# Patient Record
Sex: Male | Born: 1960 | Race: Asian | Hispanic: No | Marital: Married | Smoking: Former smoker
Health system: Southern US, Community
[De-identification: ages and names within clinical notes are randomized; demographics above are authoritative.]

## PROBLEM LIST (undated history)

## (undated) DIAGNOSIS — K219 Gastro-esophageal reflux disease without esophagitis: Secondary | ICD-10-CM

## (undated) DIAGNOSIS — I2510 Atherosclerotic heart disease of native coronary artery without angina pectoris: Secondary | ICD-10-CM

## (undated) DIAGNOSIS — D72829 Elevated white blood cell count, unspecified: Secondary | ICD-10-CM

## (undated) DIAGNOSIS — J449 Chronic obstructive pulmonary disease, unspecified: Secondary | ICD-10-CM

## (undated) DIAGNOSIS — D509 Iron deficiency anemia, unspecified: Secondary | ICD-10-CM

## (undated) DIAGNOSIS — I255 Ischemic cardiomyopathy: Secondary | ICD-10-CM

## (undated) DIAGNOSIS — E041 Nontoxic single thyroid nodule: Secondary | ICD-10-CM

## (undated) DIAGNOSIS — J45909 Unspecified asthma, uncomplicated: Secondary | ICD-10-CM

## (undated) DIAGNOSIS — H269 Unspecified cataract: Secondary | ICD-10-CM

## (undated) DIAGNOSIS — I219 Acute myocardial infarction, unspecified: Secondary | ICD-10-CM

## (undated) DIAGNOSIS — E785 Hyperlipidemia, unspecified: Secondary | ICD-10-CM

## (undated) DIAGNOSIS — L03115 Cellulitis of right lower limb: Secondary | ICD-10-CM

## (undated) DIAGNOSIS — I10 Essential (primary) hypertension: Secondary | ICD-10-CM

## (undated) HISTORY — DX: Elevated white blood cell count, unspecified: D72.829

## (undated) HISTORY — DX: Nontoxic single thyroid nodule: E04.1

## (undated) HISTORY — DX: Hyperlipidemia, unspecified: E78.5

## (undated) HISTORY — DX: Ischemic cardiomyopathy: I25.5

## (undated) HISTORY — PX: CORONARY STENT PLACEMENT: SHX1402

## (undated) HISTORY — DX: Iron deficiency anemia, unspecified: D50.9

## (undated) HISTORY — PX: OTHER SURGICAL HISTORY: SHX169

## (undated) HISTORY — DX: Unspecified cataract: H26.9

## (undated) HISTORY — DX: Unspecified asthma, uncomplicated: J45.909

## (undated) HISTORY — DX: Chronic obstructive pulmonary disease, unspecified: J44.9

## (undated) HISTORY — DX: Gastro-esophageal reflux disease without esophagitis: K21.9

## (undated) HISTORY — DX: Atherosclerotic heart disease of native coronary artery without angina pectoris: I25.10

## (undated) HISTORY — DX: Essential (primary) hypertension: I10

---

## 2000-03-29 ENCOUNTER — Emergency Department (HOSPITAL_COMMUNITY): Admission: EM | Admit: 2000-03-29 | Discharge: 2000-03-29 | Payer: Self-pay | Admitting: Emergency Medicine

## 2000-03-29 ENCOUNTER — Encounter: Payer: Self-pay | Admitting: *Deleted

## 2004-04-24 ENCOUNTER — Ambulatory Visit: Payer: Self-pay | Admitting: Family Medicine

## 2004-05-28 ENCOUNTER — Ambulatory Visit: Payer: Self-pay | Admitting: Family Medicine

## 2004-06-17 ENCOUNTER — Ambulatory Visit: Payer: Self-pay | Admitting: Family Medicine

## 2004-06-21 ENCOUNTER — Ambulatory Visit (HOSPITAL_COMMUNITY): Admission: RE | Admit: 2004-06-21 | Discharge: 2004-06-21 | Payer: Self-pay | Admitting: Otolaryngology

## 2004-06-21 ENCOUNTER — Ambulatory Visit (HOSPITAL_COMMUNITY): Admission: RE | Admit: 2004-06-21 | Discharge: 2004-06-21 | Payer: Self-pay | Admitting: Family Medicine

## 2004-06-21 ENCOUNTER — Ambulatory Visit: Payer: Self-pay | Admitting: Family Medicine

## 2004-06-24 ENCOUNTER — Ambulatory Visit: Payer: Self-pay | Admitting: Family Medicine

## 2004-07-04 ENCOUNTER — Emergency Department (HOSPITAL_COMMUNITY): Admission: EM | Admit: 2004-07-04 | Discharge: 2004-07-04 | Payer: Self-pay | Admitting: Emergency Medicine

## 2004-07-09 ENCOUNTER — Ambulatory Visit: Payer: Self-pay | Admitting: Sports Medicine

## 2004-08-12 ENCOUNTER — Ambulatory Visit: Payer: Self-pay | Admitting: Sports Medicine

## 2004-08-27 ENCOUNTER — Emergency Department (HOSPITAL_COMMUNITY): Admission: EM | Admit: 2004-08-27 | Discharge: 2004-08-27 | Payer: Self-pay | Admitting: Emergency Medicine

## 2004-09-05 ENCOUNTER — Ambulatory Visit: Payer: Self-pay | Admitting: Family Medicine

## 2004-09-13 ENCOUNTER — Ambulatory Visit (HOSPITAL_COMMUNITY): Admission: RE | Admit: 2004-09-13 | Discharge: 2004-09-13 | Payer: Self-pay

## 2004-09-27 ENCOUNTER — Ambulatory Visit: Payer: Self-pay | Admitting: Family Medicine

## 2004-10-07 ENCOUNTER — Ambulatory Visit (HOSPITAL_COMMUNITY): Admission: RE | Admit: 2004-10-07 | Discharge: 2004-10-07 | Payer: Self-pay | Admitting: Sports Medicine

## 2004-10-10 ENCOUNTER — Ambulatory Visit (HOSPITAL_COMMUNITY): Admission: RE | Admit: 2004-10-10 | Discharge: 2004-10-10 | Payer: Self-pay | Admitting: General Surgery

## 2004-11-06 ENCOUNTER — Ambulatory Visit: Payer: Self-pay | Admitting: Family Medicine

## 2004-11-13 ENCOUNTER — Encounter: Admission: RE | Admit: 2004-11-13 | Discharge: 2004-11-13 | Payer: Self-pay | Admitting: General Surgery

## 2005-05-13 ENCOUNTER — Ambulatory Visit: Payer: Self-pay | Admitting: Family Medicine

## 2005-05-21 ENCOUNTER — Inpatient Hospital Stay (HOSPITAL_COMMUNITY): Admission: EM | Admit: 2005-05-21 | Discharge: 2005-05-24 | Payer: Self-pay | Admitting: Emergency Medicine

## 2005-05-21 ENCOUNTER — Ambulatory Visit: Payer: Self-pay | Admitting: Family Medicine

## 2005-05-30 ENCOUNTER — Ambulatory Visit: Payer: Self-pay | Admitting: Family Medicine

## 2005-08-08 ENCOUNTER — Ambulatory Visit: Payer: Self-pay | Admitting: Family Medicine
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
## 2005-08-22 ENCOUNTER — Ambulatory Visit: Payer: Self-pay | Admitting: Critical Care Medicine

## 2005-09-01 ENCOUNTER — Ambulatory Visit: Payer: Self-pay | Admitting: Cardiology

## 2005-09-03 ENCOUNTER — Ambulatory Visit: Payer: Self-pay | Admitting: Critical Care Medicine

## 2005-09-22 ENCOUNTER — Ambulatory Visit: Payer: Self-pay | Admitting: Family Medicine

## 2005-09-22 ENCOUNTER — Encounter: Payer: Self-pay | Admitting: Critical Care Medicine

## 2005-10-08 ENCOUNTER — Ambulatory Visit: Payer: Self-pay | Admitting: Critical Care Medicine

## 2005-11-26 ENCOUNTER — Ambulatory Visit: Payer: Self-pay | Admitting: Critical Care Medicine

## 2006-01-01 ENCOUNTER — Ambulatory Visit: Payer: Self-pay | Admitting: Critical Care Medicine

## 2006-01-14 ENCOUNTER — Ambulatory Visit: Payer: Self-pay | Admitting: Family Medicine

## 2006-02-02 ENCOUNTER — Ambulatory Visit: Payer: Self-pay | Admitting: Critical Care Medicine

## 2006-02-03 ENCOUNTER — Ambulatory Visit: Payer: Self-pay | Admitting: Family Medicine

## 2006-02-13 ENCOUNTER — Ambulatory Visit: Payer: Self-pay | Admitting: Family Medicine

## 2006-02-18 ENCOUNTER — Ambulatory Visit: Payer: Self-pay | Admitting: Critical Care Medicine

## 2006-02-18 ENCOUNTER — Ambulatory Visit: Payer: Self-pay | Admitting: Cardiology

## 2006-03-03 ENCOUNTER — Encounter: Admission: RE | Admit: 2006-03-03 | Discharge: 2006-03-03 | Payer: Self-pay | Admitting: Otolaryngology

## 2006-03-10 ENCOUNTER — Ambulatory Visit (HOSPITAL_BASED_OUTPATIENT_CLINIC_OR_DEPARTMENT_OTHER): Admission: RE | Admit: 2006-03-10 | Discharge: 2006-03-10 | Payer: Self-pay | Admitting: Otolaryngology

## 2006-03-10 ENCOUNTER — Encounter (INDEPENDENT_AMBULATORY_CARE_PROVIDER_SITE_OTHER): Payer: Self-pay | Admitting: *Deleted

## 2006-03-24 ENCOUNTER — Ambulatory Visit: Payer: Self-pay | Admitting: Sports Medicine

## 2006-04-03 ENCOUNTER — Ambulatory Visit: Payer: Self-pay | Admitting: Critical Care Medicine

## 2006-04-21 DIAGNOSIS — I219 Acute myocardial infarction, unspecified: Secondary | ICD-10-CM

## 2006-04-21 HISTORY — DX: Acute myocardial infarction, unspecified: I21.9

## 2006-05-01 ENCOUNTER — Ambulatory Visit: Payer: Self-pay | Admitting: Critical Care Medicine

## 2006-05-15 ENCOUNTER — Ambulatory Visit: Payer: Self-pay | Admitting: Critical Care Medicine

## 2006-06-01 ENCOUNTER — Ambulatory Visit: Payer: Self-pay | Admitting: Critical Care Medicine

## 2006-06-18 DIAGNOSIS — E781 Pure hyperglyceridemia: Secondary | ICD-10-CM | POA: Insufficient documentation

## 2006-06-18 DIAGNOSIS — J329 Chronic sinusitis, unspecified: Secondary | ICD-10-CM | POA: Insufficient documentation

## 2006-07-01 ENCOUNTER — Ambulatory Visit: Payer: Self-pay | Admitting: Pulmonary Disease

## 2006-07-31 ENCOUNTER — Ambulatory Visit: Payer: Self-pay | Admitting: Pulmonary Disease

## 2006-11-19 ENCOUNTER — Ambulatory Visit: Payer: Self-pay | Admitting: Internal Medicine

## 2006-11-27 ENCOUNTER — Ambulatory Visit: Payer: Self-pay | Admitting: Critical Care Medicine

## 2007-02-16 ENCOUNTER — Encounter: Payer: Self-pay | Admitting: Internal Medicine

## 2007-02-18 ENCOUNTER — Ambulatory Visit: Payer: Self-pay | Admitting: Cardiovascular Disease

## 2007-02-18 ENCOUNTER — Inpatient Hospital Stay (HOSPITAL_COMMUNITY): Admission: EM | Admit: 2007-02-18 | Discharge: 2007-02-23 | Payer: Self-pay | Admitting: Emergency Medicine

## 2007-02-18 ENCOUNTER — Ambulatory Visit: Payer: Self-pay | Admitting: Internal Medicine

## 2007-02-19 ENCOUNTER — Encounter: Payer: Self-pay | Admitting: Cardiology

## 2007-03-08 ENCOUNTER — Ambulatory Visit: Payer: Self-pay | Admitting: Critical Care Medicine

## 2007-03-10 ENCOUNTER — Ambulatory Visit: Payer: Self-pay | Admitting: Cardiology

## 2007-03-11 ENCOUNTER — Encounter (HOSPITAL_COMMUNITY): Admission: RE | Admit: 2007-03-11 | Discharge: 2007-04-21 | Payer: Self-pay | Admitting: Cardiology

## 2007-04-01 ENCOUNTER — Ambulatory Visit: Payer: Self-pay | Admitting: Critical Care Medicine

## 2007-04-01 DIAGNOSIS — I252 Old myocardial infarction: Secondary | ICD-10-CM | POA: Insufficient documentation

## 2007-04-07 ENCOUNTER — Ambulatory Visit: Payer: Self-pay | Admitting: Cardiology

## 2007-04-07 LAB — CONVERTED CEMR LAB
AST: 22 units/L (ref 0–37)
Albumin: 3.7 g/dL (ref 3.5–5.2)
BUN: 6 mg/dL (ref 6–23)
Calcium: 9.1 mg/dL (ref 8.4–10.5)
Cholesterol: 103 mg/dL (ref 0–200)
Creatinine, Ser: 1.1 mg/dL (ref 0.4–1.5)
Glucose, Bld: 98 mg/dL (ref 70–99)
Sodium: 143 meq/L (ref 135–145)
Total Bilirubin: 0.8 mg/dL (ref 0.3–1.2)
Total Protein: 6.6 g/dL (ref 6.0–8.3)

## 2007-04-08 ENCOUNTER — Ambulatory Visit: Payer: Self-pay | Admitting: Critical Care Medicine

## 2007-04-21 ENCOUNTER — Ambulatory Visit: Payer: Self-pay | Admitting: Cardiology

## 2007-04-22 ENCOUNTER — Encounter (HOSPITAL_COMMUNITY): Admission: RE | Admit: 2007-04-22 | Discharge: 2007-07-21 | Payer: Self-pay | Admitting: Cardiology

## 2007-05-13 ENCOUNTER — Ambulatory Visit: Payer: Self-pay | Admitting: Internal Medicine

## 2007-06-14 ENCOUNTER — Ambulatory Visit: Payer: Self-pay | Admitting: Critical Care Medicine

## 2007-06-23 ENCOUNTER — Emergency Department (HOSPITAL_COMMUNITY): Admission: EM | Admit: 2007-06-23 | Discharge: 2007-06-23 | Payer: Self-pay | Admitting: Family Medicine

## 2007-07-08 ENCOUNTER — Ambulatory Visit: Payer: Self-pay | Admitting: Critical Care Medicine

## 2007-07-08 ENCOUNTER — Encounter (INDEPENDENT_AMBULATORY_CARE_PROVIDER_SITE_OTHER): Payer: Self-pay | Admitting: Family Medicine

## 2007-07-13 ENCOUNTER — Ambulatory Visit: Payer: Self-pay | Admitting: Critical Care Medicine

## 2007-08-13 ENCOUNTER — Ambulatory Visit: Payer: Self-pay | Admitting: Critical Care Medicine

## 2007-08-13 ENCOUNTER — Ambulatory Visit: Payer: Self-pay | Admitting: Internal Medicine

## 2007-10-25 ENCOUNTER — Ambulatory Visit: Payer: Self-pay | Admitting: Pulmonary Disease

## 2007-11-22 ENCOUNTER — Ambulatory Visit: Payer: Self-pay | Admitting: Critical Care Medicine

## 2007-12-14 ENCOUNTER — Ambulatory Visit: Payer: Self-pay | Admitting: Critical Care Medicine

## 2007-12-23 ENCOUNTER — Ambulatory Visit: Payer: Self-pay | Admitting: Critical Care Medicine

## 2007-12-30 ENCOUNTER — Emergency Department (HOSPITAL_COMMUNITY): Admission: EM | Admit: 2007-12-30 | Discharge: 2007-12-30 | Payer: Self-pay | Admitting: Family Medicine

## 2008-01-20 ENCOUNTER — Ambulatory Visit: Payer: Self-pay | Admitting: Critical Care Medicine

## 2008-02-16 ENCOUNTER — Ambulatory Visit: Payer: Self-pay | Admitting: Critical Care Medicine

## 2008-05-24 ENCOUNTER — Ambulatory Visit: Payer: Self-pay | Admitting: Critical Care Medicine

## 2008-05-31 ENCOUNTER — Telehealth (INDEPENDENT_AMBULATORY_CARE_PROVIDER_SITE_OTHER): Payer: Self-pay | Admitting: *Deleted

## 2008-06-02 ENCOUNTER — Ambulatory Visit: Payer: Self-pay | Admitting: Internal Medicine

## 2008-06-21 ENCOUNTER — Ambulatory Visit: Payer: Self-pay | Admitting: Critical Care Medicine

## 2008-06-21 ENCOUNTER — Telehealth: Payer: Self-pay | Admitting: Critical Care Medicine

## 2008-07-13 DIAGNOSIS — I2510 Atherosclerotic heart disease of native coronary artery without angina pectoris: Secondary | ICD-10-CM | POA: Insufficient documentation

## 2008-07-14 ENCOUNTER — Ambulatory Visit: Payer: Self-pay | Admitting: Cardiology

## 2008-07-14 ENCOUNTER — Encounter: Payer: Self-pay | Admitting: Cardiology

## 2008-07-19 ENCOUNTER — Ambulatory Visit: Payer: Self-pay | Admitting: Critical Care Medicine

## 2008-08-04 ENCOUNTER — Encounter: Payer: Self-pay | Admitting: Cardiology

## 2008-08-04 ENCOUNTER — Ambulatory Visit: Payer: Self-pay

## 2008-08-07 ENCOUNTER — Ambulatory Visit: Payer: Self-pay | Admitting: Critical Care Medicine

## 2008-08-16 ENCOUNTER — Ambulatory Visit: Payer: Self-pay | Admitting: Critical Care Medicine

## 2008-08-25 ENCOUNTER — Ambulatory Visit: Payer: Self-pay | Admitting: Family Medicine

## 2008-09-19 ENCOUNTER — Ambulatory Visit: Payer: Self-pay | Admitting: Critical Care Medicine

## 2008-10-11 ENCOUNTER — Ambulatory Visit: Payer: Self-pay | Admitting: Critical Care Medicine

## 2008-10-11 DIAGNOSIS — H679 Otitis media in diseases classified elsewhere, unspecified ear: Secondary | ICD-10-CM | POA: Insufficient documentation

## 2008-10-18 ENCOUNTER — Ambulatory Visit: Payer: Self-pay | Admitting: Critical Care Medicine

## 2008-11-29 ENCOUNTER — Ambulatory Visit: Payer: Self-pay | Admitting: Critical Care Medicine

## 2008-12-01 ENCOUNTER — Ambulatory Visit: Payer: Self-pay | Admitting: Critical Care Medicine

## 2008-12-19 ENCOUNTER — Ambulatory Visit: Payer: Self-pay | Admitting: Critical Care Medicine

## 2008-12-27 ENCOUNTER — Encounter: Payer: Self-pay | Admitting: Critical Care Medicine

## 2009-01-10 ENCOUNTER — Ambulatory Visit: Payer: Self-pay | Admitting: Critical Care Medicine

## 2009-01-30 ENCOUNTER — Ambulatory Visit: Payer: Self-pay | Admitting: Critical Care Medicine

## 2009-02-13 ENCOUNTER — Ambulatory Visit: Payer: Self-pay | Admitting: Critical Care Medicine

## 2009-03-20 ENCOUNTER — Telehealth (INDEPENDENT_AMBULATORY_CARE_PROVIDER_SITE_OTHER): Payer: Self-pay | Admitting: *Deleted

## 2009-03-21 ENCOUNTER — Ambulatory Visit: Payer: Self-pay | Admitting: Critical Care Medicine

## 2009-03-22 ENCOUNTER — Encounter: Payer: Self-pay | Admitting: Cardiology

## 2009-03-26 ENCOUNTER — Ambulatory Visit (HOSPITAL_COMMUNITY): Admission: RE | Admit: 2009-03-26 | Discharge: 2009-03-26 | Payer: Self-pay | Admitting: Otolaryngology

## 2009-03-27 ENCOUNTER — Telehealth: Payer: Self-pay | Admitting: Cardiology

## 2009-03-29 ENCOUNTER — Telehealth: Payer: Self-pay | Admitting: Cardiology

## 2009-04-10 ENCOUNTER — Telehealth: Payer: Self-pay | Admitting: Cardiology

## 2009-04-10 ENCOUNTER — Ambulatory Visit: Payer: Self-pay | Admitting: Cardiology

## 2009-04-30 ENCOUNTER — Telehealth (INDEPENDENT_AMBULATORY_CARE_PROVIDER_SITE_OTHER): Payer: Self-pay | Admitting: *Deleted

## 2009-05-01 ENCOUNTER — Ambulatory Visit: Payer: Self-pay | Admitting: Cardiology

## 2009-05-01 ENCOUNTER — Ambulatory Visit: Payer: Self-pay

## 2009-05-01 ENCOUNTER — Encounter (HOSPITAL_COMMUNITY): Admission: RE | Admit: 2009-05-01 | Discharge: 2009-06-25 | Payer: Self-pay | Admitting: Cardiology

## 2009-05-01 ENCOUNTER — Ambulatory Visit: Payer: Self-pay | Admitting: Internal Medicine

## 2009-05-04 LAB — CONVERTED CEMR LAB
Alkaline Phosphatase: 59 units/L (ref 39–117)
BUN: 15 mg/dL (ref 6–23)
Basophils Absolute: 0 10*3/uL (ref 0.0–0.1)
Bilirubin, Direct: 0.2 mg/dL (ref 0.0–0.3)
CO2: 17 meq/L — ABNORMAL LOW (ref 19–32)
Eosinophils Relative: 0 % (ref 0–5)
Glucose, Bld: 128 mg/dL — ABNORMAL HIGH (ref 70–99)
HCT: 40 % (ref 39.0–52.0)
Indirect Bilirubin: 0.2 mg/dL (ref 0.0–0.9)
LDL Cholesterol: 42 mg/dL (ref 0–99)
Lymphocytes Relative: 9 % — ABNORMAL LOW (ref 12–46)
Lymphs Abs: 1.3 10*3/uL (ref 0.7–4.0)
MCV: 74.6 fL — ABNORMAL LOW (ref 78.0–100.0)
Monocytes Absolute: 0.6 10*3/uL (ref 0.1–1.0)
Monocytes Relative: 4 % (ref 3–12)
Neutro Abs: 12.6 10*3/uL — ABNORMAL HIGH (ref 1.7–7.7)
Platelets: 265 10*3/uL (ref 150–400)
Potassium: 4.5 meq/L (ref 3.5–5.3)
RBC: 5.36 M/uL (ref 4.22–5.81)
RDW: 13.1 % (ref 11.5–15.5)
Sodium: 143 meq/L (ref 135–145)
Total Bilirubin: 0.4 mg/dL (ref 0.3–1.2)
Total Protein: 7 g/dL (ref 6.0–8.3)
Triglycerides: 52 mg/dL (ref <150)
VLDL: 10 mg/dL (ref 0–40)
WBC: 14.5 10*3/uL — ABNORMAL HIGH (ref 4.0–10.5)

## 2009-05-11 ENCOUNTER — Ambulatory Visit: Payer: Self-pay | Admitting: Critical Care Medicine

## 2009-06-12 ENCOUNTER — Ambulatory Visit: Payer: Self-pay | Admitting: Family Medicine

## 2009-06-12 DIAGNOSIS — E118 Type 2 diabetes mellitus with unspecified complications: Secondary | ICD-10-CM | POA: Insufficient documentation

## 2009-06-12 DIAGNOSIS — E1165 Type 2 diabetes mellitus with hyperglycemia: Secondary | ICD-10-CM

## 2009-06-19 ENCOUNTER — Encounter: Payer: Self-pay | Admitting: Family Medicine

## 2009-07-24 ENCOUNTER — Ambulatory Visit: Payer: Self-pay | Admitting: Cardiology

## 2009-07-24 ENCOUNTER — Emergency Department (HOSPITAL_COMMUNITY): Admission: EM | Admit: 2009-07-24 | Discharge: 2009-07-24 | Payer: Self-pay | Admitting: Emergency Medicine

## 2009-07-24 ENCOUNTER — Emergency Department (HOSPITAL_COMMUNITY): Admission: EM | Admit: 2009-07-24 | Discharge: 2009-07-24 | Payer: Self-pay | Admitting: Cardiology

## 2009-07-25 ENCOUNTER — Ambulatory Visit: Payer: Self-pay | Admitting: Critical Care Medicine

## 2009-07-27 ENCOUNTER — Ambulatory Visit: Payer: Self-pay | Admitting: Critical Care Medicine

## 2009-09-05 ENCOUNTER — Ambulatory Visit: Payer: Self-pay | Admitting: Family Medicine

## 2009-09-05 DIAGNOSIS — S61409A Unspecified open wound of unspecified hand, initial encounter: Secondary | ICD-10-CM | POA: Insufficient documentation

## 2009-09-05 LAB — CONVERTED CEMR LAB: Hgb A1c MFr Bld: 9.2 %

## 2009-09-27 ENCOUNTER — Ambulatory Visit: Payer: Self-pay | Admitting: Cardiology

## 2010-01-21 ENCOUNTER — Encounter: Payer: Self-pay | Admitting: Family Medicine

## 2010-01-30 ENCOUNTER — Encounter: Payer: Self-pay | Admitting: Family Medicine

## 2010-01-30 DIAGNOSIS — J45909 Unspecified asthma, uncomplicated: Secondary | ICD-10-CM | POA: Insufficient documentation

## 2010-04-23 ENCOUNTER — Ambulatory Visit
Admission: RE | Admit: 2010-04-23 | Discharge: 2010-04-23 | Payer: Self-pay | Source: Home / Self Care | Attending: Critical Care Medicine | Admitting: Critical Care Medicine

## 2010-05-12 ENCOUNTER — Encounter: Payer: Self-pay | Admitting: Family Medicine

## 2010-05-12 ENCOUNTER — Encounter: Payer: Self-pay | Admitting: Otolaryngology

## 2010-05-21 NOTE — Letter (Signed)
Summary: [facility]   Imported By: Roderic Ovens 05/10/2009 14:13:29  _____________________________________________________________________  External Attachment:    Type:   Image     Comment:   External Document

## 2010-05-21 NOTE — Miscellaneous (Signed)
Summary: Re: ophthmology referral   Clinical Lists Changes received notification from [facility] that they are unable to complete the referral for ophthmology  due to lack of volunteer physicians in this speciality group at this time. they will notify patient when they can process this referral. Theresia Lo RN  June 19, 2009 2:58 PM

## 2010-05-21 NOTE — Assessment & Plan Note (Signed)
Summary: Cardiology Nuclear Study  Nuclear Med Background Indications for Stress Test: Evaluation for Ischemia, Surgical Clearance, Stent Patency  Indications Comments: Pending Ear surgery- Dr. Ermalinda Barrios  History: Abnormal EKG, Asthma, Heart Catheterization, Myocardial Infarction, Stents  History Comments: '08 MI: AWMI > Stent: LAD 02/19/07 Heart Cath: EF= 40-45%, patent LAD stent 4/10 Echo: EF=45%  Symptoms: DOE, Palpitations, Rapid HR, SOB    Nuclear Pre-Procedure Cardiac Risk Factors: Family History - CAD, Lipids Caffeine/Decaff Intake: None NPO After: 9:30 PM Lungs: clear IV 0.9% NS with Angio Cath: 22g     IV Site: (R) Hand IV Started by: Irean Hong RN Chest Size (in) 42     Height (in): 68 Weight (lb): 172 BMI: 26.25 Tech Comments: During the exercise portion of the test, the pateint became very SOB and had audible wheezeing throughout. The patient was given Albuterol 5.0 mg via nebulizer. After the Tx, the patient's lungs were clear with an O2 sat 98%.   Nuclear Med Study 1 or 2 day study:  1 day     Stress Test Type:  Stress Reading MD:  Arvilla Meres, MD     Referring MD:  B.Brodie Resting Radionuclide:  Technetium 47m Tetrofosmin     Resting Radionuclide Dose:  11.0 mCi  Stress Radionuclide:  Technetium 67m Tetrofosmin     Stress Radionuclide Dose:  33.0 mCi   Stress Protocol Exercise Time (min):  7:08 min     Max HR:  157 bpm     Predicted Max HR:  170 bpm  Max Systolic BP: 197 mm Hg     Percent Max HR:  92.35 %     METS: 8.7 Rate Pressure Product:  11914    Stress Test Technologist:  Milana Na EMT-P     Nuclear Technologist:  Harlow Asa CNMT  Rest Procedure  Myocardial perfusion imaging was performed at rest 45 minutes following the intravenous administration of Myoview Technetium 57m Tetrofosmin.  Stress Procedure  The patient exercised for 7;08. The patient stopped due to extreme SOB, fatigue,  and denied any chest pain.  There were no  significant ST-T wave changes.  Myoview was injected at peak exercise and myocardial perfusion imaging was performed after a brief delay.  QPS Raw Data Images:  Normal; no motion artifact; normal heart/lung ratio. Stress Images:  There is no uptake in the mid to distal anterior wall, anteroseptum, apex and distal inferior wall Rest Images:  There is no uptake in the mid to distal anterior wall, anteroseptum, apex and distal inferior wall Subtraction (SDS):  Previous anterior, apical and distal inferior MI. No signficant ischemia. Transient Ischemic Dilatation:  1.06  (Normal <1.22)  Lung/Heart Ratio:  .34  (Normal <0.45)  Quantitative Gated Spect Images QGS EDV:  120 ml QGS ESV:  72 ml QGS EF:  40 % QGS cine images:  Previous anterior, apical and distal inferior MI with aneurysmal dilation.   Findings Abnormal nuclear study  Evidence for inferior infarct  Evidence for LV Dysfunction LV Dysfunction    Overall Impression  Exercise Capacity: Fair exercise capacity. BP Response: Hypertensive blood pressure response. Clinical Symptoms: Extreme fatigue ECG Impression: No significant ST segment change suggestive of ischemia. Overall Impression: Abnormal stress nuclear study. Overall Impression Comments: Previous anterior, apical and distal inferior MI. No signficant ischemia. Increased RV traced uptake suggestive of elevated R-sided pressures.  Appended Document: Cardiology Nuclear Study Addendum: Symptoms included dyspnea and wheezing. Given albuterol inhaler. O2 sat by pulse ox did not go below 93%.  Appended Document: Cardiology Nuclear Beather Arbour, I think we can clear him for ear surgery under general anesthesia.  He can stop plavix 5 days before surgery. He needs stress does of steroids before surgery. BB  Appended Document: Cardiology Nuclear Study Pt aware of results. I will fax results to Dr. Dorma Russell.

## 2010-05-21 NOTE — Miscellaneous (Signed)
   Clinical Lists Changes  Problems: Changed problem from EXTRINSIC ASTHMA, UNSPECIFIED (ICD-493.00) to ASTHMA, PERSISTENT (ICD-493.90)
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
## 2010-05-21 NOTE — Miscellaneous (Signed)
Summary: Orders Update  Clinical Lists Changes  Orders: Added new Test order of T-Basic Metabolic Panel [phone]) - Signed Added new Test order of T-CBC w/Diff [phone]) - Signed Added new Test order of T-Hepatic Function [phone]) - Signed Added new Test order of T-Lipid Profile ([id number]) - Signed

## 2010-05-21 NOTE — Progress Notes (Signed)
Summary: Nuclear Pre-Procedure  Phone Note Outgoing Call Call back at [facility] Phone [phone]   Call placed by: Stanton Kidney, EMT-P,  April 30, 2009 1:57 PM Action Taken: Phone Call Completed Summary of Call: Reviewed information on Myoview Information Sheet (see scanned document for further details).  Spoke with Patient's son.  Also advised cell # [phone], if needed.     Nuclear Med Background Indications for Stress Test: Evaluation for Ischemia, Surgical Clearance, Stent Patency  Indications Comments: Pending Ear surgery- Dr. Ermalinda Barrios  History: Abnormal EKG, Asthma, Heart Catheterization, Myocardial Infarction, Stents  History Comments: '08 MI: AWMI > Stent: LAD 02/19/07 Heart Cath: EF= 40-45%, patent LAD stent 4/10 Echo: EF=45%     Nuclear Pre-Procedure Cardiac Risk Factors: Family History - CAD, Lipids Height (in): 68

## 2010-05-21 NOTE — Assessment & Plan Note (Signed)
Summary: xolair/apc   Nurse Visit   Allergies: No Known Drug Allergies  Medication Administration  Injection # 1:    Medication: Xolair (omalizumab) 150mg     Diagnosis: EXTRINSIC ASTHMA, UNSPECIFIED (ICD-493.00)    Route: SQ    Site: R deltoid    Exp Date: 08/19/2012    Lot #: [id number]    Mfr: GENENTECH    Comments: 1.2ML X RIGHT ARM AND 1.2ML IN LEFT ARM PT WAITED 20 MINS    Patient tolerated injection without complications    Given by: SUSANNE FORD IN [facility]  Orders Added: 1)  Xolair (omalizumab) 150mg  [J2357] 2)  Administration xolair injection [[id number]]   Medication Administration  Injection # 1:    Medication: Xolair (omalizumab) 150mg     Diagnosis: EXTRINSIC ASTHMA, UNSPECIFIED (ICD-493.00)    Route: SQ    Site: R deltoid    Exp Date: 08/19/2012    Lot #: [id number]    Mfr: GENENTECH    Comments: 1.2ML X RIGHT ARM AND 1.2ML IN LEFT ARM PT WAITED 20 MINS    Patient tolerated injection without complications    Given by: SUSANNE FORD IN [facility]  Orders Added: 1)  Xolair (omalizumab) 150mg  [J2357] 2)  Administration xolair injection [[id number]]
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
## 2010-05-21 NOTE — Assessment & Plan Note (Signed)
Summary: BLOOD SUGARS HIGH/KH   Vital Signs:  Patient profile:   50 year old male Height:      68 inches Weight:      173 pounds BMI:     26.40 BSA:     1.92 Temp:     98.1 degrees F Pulse rate:   90 / minute BP sitting:   121 / 88  Vitals Entered By: Jone Baseman CMA (June 12, 2009 8:49 AM) CC: blood sugars running high Is Patient Diabetic? No Pain Assessment Patient in pain? no        Primary Care Provider:  Dr. Mannie Stabile  CC:  blood sugars running high.  History of Present Illness: 50 yo male on chronic steroids who has been checking his blood sugars the past 1-1/2 months and notices they are running high.  His morning fasting CBG is 150-160, and his postprandials are 220-240.  He is currently taking Prednisone 20 mg by mouth daily for asthma, duration 4-5 years.  He also complains of bilateral ear fullness and drainage x1 week.  Hearing is normal.  ROS:  Negative for polyuria, polydipsia, fever, otalgia.  Habits & Providers  Alcohol-Tobacco-Diet     Tobacco Status: never  Allergies (verified): No Known Drug Allergies  Physical Exam  General:  Well-developed,well-nourished,in no acute distress; alert,appropriate and cooperative throughout examination Ears:  Bilateral thick discharge with mild tenderness, TM intact Lungs:  Normal respiratory effort, chest expands symmetrically. Lungs are clear to auscultation, no crackles or wheezes. Heart:  Normal rate and regular rhythm. S1 and S2 normal without gallop, murmur, click, rub or other extra sounds. Skin:  Acanthosis nigricans   Impression & Recommendations:  Problem # 1:  DIABETES MELLITUS, STEROID-INDUCED (ICD-251.8) Assessment New Will start Meformin 500 mg by mouth daily--patient advised of side effects and to keep taking if GI upset, it should subside. Record CBGs daily x1 month.    RTC 1 month w/ Dr. Sharen Hones. Referred to ophtho.    Check A1c today. LDL 42 (01/11) on Lipitor 80.    Cr 0.94  (01/11).  Problem # 2:  OTITIS EXTERNA, ACUTE, BILATERAL (ICD-380.12) Assessment: New  Cortisporin 3.5-10000-1 Soln (Neomycin-polymyxin-hc) .Marland KitchenMarland KitchenMarland KitchenMarland Kitchen 4 drops each ear 3-4 times per day.  disp #1 bottle.  Orders: [facility]- Est  Level 4 (47425)  Complete Medication List: 1)  Xolair 150 Mg Solr (Omalizumab) .... 300mg  subcutaneously monthly 2)  Flonase 50 Mcg/act Susp (Fluticasone propionate) .... Two puffs each nostril daily 3)  Lipitor 80 Mg Tabs (Atorvastatin calcium) .... One by mouth once daily 4)  Plavix 75 Mg Tabs (Clopidogrel bisulfate) .Marland Kitchen.. 1 by mouth daily 5)  Benicar 20 Mg Tabs (Olmesartan medoxomil) .... One tablet by mouth daily 6)  Adult Aspirin Ec Low Strength 81 Mg Tbec (Aspirin) .... One by mouth once daily 7)  Advair Diskus 250-50 Mcg/dose Misc (Fluticasone-salmeterol) .... Use one spray two times a day 8)  Proair Hfa 108 (90 Base) Mcg/act Aers (Albuterol sulfate) .Marland Kitchen.. 1-2 puffs every 4-6 hours as needed 9)  Calcium Carbonate-vitamin D 600-400 Mg-unit Tabs (Calcium carbonate-vitamin d) .... Take 1 tablet by mouth once a day 10)  Prednisone 10 Mg Tabs (Prednisone) .Marland Kitchen.. 1 by mouth daily 11)  Albuterol Sulfate (2.5 Mg/36ml) 0.083% Nebu (Albuterol sulfate) .Marland Kitchen.. 1 vial via hhn every 4-6 hr as needed 12)  Coreg 6.25 Mg Tabs (Carvedilol) .Marland Kitchen.. 1 by mouth two times a day 13)  Metformin Hcl 500 Mg Tabs (Metformin hcl) .Marland Kitchen.. 1 tab by mouth daily for diabetes 14)  Cortisporin 3.5-10000-1 Soln (Neomycin-polymyxin-hc) .... 4 drops each ear 3-4 times per day.  disp #1 bottle.  Other Orders: Glucose Cap-[facility] (60630) Ophthalmology Referral (Ophthalmology) A1C-[facility] [phone])  Patient Instructions: 1)  You have diabetes, which is suspect is due to your steroid use.  We will start by treating with Metformin 500 mg by mouth daily.  Continue to monitor your blood sugar every morning and record these to bring to your next visit. 2)  We are arranging a visit to the eye doctor for you--this may take some  time. 3)  I have also prescribed drops for an external ear infection--take as directed. 4)  Please schedule a follow-up appointment in 1 month with Dr. Sharen Hones.  Prescriptions: CORTISPORIN 3.5-10000-1 SOLN (NEOMYCIN-POLYMYXIN-HC) 4 drops each ear 3-4 times per day.  Disp #1 bottle.  #1 x 0   Entered and Authorized by:   Romero Belling MD   Signed by:   Romero Belling MD on 06/12/2009   Method used:   Faxed to ...       [facility] Department (retail)       [address] [place], [place]  [address]       Ph: [phone]       Fax: [phone]   RxID:   [id number] METFORMIN HCL 500 MG TABS (METFORMIN HCL) 1 tab by mouth daily for diabetes  #30 x 2   Entered and Authorized by:   Romero Belling MD   Signed by:   Romero Belling MD on 06/12/2009   Method used:   Faxed to ...       [facility] Department (retail)       [address] [place], [place]  [address]       Ph: [phone]       Fax: [phone]   RxID:   [id number]   Appended Document: A1C results     Lab Visit  Laboratory Results   Blood Tests   Date/Time Received: June 12, 2009 9:53 AM  Date/Time Reported: June 12, 2009 11:42 AM   HGBA1C: 10.4%   (Normal Range: Non-Diabetic - 3-6%   Control Diabetic - 6-8%)  Comments: .......test performed by........Marland Kitchen [place], SMA    Orders Today:

## 2010-05-21 NOTE — Assessment & Plan Note (Signed)
Summary: f/u DM   Vital Signs:  Patient profile:   50 year old male Height:      68 inches Weight:      172 pounds BMI:     26.25 BSA:     1.92 Temp:     97.4 degrees F Pulse rate:   76 / minute BP sitting:   114 / 77  Vitals Entered By: Jone Baseman CMA (Sep 05, 2009 4:15 PM) CC: f/u DM Is Patient Diabetic? Yes Did you bring your meter with you today? No Pain Assessment Patient in pain? no        Primary Care Provider:  Eustaquio Boyden  MD  CC:  f/u DM.  History of Present Illness: 50 yo male following up for steroid-induced diabetes.  Started on Metformin 500 daily and was seen in ED on 04/05 for chest pain but discharged home with diagnosis of hyperglycemia and told to take metformin two times a day.  He has been taking Metformin two times a day x1 month and denies abomdinal side effects.  He has been walking, watching his diet and monitoring CBGs two times a day.  He brings chart with him:  morning fasting 93-158, postprandial 142-235.  Both are trending down.  He endorses polyuria and polydipsia, but states he has increased energy since increasing metormin.  Denies chest pain and dyspnea.  Has regular follow up with pulmonology for asthma and cardiology for CAD.  Habits & Providers  Alcohol-Tobacco-Diet     Tobacco Status: quit     Year Quit: 7 years ago  Current Medications (verified): 1)  Xolair 150 Mg  Solr (Omalizumab) .... 300mg  Subcutaneously Monthly 2)  Flonase 50 Mcg/act  Susp (Fluticasone Propionate) .... Two Puffs Each Nostril Daily 3)  Lipitor 80 Mg Tabs (Atorvastatin Calcium) .... One By Mouth Once Daily 4)  Plavix 75 Mg Tabs (Clopidogrel Bisulfate) .Marland Kitchen.. 1 By Mouth Daily 5)  Adult Aspirin Ec Low Strength 81 Mg Tbec (Aspirin) .... One By Mouth Once Daily 6)  Advair Diskus 250-50 Mcg/dose Misc (Fluticasone-Salmeterol) .... Use One Spray Two Times A Day 7)  Calcium Carbonate-Vitamin D 600-400 Mg-Unit  Tabs (Calcium Carbonate-Vitamin D) .... Take 1 Tablet  By Mouth Once A Day 8)  Prednisone 10 Mg Tabs (Prednisone) .... 4 Each Am X3days, 3 X 3days, 2 X 3days, Then One Daily and Stay 9)  Albuterol Sulfate (2.5 Mg/74ml) 0.083% Nebu (Albuterol Sulfate) .Marland Kitchen.. 1 Vial Via Hhn Every 4-6 Hr As Needed 10)  Coreg 6.25 Mg Tabs (Carvedilol) .Marland Kitchen.. 1 By Mouth Two Times A Day 11)  Metformin Hcl 500 Mg Tabs (Metformin Hcl) .Marland Kitchen.. 1 Tab By Mouth Daily For Diabetes 12)  Astepro 0.15 % Soln (Azelastine Hcl) .... Take Two Sprays Each Nostril Daily 13)  Singulair 10 Mg  Tabs (Montelukast Sodium) .... One By Mouth Daily  Allergies: No Known Drug Allergies  Social History: Smoking Status:  quit  Review of Systems       Per HPI.  Physical Exam  Additional Exam:  VITALS:  Reviewed, normal GEN: Alert & oriented, no acute distress NECK: Midline trachea, no masses/thyromegaly, no cervical lymphadenopathy CARDIO: Regular rate and rhythm, no murmurs/rubs/gallops, 2+ bilateral radial pulses RESP: Clear to auscultation, normal work of breathing, no retractions/accessory muscle use EARS:  External ear without significant lesions or deformities.  Clear canals, TM intact bilaterally without bulging, retraction, inflammation or discharge. Hearing grossly normal bilaterally. NOSE:  Nasal mucosa are pink and moist without lesions or exudates. MOUTH:  Oral mucosa and oropharynx without lesions or exudates.    Impression & Recommendations:  Problem # 1:  DIABETES MELLITUS, STEROID-INDUCED (ICD-251.8) Assessment Improved Improved.  A1C is now 9.1.  Will increase Metformin to 1000 mg by mouth two times a day per patient instructions. follow up in 3 months Orders: A1C-[facility] (16[address]) [facility]- Est Level  3 (60454)  Complete Medication List: 1)  Xolair 150 Mg Solr (Omalizumab) .... 300mg  subcutaneously monthly 2)  Flonase 50 Mcg/act Susp (Fluticasone propionate) .... Two puffs each nostril daily 3)  Lipitor 80 Mg Tabs (Atorvastatin calcium) .... One by mouth once daily 4)  Plavix  75 Mg Tabs (Clopidogrel bisulfate) .Marland Kitchen.. 1 by mouth daily 5)  Adult Aspirin Ec Low Strength 81 Mg Tbec (Aspirin) .... One by mouth once daily 6)  Advair Diskus 250-50 Mcg/dose Misc (Fluticasone-salmeterol) .... Use one spray two times a day 7)  Calcium Carbonate-vitamin D 600-400 Mg-unit Tabs (Calcium carbonate-vitamin d) .... Take 1 tablet by mouth once a day 8)  Prednisone 10 Mg Tabs (Prednisone) .... 4 each am x3days, 3 x 3days, 2 x 3days, then one daily and stay 9)  Albuterol Sulfate (2.5 Mg/8ml) 0.083% Nebu (Albuterol sulfate) .Marland Kitchen.. 1 vial via hhn every 4-6 hr as needed 10)  Coreg 6.25 Mg Tabs (Carvedilol) .Marland Kitchen.. 1 by mouth two times a day 11)  Metformin Hcl 1000 Mg Tabs (Metformin hcl) .Marland Kitchen.. 1 tab by mouth two times a day 12)  Astepro 0.15 % Soln (Azelastine hcl) .... Take two sprays each nostril daily 13)  Singulair 10 Mg Tabs (Montelukast sodium) .... One by mouth daily  Patient Instructions: 1)  Good to see you today. 2)  Start taking Metformin 1000 mg by mouth every morning and 500 mg by mouth every evening for 1 week.  Then take 1000 mg by mouth two times a day every day. 3)  Please schedule a follow-up appointment in 3 months .  Prescriptions: METFORMIN HCL 1000 MG TABS (METFORMIN HCL) 1 tab by mouth two times a day  #60 x 3   Entered and Authorized by:   Romero Belling MD   Signed by:   Romero Belling MD on 09/05/2009   Method used:   Faxed to ...       [facility] Department (retail)       [address] [place], [place]  [address]       Ph: [phone]       Fax: [phone]   RxID:   [id number]   Laboratory Results   Blood Tests   Date/Time Received: Sep 05, 2009 4:22 PM  Date/Time Reported: Sep 05, 2009 4:40 PM   HGBA1C: 9.2%   (Normal Range: Non-Diabetic - 3-6%   Control Diabetic - 6-8%)  Comments: ...............test performed by......Marland KitchenBonnie A. [place], MLS (ASCP)cm      Appended Document: f/u DM     Primary Care Provider:  Eustaquio Boyden  MD   History of Present Illness: As I'm leaving the room he shows me a wound on his LEFT hand.  He feel and has a 2 cm contusion with broken skin.  No fevers, pus or erythema or swelling.  He does have diabetes.  Allergies: No Known Drug Allergies  Physical Exam  Skin:  Thenar eminence of LEFT hand:  2 cm contusion, superficial laceration, no swelling, erythema, pus.   Impression & Recommendations:  Problem # 1:  LACERATION, HAND, LEFT (ICD-882.0) Assessment New Wound cleaned with H2O2,  sterile gauze and antibiotic ointment applied.  No signs of infection, no antibiotics given.  Red flags given.  Complete Medication List: 1)  Xolair 150 Mg Solr (Omalizumab) .... 300mg  subcutaneously monthly 2)  Flonase 50 Mcg/act Susp (Fluticasone propionate) .... Two puffs each nostril daily 3)  Lipitor 80 Mg Tabs (Atorvastatin calcium) .... One by mouth once daily 4)  Plavix 75 Mg Tabs (Clopidogrel bisulfate) .Marland Kitchen.. 1 by mouth daily 5)  Adult Aspirin Ec Low Strength 81 Mg Tbec (Aspirin) .... One by mouth once daily 6)  Advair Diskus 250-50 Mcg/dose Misc (Fluticasone-salmeterol) .... Use one spray two times a day 7)  Calcium Carbonate-vitamin D 600-400 Mg-unit Tabs (Calcium carbonate-vitamin d) .... Take 1 tablet by mouth once a day 8)  Prednisone 10 Mg Tabs (Prednisone) .... 4 each am x3days, 3 x 3days, 2 x 3days, then one daily and stay 9)  Albuterol Sulfate (2.5 Mg/18ml) 0.083% Nebu (Albuterol sulfate) .Marland Kitchen.. 1 vial via hhn every 4-6 hr as needed 10)  Coreg 6.25 Mg Tabs (Carvedilol) .Marland Kitchen.. 1 by mouth two times a day 11)  Metformin Hcl 1000 Mg Tabs (Metformin hcl) .Marland Kitchen.. 1 tab by mouth two times a day 12)  Astepro 0.15 % Soln (Azelastine hcl) .... Take two sprays each nostril daily 13)  Singulair 10 Mg Tabs (Montelukast sodium) .... One by mouth daily  Patient Instructions: 1)  Return to clinic if your wound becomes swollen, red, has pus, or you develop a fever, or if it does not improve  substantially in 5 days.

## 2010-05-21 NOTE — Letter (Signed)
Summary: CMN for Xolair/[employer]  CMN for Xolair/[employer]   Imported By: Lanelle Bal 08/08/2009 09:51:12  _____________________________________________________________________  External Attachment:    Type:   Image     Comment:   External Document

## 2010-05-21 NOTE — Miscellaneous (Signed)
Summary: Injection Record / [facility]    Injection Record / [facility]    Imported By: Lennie Odor 09/10/2009 15:36:58  _____________________________________________________________________  External Attachment:    Type:   Image     Comment:   External Document

## 2010-05-21 NOTE — Assessment & Plan Note (Signed)
Summary: Refilled for one month till appt  Fax form sent back to [facility] approving one refill of Benecar for 30 days. He must be seen at our office before further refills.
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
## 2010-05-22 ENCOUNTER — Encounter: Payer: Self-pay | Admitting: Family Medicine
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
## 2010-05-29 NOTE — Miscellaneous (Signed)
Summary: Declined to refill Benicar  Faxed back form that no refills until seen at our office. Clinical Lists Changes

## 2010-06-06 ENCOUNTER — Other Ambulatory Visit: Payer: Self-pay | Admitting: Family Medicine

## 2010-06-06 DIAGNOSIS — J45909 Unspecified asthma, uncomplicated: Secondary | ICD-10-CM

## 2010-06-06 MED ORDER — ALBUTEROL SULFATE HFA 108 (90 BASE) MCG/ACT IN AERS: 2.0000 | INHALATION_SPRAY | RESPIRATORY_TRACT | Status: DC | PRN

## 2010-06-06 NOTE — Assessment & Plan Note (Signed)
[facility] Completed with note that no more refills until he is seen

## 2010-06-10 ENCOUNTER — Inpatient Hospital Stay (INDEPENDENT_AMBULATORY_CARE_PROVIDER_SITE_OTHER)
Admission: RE | Admit: 2010-06-10 | Discharge: 2010-06-10 | Disposition: A | Discharge status: Home / Self Care | Payer: Self-pay | Source: Ambulatory Visit | Attending: Emergency Medicine | Admitting: Emergency Medicine

## 2010-06-10 DIAGNOSIS — S139XXA Sprain of joints and ligaments of unspecified parts of neck, initial encounter: Secondary | ICD-10-CM

## 2010-06-10 DIAGNOSIS — H6690 Otitis media, unspecified, unspecified ear: Secondary | ICD-10-CM

## 2010-06-22 ENCOUNTER — Inpatient Hospital Stay (INDEPENDENT_AMBULATORY_CARE_PROVIDER_SITE_OTHER)
Admission: RE | Admit: 2010-06-22 | Discharge: 2010-06-22 | Disposition: A | Discharge status: Home / Self Care | Payer: Self-pay | Source: Ambulatory Visit | Attending: Family Medicine | Admitting: Family Medicine

## 2010-06-22 DIAGNOSIS — S61209A Unspecified open wound of unspecified finger without damage to nail, initial encounter: Secondary | ICD-10-CM

## 2010-06-26 ENCOUNTER — Encounter: Payer: Self-pay | Admitting: Family Medicine

## 2010-06-26 ENCOUNTER — Ambulatory Visit (INDEPENDENT_AMBULATORY_CARE_PROVIDER_SITE_OTHER): Payer: Self-pay | Admitting: Family Medicine

## 2010-06-26 VITALS — BP 124/86 | Temp 98.0°F | Ht 67.5 in | Wt 169.2 lb

## 2010-06-26 DIAGNOSIS — E168 Other specified disorders of pancreatic internal secretion: Secondary | ICD-10-CM

## 2010-06-26 DIAGNOSIS — M25512 Pain in left shoulder: Secondary | ICD-10-CM

## 2010-06-26 DIAGNOSIS — S61209A Unspecified open wound of unspecified finger without damage to nail, initial encounter: Secondary | ICD-10-CM

## 2010-06-26 DIAGNOSIS — S61309A Unspecified open wound of unspecified finger with damage to nail, initial encounter: Secondary | ICD-10-CM

## 2010-06-26 DIAGNOSIS — M25519 Pain in unspecified shoulder: Secondary | ICD-10-CM

## 2010-06-26 LAB — BASIC METABOLIC PANEL
BUN: 20 mg/dL (ref 6–23)
CO2: 20 mEq/L (ref 19–32)
Calcium: 9.3 mg/dL (ref 8.4–10.5)
Chloride: 103 mEq/L (ref 96–112)
Creat: 0.91 mg/dL (ref 0.40–1.50)
Glucose, Bld: 169 mg/dL — ABNORMAL HIGH (ref 70–99)

## 2010-06-26 LAB — CONVERTED CEMR LAB
Calcium: 9.3 mg/dL (ref 8.4–10.5)
Chloride: 103 meq/L (ref 96–112)
Creatinine, Ser: 0.91 mg/dL (ref 0.40–1.50)

## 2010-06-26 MED ORDER — METFORMIN HCL 1000 MG PO TABS: 1000.0000 mg | ORAL_TABLET | Freq: Two times a day (BID) | ORAL | Status: DC

## 2010-06-26 NOTE — Patient Instructions (Signed)
Thank you for coming in today. Please come back in 1-2 months.  I will let you know about the blood work results.  Take metformin 1000 twice a day.

## 2010-06-27 NOTE — Progress Notes (Signed)
[name] presents to clinic today to f/u his right thumb to discuss his left shoulder pain and to get established for his diabetes and his HLD.   1) Thumb: Droped a weight on his right thumb 1 week ago. He was seen at an urgent care office where an X-ray was negative by self report. His thumbnail avulsed partially and has bleed some. He feels well. His thumb is no longer tender and is not bleeding he feels well.   2) Left shoulder pain: Present for 3 weeks. Getting better. He cannot remember any event to cause his shoulder pain. Pain with ROM. No loss of ROM currently. No hand numbness.   3) Diabetes: Taking metformin. Doing well. Needs refill.   Lab Results  Component Value Date   HGBA1C 9.2 09/05/2009  Takes steroids for chronic asthma/COPD. This makes his DM worse.   4) HLD:  Doing well. On home meds listed above.   ROS: As above. Otherwise normal.   Exam:  Vs noted.  Gen: Well NAD HEENT: EOMI, PERRL, MMM Lungs: CTABL Nl WOB Heart: RRR no MRG Abd: NABS, NT, ND Exts: Non edematous BL  LE MSK:  Right thumb with nail plate avulsion mid way. Hematoma (old) present on nail plate. Non tender. No pain with ROM.  Left Shoulder: Normal appearing. Normal ROM in abduction, FF, and int and ext rotation. Normal hawkins, neers, yergersons, and speeds test.   ROS: No wt loss. No fevers chills. See HPI

## 2010-06-28 DIAGNOSIS — S61309A Unspecified open wound of unspecified finger with damage to nail, initial encounter: Secondary | ICD-10-CM | POA: Insufficient documentation

## 2010-06-28 NOTE — Assessment & Plan Note (Addendum)
Old. Doing well. Self report of normal x-ray. I cannot find it in out EMR.   Plan: Reassurance and will follow. If not healing properly in the future will need further eval. Will follow. Red flags reviewed.

## 2010-06-30 DIAGNOSIS — M25512 Pain in left shoulder: Secondary | ICD-10-CM | POA: Insufficient documentation

## 2010-06-30 NOTE — Assessment & Plan Note (Signed)
Refilled metformin. No changes planned. Will check Hb A1c at the next visit in 1 month.

## 2010-06-30 NOTE — Assessment & Plan Note (Signed)
Improving on own with conservative management. Think pt had rotator cuff injury. No current + exam findings. Will follow. Pt at increased risk for frozen shoulder. Encouraged ROM exercises at home. Will follow.

## 2010-07-10 LAB — URINALYSIS, ROUTINE W REFLEX MICROSCOPIC
Glucose, UA: 100 mg/dL — AB
Hgb urine dipstick: NEGATIVE
Ketones, ur: NEGATIVE mg/dL
pH: 5.5 (ref 5.0–8.0)

## 2010-07-10 LAB — POCT I-STAT, CHEM 8
BUN: 11 mg/dL (ref 6–23)
Calcium, Ion: 1.14 mmol/L (ref 1.12–1.32)
HCT: 45 % (ref 39.0–52.0)
Hemoglobin: 15.3 g/dL (ref 13.0–17.0)
Sodium: 138 mEq/L (ref 135–145)
TCO2: 24 mmol/L (ref 0–100)

## 2010-07-10 LAB — POCT CARDIAC MARKERS

## 2010-07-10 LAB — CBC
HCT: 42.2 % (ref 39.0–52.0)
MCHC: 32.9 g/dL (ref 30.0–36.0)
MCV: 77.3 fL — ABNORMAL LOW (ref 78.0–100.0)
RDW: 13.8 % (ref 11.5–15.5)
WBC: 17.6 10*3/uL — ABNORMAL HIGH (ref 4.0–10.5)

## 2010-07-10 LAB — GLUCOSE, CAPILLARY
Glucose-Capillary: 267 mg/dL — ABNORMAL HIGH (ref 70–99)
Glucose-Capillary: 167 mg/dL — ABNORMAL HIGH (ref 70–99)

## 2010-07-10 LAB — DIFFERENTIAL
Basophils Absolute: 0 10*3/uL (ref 0.0–0.1)
Eosinophils Absolute: 0.3 10*3/uL (ref 0.0–0.7)
Eosinophils Relative: 2 % (ref 0–5)
Lymphocytes Relative: 6 % — ABNORMAL LOW (ref 12–46)
Monocytes Absolute: 0.6 10*3/uL (ref 0.1–1.0)
Neutro Abs: 15.7 10*3/uL — ABNORMAL HIGH (ref 1.7–7.7)

## 2010-07-23 LAB — CREATININE, SERUM: GFR calc Af Amer: >60 mL/min (ref >60)

## 2010-07-23 LAB — BUN: BUN: 8 mg/dL (ref 6–23)

## 2010-08-06 ENCOUNTER — Encounter: Payer: Self-pay | Admitting: Family Medicine

## 2010-08-06 ENCOUNTER — Ambulatory Visit (INDEPENDENT_AMBULATORY_CARE_PROVIDER_SITE_OTHER): Payer: Self-pay | Admitting: Family Medicine

## 2010-08-06 VITALS — BP 127/83 | HR 103 | Temp 98.9°F | Ht 67.0 in | Wt 172.0 lb

## 2010-08-06 DIAGNOSIS — K029 Dental caries, unspecified: Secondary | ICD-10-CM

## 2010-08-06 DIAGNOSIS — E168 Other specified disorders of pancreatic internal secretion: Secondary | ICD-10-CM

## 2010-08-06 LAB — POCT GLYCOSYLATED HEMOGLOBIN (HGB A1C): Hemoglobin A1C: 7.6

## 2010-08-06 MED ORDER — OXYCODONE-ACETAMINOPHEN 5-325 MG PO TABS: 1.0000 | ORAL_TABLET | ORAL | Status: DC | PRN

## 2010-08-06 MED ORDER — PENICILLIN V POTASSIUM 250 MG PO TABS: 250.0000 mg | ORAL_TABLET | Freq: Three times a day (TID) | ORAL | Status: AC

## 2010-08-06 NOTE — Patient Instructions (Addendum)
Thank you for coming in today. I will try to get you on the list for free dental care.  It will take MONTHS.  I advise that you call dentists to get a price on tooth removal.  [facility] removes teeth for around $70. The first 30 get seen every day. Look them up on the Internet.  Come back in 1 month.  Watch out for fevers and chills or a swollen jaw or neck.

## 2010-08-06 NOTE — Assessment & Plan Note (Signed)
Doing OK. A1c <8. Would like <7. Reluctant to start sulfanuria currently as he is not eating well.  Will wait till dental issue are stable before starting a 2nd medication. [name] is still young and I would like to keep him <7 Will follow

## 2010-08-06 NOTE — Assessment & Plan Note (Signed)
Appear quite bad on exam. Chronic steroids and DM are not helping. Not currently with abscess.  Plan: [facility]. Will also start opioids for pain control and penicillins to prevent abscess. Will follow up in 1 month. Advised to pay for a private dentist.

## 2010-08-06 NOTE — Progress Notes (Signed)
Presents with tooth pain for several weeks.  Notes pain in his lower jaw bilaterally. He has tried ibuprofen which helps some. He can hardly eat as his mouth is so painful. No fevers or chills. He has noted broken teeth and cavities when looking in the mirror. No jaw masses.   Additionally he is here to f/u his DM.  CHRONIC DIABETES  Disease Monitoring  Blood Sugar Ranges: 113-150  Polyuria: no   Visual problems: no   Medication Compliance: yes  Medication Side Effects  Hypoglycemia: no   Lab Results  Component Value Date   HGBA1C 7.6 08/06/2010     PMH Reviewed ROS: As above. No fevers or chills.  Wt Readings from Last 3 Encounters:  08/06/10 172 lb (78.019 kg)  06/26/10 169 lb 3.2 oz (76.749 kg)  04/23/10 178 lb 6.1 oz (80.913 kg)    Exam:  Vs noted.  Gen: Well NAD HEENT:MMM several teeth in both bilateral and lower and upper jaw with broken or caries.  No fistula or abscess formation apparent. Tender to touch. No cervical LAD.  Lungs: CTABL Nl WOB Heart: RRR no MRG Abd: NABS, NT, ND Exts: Non edematous BL  LE

## 2010-08-14 ENCOUNTER — Encounter: Payer: Self-pay | Admitting: Family Medicine

## 2010-08-14 ENCOUNTER — Ambulatory Visit (INDEPENDENT_AMBULATORY_CARE_PROVIDER_SITE_OTHER): Payer: Self-pay | Admitting: Family Medicine

## 2010-08-14 VITALS — BP 130/85 | HR 90 | Temp 99.0°F | Ht 67.0 in | Wt 172.0 lb

## 2010-08-14 DIAGNOSIS — K089 Disorder of teeth and supporting structures, unspecified: Secondary | ICD-10-CM

## 2010-08-14 DIAGNOSIS — K0889 Other specified disorders of teeth and supporting structures: Secondary | ICD-10-CM

## 2010-08-14 DIAGNOSIS — K029 Dental caries, unspecified: Secondary | ICD-10-CM

## 2010-08-14 MED ORDER — PENICILLIN V POTASSIUM 250 MG PO TABS: 500.0000 mg | ORAL_TABLET | Freq: Three times a day (TID) | ORAL | Status: AC

## 2010-08-14 NOTE — Assessment & Plan Note (Signed)
Tooth #18, +swelling, +tenderness, +abscess.  Will treat with PCN tid x 7 days and will [facility].  Pt still has oxycodone at home so will not need another Rx today.

## 2010-08-14 NOTE — Progress Notes (Signed)
  Subjective:    Patient ID: [name], male    DOB: April 14, 1961, 50 y.o.   MRN: [id number]  HPI  Tooth pain: Pt has had dental pain x a few weeks.  He was seen in clinic las week and was referred to dental clinic, but when he arrived there he was told that they did not have a referral from our clinic. Pt finished course of PCN and is taking oxycodone for pain.  He takes tylenol in the daytime and oxycodone at night because it causes drowsiness. Pt has not been able to eat solids x 1 week due to pain. +Facial swelling x a few days now. -Fever/chills, nausea, vomiting, bleeding, headache, neck stiffness.  Review of Systems    per hpi  Objective:   Physical Exam  Constitutional: He appears well-developed and well-nourished. No distress.  HENT:  Mouth/Throat:         Face is swollen on L side. +warmth.  +tenderness.  Tooth #18: there appears to be abscess, tooth with areas that appears dark with cavity  Lymphadenopathy:    He has cervical adenopathy.          Assessment & Plan:

## 2010-09-03 NOTE — Consult Note (Signed)
[name], Mills              ACCOUNT NO.:  [id number]   MEDICAL RECORD NO.:  [id number]          PATIENT TYPE:  INP   LOCATION:  3735                         FACILITY:  [facility]   PHYSICIAN:  Kalman Shan, MD   DATE OF BIRTH:  [birth year]   DATE OF CONSULTATION:  02/22/2007  DATE OF DISCHARGE:                                 CONSULTATION   TIME OF CONSULTATION:  12:15 p.m., February 22, 2007, to 12:55 p.m.  February 22, 2007.   REASON FOR CONSULTATION:  Assess asthma and medications.   CONSULT REQUESTED BY:  Dr. Charlies Constable of cardiology.   CHIEF COMPLAINT:  Evaluation of asthma.   HISTORY OF PRESENT ILLNESS:  [name] is a pleasant 50 year old  male who was admitted on February 17, 2007 with acute onset of sternal  chest pain.  Point of care troponin was positive. Initial EKG showed  hyperacute T waves.  He was then taken to the cath lab and was found to  have a 90% single vessel disease, and had LAD stent.  After that, he was  admitted to the coronary intensive care unit where he was followed. He  has been chest pain free since intervention.   ISSUES DURING HOSPITALIZATION:  1. According to his history, his chronic maintenance prednisone was      stopped at admission, and after this he had hypotension.      Hypotension resolved only after reinitiating steroids.  Initially,      this was stress-dosed steroids, but he is now back to is      maintenance of 20 mg once daily.  At the time of hypptension post      MI, there was actually concern for new occlusion, and he had a      second cardiac catheterization on February 19, 2007 including right      heart catheterization.  This showed widely patent LAD stent post      MI, nonobstructive left circumflex and right coronary artery      stenosis, moderate LV dysfunction, low cardiac output with an EF of      35%.  He also had right heart catheterization which showed wedge      pressure of 22, pulmonary artery pressures of  42 and 26 with a mean      of 35, right atrial pressure of 12, right ventricular pressure of      40/14, pulmonary artery saturation of 57, aortic saturation of 91,      and a cardiac index of 1.7 liters per minute per meter squared.   1. In addition, he states in the last few days blood sugars have been      high in the 300s, so we suspect he has new onset diabetes.   1. The third issue is that he has had some colored sputum since      February 19, 2007.  This, he says, is consistent with bronchitis      episodes that he gets when his asthma acts up, but otherwise he      denies any symptoms of asthma exacerbation.  Specifically, he      denies wheezing, shortness of breath, edema, hemoptysis, change in      sputum volume, worsening cough.   PAST MEDICAL HISTORY:  1. Asthma was diagnosed in [place] at least 4 years ago.  He was      initially started on prednisone burst, but he failed tapers he said      at least like over 10 times, and therefore he had to be on      maintenance prednisone.  He has been on 20 mg once daily at least      for the last 4 years.  When he moved to [facility], he has been      under the care of Dr. Shan Levans, who has also apparently tried      to wean his prednisone, but this has been unsuccessful.   1. Chronic pansinusitis.  Dr. Delford Field has felt that he would benefit      from surgical intervention and referred him to Dr. Narda Bonds      back in 2007.  He did have endoscopic sinus surgery with bilateral      total ethmoidectomies.  In addition, he also had bilateral      sphenoidotomies and simple inferior turbinate reductions.  Also      cleaning and irrigating areas with Ciprodex.   1. Recurrent otitis media with pus coming out of the ears, along      with asthma exacerbation.   1. New onset diabetes mellitus this admission.   MEDICATIONS AT HOME PRIOR TO ADMISSION:  1. Advair 500/50 one spray b.i.d.  2. Prednisone 20 mg daily.  3.  Nasacort.  4. Albuterol is to be taken p.r.n. which he takes 2 to 3 times a week      as needed.   Since admission, he is also on cardiac medications that include:  Aspirin, Lipitor, Plavix, digoxin, Vasotek, Lovenox, Lopressor p.r.n.,  nitroglycerin p.r.n.   ALLERGIES:  NO KNOWN ALLERGIES.   SOCIAL HISTORY:  Immigrated from [place] many years ago.  Has been  living in [place] for 12 years.  He has been in the [place] for 14  [place].  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  His wife is with him.  He used to smoke in the past, but quit over 6 years ago.   FAMILY HISTORY:  His mother is still alive, and he did have 1 brother  who died at age 81, but this was not cardiac related.  Although, he  states that his father and grandfather lived to be 100.   REVIEW OF SYSTEMS:  Currently positive only for some mild brown sputum.  No chest pain.  No wheezing.   PHYSICAL EXAMINATION:  VITAL SIGNS:  Temperature 97.5, pulse 70,  respiratory rate 20, blood pressure 94/65 systolic, saturation 98% on  room air.  Blood glucose of 106.  GENERAL EXAM:  Cushing right face.  Seated comfortably.  HEENT:  Elevated JVP.  No neck nodes.  RESPIRATORY EXAM:  Air entry equal.  No crackles.  No wheeze.  CARDIOVASCULAR:  Normal heart tones.  No murmurs.  ABDOMEN:  Obese, soft, nontender.  EXTREMITIES:  No clubbing, cyanosis, or edema.  SKIN:  Bruises from venipunctures and catheterization present.   LABORATORY VALUES:  1. Blood gas on February 19, 2007 was only a venous blood gas, and      therefore not interpretable.  2. Radiology from February 18, 2007 shows no active cardiopulmonary  disease.  3. Hematology from February 20, 2007, white count 14.7, hemoglobin      13.5, platelets of 228,000.   GENERAL CHEMISTRIES:  From February 21, 2007, are normal except sugar is  194, creatinine is normal at 1.03, calcium is normal at 9.1.   ASSESSMENT/PLAN:  In summary, this is a 50 year old previous smoker  with  steroid dependent asthma, who is on a severe persistent NIH regiment  for asthma which controls him at mild intermittent-persistent level  (requires only albuterol two to three times per week).  Currently he is  not in exacerbation clinically, although he has some symptoms of acute  bronchitis.  He did have hypotension following withdrawal of steroids,  but now his blood pressures are normal after steroids have been  reinstituted.   Problems are a) Acute Bronchitis; b) Stable Asthma; and c) Steroid  dependency (cushingoid and adrenal suppression)   RECOMMENDATIONS:  1. Avelox 400 mg once daily for 6 days to treat the bronchitis and to      prevent asthma exacerbation.  2. Continue prednisone 20 mg once daily, Advair maintenance inhaler,      and albuterol p.r.n.  3. Follow pulmonary clinic.  4. No other intervention at this time.  5. Have contacted Dr. Delford Field who will see him 02/23/2007      Kalman Shan, MD  Electronically Signed     MR/MEDQ  D:  02/22/2007  T:  02/22/2007  Job:  [id number]

## 2010-09-03 NOTE — Assessment & Plan Note (Signed)
[facility]                             PULMONARY OFFICE NOTE   PIERS, BAADE                       MRN:          [id number]  DATE:11/27/2006                            DOB:          30-Sep-1960    [name] is a 50 year old [place] male history of severe persistent  asthma, pansinusitis, significant allergic rhinitis.  He has run out of  his Advair for 2 weeks but is getting prescription help for this.  He  had to go up on his prednisone in the interim to 30 mg a day from 10 mg  a day.  He is on Singulair 10 mg a day.  Xolair he receives monthly at  300 mg.   EXAM:  Temp 97, blood pressure 120/78, pulse 112, saturation 96% room  air.  CHEST:  Clear today without evidence of wheeze, rale or rhonchi.  CARDIAC EXAM:  A regular rate and rhythm without S3, normal S1 S2.  ABDOMEN:  Soft, nontender.  EXTREMITIES:  No edema or clubbing.  Skin was clear.   IMPRESSION:  Moderate persistent asthma with severe persistent disease  and significant allergic rhinitis.   PLAN:  The patient was given samples of the Advair 112 HFA to substitute  until he gets his Advair prescription in the mail for the diskus.  He is  also getting started on Nasacort 2 sprays each nostril daily for nasal  airway inflammation.  Will see the patient back in followup in 8 weeks.     Charlcie Cradle Delford Field, MD, [facility]  Electronically Signed    PEW/MedQ  DD: 11/27/2006  DT: 11/27/2006  Job #: [id number]

## 2010-09-03 NOTE — Assessment & Plan Note (Signed)
[facility]                            CARDIOLOGY OFFICE NOTE   [name], [name]                       MRN:          [id number]  DATE:03/10/2007                            DOB:          February 25, 1961    PRIMARY CARE PHYSICIAN:  Towana Badger, M.D.   CLINICAL HISTORY:  [name] is a 50 year old gentleman who works in a  service station as a Conservation officer, nature and is originally from [place].  He was  admitted on October 30 with an anterior wall myocardial infarction  treated with primary angioplasty.  Some of his worst symptoms were the  day before and we think he may have been opened late.  He developed  hypotension the next day and we were not certain whether this was  related to a complication from his infarction, reocclusion, or related  to the fact that he did not get his steroids continued for the first 36  hours.  He had been on prednisone 20 mg each day at home for asthma.  Dr. Excell Seltzer did re-look and his artery was open, and he improved with  steroid treatment.  His ejection fraction at the time of catheterization  was 40-45%.   He has done well since discharge and had no recurrent chest pain,  shortness of breath or palpitations.   His current medications include aspirin, Plavix, Lipitor, enalapril,  digoxin, prednisone, Coreg, Avelox and Singulair.   EXAMINATION:  The blood pressure is 118/71, the pulse 75 and regular.  There was no venous distention.  The carotid pulses were full without  bruits.  CHEST:  Clear without rales or rhonchi.  CARDIAC:  Rhythm was regular.  I could hear no murmurs or gallops.  ABDOMEN:  Soft with normal bowel sounds.  There was no  hepatosplenomegaly.  Peripheral pulses were full.  There was no peripheral edema.   An electrocardiogram showed evidence of a recent anterior/inferior  myocardial infarction.   IMPRESSION:  1. Recent anterior wall myocardial infarction treated with drug-      eluting stent to the left  anterior descending artery.  2. Postprocedural hypotension related to steroid deficiency, now      resolved.  3. Ejection fraction 40-45%.  4. Hyperlipidemia.  5. Asthma.   RECOMMENDATIONS:  I think [name] is doing well at present.  We will  make some changes in his medications to help with cost issues.  He is  currently on enalapril 5 mg b.i.d. and we will switch him to Accupril 10  mg b.i.d., [facility].  We will  switch him from Lipitor 80 mg to simvastatin 40 mg generic.  We will  continue his other medications at present.  He had a recent BMP, which  was good.  He is to start cardiac rehab  tomorrow.  I will plan to see him back in 5 weeks.  We will get a lipid  and liver profile, CBC and BMP before that visit.     Bruce Elvera Lennox Juanda Chance, MD, [facility]  Electronically Signed    BRB/MedQ  DD: 03/10/2007  DT: 03/11/2007  Job #: [id number]

## 2010-09-03 NOTE — Cardiovascular Report (Signed)
NAMEBONNIE, ROIG              ACCOUNT NO.:  [id number]   MEDICAL RECORD NO.:  [id number]          PATIENT TYPE:  INP   LOCATION:  2905                         FACILITY:  [facility]   PHYSICIAN:  Bruce R. Juanda Chance, MD, FACCDATE OF BIRTH:  25-Sep-1960   DATE OF PROCEDURE:  02/18/2007  DATE OF DISCHARGE:                            CARDIAC CATHETERIZATION   CLINICAL HISTORY:  Mr. Tauzin is 50 years old and has chronic obstructive  pulmonary disease but no known prior heart disease.  He does have a  positive family history of heart disease.  He came to the emergency room  at 1:30 a.m. with chest pain.  His initial ECG was not diagnostic but a  repeat ECG at 4:29 a.m. showed 1.5 mm anterior ST elevation in leads VII  to VIII.  A decision was made to bring him urgently to the  catheterization laboratory.   PROCEDURE:  The procedure was performed by right femoral artery and  arterial sheath and 6-French free flowing coronary catheters.  A front  wall arterial puncture was performed and Omnipaque contrast was used.  After completion of the diagnostic study, made a decision to  intervention on the totally occluded LAD.   The patient had been given chewable aspirin and Plavix and heparin in  the emergency department.  He was given Angiomax bolus and infusion.  We  used a Q 3.5, 6-French guiding catheter with side holes.  We cross the  lesion without too much difficulty with a PT2 light support wire.  We  dilated the lesion with a 2.0 x 50 mm Maverick, performing one inflation  to 10 atmospheres for 30 seconds.  We then deployed a 2.75 x 23 mm  Promus stent, performing this with one inflammation to 14 atmospheres  for 30 seconds.  This crossed a moderate sized diagonal branch.  We then  performed IVUS run with an [facility] IVUS catheter and automatic pullback.  This documented the distal reference diameter was 2.5 to 2.75 and the  proximal reference diameter was 3.75.  The stent was not quite  totally  opposed in its very proximal edges in the proximal few millimeters.  Based on the IVUS findings, we went back in with a 3.5 x 20 mm Quantum  Maverick and performed two inflations up to 16 atmospheres for 30  seconds avoiding the very distal edge of the stent.  We then went in  with a 3.75 x 12 mm Quantum Maverick and performed two inflations up to  16 atmospheres for 30 seconds in the proximal aspect of the stent.  We  then performed the final IVUS run which showed good transition at the  proximal and distal edges, a well expanded stent in full apposition.  The right coronary artery was closed with Angio-Seal at the end of the  procedure.  The patient tolerated the procedure well and left the  laboratory in satisfactory condition.   RESULTS:  Left Main Coronary Artery:  Left main coronary artery was free  of significant disease.   Left Anterior Descending Artery:  Left anterior descending artery gave  rise to a  small to moderate sized diagonal branch and the septal  perforating end was completely occluded.  This LAD filled via septum  with septal collaterals.   Left Circumferential Artery:  Left circumflex artery gave rise to  marginal branch, an atrial branch and a posterolateral branches.  There  was 90% opposite stenosis in the marginal branch.   Right Coronary Artery:  The right coronary artery is moderate size  vessel that gave rise to two right ventricular branches and posterior  descending branches and two posterolateral branches.  There were  irregularities but no significant obstruction.   Left Ventriculogram:  The left ventriculogram performed in the RAO  projection showed akinesis of the anterolateral on apex extending about  1/3 of the way and including this distal 1/3 of the inferior wall.  The  estimated ejection fraction was 35%.   Following stenting of the lesion, the LAD of central narrowing from 100%  to 0% and the flow improved from TIMI 0 to TIMI 3  flow.   CONCLUSION:  1. Acute anterior wall myocardial infarction with total occlusion of      the left anterior descending artery, 90% opposite stenosis of the      marginal branch of circumflex artery, no significant obstruction of      the right coronary artery and anterolateral and apical akinesis      with an estimated ejection fraction of 35%.  2. Successful reperfusion and stenting of the lesion in the proximal      to mid left anterior descending with improvement of central      narrowing from 100% to 0% and improvement of flow from TIMI 0 to      TIMI 3 flow using a Promus drug-eluting stent.   DISPOSITION:  The patient returned __________ for further observation.      Bruce Elvera Lennox Juanda Chance, MD, [facility]  Electronically Signed     BRB/MEDQ  D:  02/18/2007  T:  02/18/2007  Job:  [id number]   cc:   Everardo Beals. Juanda Chance, MD, [facility]  Towana Badger, M.D.

## 2010-09-03 NOTE — Cardiovascular Report (Signed)
Mills, Theodore              ACCOUNT NO.:  [id number]   MEDICAL RECORD NO.:  [id number]          PATIENT TYPE:  INP   LOCATION:  2905                         FACILITY:  [facility]   PHYSICIAN:  Veverly Fells. Excell Seltzer, MD  DATE OF BIRTH:  24-Mar-1961   DATE OF PROCEDURE:  02/19/2007  DATE OF DISCHARGE:                            CARDIAC CATHETERIZATION   PROCEDURE:  1. Right heart catheterization.  2. Left heart catheterization.  3. Selective coronary angiography.  4. Left ventricular angiography.  5. StarClose of the right femoral artery.   INDICATIONS:  [name] is a 50 year old gentleman who suffered an  anterior myocardial infarction early yesterday morning.  He underwent  primary PCI by Dr. Juanda Chance.  The LAD was stented with an excellent  angiographic result.  There was some compromise of the diagonal ostium,  but there was TIMI-3 flow in the diagonal branch.  He initially did well  but has deteriorated over the course of the last several hours with  hypotension and tachycardia.  A bedside echocardiogram did not show a  significant pericardial effusion, and with his ongoing hypotension, we  elected to perform re-look catheterization to check for stent patency as  well as a right heart catheterization to reassess his hemodynamics.   Risks and indications of the procedure were reviewed with the patient.  Informed consent was obtained. Using the modified Seldinger technique, a  6-French sheath was placed in the right femoral artery, and a 7-French  sheath was placed in the right femoral vein.  A Swan-Ganz catheter was  used for the right heart assessment.  Pressures were recorded throughout  the right heart chambers and right atrium and the pulmonary capillary  wedge position.  Oxygen saturation was drawn from the pulmonary artery  and aorta.  Cardiac output was calculated by the Fick method.  Following  the right heart catheterization, an angled pigtail catheter was inserted  into  the left ventricle where pressures were recorded.  A left  ventriculogram was performed. A pullback across the aortic valve was  done. Coronary angiography was then performed with standard 6-French  Judkins catheters.  At the completion of the procedure, a StarClose  device was used to seal the femoral arteriotomy.  The venous sheath was  pulled and manual pressure used for hemostasis.   FINDINGS:  Right atrial pressure mean of 12, right ventricular pressure  of 40/14.  Pulmonary artery pressure 42/26 with a mean of 35, pulmonary  capillary wedge pressure mean of 22, left ventricular pressure 89/17.  Aortic pressure 80/51 with a mean of 63.   Oxygen saturation from pulmonary artery was 57%;  aorta is 91%. Cardiac  output by the Fick method is 3.5 liters per minute.  Cardiac index 1.7  liters per minute per meter squared.   CORONARY ANGIOGRAPHY:  Left main coronary artery is widely patent and  angiographically normal.   The LAD is a large-caliber vessel that courses down and wraps around the  LV apex.  The LAD has minor luminal irregularities in its proximal  aspect.  There is a stent in the mid portion that is  widely patent.  The  first diagonal branch has ostial stenosis but TIMI-3 flow in the vessel.  The flow in the epicardial LAD is normal; however, contrast remains in  the LAD after complete washout of contrast in the left circumflex,  suggesting microvascular dysfunction.   The left circumflex is a medium-caliber vessel.  The circumflex courses  down and supplies a first OM branch and a left posterolateral branch.  There is no significant obstructive disease identified in the left  circumflex system.   The right coronary artery is dominant.  It supplies a PDA and  posterolateral branch.  There is no significant angiographic stenosis  throughout the right coronary artery.   Left ventriculography demonstrates hyperdynamic left ventricular  function involving the bases of the  heart.  The apex is akinetic.  The  overall LVEF is 40-45%.   ASSESSMENT:  1. Widely patent mid left anterior descending stent following primary      percutaneous coronary intervention for treatment of  an anterior      myocardial infarction.  2. Nonobstructive left circumflex and right coronary artery stenosis.  3. Moderate left ventricular systolic dysfunction.  4. Low cardiac output.   PLAN:  Will continue with post MI medical therapy.  [name] is  somewhat improved after receiving high-dose steroids.  He has a widely  patent stent in the LAD.      Veverly Fells. Excell Seltzer, MD  Electronically Signed     MDC/MEDQ  D:  02/19/2007  T:  02/20/2007  Job:  [id number]

## 2010-09-03 NOTE — Assessment & Plan Note (Signed)
[facility]                            CARDIOLOGY OFFICE NOTE   [name], [name]                       MRN:          [id number]  DATE:04/21/2007                            DOB:          04/10/1961    PRIMARY CARE PHYSICIAN:  Towana Badger, M.D.   [name] is 50 years old and returns for management of his coronary  heart disease following a recent myocardial infarction.  [name] is  originally from [place] and works in a service station as a Conservation officer, nature.  He was admitted on October 30th with an anterior wall myocardial  infarction and treated with primary angioplasty.  He had postoperative  hypotension which was thought to be related to relative adrenal  insufficiency since he had been on steroids prior to his heart attack.  He subsequently did well, and follow-up echocardiogram showed an  ejection fraction of 40-45%.   He says he has been doing well recently with no chest pain, shortness of  breath, or palpitations.  He is back at work.  He has seen Dr. Delford Field,  who is working on tapering his steroids for his asthmatic lung disease.   His past medical history is significant for hyperlipidemia and asthma.   Current medications include aspirin, Plavix, Lipitor, enalapril, Advair,  digoxin, prednisone, Coreg, and Singulair.   PHYSICAL EXAMINATION:  Blood pressure is 110/68, pulse 83 and regular.  There was no venous distention.  Carotid pulses were full without  bruits.  CHEST:  Clear.  Cardiac rhythm was regular.  There were no murmurs or gallops.  ABDOMEN:  Soft without organomegaly.  Peripheral pulses were full.  There was no peripheral edema.   IMPRESSION:  1. Recent anterior wall myocardial infarction in October, 2008.      Treated with a drug-eluting stent to the left anterior descending      artery.  2. Postoperative hypotension related to adrenal insufficiency, now      resolved.  3. Ejection fraction 40-45%.  4. Hyperlipidemia.  5.  Asthma.   I think [name] is doing quite well at present.  I will plan to  discontinue his digoxin since I do not think he has clear indications  for that now.  His lipid profile is quite good, and we will continue his  current therapy, and I will see him back in followup in four months.    Bruce Elvera Lennox Juanda Chance, MD, [facility]  Electronically Signed   BRB/MedQ  DD: 04/21/2007  DT: 04/21/2007  Job #: [id number]

## 2010-09-03 NOTE — Discharge Summary (Signed)
[name], [name]              ACCOUNT NO.:  [id number]   MEDICAL RECORD NO.:  [id number]          PATIENT TYPE:  INP   LOCATION:  3735                         FACILITY:  [facility]   PHYSICIAN:  Bruce R. Juanda Chance, MD, FACCDATE OF BIRTH:  [birth year]   DATE OF ADMISSION:  02/18/2007  DATE OF DISCHARGE:  02/23/2007                               DISCHARGE SUMMARY   PRIMARY CARDIOLOGIST:  Everardo Beals. Juanda Chance, MD, [facility]   PRIMARY CARE PHYSICIAN:  Towana Badger, M.D.   PROCEDURES PERFORMED DURING HOSPITALIZATION:  1. Cardiac catheterization, right heart cath and left heart cath with      selective coronary angiography on 02/19/2007.      a.     Widely patent mid left anterior descending stents following       primary percutaneous coronary intervention for treatment of       anterior myocardial infarction.  2. Nonobstructive left circumflex and right coronary artery stenosis.  3. Moderate left ventricular systolic dysfunction.  4. Low cardiac output.      a.     LAD large caliber vessel that courses down and wraps around       the LV apex.  LAD has minor luminal irregularities in its proximal       aspect.  There is a stent in the mid portion that is widely       patent.  The first diagonal branch has osteal stenosis with TIMI       III flow in the vessel.  The flow in the epicardial LAD is normal;       however, contrast remains in the LAD after complete washout of       contrast in the left circumflex suggesting microvascular       dysfunction.  The left circumflex is a medium caliber vessel.  The       circumflex courses down and supplies the first OM branch and the       left posterior lateral branch.  There is no significant       obstructive disease identified in the left circumflex system.  The       right coronary artery is dominant.  It supplies the PDA and       posterolateral branch.  There is no significant angiographic       stenosis throughout the right coronary artery.  Left  ventriculography demonstrates hyperdynamic left ventricular       function involving the bases of the heart, the apex is akinetic.       The overall LVEF is 40 to 45%.   FINAL DISCHARGE DIAGNOSES:  1. Anterior wall myocardial infarction.      a.     Status post drug eluting stent to the LAD with an EF of 35%.  2. Adrenal insufficiency, now controlled.  3. Asthma.  Steroid treatments.   HOSPITAL COURSE:  This is a 50 year old Asian male with no known cardiac  history who on arrival home around midnight the night of admission ate  supper as he usually does and began to develop substernal chest pain  which quickly  intensified such that he was unable to sleep and felt very  restless.  He presented to the emergency room complaining of 10/10 chest  pressure and very uncomfortable, tachycardiac, tachypneic and  hypertensive.  The patient did have emesis x1 in the emergency  department.  His point of care troponin was positive and initial EKG  showed hyper acute T waves.  The patient was given aspirin, Heparin, IV  morphine and nitroglycerin drip and continued to complain of chest  discomfort although it was decreased to 3/10.  The patient's EKG  revealed an acute myocardial infarction with anterior, inferior EKG  changes, positive point of care enzymes with ongoing chest discomfort.  The patient remained on heparin drip and nitroglycerin drip and was  brought to cardiac catheterization lab where Dr. Juanda Chance performed a  cardiac catheterization with results described above.  Post procedure  the patient was admitted to CCU with further risk ratification and with  echocardiogram, fasting lipid panel, fasting blood sugars and hemoglobin  A1c.  The patient was monitored closely over the next 24 hours and had  stabilized.   The following morning the patient was followed by Dr. Rollene Rotunda and  was found to be mildly tachycardiac.  The patient was given low pressure  5 mg IV and started on p.o.  beta-blockers.  The patient was found to  have some hypotension and tachycardia.  The patient's blood pressure was  80/60 and with 12 and 14 mm pulse pressure.  The patient was monitored  closely and echocardiogram was completed.  Echocardiogram revealed an EF  of 45% with a small effusions.  No tamponade was noted.  The patient's  systolic blood pressure remained low at 60 to 70.  The patient was  started on steroids and brought back to the catheterization lab for a re-  look at his stent and completion of right heart cath.   Right heart catheterization was completed by Dr. Tonny Bollman on  02/19/2007 revealing apical akinesis with an EF of 40%, widely patent  LAD stent, post MI, nonobstructive left circumflex and right coronary  artery stenosis with moderate LV dysfunction and low cardiac output.  Continued post MI medical regimen was recommended.  The patient began to  improve on IV steroids and suspicion for adrenal insufficiency was  discussed in playing a role in the patient's symptoms.   The patient was followed closely over the next several days by Dr.  Andee Lineman and Dr. Antoine Poche.  The patient continued on IV Solu-Medrol and  started on Digoxin.  The patient's TSH was checked and found to be  normal.  The patient began significant improvement and on 02/21/2007 the  patient was moved to telemetry and was up walking in the hall with  cardiac rehab and I checked for symptoms while exercising.  The patient  had no further complaints of chest pain and tolerated the exercise well.  The patient's blood pressure stabilized without any further evidence of  hypotension.  The patient was started on post MI Plavix and aspirin.  He  was also started on Avelox 400 mg once a day for pericarditis and small  effusion on echo.  The patient tolerated this well.  The patient was  seen and examined by Dr. Charlies Constable on 02/22/2007.  The patient  continued to improve.  Blood pressure was 130/70 with a  heart rate of  96.  BUN and creatinine were stable and the patient's potassium was  stable.  The patient did have  a pulmonary consult to evaluate for asthma  and the patient's steroids did taper down.  The patient was seen by Dr.  Shan Levans and held Advair was prescribed along with continuation of  p.o. steroids.   The patient was seen and examined by Dr. Charlies Constable on the day of  discharge and found to be stable.  Blood pressure was mildly hypotensive  at 96/60, heart rate 95.  The patient was pain free and was found to be  stable for discharge.  He will have a follow up appointment with Dr.  Juanda Chance in 2 weeks and he will continue with Dr. Shan Levans as an  outpatient for continued pulmonary evaluation and also followup with  primary care physician for continued medical management.   DISCHARGE LABS:  Hemoglobin 12.4, hematocrit 36.5, white blood cells  15.5, platelets 254, sodium 138, potassium 4.1, chloride 106, CO2 22,  BUN 14, creatinine 1.03, glucose 194, CNP 131, cholesterol 201, lipids  290, HDL 45, LDL 98, hemoglobin A1c 5.9, TSH 1.209, troponin 0.46.  Point of care troponin less than 0.05 and 0.24, respectively.  Chest x-  ray revealing no active cardiopulmonary disease.  EKG dated February 20, 2007 revealing normal sinus rhythm with inferior and anterior septal  infarct.   DISCHARGE MEDICATIONS:  1. Aspirin 325 mg daily.  2. Plavix 75 mg daily.  3. Lipitor 80 mg daily.  4. Colace 200 mg daily.  5. Enalapril 5 mg twice a day.  6. Advair 500/50 mg twice a day.  7. Digoxin 0.25 mg daily.  8. Prednisone 20 mg daily.  9. Coreg 6.25 mg twice a day.  10.Avelox 400 mg x4 more days.  11.Nitroglycerin 0.4 mg p.r.n. chest pain.   ALLERGIES:  No known drug allergies.   FOLLOW UP PLANS AND APPOINTMENTS:  1. The patient is to follow with Dr. Charlies Constable on November 19 at 9      a.m. for continued cardiac management.  2. The patient is to follow with Dr. Shan Levans for continued      pulmonary management of asthma.  3. The patient is to follow up with primary care physician for      continued medical management.  4. The patient has been given post cardiac catheterization      instructions with particular emphasis on the right groin site for      evidence of bleeding, hematoma, size of infection or severe pain.   Time spent with the patient to include physician time 50 minutes.      Bettey Mare. Lyman Bishop, NP      Everardo Beals. Juanda Chance, MD, [facility]  Electronically Signed    KML/MEDQ  D:  02/23/2007  T:  02/23/2007  Job:  [id number]   cc:   Charlcie Cradle. Delford Field, MD, FCCP

## 2010-09-03 NOTE — H&P (Signed)
[name], [name] NO.:  [id number]   MEDICAL RECORD NO.:  [id number]          PATIENT TYPE:  EMS   LOCATION:  MAJO                         FACILITY:  [facility]   PHYSICIAN:  Christell Faith, MD   DATE OF BIRTH:  17-Mar-1961   DATE OF ADMISSION:  02/18/2007  DATE OF DISCHARGE:                              HISTORY & PHYSICAL   Admit to [facility], Dr. Juanda Chance.   PRIMARY CARE PHYSICIAN:  Dr. Mannie Stabile   CHIEF COMPLAINT:  Chest pressure.   HISTORY OF PRESENT ILLNESS:  This is a 50 year old Asian man with no  known cardiac history who got home from work around midnight last night  and ate supper like he usually does.  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  However, soon after eating, he developed  substernal chest pressure which quickly intensified such that he was  unable to sleep and felt very restless.  He came to the emergency  department complaining of 10/10 chest pressure; he was very  uncomfortable, tachycardic, tachypneic and hypertensive.  He denied  shortness of breath, but did have emesis x1 in the emergency department.  His point-of-care troponin is positive and initial EKG shows hyperacute  T waves.  After aspirin, heparin, IV morphine and nitroglycerin drip,  the patient was still complaining of 3/10 chest pain.   PAST MEDICAL HISTORY:  1. Asthma.  2. Sinus surgery.  3. Prednisone dependency.   SOCIAL HISTORY:  He lives in [place] with his wife and three  children.  He was formerly a heavy smoker, but quit five years ago.  He  works as a Conservation officer, nature.   FAMILY HISTORY:  Mother is still alive; I am unsure about the status of  his father, and the patient did have one brother who died at age 47, but  this was not thought to be a cardiac-related death.   ALLERGIES:  None.   MEDICINES:  Advair and prednisone.   REVIEW OF SYSTEMS:  Positive for chest pain, coughing, wheezing, nausea  and vomiting, otherwise the balance of 14  systems is reviewed and is  negative.   PHYSICAL EXAMINATION:  VITAL SIGNS:  Temperature 97.4, pulse initially  104, repeat 91, respiratory rate initially 25, repeat 18, blood pressure  initially 175/101, repeat 150/89, saturation 98% on room air.  GENERAL:  This is an [place] male who appears his stated age.  He now  appears comfortable.  He is in no acute distress.  Cushingoid facies.  HEENT:  Pupils are equal, round and reactive.  Sclerae are clear.  Dentition is good.  NECK:  Supple.  No carotid bruits.  Neck veins are flat.  No cervical  lymphadenopathy.  CARDIAC EXAM:  Rhythm is regular, rate is controlled.  No murmurs, no  gallops, no rub.  VASCULAR EXAM:  2+ radial and dorsalis pedis pulses bilaterally.  Right  femoral pulse is 2+ without bruit.  PULMONARY EXAM:  Lungs are clear to auscultation bilaterally without  wheezing or rales.  ABDOMEN:  Soft, nontender, nondistended.  Normal bowel sounds.  No  bruits.  EXTREMITIES:  No clubbing, cyanosis or edema.  SKIN EXAM:  No rash.  NEUROLOGIC:  The patient is drowsy, but is oriented x3.  5/5 strength in  all four extremities.  Facial expressions are symmetric.   DIAGNOSTIC TESTING:  Chest x-ray reveals no acute findings.   Electrocardiogram #1 reveals sinus tachycardia, rate of 105 with  anterior hyperacute T waves.  EKG #2 shows sinus rhythm with a rate of  78 and new development of mild (approx 1 mm) ST elevation with  significant inferior T wave inversions and loss of anterior R wave  voltage.   LABS:  White blood cell 14.8, hemoglobin 14.9, platelets 271.  Sodium  138, potassium 3.8, BUN 10, creatinine 1.1, glucose 212.  Point-of-care  enzymes set #2 reveals myoglobin greater than 500, CK-MB 14.3, troponin  0.24; the first set was negative.   IMPRESSION:  A 50 year old man with an acute myocardial infarction with  anterior and inferior EKG changes and positive point-of-care cardiac  enzymes.  He is still not pain free  despite receiving appropriate  initial round of medicines.   PLAN:  1. He has already received aspirin, heparin bolus, heparin drip,      Lopressor, 4 mg of IV morphine and is on a nitroglycerin drip.      Despite this, he remains mildly hypertensive and still with 3/10      chest pressure.  This situation was discussed with the on-call      interventionalist, Dr. Juanda Chance, and decision was made to give the      patient 600 mg of Plavix and proceed to the cath lab at this time.      This was discussed in detail with the patient and his multiple      family members all of whom are in agreement.  2. Postprocedure, he will be admitted to the CCU and further risk      stratification with echocardiogram, fasting lipid panel, fasting      blood sugars and hemoglobin A1c.  3. He will need to initiate appropriate post myocardial infarction      medical regimen to include antiplatelet agent, anti-lipid therapy      and beta-blocker at a minimum.  4. Continue prednisone and asthma meds.  5. Given his prednisone dependency, the patient may have occult      hyperglycemia and we will need to be vigilant in ruling out      diabetes or prediabetes.      Christell Faith, MD  Electronically Signed     NDL/MEDQ  D:  02/18/2007  T:  02/18/2007  Job:  [id number]

## 2010-09-05 ENCOUNTER — Other Ambulatory Visit: Payer: Self-pay | Admitting: *Deleted

## 2010-09-05 MED ORDER — FLUTICASONE PROPIONATE 50 MCG/ACT NA SUSP: 2.0000 | Freq: Every day | NASAL | Status: DC

## 2010-09-06 ENCOUNTER — Other Ambulatory Visit: Payer: Self-pay | Admitting: *Deleted

## 2010-09-06 MED ORDER — CARVEDILOL 6.25 MG PO TABS: 6.2500 mg | ORAL_TABLET | Freq: Two times a day (BID) | ORAL | Status: DC

## 2010-09-06 MED ORDER — CLOPIDOGREL BISULFATE 75 MG PO TABS: 75.0000 mg | ORAL_TABLET | Freq: Every day | ORAL | Status: DC

## 2010-09-06 NOTE — Assessment & Plan Note (Signed)
[facility]                             PULMONARY OFFICE NOTE   NASIAH, [name]                       MRN:          [id number]  DATE:04/03/2006                            DOB:          1960/12/13    [name] is a 50 year old male with a history of severe persistent  asthma, chronic pansinusitis, and significant atopic features. The  patient, since his last visit, has undergone sinus surgery for the  chronic sinusitis. He is improving following surgery.   He maintains:  1. Advair 500/50 1 spray b.i.d.  2. Saline spray.  3. Prednisone 20 mg daily.  4. Singular 10 mg daily.   PHYSICAL EXAMINATION:  VITAL SIGNS: Temperature 97; blood pressure  112/76; pulse 94; saturation 96% room air.  CHEST: Clear, without evidence of wheeze or rhonchi.  CARDIAC: Regular rate and rhythm, without S3, without S4, normal S1 and  S2.  ABDOMEN: Soft; nontender.  EXTREMITIES: No edema or clubbing.  SKIN: Clear.  NEUROLOGIC: Intact.  HEENT: Showed mild nasal inflammation, but improved.   IMPRESSION:  Severe persistent asthma, status post sinus surgery for  pansinusitis, significant atopic features.   RECOMMENDATIONS:  Reduce Prednisone to 15 mg daily, resume Xolair at 300  mg, continue Advair and Singulair as dosed, and we will see the patient  back in followup in 6 weeks.     Charlcie Cradle Delford Field, MD, [facility]  Electronically Signed    PEW/MedQ  DD: 04/03/2006  DT: 04/04/2006  Job #: [id number]   cc:   Kristine Garbe. Ezzard Standing, M.D.

## 2010-09-06 NOTE — Assessment & Plan Note (Signed)
[facility]                               PULMONARY OFFICE NOTE   [name], [name]                       MRN:          [id number]  DATE:11/26/2005                            DOB:          1960-11-01    [name] is a 50 year old Muslim male, a history of severe pan-sinusitis,  severe persistent asthma, significant atopic features.  Patient now is on  Xolair 300 mg monthly.  His first injection was June.  He did miss his July  injection.  He is on Advair 500/51 spray b.i.d., Zegerid 40 mg daily,  Prednisone 20 mg daily, Singulair 10 mg daily, Flonase two sprays each  nostril daily, saline nasal spray, two sprays three times daily.  He saw  initially Dr. Jearld Fenton but could not afford the surgery Dr. Jearld Fenton offered him.  He is now going to try to see Dr. Ezzard Standing to see if he can arrange with  surgery for him for his pan-sinusitis.  CT scan of the sinuses do reveal  severe sinusitis, and there is also bony erosion in the roof of the left  frontal sinus which is quite worrisome.   PHYSICAL EXAMINATION:  VITAL SIGNS:  Temp 97, blood pressure 118/80, pulse  90, saturation 95% on room air.  Chest showed distant breath sounds without  evidence of wheeze or rhonchi.  CARDIAC:  Showed a regular rate and rhythm without S3.  Normal S1, S2.  ABDOMEN:  Soft, nontender.  EXTREMITIES:  Showed no edema or clubbing.  SKIN:  Clear.  NEUROLOGIC:  Intact.  HEENT:  Showed no jugular venous distention or lymphadenopathy.  Oropharynx  clear.  NECK:  Supple.   IMPRESSION:  Pan-sinusitis, significant atopic features, severe persistent  asthma.   PLAN:  To maintain Xolair as currently prescribed.  Maintain held medicines  as is and encouraged the patient to pursue a visit with Dr. Ezzard Standing for  consideration for sinus surgery.                                   Charlcie Cradle Delford Field, MD, FCCP   PEW/MedQ  DD:  11/27/2005  DT:  11/27/2005  Job #:  [id number]   cc:    Kristine Garbe. Ezzard Standing, MD

## 2010-09-06 NOTE — Assessment & Plan Note (Signed)
[facility]                               PULMONARY OFFICE NOTE   [name], CORBIT                       MRN:          [id number]  DATE:01/01/2006                            DOB:          1961/02/19    [name] is a 50 year old white male with severe persistent asthma, chronic  pansinusitis, significant atopic features.  He is now followed by Dr. Narda Bonds for sinusitis.  He is receiving a 4 week course of Augmentin 875 mg  b.i.d.  Patient maintains Xolair 300 mg monthly, Flonase 2 sprays each  nostril b.i.d., saline nasal spray 2-3 times daily, prednisone 10 mg daily,  Zegerid 40 mg daily, Advair 500/50 one spray b.i.d.   EXAM:  Temperature 97, blood pressure 110/78, pulse 80, saturation 97% on  room air.  CHEST:  Showed distant breath sounds, no wheeze or rhonchi.  CARDIAC EXAM:  Showed a regular rate and rhythm without S2, normal S1, S2.  ABDOMEN:  Soft, nontender.  EXTREMITIES:  Showed no edema or clubbing.  SKIN:  Clear.  NEUROLOGIC EXAM:  Intact.  NARES:  Showed improved nasal inflammation.   IMPRESSION:  1. Stable moderate to severe persistent asthma.  2. Pansinusitis which is improved.   PLAN:  1. Maintain prednisone at 10 mg daily.  2. Maintain Xolair monthly.  3. Maintain Advair as currently dosed.   Patient will finish his course of antibiotics per Dr. Ezzard Standing and then repeat  CT scanning of sinuses.                                   Charlcie Cradle Delford Field, MD, FCCP   PEW/MedQ  DD:  01/01/2006  DT:  01/02/2006  Job #:  [id number]

## 2010-09-06 NOTE — Discharge Summary (Signed)
[name], [name]              ACCOUNT NO.:  [id number]   MEDICAL RECORD NO.:  [id number]           PATIENT TYPE:   LOCATION:  5731                         FACILITY:  [facility]   PHYSICIAN:  Barth Kirks, M.D.  DATE OF BIRTH:  [birth year]   DATE OF ADMISSION:  05/21/2005  DATE OF DISCHARGE:  05/24/2005                                 DISCHARGE SUMMARY   DISCHARGE DIAGNOSES:  1.  Asthma exacerbation now improved.  2.  History of a thyroid nodule.  3.  History of a pulmonary nodule.  4.  Thrush.  5.  Sinusitis as seen on CT.   DISCHARGE MEDICATIONS:  1.  Avalox 400 mg 1 tablet every day x2 days to complete a 5-day antibiotic      course for sinusitis.  2.  Singular 10 mg 1 tablet daily.  3.  Prednisone 60 mg 1 tablet every day until seen by Dr. Leitha Schuller and then      to begin a prednisone taper beginning with a drop in 10 mg every 3 days.  4.  Albuterol nebs 2.5 mg 1 neb treatment every 4 hours x3 days then as      needed.  5.  Advair Discus 500/50 take 1 puff twice daily.  6.  Albuterol MDI with spacer 2 to 4 puffs inhaled every 4 hours as needed      for shortness of breath and not use a nebulizer treatment for a rescue      medication.   BRIEF HISTORY:  The patient is a 50 year old male, who presented to the  [facility] ER with coughing and wheezing x1 month.  He has a history of  asthma.  He started getting worse x2 weeks and has worsening shortness of  breath at rest.  He had a fever that started and a progressive cough,  positive nausea.  He has no history of intubations or hospitalizations  secondary to his asthma.  The patient had had a possible sick contact  through his whole family.  Please see the dictated H&P note for the details  on his present illness and admission.   LABORATORY DATA AND PROCEDURES:  The patient was admitted to the [facility].  Chest x-ray was performed, which showed no acute cardiopulmonary  changes.  There was a 9-mm nodule in the  left lower lobe that has increased  in size and they suggested a repeat chest CT.  A repeat chest CT revealed  that the new left lower lobe lung nodule was no longer present and has  likely resolved, but there were some new scattered __________ nodules felt  throughout __________ process.  Other entities could definitely have this  appearance.  There was a new borderline subcarinal adenopathy with probably  a mediastinal element that could favor reactive etiology.  There was also a  left thyroid lesion that was stable, but __________ was also noted.  This  was suggested on a CT chest performed by on Aug 27, 2004.  There was also a  CT of the maxillofacial without contrast.  It was noted to have pansinusitis  disease as described.  EKG revealed sinus tachycardia.  Labs also revealed a  white count of 15.9, hemoglobin of 16.7, hematocrit of 49.3, and platelets  of 197, absolute neutrophil count of 30.3.  He also had hyponatremia at 131  and 3.3 as hypokalemia on admission with a bicarb of 34 and a creatinine of  1.2.  Electrolyte studies performed on February 2nd revealed sodium of 140,  potassium of 3.4, chloride of 105, bicarb of 28, glucose of 94, BUN of 4,  creatinine of 1, calcium of 8.6.  CBC showed a white count of 4.3,  hemoglobin of 15.1, and a platelet count of 210.  Respiratory culture  revealed a normal oropharyngeal flora.  Blood cultures x2 were no growth to  date.   HOSPITAL COURSE:  The patient was admitted to [facility], was  placed on q.4h Albuterol and Atrovent nebulizer treatments.  The patient was  continued on prednisone and the dose was increased up to 60 mg daily.  The  patient was also given a one time dose of Solu-Medrol while in the emergency  room at 125 mg IV as well as Xopenex and Tylenol.  The patient was continued  on his Advair 500/50 as well as Singulair while an inpatient.  The patient  was started on Avalox 400 mg to complete a five day course and  received  three days worth of treatment while in the hospital.   PROBLEM LIST:  1.  Asthma exacerbation:  The patient received the above-meds and      respiratory therapy was at 94% saturation on oxygen at room air during      exertion and after on day of discharge.  The patient had significant      reactive airway disease and was very tight-mouthed admission, however,      improved to mild wheezes throughout and good air movement on day of      discharge.  The patient understands and was started on Albuterol MDI      spacer for a rescue medication as well as he can be continued on his      Albuterol neb treatments at home every four hours for the next three      days until he can be seen at [facility] by Dr. Leitha Schuller.  The patient will continue at home on his oral prednisone at 60      mg as well as Singulair and Advair.  He will begin a prednisone taper      once he has been seen by Dr. Leitha Schuller with recommending 10 mg decreased      increments every two to three days.  2.  Oral thrush:  The patient was started on Nystatin as an inpatient.  This      is most likely secondary due to his chronic steroid use.  The patient      was instructed on after using an oral steroid inhaler to swish and spit      out his mouthwash.  3.  Fever:  His fever resolved, and this was most likely secondary due to      his acute viral gastroenteritis.  4.  Dehydration:  The patient was started on IV fluid and maintained on      maintenance fluid x1 day, then half-maintenance fluid __________ and was      tolerating good p.o. on date of discharge.  5.  Pulmonary nodule history:  This will be followed up by Dr. Leitha Schuller.      He has had an extensive workup with a PET showing no increase in      activity.  They had recommended a repeat PET scan in three to six      months.  As seen on the CT performed while in-house say the pulmonary     nodule has now resolved and they recommend a  three to six month followup      with a CT scan.  6.  Thyroid nodule history:  Dr. Leitha Schuller has successfully worked this up      and will continue to follow this up as an outpatient.  7.  Electrolyte abnormalities:  These have resolved on date of discharge.   FOLLOWUP:  The patient be followed up at [facility] on  February 5th or 6th, to be scheduled by the patient by Dr. Leitha Schuller or as a  [facility] appointment.   DISCHARGE INSTRUCTIONS:  The patient has been instructed on the use of an  MDI as well as worsening __________ symptoms, he should come back to the ER.  The patient will continue to be on moderate bedrest and help with minimal  exertion as well as continue his Albuterol and asthma medications.  The  patient understands and acknowledges he is now ready for discharge home.      Barth Kirks, M.D.     MB/MEDQ  D:  05/24/2005  T:  05/24/2005  Job:  [id number]   cc:   Franchot Mimes, MD  Fax: [phone]

## 2010-09-06 NOTE — H&P (Signed)
[name], [name]              ACCOUNT NO.:  [id number]   MEDICAL RECORD NO.:  [id number]          PATIENT TYPE:  INP   LOCATION:  5731                         FACILITY:  [facility]   PHYSICIAN:  Pearlean Brownie, M.D.DATE OF BIRTH:  23-Jul-1960   DATE OF ADMISSION:  05/21/2005  DATE OF DISCHARGE:                                HISTORY & PHYSICAL   CHIEF COMPLAINT:  Asthma exacerbation.   HISTORY OF PRESENT ILLNESS:  Patient is a 50 year old male with a history of  asthma who comes in with wheezing for about a month.  Patient's wheezing  started getting worse two weeks ago, is worse with walking but he has  shortness of breath at rest.  Patient has a fever that started last night  without rhinorrhea, sore throat.  He does have some nausea with the fever  and his whole family has fever, nausea, and vomiting.  Patient has a  productive cough that brings up mucusy fluid, slight right ear pain.  Patient says he is thirsty and likely dehydrated.   Patient has never been intubated or had any hospitalizations for his asthma.   REVIEW OF SYSTEMS:  Negative for dizziness, chest pain, abdominal pain,  diarrhea, constipation, urinary frequency, rashes.   PAST MEDICAL HISTORY:  1.  Asthma.  2.  Thrush.   MEDICATIONS:  1.  Advair Diskus 500/50 b.i.d.  2.  Albuterol p.r.n.  3.  Prednisone 40 mg p.o. daily.  4.  Singulair 10 mg daily.   ALLERGIES:  No known drug allergies.   FAMILY HISTORY:  Mom died at 68, diabetes and brain aneurysm.  Father alive  at 80, doing well.  Five brothers.  One had MI at age 83.  Five sisters who  are relatively healthy.   SOCIAL HISTORY:  Patient lives with wife, two sons and daughter.  Recently  moved to [place] from [place] due to health problems.  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  Has no means of affording medications currently.  Patient quit smoking three years ago.  Denies alcohol or drugs.   PHYSICAL EXAMINATION:  VITAL SIGNS:   Temperature currently 102.4, pulse 162,  came down to 125 and then to 95, blood pressure 161/98, 95% on 2 L.  GENERAL:  Patient is a well-appearing male who is working hard to breathe.  He is alert and oriented.  HEENT:  Head atraumatic.  Eyes:  Equal, round, reactive to light.  Extraocular muscles intact.  Ears:  TMs with bilateral fluid, but no pus or  erythema.  Mouth and throat:  __________ white plaques suspicious for  Candida.  LUNGS:  Diffuse inspiratory and expiratory wheezing, increased work of  breathing with accessory muscle use, belly and supraclavicular muscles.  HEART:  Regular rate and rhythm.  No murmurs.  ABDOMEN:  Soft, nontender, nondistended, obese.  EXTREMITIES:  No clubbing, cyanosis, edema.  Pulses 2/2 pedal pulses.   LABORATORY DATA:  White blood cells 15.9, H&H 16.7/49.3, platelets 197.  ANC  was 13.3.  Sodium 131, potassium 3.3, chloride 101, bicarbonate 34, BUN 9,  creatinine 1.2, glucose 102.  Sputum culture  pending.  Chest x-ray:  No  acute cardiopulmonary disease, 9 mm nodule in the left lower lobe has  increased in size.  Please repeat a chest CT.  An EKG shows sinus  tachycardia.   ASSESSMENT/PLAN:  This is a 50 year old male with a history of asthma and  __________ in with an asthma exacerbation.  1.  Asthma exacerbation.  Patient has been on prednisone taper from 60 mg      down to 40.  We will restart him at 60 mg.  O2 saturations are stable      and we will continue patient on 2 L nasal cannula.  Will continue his      Advair and Singulair.  Chest x-ray is negative.  2.  Thrush.  Patient says he has had this since he has started on      prednisone.  Note, he says he has not been treated yet but it does not      bother him much.  Will treat with Nystatin 5 mL p.o. four times a day.  3.  Fever with sick contacts and nausea and vomiting.  This is likely viral      gastroenteritis.  We will treat fevers with Tylenol.  We will also get      blood and  urine cultures to rule out other infectious sources.  Chest x-      ray negative.  4.  Dehydration.  Patient working so hard to breathe, hard to take p.o.      Hemoglobin high.  Will check UA, but will also give 1 L bolus and then      maintenance intravenous fluids.  Sodium is low likely secondary to      dehydration.  5.  Pulmonary nodule.  Was 9 cm in June.  A PET shows no increased glucose      uptake activity but I recommend a repeat in three to six months given      its small size.  Will check a chest CT during the hospitalization.  6.  Decreased potassium.  40 mEq K-Dur p.o.      [name], M.D.    ______________________________  Pearlean Brownie, M.D.    [name]  D:  05/22/2005  T:  05/22/2005  Job:  [id number]

## 2010-09-06 NOTE — Op Note (Signed)
NAMECreola Mills              ACCOUNT NO.:  [id number]   MEDICAL RECORD NO.:  [id number]           PATIENT TYPE:   LOCATION:                                 FACILITY:   PHYSICIAN:  Christopher E. Ezzard Standing, M.D. DATE OF BIRTH:   DATE OF PROCEDURE:  03/10/2006  DATE OF DISCHARGE:                                 OPERATIVE REPORT   PREOP DIAGNOSES:  1. Chronic sinus disease with history of asthma.  2. Chronic otitis media.   POSTOP DIAGNOSES:  1. Chronic sinus disease with history of asthma.  2. Chronic otitis media.   OPERATION PERFORMED:  1. Functional endoscopic sinus surgery with bilateral total      ethmoidectomies.  2. Bilateral maxillary ostial enlargement.  3. Bilateral sphenoidotomies.  4. Simple inferior turbinate reductions.  5. Cleaning and irrigating areas with Ciprodex.   SURGEON:  Kristine Garbe. Ezzard Standing, M.D.   ANESTHESIA:  General endotracheal.   COMPLICATIONS:  None.   BRIEF CLINICAL NOTE:  [name] is a 50 year old gentleman who has had  long history of chronic sinus disease as well as asthma.  He has also had  intermittent ear problems with chronic drainage from his ears.  He has a  perforation in the right ear which has intermittently drained, and a  myringotomy tube in his left ear which is intermittently drained.  He had a  CT scan which shows pan sinus disease.  Also noted on the CT scan is a small  defect in the posterior frontal on the posterior frontal sinus on the left  side which is related to an accident when he was a child.  He is taken to  the operating room, at this time, for a functional endoscopic sinus surgery.   DESCRIPTION OF PROCEDURE:  After adequate endotracheal anesthesia, the nose  was prepped with Betadine, draped out, and InstaTrak was used and  calibrated.  The nose was then packed with cotton pledgets soaked in Afrin,  and middle meatus area was injected with Xylocaine with epinephrine for  local anesthetic and  hemostasis.  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  The middle turbinate was fractured medially.   The middle meatus was entered.  Uncinate process was incised with a sickle  knife and removed.  Maxillary ostia was identified and enlarged with  backbiting and straight through-cup forceps.  There were some polypoid  mucosal changes within the ethmoid area, but really no acute mucopurulent  discharge noted.  He had polypoid disease throughout his ethmoid area.  This  was removed with a microdebrider and straightened up through-cup forceps.  Maxillary ostia was enlarged.  The maxillary sinus was actually fairly  clear.  Polypoid disease was removed from the nasal frontal region.  After  opening up the posterior ethmoid area, the sphenoid sinus was likewise  opened up.  After cleaning  out the entire ethmoid area, and performing  sphenoidotomy, the right side was completed.  The nose was packed with Afrin  soaked pledgets.   Next, the left side was approached.  Again, polypoid changes were identified  within the middle meatus.  The uncinate process was incised with a sickle  knife using straight through-cups and up-cup forceps, the anterior and  posterior ethmoid area was opened up.  Care was taken to preserve the lamina  as well as the roof of the ethmoid.  The InstaTrak was used to demarcate the  lamina as well as the roof of the ethmoid.  Nasal frontal area was likewise  opened up a little bit with up through-cup forceps which were grasped with  thickened mucosa within the nasal frontal area.  Microdebrider was used to  remove additional small fragments of bone in mucosa.  The sphenoidotomy was  performed also on left side; and this was enlarged with a microdebrider.   Maxillary ostium was identified with the curved suction; and was enlarged  with a straight through-cup and back-cutting forceps.   After completing the ethmoidectomy, maxillary ostial enlargement,  sphenoidotomy, and removing some thickened debris from the nasal frontal  area.  The left side was likewise was completed.  Lastly, bipolar submucosal  cauterization of the inferior turbinates was performed; and the inferior  turbinates were outfractured.  The #0 endoscope was also used to evaluate  the eustachian tube area; and the eustachian tube area was unobstructed  bilaterally and nasal nasopharynx was clear.  Kennedy-sinus packs were  placed within the ethmoid area bilaterally, and hydrated with Xylocaine with  epinephrine.  This completed the sinus procedure.   Next, the ears were examined.  On the left-side he had a mucopurulent  discharge from the myringotomy tube on the left side.  This was cleaned out  and suctioned; and the Ciprodex drops were placed in the ear and insufflated  down through the eustachian tube into the nasopharynx.  The procedure was  visualized on the right side.  Again on the right side, the patient had a  chronic TM perforation.  Drops were placed in the ear, insufflated into the  middle ear space, and down to the eustachian tube.  This completed the  procedure.  [name] was awoke from anesthesia and transferred to the  recovery room, postop doing well.   DISPOSITION:  [name] is discharged home later this morning.   DISCHARGE MEDICATIONS:  1. Keflex 5 mg b.i.d. for 10 days.  2. Tylenol and Vicodin p.r.n. pain.  3. Ciprodex ear drops 5 drops twice a day for the next 5 days.   We will have him follow up in my office in 6 days for recheck; and have the  Kennedy-sinus packs removed.           ______________________________  Kristine Garbe Ezzard Standing, M.D.     CEN/MEDQ  D:  03/10/2006  T:  03/10/2006  Job:  [id number]  cc:   Charlcie Cradle. Delford Field, MD, FCCP

## 2010-09-06 NOTE — Assessment & Plan Note (Signed)
[facility]                             PULMONARY OFFICE NOTE   LEVITICUS, HARTON                       MRN:          [id number]  DATE:05/15/2006                            DOB:          1960/11/03    HISTORY:  [name] is a 50 year old male with history of severe  persistent asthma, pansinusitis.  He has improved since sinus surgery.  He now maintains Advair 250/50, one spray b.i.d., prednisone is tapered  to 20 mg daily, maintains Xolair 300 mg monthly, Singulair 10 mg daily.  He is using his Xopenex on a every other day basis and a rescue inhaler.   PHYSICAL EXAMINATION:  VITAL SIGNS:  Temperature 97.8, blood pressure  130/90, pulse 81, saturation 98% on room air.  CHEST:  Showed distant breath sounds with no evidence of wheeze or  rhonchi.  CARDIAC:  Exam showed a regular rate and rhythm without S3, normal S1  and S2.  ABDOMEN:  Soft, nontender.  EXTREMITIES:  Showed no edema or clubbing.  SKIN:  Clear.  NEUROLOGICAL:  Exam is intact.  HEENT:  Shows no jugular venous distention or lymphadenopathy.  Oropharynx is clear.  Nares show marked reduction in airway  inflammation.  NECK:  Supple.   IMPRESSION:  Severe persistent asthma with improved pansinusitis, status  post sinus surgery.   PLAN:  Plan is for the patient to taper the prednisone slowly down to 10  mg a day and hold.  Maintain Advair 250/50, one spray b.i.d., utilizing  Xopenex on a p.r.n. basis only, maintain Singulair at 10 mg daily, and  Xolair 300 mg monthly.  Will see the patient back in return followup in  one month.     Charlcie Cradle Delford Field, MD, [facility]  Electronically Signed    PEW/MedQ  DD: 05/15/2006  DT: 05/15/2006  Job #: [id number]

## 2010-09-06 NOTE — Assessment & Plan Note (Signed)
[name]                               PULMONARY OFFICE NOTE   [name], [name]                       MRN:          [id number]  DATE:  02/18/2006                              DOB:    HISTORY OF PRESENT ILLNESS:  [name] is a 50 year old Muslim male who has  a history of severe chronic pan sinusitis, severe persistent asthma, reflux  disease.  He is still having persistent sinus pressure.  He just finished a  month's worth of Augmentin 875 mg twice daily.  He was to have a repeat CT  scan obtained but one has not yet been ordered.   MEDICATIONS:  1. He is on Xolair 300 mg monthly.  2. Flonase two sprays each nostril b.i.d.  3. Prednisone 20 mg daily.  4. Singulair 10 mg daily.  5. ___________ 40 mg daily.  6. Advair 500/50 one spray b.i.d.   PHYSICAL EXAMINATION:  VITAL SIGNS:  Temperature 98, blood pressure 130/80,  pulse 74, saturation 96% on room air.  CHEST:  Showed distant breath sounds with no evidence of wheeze or rhonchi.  CARDIAC:  Showed a regular rate and rhythm without S3.  Normal S1/S2.  ABDOMEN:  Soft, nontender.  EXTREMITIES:  Showed no edema or clubbing.  HEENT:  Nares showed increased nasal purulence, right greater than left  naris.  Oropharynx was clear.   IMPRESSION:  Pan sinusitis with recurrence driving the patient's difficult  to control asthma.  I believe the patient would benefit from surgical  intervention.  I will discuss this with Dr. Narda Bonds in this regard.  Will repeat CT scan with sinus this day for follow-up.  He will receive  another round of Augmentin XR 2 g b.i.d. for 10 days.  Will repulse  prednisone up to 60 mg a day, tapering down by 10 mg until he gets to 30 mg  a day dose and hold.  He will maintain Advair as is and we will see the  patient back in return follow-up.     Theodore Cradle Delford Field, MD, [facility]  Electronically Signed    PEW/MedQ  DD: 02/18/2006  DT: 02/18/2006  Job #: [id number]

## 2010-09-10 ENCOUNTER — Ambulatory Visit: Payer: Self-pay | Admitting: Family Medicine

## 2010-09-17 ENCOUNTER — Other Ambulatory Visit: Payer: Self-pay | Admitting: Critical Care Medicine

## 2010-09-20 ENCOUNTER — Other Ambulatory Visit: Payer: Self-pay | Admitting: *Deleted

## 2010-09-20 MED ORDER — ATORVASTATIN CALCIUM 40 MG PO TABS: 40.0000 mg | ORAL_TABLET | Freq: Every day | ORAL | Status: DC

## 2010-09-23 ENCOUNTER — Encounter: Payer: Self-pay | Admitting: Cardiovascular Disease

## 2010-09-24 ENCOUNTER — Telehealth: Payer: Self-pay | Admitting: Cardiovascular Disease

## 2010-09-24 NOTE — Telephone Encounter (Signed)
Spoke with pharmacist and she will refill Lipitor 80 mg daily.

## 2010-09-24 NOTE — Telephone Encounter (Signed)
LVM for pharmacist to call back

## 2010-09-24 NOTE — Telephone Encounter (Signed)
Clarify lipitor 40 mg or 80 mg.

## 2010-10-10 ENCOUNTER — Ambulatory Visit (INDEPENDENT_AMBULATORY_CARE_PROVIDER_SITE_OTHER): Payer: Self-pay | Admitting: Cardiovascular Disease

## 2010-10-10 ENCOUNTER — Encounter: Payer: Self-pay | Admitting: Cardiovascular Disease

## 2010-10-10 VITALS — BP 139/92 | HR 82 | Resp 12 | Ht 68.0 in | Wt 168.0 lb

## 2010-10-10 DIAGNOSIS — I2510 Atherosclerotic heart disease of native coronary artery without angina pectoris: Secondary | ICD-10-CM

## 2010-10-10 NOTE — Assessment & Plan Note (Signed)
Stable. No changes. He can hold plavix for 5-7 days before dental procedure.

## 2010-10-10 NOTE — Progress Notes (Signed)
History of Present Illness:49 yo male with history of CAD, asthma, hyperlipidemia here today for cardiac f/u. He has been followed in the past by Dr. Juanda Chance. In 2008 he had an anterior MI treated with a DES to the LAD. His ejection fraction was 45% at that time. Myoview 2010 showed no ischemia and showed an ejection fraction of 40%.   He has had no chest pain, SOB or palpitations. He has been tolerating all medications without difficulty.   His primary care is the [facility].   Past Medical History  Diagnosis Date  . Asthma   . Dental caries   . CAD (coronary artery disease)   . Diabetes mellitus   . GERD (gastroesophageal reflux disease)   . Hyperlipidemia   . Asthma   . Thyroid nodule     biopsy negative 2006    Past Surgical History  Procedure Date  . Coronary stent placement   . Solitary pulm and thryoid nodules     Current Outpatient Prescriptions  Medication Sig Dispense Refill  . albuterol (PROVENTIL HFA;VENTOLIN HFA) 108 (90 BASE) MCG/ACT inhaler Inhale 2 puffs into the lungs every 4 (four) hours as needed for Wheezing.  1 Inhaler  1  . aspirin (ADULT ASPIRIN EC LOW STRENGTH) 81 MG EC tablet Take 81 mg by mouth daily.        Marland Kitchen atorvastatin (LIPITOR) 40 MG tablet Take 1 tablet (40 mg total) by mouth daily.  90 tablet  2  . Calcium Carbonate-Vit D-Min 600-400 MG-UNIT TABS Take 1 tablet by mouth 2 (two) times daily.       . carvedilol (COREG) 6.25 MG tablet Take 1 tablet (6.25 mg total) by mouth 2 (two) times daily.  180 tablet  1  . clopidogrel (PLAVIX) 75 MG tablet Take 1 tablet (75 mg total) by mouth daily.  90 tablet  1  . fluticasone (FLONASE) 50 MCG/ACT nasal spray 2 sprays by Nasal route daily.  16 g  3  . Fluticasone-Salmeterol (ADVAIR DISKUS) 250-50 MCG/DOSE AEPB Inhale 1 puff into the lungs every 12 (twelve) hours.        . metFORMIN (GLUCOPHAGE) 1000 MG tablet Take 1 tablet (1,000 mg total) by mouth 2 (two) times daily with a meal.  60 tablet   3  . montelukast (SINGULAIR) 10 MG tablet Take 10 mg by mouth daily.        Marland Kitchen oxyCODONE-acetaminophen (ROXICET) 5-325 MG per tablet Take 1 tablet by mouth every 4 (four) hours as needed for pain.  60 tablet  0  . predniSONE (DELTASONE) 10 MG tablet Take 10 mg by mouth daily.        Marland Kitchen DISCONTD: olmesartan (BENICAR) 20 MG tablet Take 20 mg by mouth daily.          No Known Allergies  History   Social History  . Marital Status: Married    Spouse Name: N/A    Number of Children: N/A  . Years of Education: N/A   Occupational History  . Not on file.   Social History Main Topics  . Smoking status: Former Games developer  . Smokeless tobacco: Never Used  . Alcohol Use: No  . Drug Use: No  . Sexually Active: Yes    Birth Control/ Protection: Condom   Other Topics Concern  . Not on file   Social History Narrative   Lives with wife; recently moved to [place] from [place] due to health problems. Has no means of affording meds currently +  tobacco 1/2ppd x 35yrs, no ETOH no drugs.    Family History  Problem Relation Age of Onset  . Diabetes Mother   . Heart attack Brother 36    Review of Systems:  As stated in the HPI and otherwise negative.   BP 139/92  Pulse 82  Resp 12  Ht 5\' 8"  (1.727 m)  Wt 168 lb (76.204 kg)  BMI 25.54 kg/m2  Physical Examination: General: Well developed, well nourished, NAD HEENT: OP clear, mucus membranes moist SKIN: warm, dry. No rashes. Neuro: No focal deficits Musculoskeletal: Muscle strength 5/5 all ext Psychiatric: Mood and affect normal Neck: No JVD, no carotid bruits, no thyromegaly, no lymphadenopathy. Lungs:Clear bilaterally, no wheezes, rhonci, crackles Cardiovascular: Regular rate and rhythm. No murmurs, gallops or rubs. Abdomen:Soft. Bowel sounds present. Non-tender.  Extremities: No lower extremity edema. Pulses are 2 + in the bilateral DP/PT.  ZOX:WRUEA rhythm with PACs. Rate 82 bpm. Poor R wave progression through the precordial leads.

## 2010-10-15 ENCOUNTER — Other Ambulatory Visit: Payer: Self-pay | Admitting: Family Medicine

## 2010-10-15 DIAGNOSIS — J45909 Unspecified asthma, uncomplicated: Secondary | ICD-10-CM

## 2010-10-15 MED ORDER — ALBUTEROL SULFATE HFA 108 (90 BASE) MCG/ACT IN AERS: 2.0000 | INHALATION_SPRAY | RESPIRATORY_TRACT | Status: DC | PRN

## 2010-10-15 NOTE — Telephone Encounter (Signed)
Message copied by Jennette Bill on Tue Oct 15, 2010  3:09 PM ------      Message from: Zachery Dauer      Created: Tue Oct 15, 2010  2:24 PM      Regarding: Follow-up care       Please make sure he has a follow up visit scheduled in July for DM and asthma

## 2010-10-15 NOTE — Telephone Encounter (Signed)
He can see anyone on the Red team and establish with them as a patient.

## 2010-10-15 NOTE — Telephone Encounter (Signed)
Left message with son for patient to return call. Please have him schedule appointment with Dr Sheffield Slider for follow up on DM and asthma.Busick, Rodena Medin

## 2010-10-30 ENCOUNTER — Telehealth: Payer: Self-pay | Admitting: Cardiovascular Disease

## 2010-10-30 NOTE — Telephone Encounter (Signed)
Per Asher Muir, please clarify if pt can have extraction . Will be faxing over paperwork again. Pt has appt on 7/13.

## 2010-10-30 NOTE — Telephone Encounter (Signed)
Previous PAPERWORK FILLED OUT INCORRECTLY WILL REFAX  TO OFFICE   FOR DR Clifton James TO REVIEW .[name]

## 2010-10-31 NOTE — Telephone Encounter (Signed)
New paperwork completed and faxed. Office note from October 10, 2010 faxed also.

## 2010-11-16 ENCOUNTER — Encounter: Payer: Self-pay | Admitting: Family Medicine

## 2010-12-06 ENCOUNTER — Other Ambulatory Visit: Payer: Self-pay | Admitting: *Deleted

## 2010-12-06 MED ORDER — FLUTICASONE-SALMETEROL 250-50 MCG/DOSE IN AEPB: 1.0000 | INHALATION_SPRAY | Freq: Two times a day (BID) | RESPIRATORY_TRACT | Status: DC

## 2010-12-06 MED ORDER — FLUTICASONE PROPIONATE 50 MCG/ACT NA SUSP: 2.0000 | Freq: Every day | NASAL | Status: DC

## 2010-12-17 ENCOUNTER — Other Ambulatory Visit: Payer: Self-pay | Admitting: Critical Care Medicine

## 2010-12-17 MED ORDER — MONTELUKAST SODIUM 10 MG PO TABS: 10.0000 mg | ORAL_TABLET | Freq: Every day | ORAL | Status: DC

## 2011-01-28 LAB — BASIC METABOLIC PANEL
BUN: 14
CO2: 22
CO2: 24
Chloride: 101
Chloride: 106
Creatinine, Ser: 1.03
GFR calc Af Amer: >60
Sodium: 134 — ABNORMAL LOW

## 2011-01-28 LAB — CBC
Hemoglobin: 13.5
MCHC: 33.1
MCHC: 33.9
MCV: 76.4 — ABNORMAL LOW
MCV: 77 — ABNORMAL LOW
Platelets: 254
RBC: 5.3

## 2011-01-29 LAB — LIPID PANEL
LDL Cholesterol: 97
Total CHOL/HDL Ratio: 4.3
Triglycerides: 287 — ABNORMAL HIGH
Triglycerides: 290 — ABNORMAL HIGH
VLDL: 57 — ABNORMAL HIGH
VLDL: 58 — ABNORMAL HIGH

## 2011-01-29 LAB — POCT I-STAT 3, ART BLOOD GAS (G3+)
Bicarbonate: 25.1 — ABNORMAL HIGH
Operator id: 282671
pH, Arterial: 7.429

## 2011-01-29 LAB — COMPREHENSIVE METABOLIC PANEL
ALT: 55 — ABNORMAL HIGH
AST: 329 — ABNORMAL HIGH
Alkaline Phosphatase: 50
CO2: 24
Calcium: 8.8
GFR calc Af Amer: >60
GFR calc non Af Amer: >60
Potassium: 3.1 — ABNORMAL LOW
Sodium: 135
Total Protein: 6.1

## 2011-01-29 LAB — BASIC METABOLIC PANEL
BUN: 5 — ABNORMAL LOW
CO2: 27
Chloride: 100
Glucose, Bld: 119 — ABNORMAL HIGH
Potassium: 4.1

## 2011-01-29 LAB — POCT CARDIAC MARKERS
CKMB, poc: 14.3
Myoglobin, poc: >500
Operator id: 282201
Operator id: 294341

## 2011-01-29 LAB — CBC
HCT: 45.6
MCHC: 33
MCHC: 33.2
MCV: 75.8 — ABNORMAL LOW
MCV: 76.1 — ABNORMAL LOW
Platelets: 271
Platelets: 272
RBC: 5.93 — ABNORMAL HIGH
RDW: 13.2
RDW: 13.7

## 2011-01-29 LAB — POCT I-STAT CREATININE
Creatinine, Ser: 1.1
Operator id: 294341

## 2011-01-29 LAB — HEPATIC FUNCTION PANEL
ALT: 29
AST: 22
Albumin: 3.9
Bilirubin, Direct: 0.1
Total Protein: 7.4

## 2011-01-29 LAB — I-STAT 8, (EC8 V) (CONVERTED LAB)
BUN: 10
Chloride: 106
Glucose, Bld: 212 — ABNORMAL HIGH
HCT: 49
Potassium: 3.8
pCO2, Ven: 36 — ABNORMAL LOW
pH, Ven: 7.428 — ABNORMAL HIGH

## 2011-01-29 LAB — CK TOTAL AND CKMB (NOT AT ARMC)
Total CK: 269 — ABNORMAL HIGH
Total CK: 3785 — ABNORMAL HIGH

## 2011-01-29 LAB — POCT I-STAT 3, VENOUS BLOOD GAS (G3P V)
Acid-base deficit: 4 — ABNORMAL HIGH
Operator id: 282671
pCO2, Ven: 37.5 — ABNORMAL LOW
pH, Ven: 7.359 — ABNORMAL HIGH
pO2, Ven: 31

## 2011-01-29 LAB — D-DIMER, QUANTITATIVE: D-Dimer, Quant: 0.28

## 2011-01-29 LAB — MAGNESIUM: Magnesium: 1.9

## 2011-02-13 ENCOUNTER — Other Ambulatory Visit: Payer: Self-pay | Admitting: *Deleted

## 2011-02-13 NOTE — Telephone Encounter (Signed)
Received refill request for Benicar. Not on med list. Last office visit April 2012.   Consulted with Dr. Deirdre Priest . Advised cannot fill now , patient must be seen first. Called patient's home and left message to return call.  Called [facility] pharmacy and denied refill request and ask to have patient call us for appointment.Marland Kitchen

## 2011-02-27 ENCOUNTER — Other Ambulatory Visit: Payer: Self-pay | Admitting: *Deleted

## 2011-02-27 MED ORDER — FLUTICASONE PROPIONATE 50 MCG/ACT NA SUSP: 2.0000 | Freq: Every day | NASAL | Status: DC

## 2011-02-27 MED ORDER — FLUTICASONE-SALMETEROL 250-50 MCG/DOSE IN AEPB: 1.0000 | INHALATION_SPRAY | Freq: Two times a day (BID) | RESPIRATORY_TRACT | Status: DC

## 2011-03-07 ENCOUNTER — Other Ambulatory Visit: Payer: Self-pay | Admitting: *Deleted

## 2011-03-07 MED ORDER — CARVEDILOL 6.25 MG PO TABS: 6.2500 mg | ORAL_TABLET | Freq: Two times a day (BID) | ORAL | Status: DC

## 2011-03-21 ENCOUNTER — Encounter: Payer: Self-pay | Admitting: Critical Care Medicine

## 2011-03-21 ENCOUNTER — Ambulatory Visit (INDEPENDENT_AMBULATORY_CARE_PROVIDER_SITE_OTHER): Payer: Self-pay | Admitting: Critical Care Medicine

## 2011-03-21 DIAGNOSIS — H7290 Unspecified perforation of tympanic membrane, unspecified ear: Secondary | ICD-10-CM

## 2011-03-21 DIAGNOSIS — H6590 Unspecified nonsuppurative otitis media, unspecified ear: Secondary | ICD-10-CM

## 2011-03-21 DIAGNOSIS — Z23 Encounter for immunization: Secondary | ICD-10-CM

## 2011-03-21 DIAGNOSIS — J45909 Unspecified asthma, uncomplicated: Secondary | ICD-10-CM

## 2011-03-21 MED ORDER — CEFDINIR 300 MG PO CAPS: 300.0000 mg | ORAL_CAPSULE | Freq: Two times a day (BID) | ORAL | Status: AC

## 2011-03-21 NOTE — Patient Instructions (Signed)
Omnicef 300mg  Twice daily  For 7 days  Flu shot today  We are referring you to ENT to evaluate your ear.  Continue on Advair and Singulair  Continue on Prednisone 10mg  daily  follow up Dr. Delford Field  In 4 months and As needed
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
## 2011-03-21 NOTE — Assessment & Plan Note (Signed)
Compensated on present regimen  Cont w/ rx assistance program  No change in chronic steroids   Plan  Cont on current regimen.

## 2011-03-21 NOTE — Assessment & Plan Note (Signed)
Right serous OM with TM rupture  Previous pt of Dr. Ezzard Standing -ENT - will begin [facility] and refer to ENT   Plan:  omnicef 300mg  Twice daily  X 7 days

## 2011-04-02 ENCOUNTER — Telehealth: Payer: Self-pay | Admitting: Critical Care Medicine

## 2011-04-02 NOTE — Telephone Encounter (Signed)
Spoke with pharmacy just needed clarification  on the directions  For the [facility]

## 2011-05-09 ENCOUNTER — Other Ambulatory Visit: Payer: Self-pay | Admitting: Critical Care Medicine

## 2011-05-12 ENCOUNTER — Telehealth: Payer: Self-pay | Admitting: Critical Care Medicine

## 2011-05-12 MED ORDER — PREDNISONE 10 MG PO TABS: 10.0000 mg | ORAL_TABLET | Freq: Every day | ORAL | Status: DC

## 2011-05-12 NOTE — Telephone Encounter (Signed)
Spoke with pt's daughter to verify the msg.  Refill for prednisone was sent to pharm.

## 2011-05-13 ENCOUNTER — Telehealth: Payer: Self-pay | Admitting: *Deleted

## 2011-05-13 NOTE — Telephone Encounter (Signed)
Received refill request for proventil. Consulted with Dr. Deirdre Priest and he advises patient must be seen for further refills. Called and spoke with patient and he is out of metformin also. Has been using his wife's medication. Appointment scheduled tomorrow for med refill.

## 2011-05-14 ENCOUNTER — Ambulatory Visit (INDEPENDENT_AMBULATORY_CARE_PROVIDER_SITE_OTHER): Payer: Self-pay | Admitting: Family Medicine

## 2011-05-14 ENCOUNTER — Encounter: Payer: Self-pay | Admitting: Family Medicine

## 2011-05-14 VITALS — BP 146/84 | HR 97 | Temp 97.9°F | Ht 68.0 in | Wt 178.0 lb

## 2011-05-14 DIAGNOSIS — H6590 Unspecified nonsuppurative otitis media, unspecified ear: Secondary | ICD-10-CM

## 2011-05-14 DIAGNOSIS — E168 Other specified disorders of pancreatic internal secretion: Secondary | ICD-10-CM

## 2011-05-14 DIAGNOSIS — J45909 Unspecified asthma, uncomplicated: Secondary | ICD-10-CM

## 2011-05-14 DIAGNOSIS — E781 Pure hyperglyceridemia: Secondary | ICD-10-CM

## 2011-05-14 MED ORDER — METFORMIN HCL 1000 MG PO TABS: 1000.0000 mg | ORAL_TABLET | Freq: Two times a day (BID) | ORAL | Status: DC

## 2011-05-14 MED ORDER — CIPROFLOXACIN-DEXAMETHASONE 0.3-0.1 % OT SUSP: 4.0000 [drp] | Freq: Two times a day (BID) | OTIC | Status: DC

## 2011-05-14 MED ORDER — ALBUTEROL SULFATE HFA 108 (90 BASE) MCG/ACT IN AERS: 2.0000 | INHALATION_SPRAY | RESPIRATORY_TRACT | Status: DC | PRN

## 2011-05-14 NOTE — Progress Notes (Signed)
  Subjective:    Patient ID: [name], male    DOB: 1961/03/24, 51 y.o.   MRN: [id number]  HPI [name] is a 51 y.o. male with a history of diabetes mellitus reportedly steroid induced, hx CAD s/p MI, asthma requiring chronic prednisone reporting for a same day visit for medication refills on albuterol and metformin. He has not had a visit in the last 9 months to discuss diabetes. And is also complaining of his ear draining pus so these issues were addressed as well.   1. DM-last hgb A1c 7.6 in April 2012, 9.7 at this visit.  Blood Sugars per patient-checks 2-3x/week. 140-150 in morning. 200 after meals Last eye exam-over 2 years ago2 Last foot exam-patient unsure  ROS-negative for Polyuria, Neuropathy, Paresthesias, hypoglycemia symptoms Does describe occasional Polydipsia, also has some vision changes but says this is related to being far sighted and says he needs to see an eye doctor  2. RIght ear drainage. Patient reports ear draining pus for 3 days. He has a history of bilateral tympanostomy tubes which have fallen out. He gets recurrent ear infections and used to see an ENT doctor. Per patient, he has been fired from [employer] due to insurance concerns (had seen them for about a year). Says doctor recommended a surgery that would cost thousands that patient could not afford. Patient states requires antibiotics approximately every 2 months. Patient last finished treatment about a month ago.  ROS-no fevers/chills/fatigue/malaise/nausea/vomiting/recent weight change  Review of Systemsnegative except as noted in HPI       Objective:   Physical Exam  Vitals reviewed. Constitutional: He is oriented to person, place, and time. He appears well-developed and well-nourished. No distress.  HENT:  Head: Normocephalic and atraumatic.  Right Ear: There is drainage. Tympanic membrane is scarred, perforated and erythematous.  Left Ear: External ear normal. Tympanic membrane is scarred and  perforated. Tympanic membrane is not erythematous.  Ears:  Eyes: Conjunctivae and EOM are normal. Pupils are equal, round, and reactive to light.  Neck: Normal range of motion. Neck supple.  Cardiovascular: Normal rate and regular rhythm.  Exam reveals no gallop and no friction rub.   No murmur heard. Pulmonary/Chest: Effort normal and breath sounds normal. No respiratory distress. He has no rales.  Abdominal: Soft. Bowel sounds are normal. He exhibits no distension. There is no tenderness.  Musculoskeletal: Normal range of motion. He exhibits no edema.  Neurological: He is alert and oriented to person, place, and time.  Diabetic monofilament foot exam was performed without deficits. Unsure of where to enter this appropriately for epic.     Assessment & Plan:

## 2011-05-14 NOTE — Patient Instructions (Addendum)
I would like you to take the ear drops as instructed for 1 week. If you start to have fevers/chills/fatigue, you should be seen sooner For your diabetes, you need to see an eye doctor within the next month.  I have refilled your albuterol and metformin today.  You need to see your regular doctor to review all of your medical problems in 3 months.  You need to exercise at least 3x a week for 30 minutes as long as your asthma doesn't affect you.   Thanks,  Dr. Durene Cal  Addendum: written on patient instructions by hand Patient to see pulmonology for interest in changing prednisone Needs to be seen in 1 month for blood pressure check and health maintenance

## 2011-05-15 LAB — COMPREHENSIVE METABOLIC PANEL
ALT: 27 U/L (ref 0–53)
CO2: 25 mEq/L (ref 19–32)
Calcium: 9.3 mg/dL (ref 8.4–10.5)
Chloride: 103 mEq/L (ref 96–112)
Potassium: 4.8 mEq/L (ref 3.5–5.3)
Sodium: 137 mEq/L (ref 135–145)
Total Protein: 6.3 g/dL (ref 6.0–8.3)

## 2011-05-15 LAB — LDL CHOLESTEROL, DIRECT: Direct LDL: 45 mg/dL

## 2011-05-15 NOTE — Assessment & Plan Note (Signed)
Will refill albuterol today. Patient requesting change in asthma medications so he can stop taking steroids but he still requires albuterol twice weekly. Deferred decision on medication change to pulmonologist as they have been managing chronic steroid use.

## 2011-05-15 NOTE — Assessment & Plan Note (Addendum)
Treat with 7 day course of ciprodex as patient with previous tympanostomy tubes and no systemic side effects. Alerted patient to red flags. If persists, could consider repeat referral to ENT through project access. Need to obtain records from ENT concerning possible surgery reported by patient.

## 2011-05-15 NOTE — Assessment & Plan Note (Signed)
LDL <70 and Well controlled on statin and patient tolerating. Could consider fasting lipids to monitor triglycerides.

## 2011-05-15 NOTE — Assessment & Plan Note (Addendum)
Refilled metformin. A1c has increased by almost 2. Patient is on steroids but not high dose. Patient will need an appointment with PCP/regular team for increase in medication. Patient will likely require insulin as already on max dose metformin and sulfonylurea may only improve a1c by approximately 1. Patient may also benefit from diabetic education courses.  -blood pressure not at goal for diabetic -see overview for other comments

## 2011-05-16 ENCOUNTER — Ambulatory Visit: Payer: Self-pay | Admitting: Family Medicine

## 2011-05-24 ENCOUNTER — Encounter (HOSPITAL_COMMUNITY): Payer: Self-pay | Admitting: Emergency Medicine

## 2011-05-24 ENCOUNTER — Emergency Department (INDEPENDENT_AMBULATORY_CARE_PROVIDER_SITE_OTHER)
Admission: EM | Admit: 2011-05-24 | Discharge: 2011-05-24 | Disposition: A | Discharge status: Home / Self Care | Payer: Self-pay | Source: Home / Self Care

## 2011-05-24 DIAGNOSIS — J019 Acute sinusitis, unspecified: Secondary | ICD-10-CM

## 2011-05-24 DIAGNOSIS — H6501 Acute serous otitis media, right ear: Secondary | ICD-10-CM

## 2011-05-24 DIAGNOSIS — H6500 Acute serous otitis media, unspecified ear: Secondary | ICD-10-CM

## 2011-05-24 MED ORDER — AMOXICILLIN-POT CLAVULANATE 875-125 MG PO TABS: 1.0000 | ORAL_TABLET | Freq: Two times a day (BID) | ORAL | Status: AC

## 2011-05-24 NOTE — ED Notes (Signed)
Onset 1/27 of uri symptoms.  Green and dark secretions from sinus, generalized body pain.  C/o right fear pain.  Patient has not had a fever, no nausea, no vomiting, no diarrhea

## 2011-05-24 NOTE — ED Provider Notes (Signed)
Medical screening examination/treatment/procedure(s) were performed by non-physician practitioner and as supervising physician I was immediately available for consultation/collaboration.  LANEY,RONNIE   Ronnie Laney, MD 05/24/11 1936 

## 2011-05-24 NOTE — ED Provider Notes (Signed)
History     CSN: [id number]  Arrival date & time 05/24/11  1316   None     Chief Complaint  Patient presents with  . URI    (Consider location/radiation/quality/duration/timing/severity/associated sxs/prior treatment) HPI Comments: Edmon presents today with c/o thick green nasal drainage, sinus pressure and cough x one week. He also c/o drainage from his Rt ear. He has a hx of chronic ear problems and was recently treated by his PCP with antibiotic otic gtts without improvement. He admits to chills and body aches but denies fever or sore throat.    Past Medical History  Diagnosis Date  . Asthma   . Dental caries   . CAD (coronary artery disease)   . Diabetes mellitus   . GERD (gastroesophageal reflux disease)   . Hyperlipidemia   . Asthma   . Thyroid nodule     biopsy negative 2006    Past Surgical History  Procedure Date  . Coronary stent placement   . Solitary pulm and thryoid nodules     Family History  Problem Relation Age of Onset  . Diabetes Mother   . Heart attack Brother 36    History  Substance Use Topics  . Smoking status: Former Smoker -- 0.5 packs/day for 22 years    Types: Cigarettes    Quit date: 04/21/2006  . Smokeless tobacco: Never Used  . Alcohol Use: No      Review of Systems  Constitutional: Positive for chills. Negative for fever and fatigue.  HENT: Positive for congestion, rhinorrhea, postnasal drip, sinus pressure and ear discharge. Negative for ear pain, sore throat and sneezing.   Respiratory: Positive for cough. Negative for shortness of breath and wheezing.   Cardiovascular: Negative for chest pain and palpitations.  Musculoskeletal: Positive for myalgias.    Allergies  Review of patient's allergies indicates no known allergies.  Home Medications   Current Outpatient Rx  Name Route Sig Dispense Refill  . ALBUTEROL SULFATE HFA 108 (90 BASE) MCG/ACT IN AERS Inhalation Inhale 2 puffs into the lungs every 4 (four) hours as  needed for wheezing (can take 4 puffs if you feel like you need nebulizer). 1 Inhaler 2    No more refills until seen  . AMOXICILLIN-POT CLAVULANATE 875-125 MG PO TABS Oral Take 1 tablet by mouth 2 (two) times daily with a meal. 20 tablet 0  . ASPIRIN 81 MG PO TBEC Oral Take 81 mg by mouth daily.      . ATORVASTATIN CALCIUM 40 MG PO TABS Oral Take 1 tablet (40 mg total) by mouth daily. 90 tablet 2  . CALCIUM CARBONATE-VIT D-MIN 600-400 MG-UNIT PO TABS Oral Take 1 tablet by mouth 2 (two) times daily.     Marland Kitchen CARVEDILOL 6.25 MG PO TABS Oral Take 1 tablet (6.25 mg total) by mouth 2 (two) times daily. 180 tablet 1  . CLOPIDOGREL BISULFATE 75 MG PO TABS Oral Take 1 tablet (75 mg total) by mouth daily. 90 tablet 1  . FLUTICASONE PROPIONATE 50 MCG/ACT NA SUSP Nasal Place 2 sprays into the nose daily. PATIENT NEEDS AN APPOINTMENT FOR FURTHER REFILLS 16 g 0  . FLUTICASONE-SALMETEROL 250-50 MCG/DOSE IN AEPB Inhalation Inhale 1 puff into the lungs every 12 (twelve) hours.      Marland Kitchen METFORMIN HCL 1000 MG PO TABS Oral Take 1 tablet (1,000 mg total) by mouth 2 (two) times daily with a meal. 60 tablet 3  . MONTELUKAST SODIUM 10 MG PO TABS Oral Take 1 tablet (10  mg total) by mouth daily. 30 tablet 0    Needs appt with Dr Delford Field for refills  . PREDNISONE 10 MG PO TABS Oral Take 1 tablet (10 mg total) by mouth daily. 30 tablet 2    BP 136/72  Pulse 102  Temp(Src) 99.8 F (37.7 C) (Oral)  Resp 20  SpO2 98%  Physical Exam  Nursing note and vitals reviewed. Constitutional: He appears well-developed and well-nourished. No distress.  HENT:  Head: Normocephalic and atraumatic.  Right Ear: External ear and ear canal normal. Tympanic membrane is perforated.  Left Ear: Tympanic membrane, external ear and ear canal normal.  Nose: Nose normal.  Mouth/Throat: Uvula is midline, oropharynx is clear and moist and mucous membranes are normal. No oropharyngeal exudate, posterior oropharyngeal edema or posterior oropharyngeal  erythema.       Small amount of purulent drainage noted from Rt EAC. No swelling or erythema.   Neck: Neck supple.  Cardiovascular: Normal rate, regular rhythm and normal heart sounds.   Pulmonary/Chest: Effort normal and breath sounds normal. No respiratory distress.  Lymphadenopathy:    He has no cervical adenopathy.  Neurological: He is alert.  Skin: Skin is warm and dry.  Psychiatric: He has a normal mood and affect.    ED Course  Procedures (including critical care time)  Labs Reviewed - No data to display No results found.   1. Acute sinusitis   2. Acute serous otitis media of right ear       MDM  One week of sinus congestion and drainage. Chronic perforated Rt TM with drainage. Failed recent treatment with otic abx gtts.         Melody Comas, [place] 05/24/11 1600

## 2011-05-29 ENCOUNTER — Other Ambulatory Visit: Payer: Self-pay | Admitting: Allergy

## 2011-05-29 ENCOUNTER — Other Ambulatory Visit: Payer: Self-pay | Admitting: *Deleted

## 2011-05-29 MED ORDER — PREDNISONE 10 MG PO TABS: 10.0000 mg | ORAL_TABLET | Freq: Every day | ORAL | Status: DC

## 2011-05-29 MED ORDER — FLUTICASONE-SALMETEROL 250-50 MCG/DOSE IN AEPB: 1.0000 | INHALATION_SPRAY | Freq: Two times a day (BID) | RESPIRATORY_TRACT | Status: DC

## 2011-05-29 MED ORDER — FLUTICASONE PROPIONATE 50 MCG/ACT NA SUSP: 2.0000 | Freq: Every day | NASAL | Status: DC

## 2011-05-29 NOTE — Telephone Encounter (Signed)
rx  For advair and flonase [facility]

## 2011-06-19 ENCOUNTER — Encounter: Payer: Self-pay | Admitting: Family Medicine

## 2011-06-19 ENCOUNTER — Ambulatory Visit (INDEPENDENT_AMBULATORY_CARE_PROVIDER_SITE_OTHER): Payer: Self-pay | Admitting: Family Medicine

## 2011-06-19 DIAGNOSIS — J45901 Unspecified asthma with (acute) exacerbation: Secondary | ICD-10-CM

## 2011-06-19 DIAGNOSIS — H6690 Otitis media, unspecified, unspecified ear: Secondary | ICD-10-CM | POA: Insufficient documentation

## 2011-06-19 DIAGNOSIS — E781 Pure hyperglyceridemia: Secondary | ICD-10-CM

## 2011-06-19 DIAGNOSIS — H6590 Unspecified nonsuppurative otitis media, unspecified ear: Secondary | ICD-10-CM

## 2011-06-19 MED ORDER — PREDNISONE 20 MG PO TABS: 40.0000 mg | ORAL_TABLET | Freq: Every day | ORAL | Status: AC

## 2011-06-19 MED ORDER — AMOXICILLIN-POT CLAVULANATE 875-125 MG PO TABS: 1.0000 | ORAL_TABLET | Freq: Two times a day (BID) | ORAL | Status: AC

## 2011-06-19 NOTE — Progress Notes (Signed)
Patient ID: [name], male   DOB: 1960-10-14, 51 y.o.   MRN: [id number] [name] is a 51 y.o. male who presents to [facility] today for   Cough present now for 2 weeks.  It initially started with right ear drainage and now has a dry persistent cough.  He denies any shortness of breath but does note occasional wheezing. He denies any chest pain fevers or chills. He is eating and drinking normally.   PMH reviewed. Significant for MI and  asthma ROS as above otherwise neg Medications reviewed. Current Outpatient Prescriptions  Medication Sig Dispense Refill  . albuterol (PROVENTIL HFA;VENTOLIN HFA) 108 (90 BASE) MCG/ACT inhaler Inhale 2 puffs into the lungs every 4 (four) hours as needed for wheezing (can take 4 puffs if you feel like you need nebulizer).  1 Inhaler  2  . aspirin (ADULT ASPIRIN EC LOW STRENGTH) 81 MG EC tablet Take 81 mg by mouth daily.        Marland Kitchen atorvastatin (LIPITOR) 40 MG tablet Take 1 tablet (40 mg total) by mouth daily.  90 tablet  2  . Calcium Carbonate-Vit D-Min 600-400 MG-UNIT TABS Take 1 tablet by mouth 2 (two) times daily.       . carvedilol (COREG) 6.25 MG tablet Take 1 tablet (6.25 mg total) by mouth 2 (two) times daily.  180 tablet  1  . clopidogrel (PLAVIX) 75 MG tablet Take 1 tablet (75 mg total) by mouth daily.  90 tablet  1  . fluticasone (FLONASE) 50 MCG/ACT nasal spray Place 2 sprays into the nose daily. PATIENT NEEDS AN APPOINTMENT FOR FURTHER REFILLS  16 g  6  . Fluticasone-Salmeterol (ADVAIR) 250-50 MCG/DOSE AEPB Inhale 1 puff into the lungs every 12 (twelve) hours.  60 each  6  . metFORMIN (GLUCOPHAGE) 1000 MG tablet Take 1 tablet (1,000 mg total) by mouth 2 (two) times daily with a meal.  60 tablet  3  . montelukast (SINGULAIR) 10 MG tablet Take 1 tablet (10 mg total) by mouth daily.  30 tablet  0  . predniSONE (DELTASONE) 10 MG tablet Take 1 tablet (10 mg total) by mouth daily.  30 tablet  2  . amoxicillin-clavulanate (AUGMENTIN) 875-125 MG per tablet  Take 1 tablet by mouth 2 (two) times daily.  14 tablet  0  . predniSONE (DELTASONE) 20 MG tablet Take 2 tablets (40 mg total) by mouth daily.  14 tablet  0    Exam:  BP 128/85  Pulse 59  Ht 5\' 8"  (1.727 m)  Wt 174 lb (78.926 kg)  BMI 26.46 kg/m2 Gen: Well NAD HEENT: EOMI,  MMM right tympanic membrane with perforation without drainage. Left tympanic membrane with no obvious perforation but small collection of pus behind the membrane present. No erythema bilaterally Lungs: CTABL Nl WOB slightly prolonged expiratory phase Heart: RRR no MRG Abd: NABS, NT, ND Exts: Non edematous BL  LE, warm and well perfused.

## 2011-06-19 NOTE — Patient Instructions (Signed)
Thank you for coming in today. Take the augmentin twice a day for 1 week.  Take the prednisone 2 pill daily for 1 week.  See me in 2-4 weeks for a check up.  If you can please come back next week in the morning before you eat for labs.

## 2011-06-19 NOTE — Assessment & Plan Note (Signed)
Cough is likely a mild asthma exacerbation. Plan to treat with albuterol and prednisone.  Additionally in treating tympanic membrane with Augmentin which would help most of the causes of bronchitis.

## 2011-06-19 NOTE — Assessment & Plan Note (Signed)
Plan to treat with Augmentin. He NT has said that he would likely require surgery. I'll be happy to do an evaluation for preop clearance if he requires it.

## 2011-06-20 ENCOUNTER — Ambulatory Visit: Payer: Self-pay | Admitting: Family Medicine

## 2011-06-24 ENCOUNTER — Other Ambulatory Visit: Payer: Self-pay

## 2011-06-24 DIAGNOSIS — E781 Pure hyperglyceridemia: Secondary | ICD-10-CM

## 2011-06-24 LAB — LIPID PANEL
HDL: 52 mg/dL (ref >39)
LDL Cholesterol: 40 mg/dL (ref 0–99)
Total CHOL/HDL Ratio: 2 Ratio
Triglycerides: 61 mg/dL (ref <150)
VLDL: 12 mg/dL (ref 0–40)

## 2011-06-24 NOTE — Progress Notes (Signed)
FLP DONE TODAY [name] 

## 2011-08-19 ENCOUNTER — Other Ambulatory Visit: Payer: Self-pay

## 2011-08-19 MED ORDER — CARVEDILOL 6.25 MG PO TABS: 6.2500 mg | ORAL_TABLET | Freq: Two times a day (BID) | ORAL | Status: DC

## 2011-09-01 ENCOUNTER — Other Ambulatory Visit: Payer: Self-pay | Admitting: Critical Care Medicine

## 2011-09-01 ENCOUNTER — Other Ambulatory Visit: Payer: Self-pay | Admitting: Cardiovascular Disease

## 2011-09-01 MED ORDER — CARVEDILOL 6.25 MG PO TABS: 6.2500 mg | ORAL_TABLET | Freq: Two times a day (BID) | ORAL | Status: DC

## 2011-10-06 ENCOUNTER — Encounter (HOSPITAL_COMMUNITY): Payer: Self-pay

## 2011-10-06 ENCOUNTER — Emergency Department (INDEPENDENT_AMBULATORY_CARE_PROVIDER_SITE_OTHER)
Admission: EM | Admit: 2011-10-06 | Discharge: 2011-10-06 | Disposition: A | Discharge status: Home Health | Payer: Self-pay | Source: Home / Self Care | Attending: Emergency Medicine | Admitting: Emergency Medicine

## 2011-10-06 DIAGNOSIS — H6690 Otitis media, unspecified, unspecified ear: Secondary | ICD-10-CM

## 2011-10-06 DIAGNOSIS — J209 Acute bronchitis, unspecified: Secondary | ICD-10-CM

## 2011-10-06 DIAGNOSIS — J45909 Unspecified asthma, uncomplicated: Secondary | ICD-10-CM

## 2011-10-06 MED ORDER — HYDROCOD POLST-CHLORPHEN POLST 10-8 MG/5ML PO LQCR: 5.0000 mL | Freq: Two times a day (BID) | ORAL | Status: DC | PRN

## 2011-10-06 MED ORDER — AMOXICILLIN-POT CLAVULANATE 875-125 MG PO TABS: 1.0000 | ORAL_TABLET | Freq: Two times a day (BID) | ORAL | Status: AC

## 2011-10-06 NOTE — ED Notes (Signed)
Pt c/o R ear pain and productive cough with nasal congestion for past few days.  Pt states yellow sputum.  Pt denies fever.  Pt states he has sore throat as well.  Pt using nebs at home.

## 2011-10-06 NOTE — Discharge Instructions (Signed)

## 2011-10-06 NOTE — ED Provider Notes (Signed)
Chief Complaint  Patient presents with  . Otalgia  . Cough    History of Present Illness:   The patient is a 51 year old male with multiple chronic medical problems including diabetes, hypertriglyceridemia, coronary artery disease, asthma, and chronic otitis media. He presents today with a 2 to three-day history of pain in his right ear. He's had no drainage. No fever or chills. He has had a cough productive of green sputum and nasal congestion with greenish drainage. His throat is somewhat sore. For asthma he remains on albuterol nebulizer every 4 hours, prednisone 20 mg per day, and Advair 50/250 2 puffs twice daily. His asthma is controlled and he denies any wheezing right now. Benign  Review of Systems:  Other than noted above, the patient denies any of the following symptoms. Systemic:  No fever, chills, sweats, fatigue, myalgias, headache, or anorexia. Eye:  No redness, pain or drainage. ENT:  No earache, ear congestion, nasal congestion, sneezing, rhinorrhea, sinus pressure, sinus pain, post nasal drip, or sore throat. Lungs:  No cough, sputum production, wheezing, shortness of breath, or chest pain. GI:  No abdominal pain, nausea, vomiting, or diarrhea. Skin:  No rash or itching.  PMFSH:  Past medical history, family history, social history, meds, and allergies were reviewed.  Physical Exam:   Vital signs:  BP 140/87  Pulse 78  Temp 98.3 F (36.8 C) (Oral)  Resp 26  SpO2 100% General:  Alert, in no distress. Eye:  No conjunctival injection or drainage. Lids were normal. ENT:  There is a small perforation in the left tympanic membrane and slight inflammation. The right tympanic membrane has a large perforation and it's impossible to see any landmarks because of inflammation and scarring. There was no drainage.  Nasal mucosa was clear and uncongested, without drainage.  Mucous membranes were moist.  Pharynx was clear, without exudate or drainage.  There were no oral ulcerations or  lesions. Neck:  Supple, no adenopathy, tenderness or mass. Lungs:  No respiratory distress.  Auscultation of lungs reveals scattered, bilateral expiratory wheezes without rales or rhonchi.  Breath sounds were otherwise clear and equal bilaterally. Lungs were resonant to percussion.  No egophony. Heart:  Regular rhythm, without gallops, murmers or rubs. Skin:  Clear, warm, and dry, without rash or lesions.  Assessment:  The primary encounter diagnosis was Chronic otitis media. Diagnoses of Acute bronchitis and Asthma were also pertinent to this visit.  Plan:   1.  The following meds were prescribed:   New Prescriptions   AMOXICILLIN-CLAVULANATE (AUGMENTIN) 875-125 MG PER TABLET    Take 1 tablet by mouth 2 (two) times daily.   CHLORPHENIRAMINE-HYDROCODONE (TUSSIONEX) 10-8 MG/5ML LQCR    Take 5 mLs by mouth every 12 (twelve) hours as needed.   2.  The patient was instructed in symptomatic care and handouts were given. 3.  The patient was told to return if becoming worse in any way, if no better in 3 or 4 days, and given some red flag symptoms that would indicate earlier return.   Reuben Likes, MD 10/06/11 2137

## 2011-11-11 ENCOUNTER — Ambulatory Visit (INDEPENDENT_AMBULATORY_CARE_PROVIDER_SITE_OTHER): Payer: Self-pay | Admitting: Cardiovascular Disease

## 2011-11-11 ENCOUNTER — Encounter: Payer: Self-pay | Admitting: Cardiovascular Disease

## 2011-11-11 VITALS — BP 125/82 | HR 79 | Ht 68.0 in | Wt 172.0 lb

## 2011-11-11 DIAGNOSIS — I255 Ischemic cardiomyopathy: Secondary | ICD-10-CM

## 2011-11-11 DIAGNOSIS — I2589 Other forms of chronic ischemic heart disease: Secondary | ICD-10-CM

## 2011-11-11 DIAGNOSIS — I2510 Atherosclerotic heart disease of native coronary artery without angina pectoris: Secondary | ICD-10-CM

## 2011-11-11 MED ORDER — CARVEDILOL 6.25 MG PO TABS: 6.2500 mg | ORAL_TABLET | Freq: Two times a day (BID) | ORAL | Status: DC

## 2011-11-11 NOTE — Assessment & Plan Note (Signed)
He is on good medical therapy. Continue ASA, beta blocker and ARB. Volume status is ok.

## 2011-11-11 NOTE — Assessment & Plan Note (Addendum)
Stable. Will continue ASA. Will stop Plavix. Will continue Coreg and ARB. Lipids well controlled on statin. BP is well controlled.

## 2011-11-11 NOTE — Patient Instructions (Addendum)
Your physician has recommended you make the following change in your medication: STOP your Plavix.  Your physician wants you to follow-up in: 1 year.  You will receive a reminder letter in the mail two months in advance. If you don't receive a letter, please call our office to schedule the follow-up appointment.

## 2011-11-11 NOTE — Progress Notes (Signed)
History of Present Illness: 51 yo male with history of CAD, asthma, hyperlipidemia here today for cardiac f/u. He has been followed in the past by Dr. Juanda Chance.I saw him last in June 2012.  In 2008 he had an anterior MI treated with a DES to the LAD. His ejection fraction was 45% at that time. Myoview 2010 showed no ischemia and showed an ejection fraction of 40%.   He is here today for follow up. He has had no chest pain, SOB or palpitations. He has been tolerating all medications without difficulty.   His primary care is the [facility].    Last Lipid Profile:  Lipid Panel     Component Value Date/Time   CHOL 104 06/24/2011 1109   TRIG 61 06/24/2011 1109   HDL 52 06/24/2011 1109   CHOLHDL 2.0 06/24/2011 1109   VLDL 12 06/24/2011 1109   LDLCALC 40 06/24/2011 1109     Past Medical History  Diagnosis Date  . Asthma   . Dental caries   . CAD (coronary artery disease)   . Diabetes mellitus   . GERD (gastroesophageal reflux disease)   . Hyperlipidemia   . Asthma   . Thyroid nodule     biopsy negative 2006    Past Surgical History  Procedure Date  . Coronary stent placement   . Solitary pulm and thryoid nodules     Current Outpatient Prescriptions  Medication Sig Dispense Refill  . albuterol (PROVENTIL HFA;VENTOLIN HFA) 108 (90 BASE) MCG/ACT inhaler Inhale 2 puffs into the lungs every 4 (four) hours as needed for wheezing (can take 4 puffs if you feel like you need nebulizer).  1 Inhaler  2  . aspirin (ADULT ASPIRIN EC LOW STRENGTH) 81 MG EC tablet Take 81 mg by mouth daily.        Marland Kitchen atorvastatin (LIPITOR) 40 MG tablet Take 1 tablet (40 mg total) by mouth daily.  90 tablet  2  . Calcium Carbonate-Vit D-Min 600-400 MG-UNIT TABS Take 1 tablet by mouth 2 (two) times daily.       . carvedilol (COREG) 6.25 MG tablet Take 1 tablet (6.25 mg total) by mouth 2 (two) times daily.  180 tablet  1  . chlorpheniramine-HYDROcodone (TUSSIONEX) 10-8 MG/5ML LQCR Take 5 mLs by mouth  every 12 (twelve) hours as needed.  140 mL  0  . clopidogrel (PLAVIX) 75 MG tablet Take 1 tablet (75 mg total) by mouth daily.  90 tablet  1  . fluticasone (FLONASE) 50 MCG/ACT nasal spray Place 2 sprays into the nose daily. PATIENT NEEDS AN APPOINTMENT FOR FURTHER REFILLS  16 g  6  . Fluticasone-Salmeterol (ADVAIR) 250-50 MCG/DOSE AEPB Inhale 1 puff into the lungs every 12 (twelve) hours.  60 each  6  . metFORMIN (GLUCOPHAGE) 1000 MG tablet Take 1 tablet (1,000 mg total) by mouth 2 (two) times daily with a meal.  60 tablet  3  . montelukast (SINGULAIR) 10 MG tablet Take 1 tablet (10 mg total) by mouth daily.  30 tablet  0  . predniSONE (DELTASONE) 10 MG tablet Take 1 tablet (10 mg total) by mouth daily.  30 tablet  2  . predniSONE (DELTASONE) 10 MG tablet TAKE ONE TABLET BY MOUTH EVERY DAY  30 tablet  3    No Known Allergies  History   Social History  . Marital Status: Married    Spouse Name: N/A    Number of Children: N/A  . Years of Education: N/A  Occupational History  . Not on file.   Social History Main Topics  . Smoking status: Former Smoker -- 0.5 packs/day for 22 years    Types: Cigarettes    Quit date: 04/21/2006  . Smokeless tobacco: Never Used  . Alcohol Use: No  . Drug Use: No  . Sexually Active: Yes    Birth Control/ Protection: Condom   Other Topics Concern  . Not on file   Social History Narrative   Lives with wife; recently moved to [place] from [place] due to health problems. Has no means of affording meds currently + tobacco 1/2ppd x 87yrs, no ETOH no drugs.    Family History  Problem Relation Age of Onset  . Diabetes Mother   . Heart attack Brother 36    Review of Systems:  As stated in the HPI and otherwise negative.   BP 125/82  Pulse 79  Ht 5\' 8"  (1.727 m)  Wt 172 lb (78.019 kg)  BMI 26.15 kg/m2  Physical Examination: General: Well developed, well nourished, NAD HEENT: OP clear, mucus membranes moist SKIN: warm, dry. No rashes. Neuro: No  focal deficits Musculoskeletal: Muscle strength 5/5 all ext Psychiatric: Mood and affect normal Neck: No JVD, no carotid bruits, no thyromegaly, no lymphadenopathy. Lungs:Clear bilaterally, no wheezes, rhonci, crackles Cardiovascular: Regular rate and rhythm. No murmurs, gallops or rubs. Abdomen:Soft. Bowel sounds present. Non-tender.  Extremities: No lower extremity edema. Pulses are 2 + in the bilateral DP/PT.  EKG: NSR, rate 79 bpm. Non-specific ST and T changes.

## 2011-11-19 ENCOUNTER — Encounter: Payer: Self-pay | Admitting: Critical Care Medicine

## 2011-11-19 ENCOUNTER — Ambulatory Visit (INDEPENDENT_AMBULATORY_CARE_PROVIDER_SITE_OTHER): Payer: Self-pay | Admitting: Critical Care Medicine

## 2011-11-19 VITALS — BP 112/70 | HR 83 | Temp 98.2°F | Ht 68.0 in | Wt 174.0 lb

## 2011-11-19 DIAGNOSIS — J45909 Unspecified asthma, uncomplicated: Secondary | ICD-10-CM

## 2011-11-19 MED ORDER — MONTELUKAST SODIUM 10 MG PO TABS: 10.0000 mg | ORAL_TABLET | Freq: Every day | ORAL | Status: DC

## 2011-11-19 MED ORDER — PREDNISONE 10 MG PO TABS: 10.0000 mg | ORAL_TABLET | Freq: Every day | ORAL | Status: DC

## 2011-11-19 NOTE — Patient Instructions (Signed)
No change in medications. Return in        6 months        
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
## 2011-11-20 NOTE — Assessment & Plan Note (Signed)
Severe persistent asthma steroid dependent stable at this time Chronic sinusitis has been a major driver of symptom complex in the past Plan Maintain inhaled medications as prescribed Maintain low-dose prednisone Return 6 months

## 2012-01-19 ENCOUNTER — Ambulatory Visit (INDEPENDENT_AMBULATORY_CARE_PROVIDER_SITE_OTHER): Payer: Self-pay | Admitting: Family Medicine

## 2012-01-19 ENCOUNTER — Telehealth: Payer: Self-pay | Admitting: Family Medicine

## 2012-01-19 ENCOUNTER — Encounter: Payer: Self-pay | Admitting: Family Medicine

## 2012-01-19 VITALS — BP 135/89 | HR 98 | Temp 98.1°F | Ht 68.0 in | Wt 170.4 lb

## 2012-01-19 DIAGNOSIS — J069 Acute upper respiratory infection, unspecified: Secondary | ICD-10-CM

## 2012-01-19 MED ORDER — BENZONATATE 100 MG PO CAPS: 100.0000 mg | ORAL_CAPSULE | Freq: Three times a day (TID) | ORAL | Status: DC | PRN

## 2012-01-19 NOTE — Patient Instructions (Addendum)
I think your symptoms are caused by a viral upper respiratory infection. These infections can last for up to 7 to 10 days. They are unfortunately not helped by antibiotics. Continue doing what you are doing. I can prescribe some medicine for the cough called tessalon pearls. You can also take tussionex over the counter for the cough. You can also take 3 days of Afrin nasal spray to help with the runny nose. Do not take it for more than 3 days.  If you have worsening shortness of breath, elevated fever or symptoms are getting worst and not better, please come back.    Upper Respiratory Infection, Adult An upper respiratory infection (URI) is also sometimes known as the common cold. The upper respiratory tract includes the nose, sinuses, throat, trachea, and bronchi. Bronchi are the airways leading to the lungs. Most people improve within 1 week, but symptoms can last up to 2 weeks. A residual cough may last even longer.  CAUSES Many different viruses can infect the tissues lining the upper respiratory tract. The tissues become irritated and inflamed and often become very moist. Mucus production is also common. A cold is contagious. You can easily spread the virus to others by oral contact. This includes kissing, sharing a glass, coughing, or sneezing. Touching your mouth or nose and then touching a surface, which is then touched by another person, can also spread the virus. SYMPTOMS  Symptoms typically develop 1 to 3 days after you come in contact with a cold virus. Symptoms vary from person to person. They may include:  Runny nose.   Sneezing.   Nasal congestion.   Sinus irritation.   Sore throat.   Loss of voice (laryngitis).   Cough.   Fatigue.   Muscle aches.   Loss of appetite.   Headache.   Low-grade fever.  DIAGNOSIS  You might diagnose your own cold based on familiar symptoms, since most people get a cold 2 to 3 times a year. Your caregiver can confirm this based on your  exam. Most importantly, your caregiver can check that your symptoms are not due to another disease such as strep throat, sinusitis, pneumonia, asthma, or epiglottitis. Blood tests, throat tests, and X-rays are not necessary to diagnose a common cold, but they may sometimes be helpful in excluding other more serious diseases. Your caregiver will decide if any further tests are required. RISKS AND COMPLICATIONS  You may be at risk for a more severe case of the common cold if you smoke cigarettes, have chronic heart disease (such as heart failure) or lung disease (such as asthma), or if you have a weakened immune system. The very young and very old are also at risk for more serious infections. Bacterial sinusitis, middle ear infections, and bacterial pneumonia can complicate the common cold. The common cold can worsen asthma and chronic obstructive pulmonary disease (COPD). Sometimes, these complications can require emergency medical care and may be life-threatening. PREVENTION  The best way to protect against getting a cold is to practice good hygiene. Avoid oral or hand contact with people with cold symptoms. Wash your hands often if contact occurs. There is no clear evidence that vitamin C, vitamin E, echinacea, or exercise reduces the chance of developing a cold. However, it is always recommended to get plenty of rest and practice good nutrition. TREATMENT  Treatment is directed at relieving symptoms. There is no cure. Antibiotics are not effective, because the infection is caused by a virus, not by bacteria. Treatment may  include:  Increased fluid intake. Sports drinks offer valuable electrolytes, sugars, and fluids.   Breathing heated mist or steam (vaporizer or shower).   Eating chicken soup or other clear broths, and maintaining good nutrition.   Getting plenty of rest.   Using gargles or lozenges for comfort.   Controlling fevers with ibuprofen or acetaminophen as directed by your caregiver.     Increasing usage of your inhaler if you have asthma.  Zinc gel and zinc lozenges, taken in the first 24 hours of the common cold, can shorten the duration and lessen the severity of symptoms. Pain medicines may help with fever, muscle aches, and throat pain. A variety of non-prescription medicines are available to treat congestion and runny nose. Your caregiver can make recommendations and may suggest nasal or lung inhalers for other symptoms.  HOME CARE INSTRUCTIONS   Only take over-the-counter or prescription medicines for pain, discomfort, or fever as directed by your caregiver.   Use a warm mist humidifier or inhale steam from a shower to increase air moisture. This may keep secretions moist and make it easier to breathe.   Drink enough water and fluids to keep your urine clear or pale yellow.   Rest as needed.   Return to work when your temperature has returned to normal or as your caregiver advises. You may need to stay home longer to avoid infecting others. You can also use a face mask and careful hand washing to prevent spread of the virus.  SEEK MEDICAL CARE IF:   After the first few days, you feel you are getting worse rather than better.   You need your caregiver's advice about medicines to control symptoms.   You develop chills, worsening shortness of breath, or brown or red sputum. These may be signs of pneumonia.   You develop yellow or brown nasal discharge or pain in the face, especially when you bend forward. These may be signs of sinusitis.   You develop a fever, swollen neck glands, pain with swallowing, or white areas in the back of your throat. These may be signs of strep throat.  SEEK IMMEDIATE MEDICAL CARE IF:   You have a fever.   You develop severe or persistent headache, ear pain, sinus pain, or chest pain.   You develop wheezing, a prolonged cough, cough up blood, or have a change in your usual mucus (if you have chronic lung disease).   You develop sore  muscles or a stiff neck.  Document Released: 10/01/2000 Document Revised: 03/27/2011 Document Reviewed: 08/09/2010 [facility] Patient Information 2012 [place], [place].

## 2012-01-19 NOTE — Assessment & Plan Note (Signed)
No evidence of focal bacterial infection. Despite the report of drainage of the left ear, there was no evidence of infection with the left ear appearing similar to right. No evidence of bacterial sinusitis. Lungs sounded clear without wheezing. Will treat symptomatically with tesalon pearls for cough and afrin for congestion. See avs for red flags for return.

## 2012-01-19 NOTE — Telephone Encounter (Signed)
Appt scheduled on [facility] overflow clinic for today at 2:30pm.  Gaylene Brooks, RN

## 2012-01-19 NOTE — Telephone Encounter (Signed)
Pt is asking to be seen today - sinus prob and cough

## 2012-01-19 NOTE — Progress Notes (Signed)
Patient ID: [name], male   DOB: [birth year], 51 y.o.   MRN: [id number] Patient ID: [name]    DOB: August 09, 1960, 51 y.o.   MRN: [id number] --- Subjective:  [name] is a 51 y.o.male who presents with 3 days of cough, nasal congestion and sore throat.  - reports yellow phlegm productive, non bloody cough x3 days associated with rhinorrhea and sore throat. He also noticed some left ear drainage. Denies any fever or chills. No sick contacts at home. He has been taking tylenol flu and cold which has been helping. He has also been taking albuterol every 4 hours as needed for wheezing which has helped. He denies any shortness of breath but does sometimes hear himself wheeze. He continues taking his allergy and asthma medicine as prescribed.  He feels like overall his symptoms have been improving since they started.   Otherwise, denies nausea, vomiting, abdominal pain, myalgias.   ROS: see HPI Past Medical History: reviewed and updated medications and allergies. Social History: Tobacco: denies   Objective: Filed Vitals:   01/19/12 1506  BP: 135/89  Pulse: 98  Temp: 98.1 F (36.7 C)    Physical Examination:   General appearance - alert, well appearing, and in no distress Ears - dull TM bilaterally, non bulging TM Nose - edematous and erythematous nasal turbinates bilaterally. No maxillary sinus tenderness.  Mouth - mucous membranes moist, erythematous posterior oropharynx but no tonsillar exudate Neck - supple, no significant adenopathy Chest - clear to auscultation, no wheezes, rales or rhonchi, symmetric air entry, normal work of breathing, speaks in full sentences Heart - normal rate, regular rhythm, normal S1, S2, no murmurs, rubs, clicks or gallops Abdomen - soft, nontender, nondistended, no masses or organomegaly Extremities - peripheral pulses normal, no pedal edema

## 2012-04-05 ENCOUNTER — Other Ambulatory Visit: Payer: Self-pay | Admitting: Critical Care Medicine

## 2012-04-05 NOTE — Telephone Encounter (Signed)
Also needs advair 250/ 50

## 2012-04-06 MED ORDER — FLUTICASONE PROPIONATE 50 MCG/ACT NA SUSP: 2.0000 | Freq: Every day | NASAL | Status: DC

## 2012-04-06 MED ORDER — FLUTICASONE-SALMETEROL 250-50 MCG/DOSE IN AEPB: 1.0000 | INHALATION_SPRAY | Freq: Two times a day (BID) | RESPIRATORY_TRACT | Status: DC

## 2012-04-06 NOTE — Telephone Encounter (Signed)
Both rx's were called into [facility] Department.

## 2012-04-15 ENCOUNTER — Ambulatory Visit (INDEPENDENT_AMBULATORY_CARE_PROVIDER_SITE_OTHER): Payer: Self-pay | Admitting: Family Medicine

## 2012-04-15 ENCOUNTER — Encounter: Payer: Self-pay | Admitting: Family Medicine

## 2012-04-15 VITALS — BP 134/83 | HR 95 | Temp 97.7°F | Ht 68.0 in | Wt 169.4 lb

## 2012-04-15 DIAGNOSIS — E168 Other specified disorders of pancreatic internal secretion: Secondary | ICD-10-CM

## 2012-04-15 DIAGNOSIS — J45901 Unspecified asthma with (acute) exacerbation: Secondary | ICD-10-CM | POA: Insufficient documentation

## 2012-04-15 MED ORDER — PREDNISONE 20 MG PO TABS: 60.0000 mg | ORAL_TABLET | Freq: Every day | ORAL | Status: DC

## 2012-04-15 MED ORDER — ATORVASTATIN CALCIUM 40 MG PO TABS: 40.0000 mg | ORAL_TABLET | Freq: Every day | ORAL | Status: DC

## 2012-04-15 MED ORDER — METFORMIN HCL 1000 MG PO TABS: 1000.0000 mg | ORAL_TABLET | Freq: Two times a day (BID) | ORAL | Status: DC

## 2012-04-15 NOTE — Assessment & Plan Note (Signed)
Mild to moderate without significant dyspnea at rest.   Is on and seems to be taking max preventive therapy.  Given chronic oral steroids will give boost and long taper.  He assures me he has access to his medications

## 2012-04-15 NOTE — Patient Instructions (Addendum)
For the ear and nose stuffiness - Use Afrin or generic nasal spray 2 puffs twice daily for 3 days only  For the wheezing Use Prednisone  60 mg daily for 5 days then  40 mg daily for 5 days then  20 mg daily for 5 days then   Back to 10 mg daily  If you are not better in 3-4 days or if you are getting worse then come back   Come in to see Dr Ashley Royalty in January for a diabetes check

## 2012-04-15 NOTE — Progress Notes (Signed)
  Subjective:    Patient ID: [name], male    DOB: Aug 26, 1960, 51 y.o.   MRN: [id number]  HPI  Wheezing Has been having increased wheezing with cough and mild shortness of breath with exertion for last week.  Not severe, no fever or sputum or edema.  Is using his albuterol every 2-4 hours.  Taking all other asthma medications as prescribed.  PMH - on chronic oral prednisone 10 mg daily.  Last burst was about 2 mo ag0  R ear and nose fullness For last few days.  No pain or discharge or bleeding    Review of Systems     Objective:   Physical Exam  Alert no acute distress POx 96%on room air Lungs:  Normal respiratory effort, chest expands symmetrically. Has bilateral mild moderate wheezing on expiration.  No dullness Heart - Regular rate and rhythm.  No murmurs, gallops or rubs.    Ears:  Has cloudy TM on R.  Enlarged nasal turbinates bilaterally.    Extremities:  No cyanosis, edema, or deformity noted with good range of motion of all major joints.        Assessment & Plan:

## 2012-05-17 ENCOUNTER — Ambulatory Visit: Payer: Self-pay | Admitting: Critical Care Medicine

## 2012-05-19 ENCOUNTER — Ambulatory Visit: Payer: Self-pay | Admitting: Critical Care Medicine

## 2012-06-22 ENCOUNTER — Other Ambulatory Visit: Payer: Self-pay | Admitting: *Deleted

## 2012-06-22 MED ORDER — CARVEDILOL 6.25 MG PO TABS: 6.2500 mg | ORAL_TABLET | Freq: Two times a day (BID) | ORAL | Status: DC

## 2012-07-12 ENCOUNTER — Ambulatory Visit: Payer: Self-pay | Admitting: Critical Care Medicine

## 2012-07-28 ENCOUNTER — Encounter: Payer: Self-pay | Admitting: Family Medicine

## 2012-07-28 ENCOUNTER — Ambulatory Visit (INDEPENDENT_AMBULATORY_CARE_PROVIDER_SITE_OTHER): Payer: No Typology Code available for payment source | Admitting: Family Medicine

## 2012-07-28 VITALS — BP 139/80 | HR 84 | Temp 98.8°F | Ht 67.5 in | Wt 168.0 lb

## 2012-07-28 DIAGNOSIS — E119 Type 2 diabetes mellitus without complications: Secondary | ICD-10-CM

## 2012-07-28 DIAGNOSIS — E168 Other specified disorders of pancreatic internal secretion: Secondary | ICD-10-CM

## 2012-07-28 DIAGNOSIS — H6091 Unspecified otitis externa, right ear: Secondary | ICD-10-CM | POA: Insufficient documentation

## 2012-07-28 DIAGNOSIS — H60399 Other infective otitis externa, unspecified ear: Secondary | ICD-10-CM

## 2012-07-28 DIAGNOSIS — IMO0002 Reserved for concepts with insufficient information to code with codable children: Secondary | ICD-10-CM

## 2012-07-28 LAB — COMPREHENSIVE METABOLIC PANEL
AST: 20 U/L (ref 0–37)
Alkaline Phosphatase: 51 U/L (ref 39–117)
BUN: 10 mg/dL (ref 6–23)
Creat: 0.97 mg/dL (ref 0.50–1.35)
Glucose, Bld: 228 mg/dL — ABNORMAL HIGH (ref 70–99)
Potassium: 4.8 mEq/L (ref 3.5–5.3)
Total Bilirubin: 0.3 mg/dL (ref 0.3–1.2)

## 2012-07-28 MED ORDER — ATORVASTATIN CALCIUM 40 MG PO TABS: 40.0000 mg | ORAL_TABLET | Freq: Every day | ORAL | Status: DC

## 2012-07-28 MED ORDER — NEOMYCIN-POLYMYXIN-HC 3.5-10000-1 OT SOLN: 4.0000 [drp] | Freq: Four times a day (QID) | OTIC | Status: DC

## 2012-07-28 NOTE — Progress Notes (Signed)
  Subjective:    Patient ID: [name], male    DOB: 1960/05/14, 52 y.o.   MRN: [id number]  HPI  1. DM: CHRONIC DIABETES  Disease Monitoring  Blood Sugar Ranges: 90-100 fasting, 140-150 post prandial  Polyuria: no   Visual problems: yes   Medication Compliance: yes  Medication Side Effects  Hypoglycemia: no   Preventitive Health Care  Eye Exam: Had at [facility], told he had a problem that needed follow up at ophtho    2. Ear Drainage:  Reports R ear drainage x1 week.  Drainage is purulent with some blood mixed in.  Has had external ear infections in the past.  He did have some pain initially but this has improved.  Hearing is not affected.  He denies fever.  Risk factors are DM and chronic steroid use.   Review of Systems Per HPI    Objective:   Physical Exam  Constitutional: He appears well-nourished. No distress.  HENT:  Head: Normocephalic and atraumatic.  R ear with purulent drainage.  Canal is erythematous with little debris.  TM appears normal.  L TM and canal normal  Cardiovascular: Normal rate and regular rhythm.   Pulmonary/Chest: Effort normal and breath sounds normal.  Musculoskeletal: He exhibits no edema.          Assessment & Plan:

## 2012-07-28 NOTE — Assessment & Plan Note (Addendum)
A1c drastically improved from previous check.  Will not change medications at this time.  He did see an eye doctor (optometrist) and was told he needed to f/u with ophthalmology.  Will place referral. Will address Ace-I vs microalbumin at next appt.

## 2012-07-28 NOTE — Assessment & Plan Note (Signed)
Immunosupressed with chronic steroids and DM.  Will treat with cortisporin drops as this is the only drop listed on the [facility] formulary and I don't believe he could afford ciprodex.  Follow up in two weeks to recheck or sooner if worsening.

## 2012-07-28 NOTE — Patient Instructions (Addendum)
Pick up the antibiotic for you ear from the health department, use for 10 days Follow up in two weeks or sooner if worsening I will let you know about your lab work when it returns or we can discuss at your return appointment.

## 2012-07-29 LAB — CBC
HCT: 37.1 % — ABNORMAL LOW (ref 39.0–52.0)
Hemoglobin: 12.1 g/dL — ABNORMAL LOW (ref 13.0–17.0)
MCH: 21.3 pg — ABNORMAL LOW (ref 26.0–34.0)
MCV: 65.2 fL — ABNORMAL LOW (ref 78.0–100.0)
Platelets: 269 10*3/uL (ref 150–400)
RBC: 5.69 MIL/uL (ref 4.22–5.81)
WBC: 14.7 10*3/uL — ABNORMAL HIGH (ref 4.0–10.5)

## 2012-08-05 ENCOUNTER — Encounter: Payer: No Typology Code available for payment source | Admitting: Family Medicine

## 2012-08-11 ENCOUNTER — Ambulatory Visit (INDEPENDENT_AMBULATORY_CARE_PROVIDER_SITE_OTHER): Payer: No Typology Code available for payment source | Admitting: Family Medicine

## 2012-08-11 ENCOUNTER — Encounter: Payer: Self-pay | Admitting: Family Medicine

## 2012-08-11 VITALS — BP 129/84 | HR 89 | Temp 98.3°F | Resp 17 | Wt 169.9 lb

## 2012-08-11 DIAGNOSIS — E168 Other specified disorders of pancreatic internal secretion: Secondary | ICD-10-CM

## 2012-08-11 DIAGNOSIS — E119 Type 2 diabetes mellitus without complications: Secondary | ICD-10-CM

## 2012-08-11 DIAGNOSIS — H6091 Unspecified otitis externa, right ear: Secondary | ICD-10-CM

## 2012-08-11 DIAGNOSIS — H60399 Other infective otitis externa, unspecified ear: Secondary | ICD-10-CM

## 2012-08-11 MED ORDER — LISINOPRIL 2.5 MG PO TABS: 2.5000 mg | ORAL_TABLET | Freq: Every day | ORAL | Status: DC

## 2012-08-11 NOTE — Progress Notes (Signed)
  Subjective:    Patient ID: Gurnie Duris, male    DOB: 1960-09-10, 52 y.o.   MRN: [id number]  HPI  1. Otitis externa:  Follow up of otitis externa.  Reports that he completed course of antibiotics and that his symptoms have resolved.  No further drainage or pain.  Denies fever, chills.    2. DM:  DM has been well controlled with last A1c 7.1.  He is currently not on an ACE-I.  No clear reason why he is not taking this, renal function normal and no history of cough related to ace-i.  Does not appear to have had microalbumin in the past either.      Review of Systems Per HPI    Objective:   Physical Exam  Constitutional: He appears well-nourished. No distress.  HENT:  Head: Normocephalic and atraumatic.  L ear canal without erythema, debris or drainage.  TM appears normal  R ear canal without erythema, debris or drainage.  TM appears normal.    Lymphadenopathy:    He has no cervical adenopathy.          Assessment & Plan:

## 2012-08-11 NOTE — Assessment & Plan Note (Signed)
Resolved, return prn for reoccurence.

## 2012-08-11 NOTE — Patient Instructions (Signed)
Thank you for coming in today, it was good to see you Your ears look much better today.  I have sent in an medication call lisinopril.  This medication is used to protect the kidneys in diabetes.   Return for a lab visit in two weeks to recheck your kidney function.

## 2012-08-11 NOTE — Progress Notes (Signed)
Patient here for follow up from left side ear infection

## 2012-08-11 NOTE — Assessment & Plan Note (Signed)
Started on Low dose lisinopril for renal ppx.  Instructed to return in two weeks for a lab visit to check creatinine

## 2012-09-17 ENCOUNTER — Other Ambulatory Visit: Payer: No Typology Code available for payment source

## 2012-09-17 DIAGNOSIS — E119 Type 2 diabetes mellitus without complications: Secondary | ICD-10-CM

## 2012-09-17 LAB — BASIC METABOLIC PANEL
CO2: 23 mEq/L (ref 19–32)
Calcium: 9.3 mg/dL (ref 8.4–10.5)
Glucose, Bld: 129 mg/dL — ABNORMAL HIGH (ref 70–99)
Potassium: 4.1 mEq/L (ref 3.5–5.3)
Sodium: 136 mEq/L (ref 135–145)

## 2012-10-28 ENCOUNTER — Other Ambulatory Visit: Payer: Self-pay

## 2012-10-28 ENCOUNTER — Telehealth: Payer: Self-pay | Admitting: *Deleted

## 2012-10-28 DIAGNOSIS — J45909 Unspecified asthma, uncomplicated: Secondary | ICD-10-CM

## 2012-10-28 MED ORDER — ALBUTEROL SULFATE HFA 108 (90 BASE) MCG/ACT IN AERS: 2.0000 | INHALATION_SPRAY | RESPIRATORY_TRACT | Status: DC | PRN

## 2012-10-28 NOTE — Telephone Encounter (Signed)
I have already reviewed the patients chart, and it looks like he does need the Proventil for asthma maintenance therapy. I am the patient's new PCP, and have not met him yet. At this point, I will go ahead and re-order the previous Proventil Rx, but would like to have the patient come in for a follow-up visit in 2-4 weeks, or sooner if needed. He was last seen about 3 months ago, and back in 2013 for asthma related complaints.  Could you please return patient's call to inform about refill and schedule follow-up apt.  Thanks Lanora Manis! - Sofie Hartigan

## 2012-10-28 NOTE — Telephone Encounter (Signed)
Requesting refill on proventil - looks like it has been discontinued?please advise Wyatt Haste, RN-BSN

## 2012-10-28 NOTE — Telephone Encounter (Signed)
Called pt and appointment scheduled for physical,review of meds and to meet new dr on July 17 at 230. Wyatt Haste, RN-BSN

## 2012-11-02 ENCOUNTER — Other Ambulatory Visit: Payer: Self-pay | Admitting: *Deleted

## 2012-11-02 MED ORDER — CARVEDILOL 6.25 MG PO TABS: 6.2500 mg | ORAL_TABLET | Freq: Two times a day (BID) | ORAL | Status: DC

## 2012-11-04 ENCOUNTER — Ambulatory Visit (INDEPENDENT_AMBULATORY_CARE_PROVIDER_SITE_OTHER): Payer: No Typology Code available for payment source | Admitting: Family Medicine

## 2012-11-04 ENCOUNTER — Encounter: Payer: Self-pay | Admitting: Family Medicine

## 2012-11-04 VITALS — BP 117/78 | HR 72 | Ht 67.5 in | Wt 164.0 lb

## 2012-11-04 DIAGNOSIS — I2510 Atherosclerotic heart disease of native coronary artery without angina pectoris: Secondary | ICD-10-CM

## 2012-11-04 DIAGNOSIS — J45909 Unspecified asthma, uncomplicated: Secondary | ICD-10-CM

## 2012-11-04 DIAGNOSIS — H6091 Unspecified otitis externa, right ear: Secondary | ICD-10-CM

## 2012-11-04 DIAGNOSIS — IMO0002 Reserved for concepts with insufficient information to code with codable children: Secondary | ICD-10-CM

## 2012-11-04 DIAGNOSIS — E168 Other specified disorders of pancreatic internal secretion: Secondary | ICD-10-CM

## 2012-11-04 DIAGNOSIS — H60399 Other infective otitis externa, unspecified ear: Secondary | ICD-10-CM

## 2012-11-04 MED ORDER — FLUTICASONE-SALMETEROL 250-50 MCG/DOSE IN AEPB: 1.0000 | INHALATION_SPRAY | Freq: Two times a day (BID) | RESPIRATORY_TRACT | Status: DC

## 2012-11-04 MED ORDER — PREDNISONE 10 MG PO TABS: 10.0000 mg | ORAL_TABLET | Freq: Every day | ORAL | Status: DC

## 2012-11-04 MED ORDER — ATORVASTATIN CALCIUM 40 MG PO TABS: 40.0000 mg | ORAL_TABLET | Freq: Every day | ORAL | Status: DC

## 2012-11-04 NOTE — Assessment & Plan Note (Signed)
Stable. Followed by Pulmonology. No respiratory complaints today. Continue Albuterol and Advair for maintenance. Continue Prednisone 10mg , will follow recommendations by Pulmonology at this time. Provided refills for Advair and Prednisone, until able to see Pulm.
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
## 2012-11-04 NOTE — Assessment & Plan Note (Signed)
Stable. Followed by Cardiology. No chest pain or discomfort today. Will refill Lipitor for 2 months until apt in September.

## 2012-11-04 NOTE — Patient Instructions (Addendum)
Thanks for coming in today. It was good to meet you!  Today we discussed your medications and overall medical history.  I'm going to refill your Advair and Prednisone for 2 months until you can be seen by your Pulmonologist. Also, I will refill your Lipitor for 2 months until you can see your Cardiologist.  Some important numbers from today's visit: HgA1C - 7.0 (improved from 07/2012, 7.1) - Keep up the good work. Continue to exercise, and your diet sounds good. BP - 117/78 - Great!  Please schedule a follow-up appointment with me in 6 months for your Diabetes, but please call sooner if you have another issue that needs to be addressed sooner.  We will submit the referral to both ENT and Ophthalmology today. Due to wait times, it will take approximately 3 months for ENT and 6 months for Ophtho. We will contact you further with this information.  If you have any other questions or concerns, please feel free to call the clinic to contact me. You may also schedule another appointment if necessary.  However, if your symptoms get significantly worse, please go to the Emergency Department to seek immediate medical attention.  Saralyn Pilar, DO [facility] MedicineDiabetes Meal Planning Guide The diabetes meal planning guide is a tool to help you plan your meals and snacks. It is important for people with diabetes to manage their blood glucose (sugar) levels. Choosing the right foods and the right amounts throughout your day will help control your blood glucose. Eating right can even help you improve your blood pressure and reach or maintain a healthy weight. CARBOHYDRATE COUNTING MADE EASY When you eat carbohydrates, they turn to sugar. This raises your blood glucose level. Counting carbohydrates can help you control this level so you feel better. When you plan your meals by counting carbohydrates, you can have more flexibility in what you eat and balance your medicine with your food  intake. Carbohydrate counting simply means adding up the total amount of carbohydrate grams in your meals and snacks. Try to eat about the same amount at each meal. Foods with carbohydrates are listed below. Each portion below is 1 carbohydrate serving or 15 grams of carbohydrates. Ask your dietician how many grams of carbohydrates you should eat at each meal or snack. Grains and Starches  1 slice bread.   English muffin or hotdog/hamburger bun.   cup cold cereal (unsweetened).   cup cooked pasta or rice.   cup starchy vegetables (corn, potatoes, peas, beans, winter squash).  1 tortilla (6 inches).   bagel.  1 waffle or pancake (size of a CD).   cup cooked cereal.  4 to 6 small crackers. *Whole grain is recommended. Fruit  1 cup fresh unsweetened berries, melon, papaya, pineapple.  1 small fresh fruit.   banana or mango.   cup fruit juice (4 oz unsweetened).   cup canned fruit in natural juice or water.  2 tbs dried fruit.  12 to 15 grapes or cherries. Milk and Yogurt  1 cup fat-free or 1% milk.  1 cup soy milk.  6 oz light yogurt with sugar-free sweetener.  6 oz low-fat soy yogurt.  6 oz plain yogurt. Vegetables  1 cup raw or  cup cooked is counted as 0 carbohydrates or a "free" food.  If you eat 3 or more servings at 1 meal, count them as 1 carbohydrate serving. Other Carbohydrates   oz chips or pretzels.   cup ice cream or frozen yogurt.  cup sherbet or sorbet.  2 inch square cake, no frosting.  1 tbs honey, sugar, jam, jelly, or syrup.  2 small cookies.  3 squares of graham crackers.  3 cups popcorn.  6 crackers.  1 cup broth-based soup.  Count 1 cup casserole or other mixed foods as 2 carbohydrate servings.  Foods with less than 20 calories in a serving may be counted as 0 carbohydrates or a "free" food. You may want to purchase a book or computer software that lists the carbohydrate gram counts of different foods. In  addition, the nutrition facts panel on the labels of the foods you eat are a good source of this information. The label will tell you how big the serving size is and the total number of carbohydrate grams you will be eating per serving. Divide this number by 15 to obtain the number of carbohydrate servings in a portion. Remember, 1 carbohydrate serving equals 15 grams of carbohydrate. SERVING SIZES Measuring foods and serving sizes helps you make sure you are getting the right amount of food. The list below tells how big or small some common serving sizes are.  1 oz.........4 stacked dice.  3 oz........Marland KitchenDeck of cards.  1 tsp.......Marland KitchenTip of little finger.  1 tbs......Marland KitchenMarland KitchenThumb.  2 tbs.......Marland KitchenGolf ball.   cup......Marland KitchenHalf of a fist.  1 cup.......Marland KitchenA fist. SAMPLE DIABETES MEAL PLAN Below is a sample meal plan that includes foods from the grain and starches, dairy, vegetable, fruit, and meat groups. A dietician can individualize a meal plan to fit your calorie needs and tell you the number of servings needed from each food group. However, controlling the total amount of carbohydrates in your meal or snack is more important than making sure you include all of the food groups at every meal. You may interchange carbohydrate containing foods (dairy, starches, and fruits). The meal plan below is an example of a 2000 calorie diet using carbohydrate counting. This meal plan has 17 carbohydrate servings. Breakfast  1 cup oatmeal (2 carb servings).   cup light yogurt (1 carb serving).  1 cup blueberries (1 carb serving).   cup almonds. Snack  1 large apple (2 carb servings).  1 low-fat string cheese stick. Lunch  Chicken breast salad.  1 cup spinach.   cup chopped tomatoes.  2 oz chicken breast, sliced.  2 tbs low-fat [place] dressing.  12 whole-wheat crackers (2 carb servings).  12 to 15 grapes (1 carb serving).  1 cup low-fat milk (1 carb serving). Snack  1 cup carrots.    cup hummus (1 carb serving). Dinner  3 oz broiled salmon.  1 cup brown rice (3 carb servings). Snack  1  cups steamed broccoli (1 carb serving) drizzled with 1 tsp olive oil and lemon juice.  1 cup light pudding (2 carb servings). DIABETES MEAL PLANNING WORKSHEET Your dietician can use this worksheet to help you decide how many servings of foods and what types of foods are right for you.  BREAKFAST Food Group and Servings / Carb Servings Grain/Starches __________________________________ Dairy __________________________________________ Vegetable ______________________________________ Fruit ___________________________________________ Meat __________________________________________ Fat ____________________________________________ LUNCH Food Group and Servings / Carb Servings Grain/Starches ___________________________________ Dairy ___________________________________________ Fruit ____________________________________________ Meat ___________________________________________ Fat _____________________________________________ Laural Golden Food Group and Servings / Carb Servings Grain/Starches ___________________________________ Dairy ___________________________________________ Fruit ____________________________________________ Meat ___________________________________________ Fat _____________________________________________ SNACKS Food Group and Servings / Carb Servings Grain/Starches ___________________________________ Dairy ___________________________________________ Vegetable _______________________________________ Fruit ____________________________________________ Meat ___________________________________________ Fat _____________________________________________ DAILY TOTALS Starches _________________________ Vegetable ________________________ Fruit ____________________________ Dairy ____________________________ Meat ____________________________ Fat  ______________________________ Document Released: 01/02/2005 Document Revised: 06/30/2011 Document Reviewed: 11/13/2008 [facility] Patient Information 2014 [place], [place].

## 2012-11-04 NOTE — Assessment & Plan Note (Signed)
Improving control. HgbA1c today 7.0 (last 07/2012, 7.1). Continues to take Prednisone 10mg  daily for persistent asthma (>59yrs), followed by Pulmonology. Denies any complaints related to hypoglycemia. Seen as walk-in at [facility] for eye exam, would like referral to Ophthalmology for DM eye exam and evaluation of cataracts.  Diabetic Foot Exam performed today (11/04/12) - Normal exam, no peripheral neuropathy or ulcers.  Continue current regimen: Metformin 1000mg  BID, diet (increase vegetables, decrease carbs and red meat), exercise (walking 3-4x weekly). Follow-up for Diabetes in 6 months.
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
## 2012-11-05 ENCOUNTER — Other Ambulatory Visit: Payer: Self-pay | Admitting: *Deleted

## 2012-11-05 ENCOUNTER — Encounter: Payer: Self-pay | Admitting: Family Medicine

## 2012-11-05 MED ORDER — FLUTICASONE PROPIONATE 50 MCG/ACT NA SUSP: 2.0000 | Freq: Every day | NASAL | Status: DC

## 2012-11-23 ENCOUNTER — Ambulatory Visit: Payer: No Typology Code available for payment source

## 2012-12-14 ENCOUNTER — Ambulatory Visit (INDEPENDENT_AMBULATORY_CARE_PROVIDER_SITE_OTHER): Payer: No Typology Code available for payment source | Admitting: Critical Care Medicine

## 2012-12-14 ENCOUNTER — Encounter: Payer: Self-pay | Admitting: Critical Care Medicine

## 2012-12-14 VITALS — BP 122/80 | HR 84 | Temp 98.0°F | Ht 67.0 in | Wt 165.8 lb

## 2012-12-14 DIAGNOSIS — J45909 Unspecified asthma, uncomplicated: Secondary | ICD-10-CM

## 2012-12-14 DIAGNOSIS — J019 Acute sinusitis, unspecified: Secondary | ICD-10-CM

## 2012-12-14 DIAGNOSIS — J45901 Unspecified asthma with (acute) exacerbation: Secondary | ICD-10-CM

## 2012-12-14 MED ORDER — PREDNISONE 10 MG PO TABS: 10.0000 mg | ORAL_TABLET | Freq: Every day | ORAL | Status: DC

## 2012-12-14 MED ORDER — FLUTICASONE-SALMETEROL 250-50 MCG/DOSE IN AEPB: 1.0000 | INHALATION_SPRAY | Freq: Two times a day (BID) | RESPIRATORY_TRACT | Status: DC

## 2012-12-14 MED ORDER — METHYLPREDNISOLONE ACETATE 80 MG/ML IJ SUSP
120.0000 mg | Freq: Once | INTRAMUSCULAR | Status: AC
  Administered 2012-12-14: 120 mg via INTRAMUSCULAR

## 2012-12-14 MED ORDER — AMOXICILLIN-POT CLAVULANATE 875-125 MG PO TABS: 1.0000 | ORAL_TABLET | Freq: Two times a day (BID) | ORAL | Status: DC

## 2012-12-14 MED ORDER — ALBUTEROL SULFATE HFA 108 (90 BASE) MCG/ACT IN AERS: 2.0000 | INHALATION_SPRAY | RESPIRATORY_TRACT | Status: DC | PRN

## 2012-12-14 MED ORDER — BECLOMETHASONE DIPROPIONATE 80 MCG/ACT IN AERS: 2.0000 | INHALATION_SPRAY | Freq: Two times a day (BID) | RESPIRATORY_TRACT | Status: DC

## 2012-12-14 NOTE — Patient Instructions (Addendum)
A depomedrol injection 120mg  was given Augmentin one twice daily for 10days No change in advair and prednisone Refills sent to [facility] dept Return 4 months Flu vaccine this fall
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
## 2012-12-15 NOTE — Assessment & Plan Note (Signed)
Moderate persistent asthma with acute exacerbation due to to mild sinusitis and significant atopic features Plan A depomedrol injection 120mg  was given Augmentin one twice daily for 10days No change in advair and prednisone Refills sent to [facility] de[facility] Return 4 months Flu vaccine this fall

## 2012-12-17 ENCOUNTER — Other Ambulatory Visit: Payer: Self-pay | Admitting: *Deleted

## 2012-12-17 ENCOUNTER — Other Ambulatory Visit: Payer: Self-pay | Admitting: Family Medicine

## 2012-12-17 DIAGNOSIS — E168 Other specified disorders of pancreatic internal secretion: Secondary | ICD-10-CM

## 2012-12-17 MED ORDER — METFORMIN HCL 1000 MG PO TABS: 1000.0000 mg | ORAL_TABLET | Freq: Two times a day (BID) | ORAL | Status: DC

## 2013-01-04 ENCOUNTER — Ambulatory Visit: Payer: No Typology Code available for payment source | Admitting: Family Medicine

## 2013-01-14 ENCOUNTER — Other Ambulatory Visit: Payer: Self-pay | Admitting: *Deleted

## 2013-01-14 MED ORDER — FLUTICASONE PROPIONATE 50 MCG/ACT NA SUSP: 2.0000 | Freq: Every day | NASAL | Status: DC

## 2013-01-14 NOTE — Telephone Encounter (Signed)
Received faxed refill request for flonase from [facility].   PW has signed the faxed refill request ok'in rx. I have [facility] at [phone].

## 2013-03-16 ENCOUNTER — Other Ambulatory Visit: Payer: Self-pay | Admitting: *Deleted

## 2013-03-16 MED ORDER — FLUTICASONE PROPIONATE 50 MCG/ACT NA SUSP: 2.0000 | Freq: Every day | NASAL | Status: DC

## 2013-03-28 ENCOUNTER — Other Ambulatory Visit: Payer: Self-pay | Admitting: *Deleted

## 2013-03-28 DIAGNOSIS — J45909 Unspecified asthma, uncomplicated: Secondary | ICD-10-CM

## 2013-03-28 MED ORDER — ALBUTEROL SULFATE HFA 108 (90 BASE) MCG/ACT IN AERS: 2.0000 | INHALATION_SPRAY | RESPIRATORY_TRACT | Status: DC | PRN

## 2013-04-25 ENCOUNTER — Telehealth: Payer: Self-pay | Admitting: Critical Care Medicine

## 2013-04-25 ENCOUNTER — Ambulatory Visit (INDEPENDENT_AMBULATORY_CARE_PROVIDER_SITE_OTHER): Payer: No Typology Code available for payment source | Admitting: Family Medicine

## 2013-04-25 DIAGNOSIS — E119 Type 2 diabetes mellitus without complications: Secondary | ICD-10-CM

## 2013-04-25 DIAGNOSIS — J45909 Unspecified asthma, uncomplicated: Secondary | ICD-10-CM

## 2013-04-25 MED ORDER — FLUTICASONE-SALMETEROL 250-50 MCG/DOSE IN AEPB: 1.0000 | INHALATION_SPRAY | Freq: Two times a day (BID) | RESPIRATORY_TRACT | Status: DC

## 2013-04-25 MED ORDER — ALBUTEROL SULFATE HFA 108 (90 BASE) MCG/ACT IN AERS: 2.0000 | INHALATION_SPRAY | RESPIRATORY_TRACT | Status: DC | PRN

## 2013-04-25 MED ORDER — PREDNISONE 10 MG PO TABS: ORAL_TABLET | ORAL | Status: DC

## 2013-04-25 NOTE — Telephone Encounter (Signed)
Noted and I agree with this plan

## 2013-04-25 NOTE — Telephone Encounter (Signed)
Went out and spoke with pt and he stated that he has been out of the advair and the albuterol HFA x 2 days.  He is now c/o cough that is non productive and his breathing is worse.  Pt is requesting further recs from [facility].  Pt was last seen in 12/14/12 and was given augmentin and prednisone at that time.  Samples of the advair were given but no albuteral samples aval.  Pt is in the lobby.  PW please advise. Thanks  No Known Allergies   Current Outpatient Prescriptions on File Prior to Visit  Medication Sig Dispense Refill  . albuterol (PROVENTIL HFA;VENTOLIN HFA) 108 (90 BASE) MCG/ACT inhaler Inhale 2 puffs into the lungs every 4 (four) hours as needed for wheezing (can take 4 puffs if you feel like you need nebulizer).  3 Inhaler  1  . amoxicillin-clavulanate (AUGMENTIN) 875-125 MG per tablet Take 1 tablet by mouth 2 (two) times daily.  20 tablet  0  . aspirin (ADULT ASPIRIN EC LOW STRENGTH) 81 MG EC tablet Take 81 mg by mouth daily.        Marland Kitchen atorvastatin (LIPITOR) 40 MG tablet Take 1 tablet (40 mg total) by mouth daily.  90 tablet  1  . beclomethasone (QVAR) 80 MCG/ACT inhaler Inhale 2 puffs into the lungs 2 (two) times daily.  1 Inhaler  0  . Calcium Carbonate-Vit D-Min 600-400 MG-UNIT TABS Take 1 tablet by mouth 2 (two) times daily.       . carvedilol (COREG) 6.25 MG tablet Take 1 tablet (6.25 mg total) by mouth 2 (two) times daily.  180 tablet  3  . fluticasone (FLONASE) 50 MCG/ACT nasal spray Place 2 sprays into both nostrils daily.  16 g  6  . Fluticasone-Salmeterol (ADVAIR) 250-50 MCG/DOSE AEPB Inhale 1 puff into the lungs every 12 (twelve) hours.  60 each  11  . lisinopril (PRINIVIL,ZESTRIL) 2.5 MG tablet Take 1 tablet (2.5 mg total) by mouth daily.  30 tablet  3  . metFORMIN (GLUCOPHAGE) 1000 MG tablet Take 1 tablet (1,000 mg total) by mouth 2 (two) times daily with a meal.  60 tablet  3  . predniSONE (DELTASONE) 10 MG tablet Take 1 tablet (10 mg total) by mouth daily.  30 tablet  11   No  current facility-administered medications on file prior to visit.

## 2013-04-25 NOTE — Telephone Encounter (Signed)
Per PW:  Send in prednisone 10 mg take 40mg  x 3 days, 30mg  x 3 days, 20 mg x 3 days, then back to one daily # 30.  Thank you.

## 2013-04-25 NOTE — Telephone Encounter (Signed)
Went out and spoke with pt and he is aware of pred taper per PW and he requested that this be sent in to [facility] on [address] along with the albuterol inhaler.  These have been sent to the pharmacy and pt is aware.  Sample of the advair has been given to the pt.

## 2013-04-27 ENCOUNTER — Encounter: Payer: Self-pay | Admitting: Cardiovascular Disease

## 2013-04-27 ENCOUNTER — Ambulatory Visit (INDEPENDENT_AMBULATORY_CARE_PROVIDER_SITE_OTHER): Payer: No Typology Code available for payment source | Admitting: Cardiovascular Disease

## 2013-04-27 ENCOUNTER — Ambulatory Visit (INDEPENDENT_AMBULATORY_CARE_PROVIDER_SITE_OTHER): Payer: No Typology Code available for payment source | Admitting: Critical Care Medicine

## 2013-04-27 ENCOUNTER — Encounter: Payer: Self-pay | Admitting: Critical Care Medicine

## 2013-04-27 VITALS — BP 149/100 | HR 77 | Ht 68.0 in | Wt 166.0 lb

## 2013-04-27 VITALS — BP 140/88 | HR 87 | Ht 68.0 in | Wt 168.2 lb

## 2013-04-27 DIAGNOSIS — I2510 Atherosclerotic heart disease of native coronary artery without angina pectoris: Secondary | ICD-10-CM

## 2013-04-27 DIAGNOSIS — I255 Ischemic cardiomyopathy: Secondary | ICD-10-CM

## 2013-04-27 DIAGNOSIS — I2589 Other forms of chronic ischemic heart disease: Secondary | ICD-10-CM

## 2013-04-27 DIAGNOSIS — J45909 Unspecified asthma, uncomplicated: Secondary | ICD-10-CM

## 2013-04-27 MED ORDER — ALBUTEROL SULFATE (2.5 MG/3ML) 0.083% IN NEBU: 2.5000 mg | INHALATION_SOLUTION | Freq: Four times a day (QID) | RESPIRATORY_TRACT | Status: DC | PRN

## 2013-04-27 MED ORDER — PREDNISONE 10 MG PO TABS: ORAL_TABLET | ORAL | Status: DC

## 2013-04-27 MED ORDER — FLUTICASONE-SALMETEROL 250-50 MCG/DOSE IN AEPB: 1.0000 | INHALATION_SPRAY | Freq: Two times a day (BID) | RESPIRATORY_TRACT | Status: DC

## 2013-04-27 MED ORDER — BECLOMETHASONE DIPROPIONATE 80 MCG/ACT IN AERS: 2.0000 | INHALATION_SPRAY | Freq: Two times a day (BID) | RESPIRATORY_TRACT | Status: DC

## 2013-04-27 MED ORDER — CLOPIDOGREL BISULFATE 75 MG PO TABS: 75.0000 mg | ORAL_TABLET | Freq: Every day | ORAL | Status: DC

## 2013-04-27 NOTE — Patient Instructions (Signed)
Your physician wants you to follow-up in:  12 months.  You will receive a reminder letter in the mail two months in advance. If you don't receive a letter, please call our office to schedule the follow-up appointment.  Your physician has recommended you make the following change in your medication: Start Clopidogrel 75 mg by mouth daily.    

## 2013-04-27 NOTE — Progress Notes (Signed)
History of Present Illness: 53 yo male with history of CAD, asthma, hyperlipidemia here today for cardiac f/u. He has been followed in the past by Dr. Olevia Perches.  In 2008 he had an anterior MI treated with a DES to the LAD. His ejection fraction was 45% at that time. Myoview 2010 showed no ischemia and showed an ejection fraction of 40%.   He is here today for follow up. He has had no chest pain, SOB or palpitations. He has been tolerating all medications without difficulty. Blood pressure has been well controlled at home. Recent asthma flare.   His primary care is the [facility].   Last Lipid Profile:  Lipid Panel     Component Value Date/Time   CHOL 104 06/24/2011 1109   TRIG 61 06/24/2011 1109   HDL 52 06/24/2011 1109   CHOLHDL 2.0 06/24/2011 1109   VLDL 12 06/24/2011 1109   LDLCALC 40 06/24/2011 1109     Past Medical History  Diagnosis Date  . Asthma   . Dental caries   . CAD (coronary artery disease)   . Diabetes mellitus   . GERD (gastroesophageal reflux disease)   . Hyperlipidemia   . Asthma   . Thyroid nodule     biopsy negative 2006    Past Surgical History  Procedure Laterality Date  . Coronary stent placement    . Solitary pulm and thryoid nodules      Current Outpatient Prescriptions  Medication Sig Dispense Refill  . albuterol (PROVENTIL HFA;VENTOLIN HFA) 108 (90 BASE) MCG/ACT inhaler Inhale 2 puffs into the lungs every 4 (four) hours as needed for wheezing (can take 4 puffs if you feel like you need nebulizer).  1 Inhaler  1  . albuterol (PROVENTIL) (2.5 MG/3ML) 0.083% nebulizer solution Take 3 mLs (2.5 mg total) by nebulization every 6 (six) hours as needed for wheezing or shortness of breath.  75 mL  12  . aspirin (ADULT ASPIRIN EC LOW STRENGTH) 81 MG EC tablet Take 81 mg by mouth daily.        Marland Kitchen atorvastatin (LIPITOR) 40 MG tablet Take 1 tablet (40 mg total) by mouth daily.  90 tablet  1  . beclomethasone (QVAR) 80 MCG/ACT inhaler Inhale 2  puffs into the lungs 2 (two) times daily.  1 Inhaler  0  . Calcium Carbonate-Vit D-Min 600-400 MG-UNIT TABS Take 1 tablet by mouth 2 (two) times daily.       . carvedilol (COREG) 6.25 MG tablet Take 1 tablet (6.25 mg total) by mouth 2 (two) times daily.  180 tablet  3  . fluticasone (FLONASE) 50 MCG/ACT nasal spray Place 2 sprays into both nostrils daily.  16 g  6  . Fluticasone-Salmeterol (ADVAIR) 250-50 MCG/DOSE AEPB Inhale 1 puff into the lungs every 12 (twelve) hours.  60 each  11  . lisinopril (PRINIVIL,ZESTRIL) 2.5 MG tablet Take 1 tablet (2.5 mg total) by mouth daily.  30 tablet  3  . metFORMIN (GLUCOPHAGE) 1000 MG tablet Take 1 tablet (1,000 mg total) by mouth 2 (two) times daily with a meal.  60 tablet  3  . predniSONE (DELTASONE) 10 MG tablet One daily  30 tablet  11   No current facility-administered medications for this visit.    No Known Allergies  History   Social History  . Marital Status: Married    Spouse Name: N/A    Number of Children: N/A  . Years of Education: N/A   Occupational History  .  Not on file.   Social History Main Topics  . Smoking status: Former Smoker -- 0.50 packs/day for 22 years    Types: Cigarettes    Quit date: 04/21/2006  . Smokeless tobacco: Never Used  . Alcohol Use: No  . Drug Use: No  . Sexual Activity: Yes    Birth Control/ Protection: Condom   Other Topics Concern  . Not on file   Social History Narrative   Lives with wife; recently moved to [place] from [place] due to health problems. Has no means of affording meds currently + tobacco 1/2ppd x 64yrs, no ETOH no drugs.    Family History  Problem Relation Age of Onset  . Diabetes Mother   . Heart attack Brother 36    Review of Systems:  As stated in the HPI and otherwise negative.   BP 149/100  Pulse 77  Ht 5\' 8"  (1.727 m)  Wt 166 lb (75.297 kg)  BMI 25.25 kg/m2  Physical Examination: General: Well developed, well nourished, NAD HEENT: OP clear, mucus membranes  moist SKIN: warm, dry. No rashes. Neuro: No focal deficits Musculoskeletal: Muscle strength 5/5 all ext Psychiatric: Mood and affect normal Neck: No JVD, no carotid bruits, no thyromegaly, no lymphadenopathy. Lungs:Clear bilaterally, no wheezes, rhonci, crackles Cardiovascular: Regular rate and rhythm. No murmurs, gallops or rubs. Abdomen:Soft. Bowel sounds present. Non-tender.  Extremities: No lower extremity edema. Pulses are 2 + in the bilateral DP/PT.  EKG: NSR, rate 77 bpm. Non-specific ST and T changes.   Assessment and Plan:   1. CAD: Stable. Will continue ASA. Will restart Plavix based on recent findings in DAPT trial. Will continue Coreg and Lisinopril. Lipids well controlled on statin. BP is well controlled at home. He notes frequent coughing, asthma flare and use of prednisone today.     2. Cardiomyopathy, ischemic:  He is on good medical therapy. Continue ASA, beta blocker and Ace-inh. Volume status is ok.
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
## 2013-04-27 NOTE — Patient Instructions (Signed)
No change in medications. Stay on prednisone 10mg  daily Refills sent Return 4 months

## 2013-04-28 NOTE — Assessment & Plan Note (Signed)
Recent exacerbation now improved Plan  Maintain pred 10mg /d No other changes

## 2013-05-03 ENCOUNTER — Ambulatory Visit: Payer: No Typology Code available for payment source

## 2013-05-03 NOTE — Progress Notes (Signed)
Patient ID: [name], male   DOB: 12/08/1960, 52 y.o.   MRN: [id number] Met with financial counsellor.  

## 2013-06-28 ENCOUNTER — Other Ambulatory Visit: Payer: Self-pay | Admitting: *Deleted

## 2013-06-28 DIAGNOSIS — E168 Other specified disorders of pancreatic internal secretion: Secondary | ICD-10-CM

## 2013-06-28 MED ORDER — METFORMIN HCL 1000 MG PO TABS: 1000.0000 mg | ORAL_TABLET | Freq: Two times a day (BID) | ORAL | Status: DC

## 2013-06-28 NOTE — Telephone Encounter (Signed)
[facility] Dept to phone in prescription for Metformin 1000mg  take 1 tab BID with meals (#60, 5 refills).  [name], [place], PGY-1

## 2013-09-02 ENCOUNTER — Encounter (INDEPENDENT_AMBULATORY_CARE_PROVIDER_SITE_OTHER): Payer: Self-pay

## 2013-09-02 ENCOUNTER — Encounter: Payer: Self-pay | Admitting: Internal Medicine

## 2013-09-02 ENCOUNTER — Ambulatory Visit (INDEPENDENT_AMBULATORY_CARE_PROVIDER_SITE_OTHER): Payer: No Typology Code available for payment source | Admitting: Internal Medicine

## 2013-09-02 ENCOUNTER — Ambulatory Visit: Payer: No Typology Code available for payment source | Admitting: Adult Health

## 2013-09-02 VITALS — BP 132/80 | HR 78 | Temp 97.9°F | Ht 68.0 in | Wt 164.6 lb

## 2013-09-02 DIAGNOSIS — I2589 Other forms of chronic ischemic heart disease: Secondary | ICD-10-CM

## 2013-09-02 DIAGNOSIS — I255 Ischemic cardiomyopathy: Secondary | ICD-10-CM

## 2013-09-02 DIAGNOSIS — J45909 Unspecified asthma, uncomplicated: Secondary | ICD-10-CM

## 2013-09-02 MED ORDER — ALBUTEROL SULFATE HFA 108 (90 BASE) MCG/ACT IN AERS: INHALATION_SPRAY | RESPIRATORY_TRACT | Status: DC

## 2013-09-02 MED ORDER — TELMISARTAN 40 MG PO TABS: ORAL_TABLET | ORAL | Status: DC

## 2013-09-02 MED ORDER — AMOXICILLIN-POT CLAVULANATE 875-125 MG PO TABS: 1.0000 | ORAL_TABLET | Freq: Two times a day (BID) | ORAL | Status: DC

## 2013-09-02 MED ORDER — BISOPROLOL FUMARATE 5 MG PO TABS: ORAL_TABLET | ORAL | Status: DC
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
## 2013-09-02 NOTE — Patient Instructions (Addendum)
Stop advair and qvar and start dulera 200 Take 2 puffs first thing in am and then another 2 puffs about 12 hours later.   Stop lisinopril and start micardis 40 mg one half daily   Prednisone 10 mg 2 until better then one daily until seen   Augmentin 875 mg take one pill twice daily  X 10 days - take at breakfast and supper with large glass of water.  It would help reduce the usual side effects (diarrhea and yeast infections) if you ate cultured yogurt at lunch.   Stop coreg and start bisoprol 5  One half daily    Plan B = backup Only use your albuterol (proair)  as a rescue medication to be used if you can't catch your breath by resting or doing a relaxed purse lip breathing pattern.  - The less you use it, the better it will work when you need it. - Ok to use up to 2 puffs  every 4 hours if you must but call for immediate appointment if use goes up over your usual need - Don't leave home without it !!  (think of it like the spare tire for your car)   Plan C = backup to plan B = albuterol nebulizer up to every 4 hours as needed  See Tammy NP w/in 1 weeks with all your medications, even over the counter meds, separated in two separate bags, the ones you take no matter what vs the ones you stop once you feel better and take only as needed when you feel you need them.   Tammy  will generate for you a new user friendly medication calendar that will put [place] all on the same page re: your medication use.     Without this process, it simply isn't possible to assure that we are providing  your outpatient care  with  the attention to detail we feel you deserve.   If we cannot assure that you're getting that kind of care,  then we cannot manage your problem effectively from this clinic.  Once you have seen Tammy and we are sure that we're all on the same page with your medication use she will arrange follow up with me.  Add: recheck bp/bnp/ hold off GERD rx for now

## 2013-09-03 NOTE — Assessment & Plan Note (Signed)
Strongly prefer in this setting: Bystolic, the most beta -1  selective Beta blocker available in sample form, with bisoprolol the most selective generic choice  on the market.   Also ACE inhibitors are problematic in  pts with airway complaints because  even experienced pulmonologists can't always distinguish ace effects from copd/asthma.  By themselves they don't actually cause a problem, much like oxygen can't by itself start a fire, but they certainly serve as a powerful catalyst or enhancer for any "fire"  or inflammatory process in the upper airway, be it caused by an ET  tube or more commonly reflux (especially in the obese or pts with known GERD or who are on biphoshonates).    In the era of ARB near equivalency until we have a better handle on the reversibility of the airway problem, it just makes sense to avoid ACEI  entirely in the short run and then decide later, having established a level of airway control using a reasonable limited regimen, whether to add back ace but even then being very careful to observe the pt for worsening airway control and number of meds used/ needed to control symptoms.    rx bisoprolol 2.5 mg daily and micardis 40 mg one half daily on trial basis then recheck bp/bnp

## 2013-09-03 NOTE — Assessment & Plan Note (Addendum)
-   PFT's 2007  FEV1  1.78 (50%) ratio 52 and 19% resp to saba, dlco 75%  On his best days he remains steroid dep at 10 mg daily and has failed xolair so by definition his dz is difficult to control at baseline and now flaring in setting of purulent rhinitis/tracheobronchitis.  DDX of  difficult airways managment all start with A and  include Adherence, Ace Inhibitors, Acid Reflux, Active Sinus Disease, Alpha 1 Antitripsin deficiency, Anxiety masquerading as Airways dz,  ABPA,  allergy(esp in young), Aspiration (esp in elderly), Adverse effects of DPI,  Active smokers, plus two Bs  = Bronchiectasis and Beta blocker use..and one C= CHF   Adherence is always the initial "prime suspect" and is a multilayered concern that requires a "trust but verify" approach in every patient - starting with knowing how to use medications, especially inhalers, correctly, keeping up with refills and understanding the fundamental difference between maintenance and prns vs those medications only taken for a very short course and then stopped and not refilled.  The proper method of use, as well as anticipated side effects, of a metered-dose inhaler are discussed and demonstrated to the patient. Improved effectiveness after extensive coaching during this visit to a level of approximately  75% from a basline of < 25%  ? Adverse effect of dpi > try d/c advair and trial of dulera 200 2bid   ? acei effects > see hbp  ? BB effects > see hbp  ? Allergy > increase pred to 20 until better then back to 10 mg daily for now but should not need more than physiologic doses if all other rx is effective  ? Acid (or non-acid) GERD > always difficult to exclude as up to 75% of pts in some series report no assoc GI/ Heartburn symptoms> rec hold this until returns depending on response to rx   ? Active sinus dz > Augmentin 875 mg take one pill twice daily  X 10 days -    See instructions for specific recommendations which were reviewed  directly with the patient who was given a copy with highlighter outlining the key components.

## 2013-09-09 ENCOUNTER — Ambulatory Visit (INDEPENDENT_AMBULATORY_CARE_PROVIDER_SITE_OTHER): Payer: No Typology Code available for payment source | Admitting: Adult Health

## 2013-09-09 ENCOUNTER — Encounter: Payer: Self-pay | Admitting: Adult Health

## 2013-09-09 VITALS — BP 106/66 | HR 59 | Temp 98.2°F | Ht 68.0 in | Wt 165.4 lb

## 2013-09-09 DIAGNOSIS — J45909 Unspecified asthma, uncomplicated: Secondary | ICD-10-CM

## 2013-09-09 MED ORDER — TELMISARTAN 40 MG PO TABS: 20.0000 mg | ORAL_TABLET | Freq: Every day | ORAL | Status: DC

## 2013-09-09 MED ORDER — MOMETASONE FURO-FORMOTEROL FUM 200-5 MCG/ACT IN AERO: 2.0000 | INHALATION_SPRAY | Freq: Two times a day (BID) | RESPIRATORY_TRACT | Status: DC

## 2013-09-09 NOTE — Patient Instructions (Signed)
Continue on Dulera 2 puffs Twice daily  , rinse after use.  Decrease Prednisone 10mg  daily  Follow med calendar closely and bring to each visit.  Finish Augmentin  Follow up with your primary doctor for your  Blood pressure-it looks very good on your new meds.  Please contact office for sooner follow up if symptoms do not improve or worsen or seek emergency care  Follow up Dr. Joya Gaskins  In 4 weeks and As needed
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
## 2013-09-09 NOTE — Assessment & Plan Note (Signed)
Recent flare now much improved  Would avoid non selective beta blockers and ACE inhibitors in future if possible.   Plan  Continue on Dulera 2 puffs Twice daily  , rinse after use.  Decrease Prednisone 10mg  daily  Follow med calendar closely and bring to each visit.  Finish Augmentin  Follow up with your primary doctor for your  Blood pressure-it looks very good on your new meds.  Please contact office for sooner follow up if symptoms do not improve or worsen or seek emergency care  Follow up Dr. Joya Gaskins  In 4 weeks and As needed

## 2013-09-13 NOTE — Addendum Note (Signed)
Addended by: Parke Poisson E on: 09/13/2013 09:24 AM   Modules accepted: Orders

## 2013-10-07 ENCOUNTER — Ambulatory Visit: Payer: No Typology Code available for payment source | Admitting: Critical Care Medicine

## 2013-11-03 ENCOUNTER — Ambulatory Visit (INDEPENDENT_AMBULATORY_CARE_PROVIDER_SITE_OTHER): Payer: Self-pay | Admitting: Critical Care Medicine

## 2013-11-03 ENCOUNTER — Encounter: Payer: Self-pay | Admitting: Critical Care Medicine

## 2013-11-03 VITALS — BP 132/86 | HR 74 | Temp 96.8°F | Ht 67.0 in | Wt 167.0 lb

## 2013-11-03 DIAGNOSIS — J45909 Unspecified asthma, uncomplicated: Secondary | ICD-10-CM

## 2013-11-03 MED ORDER — MONTELUKAST SODIUM 10 MG PO TABS: 10.0000 mg | ORAL_TABLET | Freq: Every day | ORAL | Status: DC

## 2013-11-03 MED ORDER — PREDNISONE 10 MG PO TABS: ORAL_TABLET | ORAL | Status: DC

## 2013-11-03 MED ORDER — LEVOFLOXACIN 500 MG PO TABS: 500.0000 mg | ORAL_TABLET | Freq: Every day | ORAL | Status: DC

## 2013-11-03 NOTE — Assessment & Plan Note (Signed)
Moderate persistent asthma steroid dependent Acute flare due to acute sinusitis Plan Take prednisone 10mg  Take 4 for three days 3 for three days 2 for three days then one daily Montelukast is singulair generic one daily Levaquin one daily for 7days No other changes Return 2 months
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
## 2013-11-03 NOTE — Progress Notes (Signed)
Subjective:    Patient ID: [name], male    DOB: 17-May-1960, 53 y.o.   MRN: [id number]  HPI 11/03/2013 Chief Complaint  Patient presents with  . Follow-up    Breathing doing well overall.  C/o yellow drainage from right ear x 5-6 days.  Slight nonprod cough.  No ear pain/pressure, SOB, wheezing, or chest tightness/pain.  Pt stopped dulera and went to advair d/t cough when using dulera.  Uses albuterol 2x everyother day The patient now notes increased nasal drainage increased wheezing and cough. The patient's no longer on Singulair. The patient was switched to [facility] to last office visit but is not tolerating this well and so selected back to Advair. Pt denies any significant sore throat, nasal congestion or excess secretions, fever, chills, sweats, unintended weight loss, pleurtic or exertional chest pain, orthopnea PND, or leg swelling Pt denies any increase in rescue therapy over baseline, denies waking up needing it or having any early am or nocturnal exacerbations of coughing/wheezing/or dyspnea. Pt also denies any obvious fluctuation in symptoms with  weather or environmental change or other alleviating or aggravating factors     Review of Systems Negative except as noted above    Objective:   Physical Exam Filed Vitals:   11/03/13 1616  BP: 132/86  Pulse: 74  Temp: 96.8 F (36 C)  TempSrc: Oral  Height: 5\' 7"  (1.702 m)  Weight: 167 lb (75.751 kg)  SpO2: 98%    Gen: Pleasant, well-nourished, in no distress,  normal affect  ENT: No lesions,  mouth clear,  oropharynx clear, moderate postnasal drip, bilateral nasal purulence, right ear shows drainage from tympanic membrane  Neck: No JVD, no TMG, no carotid bruits  Lungs: No use of accessory muscles, no dullness to percussion, it with expiratory wheeze Cardiovascular: RRR, heart sounds normal, no murmur or gallops, no peripheral edema  Abdomen: soft and NT, no HSM,  BS normal  Musculoskeletal: No deformities,  no cyanosis or clubbing  Neuro: alert, non focal  Skin: Warm, no lesions or rashes  No results found.        Assessment & Plan:   ASTHMA, PERSISTENT Moderate persistent asthma steroid dependent Acute flare due to acute sinusitis Plan Take prednisone 10mg  Take 4 for three days 3 for three days 2 for three days then one daily Montelukast is singulair generic one daily Levaquin one daily for 7days No other changes Return 2 months    Updated Medication List Outpatient Encounter Prescriptions as of 11/03/2013  Medication Sig  . albuterol (PROVENTIL HFA;VENTOLIN HFA) 108 (90 BASE) MCG/ACT inhaler Inhale 2 puffs into the lungs every 4 (four) hours as needed for wheezing (can take 4 puffs if you feel like you need nebulizer).  Marland Kitchen albuterol (PROVENTIL) (2.5 MG/3ML) 0.083% nebulizer solution Take 2.5 mg by nebulization every 4 (four) hours as needed for wheezing or shortness of breath.  Marland Kitchen aspirin (ADULT ASPIRIN EC LOW STRENGTH) 81 MG EC tablet Take 81 mg by mouth daily.    Marland Kitchen atorvastatin (LIPITOR) 40 MG tablet Take 40 mg by mouth at bedtime.  . bisoprolol (ZEBETA) 5 MG tablet One half daily  . Calcium Carbonate-Vit D-Min 600-400 MG-UNIT TABS Take 1 tablet by mouth 2 (two) times daily.   . clopidogrel (PLAVIX) 75 MG tablet Take 1 tablet (75 mg total) by mouth daily.  . fluticasone (FLONASE) 50 MCG/ACT nasal spray Place 2 sprays into both nostrils daily as needed.  . Fluticasone-Salmeterol (ADVAIR) 250-50 MCG/DOSE AEPB Inhale 1 puff into  the lungs 2 (two) times daily.  . metFORMIN (GLUCOPHAGE) 1000 MG tablet Take 1 tablet (1,000 mg total) by mouth 2 (two) times daily with a meal.  . predniSONE (DELTASONE) 10 MG tablet Take 4 for three days 3 for three days 2 for three days then one daily  . telmisartan (MICARDIS) 40 MG tablet Take 0.5 tablets (20 mg total) by mouth daily. One half daily  . [DISCONTINUED] predniSONE (DELTASONE) 10 MG tablet One daily  . levofloxacin (LEVAQUIN) 500 MG  tablet Take 1 tablet (500 mg total) by mouth daily.  . montelukast (SINGULAIR) 10 MG tablet Take 1 tablet (10 mg total) by mouth at bedtime.  . [DISCONTINUED] amoxicillin-clavulanate (AUGMENTIN) 875-125 MG per tablet Take 1 tablet by mouth 2 (two) times daily.  . [DISCONTINUED] mometasone-formoterol (DULERA) 200-5 MCG/ACT AERO Inhale 2 puffs into the lungs 2 (two) times daily.

## 2013-11-03 NOTE — Patient Instructions (Signed)
Take prednisone 10mg  Take 4 for three days 3 for three days 2 for three days then one daily Montelukast is singulair generic one daily Levaquin one daily for 7days No other changes Return 2 months

## 2013-11-10 ENCOUNTER — Other Ambulatory Visit: Payer: Self-pay | Admitting: *Deleted

## 2013-11-11 MED ORDER — ATORVASTATIN CALCIUM 40 MG PO TABS: 40.0000 mg | ORAL_TABLET | Freq: Every day | ORAL | Status: DC

## 2013-11-17 ENCOUNTER — Encounter: Payer: Self-pay | Admitting: Internal Medicine

## 2013-12-23 ENCOUNTER — Other Ambulatory Visit: Payer: Self-pay | Admitting: Emergency Medicine

## 2013-12-23 MED ORDER — MOMETASONE FUROATE 50 MCG/ACT NA SUSP: 2.0000 | Freq: Every day | NASAL | Status: DC

## 2014-01-09 ENCOUNTER — Ambulatory Visit: Payer: Self-pay | Admitting: Critical Care Medicine

## 2014-02-03 ENCOUNTER — Other Ambulatory Visit: Payer: Self-pay | Admitting: *Deleted

## 2014-02-03 MED ORDER — FLUTICASONE-SALMETEROL 250-50 MCG/DOSE IN AEPB
1.0000 | INHALATION_SPRAY | Freq: Two times a day (BID) | RESPIRATORY_TRACT | Status: DC
Start: 2014-02-03 — End: 2014-09-13

## 2014-02-03 MED ORDER — ALBUTEROL SULFATE HFA 108 (90 BASE) MCG/ACT IN AERS: 2.0000 | INHALATION_SPRAY | RESPIRATORY_TRACT | Status: DC | PRN

## 2014-02-10 ENCOUNTER — Telehealth: Payer: Self-pay | Admitting: Critical Care Medicine

## 2014-02-10 NOTE — Telephone Encounter (Signed)
lmtcb for [name]. 

## 2014-02-13 NOTE — Telephone Encounter (Signed)
lmomtcb x1 

## 2014-02-14 NOTE — Telephone Encounter (Signed)
Called Teresa at number listed-was put through to a voicemail for the group-requested the pharmacy call us back regarding the patients medication.

## 2014-02-15 NOTE — Telephone Encounter (Signed)
ATC # provided.  Office does not open until 8:30 am.  Will need to call back later.

## 2014-02-15 NOTE — Telephone Encounter (Signed)
LMTCB at pharmacy voicemail requesting they call back today about medication for patient. Message on VM line states to allow up to 3 business days for a return call.

## 2014-02-16 MED ORDER — MOMETASONE FUROATE 50 MCG/ACT NA SUSP: NASAL | Status: DC

## 2014-02-16 NOTE — Telephone Encounter (Signed)
Spoke with Clarene Critchley with [facility]. Nasonex rx was called in as 2 sprays into the nose qd -- needing more detailed directions (1 puff into each nostril, 2 puffs, etc). Gave new VO for Nasonex 2 puffs in each nostril qd # 1 bottle x 6.  Clarene Critchley verbalized understanding and voiced no further questions or concerns at this time.

## 2014-02-16 NOTE — Telephone Encounter (Signed)
Clarene Critchley w/ [facility] returned call & can be reached at [phone].  Satira Anis'

## 2014-02-20 ENCOUNTER — Other Ambulatory Visit: Payer: Self-pay | Admitting: *Deleted

## 2014-02-21 MED ORDER — PREDNISONE 10 MG PO TABS: ORAL_TABLET | ORAL | Status: DC

## 2014-05-02 ENCOUNTER — Encounter: Payer: Self-pay | Admitting: *Deleted

## 2014-05-03 ENCOUNTER — Encounter: Payer: Self-pay | Admitting: Cardiovascular Disease

## 2014-05-03 ENCOUNTER — Ambulatory Visit (INDEPENDENT_AMBULATORY_CARE_PROVIDER_SITE_OTHER): Payer: No Typology Code available for payment source | Admitting: Cardiovascular Disease

## 2014-05-03 VITALS — BP 118/82 | HR 100 | Ht 67.0 in | Wt 163.8 lb

## 2014-05-03 DIAGNOSIS — I10 Essential (primary) hypertension: Secondary | ICD-10-CM

## 2014-05-03 DIAGNOSIS — I255 Ischemic cardiomyopathy: Secondary | ICD-10-CM

## 2014-05-03 DIAGNOSIS — I2510 Atherosclerotic heart disease of native coronary artery without angina pectoris: Secondary | ICD-10-CM

## 2014-05-03 MED ORDER — ATORVASTATIN CALCIUM 40 MG PO TABS: 40.0000 mg | ORAL_TABLET | Freq: Every day | ORAL | Status: DC

## 2014-05-03 MED ORDER — CLOPIDOGREL BISULFATE 75 MG PO TABS: 75.0000 mg | ORAL_TABLET | Freq: Every day | ORAL | Status: DC

## 2014-05-03 NOTE — Patient Instructions (Signed)

## 2014-05-03 NOTE — Progress Notes (Signed)
History of Present Illness: 54 yo male with history of CAD, asthma, hyperlipidemia here today for cardiac f/u. He has been followed in the past by Dr. Olevia Perches.  In 2008 he had an anterior MI treated with a DES to the LAD. His ejection fraction was 45% at that time. Myoview 2010 showed no ischemia and showed an ejection fraction of 40%.   He is here today for follow up. He has had no chest pain, SOB or palpitations. No LE edema. He has been tolerating all medications without difficulty. He does note some trouble swallowing with choking sensation.   His primary care is [facility].   Last Lipid Profile:  Lipid Panel     Component Value Date/Time   CHOL 104 06/24/2011 1109   TRIG 61 06/24/2011 1109   HDL 52 06/24/2011 1109   CHOLHDL 2.0 06/24/2011 1109   VLDL 12 06/24/2011 1109   LDLCALC 40 06/24/2011 1109     Past Medical History  Diagnosis Date  . Asthma   . Dental caries   . CAD (coronary artery disease)   . Diabetes mellitus   . GERD (gastroesophageal reflux disease)   . Hyperlipidemia   . Asthma   . Thyroid nodule     biopsy negative 2006    Past Surgical History  Procedure Laterality Date  . Coronary stent placement    . Solitary pulm and thryoid nodules      Current Outpatient Prescriptions  Medication Sig Dispense Refill  . albuterol (PROVENTIL HFA;VENTOLIN HFA) 108 (90 BASE) MCG/ACT inhaler Inhale 2 puffs into the lungs every 4 (four) hours as needed for wheezing or shortness of breath. 1 Inhaler 3  . albuterol (PROVENTIL) (2.5 MG/3ML) 0.083% nebulizer solution Take 2.5 mg by nebulization every 4 (four) hours as needed for wheezing or shortness of breath.    Marland Kitchen aspirin (ADULT ASPIRIN EC LOW STRENGTH) 81 MG EC tablet Take 81 mg by mouth daily.      Marland Kitchen atorvastatin (LIPITOR) 40 MG tablet Take 1 tablet (40 mg total) by mouth at bedtime. 30 tablet 5  . bisoprolol (ZEBETA) 5 MG tablet One half daily 15 tablet 11  . Calcium Carbonate-Vit D-Min  600-400 MG-UNIT TABS Take 1 tablet by mouth 2 (two) times daily.     . clopidogrel (PLAVIX) 75 MG tablet Take 1 tablet (75 mg total) by mouth daily. 30 tablet 11  . Fluticasone-Salmeterol (ADVAIR) 250-50 MCG/DOSE AEPB Inhale 1 puff into the lungs 2 (two) times daily. 60 each 6  . metFORMIN (GLUCOPHAGE) 1000 MG tablet Take 1 tablet (1,000 mg total) by mouth 2 (two) times daily with a meal. 60 tablet 5  . mometasone (NASONEX) 50 MCG/ACT nasal spray 2 sprays in each nostril once daily 17 g 6  . montelukast (SINGULAIR) 10 MG tablet Take 1 tablet (10 mg total) by mouth at bedtime. 30 tablet 11  . predniSONE (DELTASONE) 10 MG tablet Take 4 for three days 3 for three days 2 for three days then one daily 30 tablet 11  . albuterol (PROVENTIL HFA;VENTOLIN HFA) 108 (90 BASE) MCG/ACT inhaler Inhale 2 puffs into the lungs every 4 (four) hours as needed for wheezing (can take 4 puffs if you feel like you need nebulizer). 1 Inhaler 1   No current facility-administered medications for this visit.    No Known Allergies  History   Social History  . Marital Status: Married    Spouse Name: N/A    Number of Children:  N/A  . Years of Education: N/A   Occupational History  . Not on file.   Social History Main Topics  . Smoking status: Former Smoker -- 0.50 packs/day for 22 years    Types: Cigarettes    Quit date: 04/21/2006  . Smokeless tobacco: Never Used  . Alcohol Use: No  . Drug Use: No  . Sexual Activity: Yes    Birth Control/ Protection: Condom   Other Topics Concern  . Not on file   Social History Narrative   Lives with wife; recently moved to [place] from [place] due to health problems. Has no means of affording meds currently + tobacco 1/2ppd x 38yrs, no ETOH no drugs.    Family History  Problem Relation Age of Onset  . Diabetes Mother   . Heart attack Brother 36    Review of Systems:  As stated in the HPI and otherwise negative.   BP 118/82 mmHg  Pulse 100  Ht 5\' 7"  (1.702 m)  Wt 163  lb 12.8 oz (74.299 kg)  BMI 25.65 kg/m2  SpO2 96%  Physical Examination: General: Well developed, well nourished, NAD HEENT: OP clear, mucus membranes moist SKIN: warm, dry. No rashes. Neuro: No focal deficits Musculoskeletal: Muscle strength 5/5 all ext Psychiatric: Mood and affect normal Neck: No JVD, no carotid bruits, no thyromegaly, no lymphadenopathy. Lungs:Clear bilaterally, no wheezes, rhonci, crackles Cardiovascular: Regular rate and rhythm. No murmurs, gallops or rubs. Abdomen:Soft. Bowel sounds present. Non-tender.  Extremities: No lower extremity edema. Pulses are 2 + in the bilateral DP/PT.  EKG: NSR, rate 100 bpm. ST segment elevation diffusely, unchanged.   Assessment and Plan:   1. CAD: Stable. Will continue ASA and Plavix. His Coreg was changed to Bisoprolol. He is on a statin. Lipids followed in primary care. He needs this updated now.     2. Cardiomyopathy, ischemic:  He is on a beta blocker but his Ace-inh was stopped. Continue ASA, beta blocker. Will try to find why his Ace-inh was stopped. Volume status is ok.     3. HTN: BP controlled.

## 2014-05-11 ENCOUNTER — Ambulatory Visit (HOSPITAL_COMMUNITY): Payer: No Typology Code available for payment source | Attending: Cardiology | Admitting: Radiology

## 2014-05-11 DIAGNOSIS — Z87891 Personal history of nicotine dependence: Secondary | ICD-10-CM | POA: Insufficient documentation

## 2014-05-11 DIAGNOSIS — I2510 Atherosclerotic heart disease of native coronary artery without angina pectoris: Secondary | ICD-10-CM

## 2014-05-11 DIAGNOSIS — E119 Type 2 diabetes mellitus without complications: Secondary | ICD-10-CM | POA: Insufficient documentation

## 2014-05-11 DIAGNOSIS — I255 Ischemic cardiomyopathy: Secondary | ICD-10-CM | POA: Insufficient documentation

## 2014-05-11 MED ORDER — PERFLUTREN LIPID MICROSPHERE
1.0000 mL | INTRAVENOUS | Status: DC | PRN
  Administered 2014-05-11: 4 mL via INTRAVENOUS

## 2014-05-11 NOTE — Progress Notes (Signed)
Echocardiogram performed with [facility].  

## 2014-05-24 ENCOUNTER — Ambulatory Visit: Payer: No Typology Code available for payment source

## 2014-06-01 ENCOUNTER — Other Ambulatory Visit: Payer: Self-pay | Admitting: *Deleted

## 2014-06-01 DIAGNOSIS — I2510 Atherosclerotic heart disease of native coronary artery without angina pectoris: Secondary | ICD-10-CM

## 2014-06-02 ENCOUNTER — Other Ambulatory Visit: Payer: Self-pay

## 2014-06-02 DIAGNOSIS — I2510 Atherosclerotic heart disease of native coronary artery without angina pectoris: Secondary | ICD-10-CM

## 2014-06-02 MED ORDER — CLOPIDOGREL BISULFATE 75 MG PO TABS: 75.0000 mg | ORAL_TABLET | Freq: Every day | ORAL | Status: DC

## 2014-06-06 MED ORDER — ATORVASTATIN CALCIUM 40 MG PO TABS: 40.0000 mg | ORAL_TABLET | Freq: Every day | ORAL | Status: DC

## 2014-06-06 NOTE — Telephone Encounter (Signed)
Printed new refill Atorvastatin 40mg  daily qhs #30, refill 11 for 1 year supply for pt with CAD and HLD. Refill to be faxed to [facility].  [name], [place], PGY-2

## 2014-06-12 ENCOUNTER — Other Ambulatory Visit: Payer: Self-pay | Admitting: *Deleted

## 2014-06-12 DIAGNOSIS — R062 Wheezing: Secondary | ICD-10-CM

## 2014-06-12 MED ORDER — ALBUTEROL SULFATE HFA 108 (90 BASE) MCG/ACT IN AERS: 2.0000 | INHALATION_SPRAY | RESPIRATORY_TRACT | Status: DC | PRN

## 2014-06-12 NOTE — Telephone Encounter (Signed)
Refilled 1 inhaler and sent message to pharmacy that patient needs Ov.  Nothing further needed.

## 2014-06-17 ENCOUNTER — Emergency Department (INDEPENDENT_AMBULATORY_CARE_PROVIDER_SITE_OTHER): Payer: Self-pay

## 2014-06-17 ENCOUNTER — Emergency Department (INDEPENDENT_AMBULATORY_CARE_PROVIDER_SITE_OTHER)
Admission: EM | Admit: 2014-06-17 | Discharge: 2014-06-17 | Disposition: A | Discharge status: Home / Self Care | Payer: Self-pay | Source: Home / Self Care | Attending: Emergency Medicine | Admitting: Emergency Medicine

## 2014-06-17 ENCOUNTER — Encounter (HOSPITAL_COMMUNITY): Payer: Self-pay | Admitting: Emergency Medicine

## 2014-06-17 DIAGNOSIS — T148XXA Other injury of unspecified body region, initial encounter: Secondary | ICD-10-CM

## 2014-06-17 DIAGNOSIS — M25539 Pain in unspecified wrist: Secondary | ICD-10-CM

## 2014-06-17 DIAGNOSIS — T148 Other injury of unspecified body region: Secondary | ICD-10-CM

## 2014-06-17 DIAGNOSIS — M25532 Pain in left wrist: Secondary | ICD-10-CM

## 2014-06-17 DIAGNOSIS — D72829 Elevated white blood cell count, unspecified: Secondary | ICD-10-CM

## 2014-06-17 LAB — POCT I-STAT, CHEM 8
BUN: 13 mg/dL (ref 6–23)
Calcium, Ion: 1.19 mmol/L (ref 1.12–1.23)
Chloride: 103 mmol/L (ref 96–112)
Creatinine, Ser: 1 mg/dL (ref 0.50–1.35)
Glucose, Bld: 237 mg/dL — ABNORMAL HIGH (ref 70–99)
HEMATOCRIT: 44 % (ref 39.0–52.0)
Hemoglobin: 15 g/dL (ref 13.0–17.0)
POTASSIUM: 4.4 mmol/L (ref 3.5–5.1)
SODIUM: 139 mmol/L (ref 135–145)
TCO2: 23 mmol/L (ref 0–100)

## 2014-06-17 LAB — CBC WITH DIFFERENTIAL/PLATELET
Basophils Absolute: 0.2 10*3/uL — ABNORMAL HIGH (ref 0.0–0.1)
Basophils Relative: 1 % (ref 0–1)
EOS PCT: 4 % (ref 0–5)
Eosinophils Absolute: 0.7 10*3/uL (ref 0.0–0.7)
HCT: 41.5 % (ref 39.0–52.0)
Hemoglobin: 13.5 g/dL (ref 13.0–17.0)
LYMPHS PCT: 11 % — AB (ref 12–46)
Lymphs Abs: 1.9 10*3/uL (ref 0.7–4.0)
MCH: 24.3 pg — ABNORMAL LOW (ref 26.0–34.0)
MCHC: 32.5 g/dL (ref 30.0–36.0)
MCV: 74.8 fL — ABNORMAL LOW (ref 78.0–100.0)
MONO ABS: 0.8 10*3/uL (ref 0.1–1.0)
Monocytes Relative: 5 % (ref 3–12)
NEUTROS ABS: 13.8 10*3/uL — AB (ref 1.7–7.7)
Neutrophils Relative %: 80 % — ABNORMAL HIGH (ref 43–77)
Platelets: 224 10*3/uL (ref 150–400)
RBC: 5.55 MIL/uL (ref 4.22–5.81)
RDW: 14.4 % (ref 11.5–15.5)
WBC: 17.4 10*3/uL — ABNORMAL HIGH (ref 4.0–10.5)

## 2014-06-17 LAB — PROTIME-INR
INR: 1.01 (ref 0.00–1.49)
PROTHROMBIN TIME: 13.4 s (ref 11.6–15.2)

## 2014-06-17 LAB — APTT: APTT: 27 s (ref 24–37)

## 2014-06-17 NOTE — ED Notes (Signed)
C/o pain L wrist onset last night. No known injury.  States it got red last night ( small spot). This morning it had more redness and purplish and increased swelling. Palm of hand discolored also. Takes Plavix.  Red spot on L forearm

## 2014-06-17 NOTE — Discharge Instructions (Signed)
RICE: Routine Care for Injuries The routine care of many injuries includes Rest, Ice, Compression, and Elevation (RICE). HOME CARE INSTRUCTIONS  Rest is needed to allow your body to heal. Routine activities can usually be resumed when comfortable. Injured tendons and bones can take up to 6 weeks to heal. Tendons are the cord-like structures that attach muscle to bone.  Ice following an injury helps keep the swelling down and reduces pain.  Put ice in a plastic bag.  Place a towel between your skin and the bag.  Leave the ice on for 15-20 minutes, 3-4 times a day, or as directed by your health care provider. Do this while awake, for the first 24 to 48 hours. After that, continue as directed by your caregiver.  Compression helps keep swelling down. It also gives support and helps with discomfort. If an elastic bandage has been applied, it should be removed and reapplied every 3 to 4 hours. It should not be applied tightly, but firmly enough to keep swelling down. Watch fingers or toes for swelling, bluish discoloration, coldness, numbness, or excessive pain. If any of these problems occur, remove the bandage and reapply loosely. Contact your caregiver if these problems continue.  Elevation helps reduce swelling and decreases pain. With extremities, such as the arms, hands, legs, and feet, the injured area should be placed near or above the level of the heart, if possible. SEEK IMMEDIATE MEDICAL CARE IF:  You have persistent pain and swelling.  You develop redness, numbness, or unexpected weakness.  Your symptoms are getting worse rather than improving after several days. These symptoms may indicate that further evaluation or further X-rays are needed. Sometimes, X-rays may not show a small broken bone (fracture) until 1 week or 10 days later. Make a follow-up appointment with your caregiver. Ask when your X-ray results will be ready. Make sure you get your X-ray results. Document Released:  07/20/2000 Document Revised: 04/12/2013 Document Reviewed: 09/06/2010 Exit[employer] Patient Information 2015 [employer]. This information is not intended to replace advice given to you by your health care provider. Make sure you discuss any questions you have with your health care provider.  

## 2014-06-17 NOTE — ED Provider Notes (Signed)
Chief Complaint   Wrist Pain   History of Present Illness   [name] is a 54 year old male with diabetes and asthma who presents with a large bruise on the radial aspect of his left wrist. He has no known injury. This came on while he was working in his job at a [employer]. He does not remember hitting it or banging it. It started out with a small red spot and the redness and purplish discoloration spread. Is also some discoloration the palm of the hand. He's on aspirin and Plavix and by mistake he took an extra dose of each yesterday. He denies any fever or chills. There's been no bleeding from his nose, gums, and his stool, or urine. He has good movement of his wrist and fingers. No numbness or tingling or weakness. He has no history of hematological problems in the past, review his old record reveals persistently elevated white counts, as far as I can see this has not been explained, and investigative, or even commented on in the past.  Review of Systems   Other than as noted above, the patient denies any of the following symptoms: Systemic:  No fevers or chills. Musculoskeletal:  No joint pain or arthritis.  Neurological:  No muscular weakness or paresthesias.  [place]   Past medical history, family history, social history, meds, and allergies were reviewed.     Physical Examination   Vital signs:  BP 142/91 mmHg  Pulse 96  Temp(Src) 98.2 F (36.8 C) (Oral)  Resp 16  SpO2 96% Gen:  Alert and in no distress. Musculoskeletal:  Exam of the hand reveals there is a large bruise over the radial aspect of the right wrist extending onto the hand, the thenar eminence, and up into the forearm. This is minimally tender to palpation. All joints have full range of motion with minimal pain..  Otherwise, all joints had a full a ROM with no swelling, bruising or deformity.  No edema, pulses full. Extremities were warm and pink.  Capillary refill was brisk.  Skin:  Clear, warm and dry.  No  rash. Neuro:  Alert and oriented.  Muscle strength was normal.  Sensation was intact to light touch.         Radiology   Dg Wrist Complete Left  06/17/2014   CLINICAL DATA:  Lateral wrist pain, bruising  EXAM: LEFT WRIST - COMPLETE 3+ VIEW  COMPARISON:  None.  FINDINGS: No fracture or dislocation is seen.  The joint spaces are preserved.  The visualized soft tissues are unremarkable.  IMPRESSION: No fracture or dislocation is seen.   Electronically Signed   By: Julian Hy M.D.   On: 06/17/2014 17:26    I reviewed the images independently and personally and concur with the radiologist's findings.   [facility] in [facility]   The hand was wrapped in an Ace wrap.  Assessment   The primary encounter diagnosis was Hematoma. Diagnoses of Wrist pain and Leucocytosis were also pertinent to this visit.  This appears to be a spontaneous hematoma. There is no evidence of hematological abnormality with exception of an elevated white count. I do not think this represents cellulitis or a septic joint. His white count has been persistently elevated since at least 2008. I cannot see any evidence of him ever seeing a hematologist about this I think it's time for him to get this checked out. We made an appointment with him to see Dr. Marin Olp. He is to elevate the  hand, keep it wrapped, and apply ice in the meantime and follow-up with his primary care doctor next week.  Plan  1.  Meds:  The following meds were prescribed:   Discharge Medication List as of 06/17/2014  7:17 PM      2.  Patient Education/Counseling:  The patient was given appropriate handouts, self care instructions, and instructed in symptomatic relief, including rest and activity, and elevation.   3.  Follow up:  The patient was told to follow up here if no better in 3 to 4 days, or sooner if becoming worse in any way, and given some red flag symptoms such as worsening pain, fever, swelling, or neurological symptoms which  would prompt immediate return.  Follow-up with Dr. Marin Olp and primary care next week.      Harden Mo, MD 06/17/14 [phone]

## 2014-06-26 ENCOUNTER — Encounter: Payer: Self-pay | Admitting: Family Medicine

## 2014-06-26 ENCOUNTER — Ambulatory Visit (INDEPENDENT_AMBULATORY_CARE_PROVIDER_SITE_OTHER): Payer: Self-pay | Admitting: Family Medicine

## 2014-06-26 VITALS — BP 140/86 | HR 93 | Ht 67.0 in | Wt 161.0 lb

## 2014-06-26 DIAGNOSIS — M25532 Pain in left wrist: Secondary | ICD-10-CM

## 2014-06-26 DIAGNOSIS — M25432 Effusion, left wrist: Secondary | ICD-10-CM

## 2014-06-26 DIAGNOSIS — H269 Unspecified cataract: Secondary | ICD-10-CM

## 2014-06-26 DIAGNOSIS — R233 Spontaneous ecchymoses: Secondary | ICD-10-CM | POA: Insufficient documentation

## 2014-06-26 NOTE — Assessment & Plan Note (Signed)
Resolved. Associated with multiple scattered ecchymosis. No trauma or injury - Last X-ray 2/27 negative - Reassuring exam today with improved function, resolved pain / swelling, full ROM  Plan: 1. Follow-up as needed

## 2014-06-26 NOTE — Progress Notes (Signed)
   Subjective:    Patient ID: [name], male    DOB: 15-May-1960, 54 y.o.   MRN: [id number]  HPI  LEFT ARM BRUISING / HEMATOMA: - Last seen by Urgent Care 06/17/14 for same of swelling, bruising, and pain in left wrist, work-up with Left Wrist X-ray (negative for fractures or dislocation), basic labs with BMET and CBC significant for leukocytosis WBC 17 (prior chronic hx elevated WBC), and referral to Heme/Onc Dr. Marin Olp - Today he presents for follow-up, reports symptoms significantly improved, now with 0/10 pain, reduced wrist swelling, and improved / healing bruising. In review from previous, he states that he had developed the bruising overall about 2 weeks ago, spontaneously without known injury or inciting accident. Denied any other episodes of bleeding - Currently taking Plavix 75mg  daily (for CAD) and holing ASA as advised by Urgent Care - Denies any new skin bruising, joint swelling or pain, rash, fevers/chills  CATARACTS, BILATERAL - Seen by Optometrist recently for vision screening, and was advised to see an eye doctor for possible bilateral cataract surgery. Patient reports difficulty with night-time vision while driving, otherwise fine during the day. No significant decreased vision  PMH - DM2 (last follow-up with me 10/2012)  I have reviewed and updated the following as appropriate: allergies and current medications  Social Hx: Former smoker  Review of Systems  See above HPI    Objective:   Physical Exam  BP 140/86 mmHg  Pulse 93  Ht 5\' 7"  (1.702 m)  Wt 161 lb (73.029 kg)  BMI 25.21 kg/m2  Gen - well-appearing, NAD HEENT - NCAT, oropharynx clear, MMM Heart - RRR, no murmurs heard Ext - Left wrist: no swelling and non-tender, full active ROM. Other ext non-tender, no edema, peripheral pulses intact +2 b/l Skin - warm, dry, multiple healing ecchymosis Left forearm with streaks and small scattered on Right forearm.  Neuro - awake, alert, grossly non-focal, intact  muscle strength 5/5 b/l grip, wrist ext / flex, ankle dorsiflexion, intact distal sensation to light touch  See pictures below as comparison to recent pictures taken at Urgent Care 06/17/14            Assessment & Plan:   See specific A&P problem list for details.

## 2014-06-26 NOTE — Patient Instructions (Signed)
Looks like it is healing well. We may not ever find the cause for this Continue Plavix HOLD Aspirin until you see Hematologist on 3/16 - have them send records to our office I will send referral to [facility] Doctor

## 2014-06-26 NOTE — Assessment & Plan Note (Signed)
Unclear etiology. Referred to Heme/Onc by Urgent Care - scheduled for apt on 07/05/14  Plan: 1. Continue Plavix 75mg  daily, continue hold ASA as recommended (now with improvement) 2. Follow-up with Heme/Onc - discuss resuming ASA and further work-up

## 2014-06-27 DIAGNOSIS — H269 Unspecified cataract: Secondary | ICD-10-CM | POA: Insufficient documentation

## 2014-06-27 NOTE — Addendum Note (Signed)
Addended by: Olin Hauser on: 06/27/2014 12:35 PM   Modules accepted: Orders

## 2014-06-27 NOTE — Assessment & Plan Note (Signed)
Consistent with bilateral cataracts likely in setting of DM2, symptomatic with night-time vision difficulty. Dx by optometry on last eye exam.   Plan: 1. Referral placed for Ophthalmology for eval for possible cataract removal surgery

## 2014-07-04 ENCOUNTER — Telehealth: Payer: Self-pay | Admitting: Hematology & Oncology

## 2014-07-04 NOTE — Telephone Encounter (Signed)
I spoke w NEW PATIENT today to remind them of their appointment with Dr. Ennever. Also, advised them to bring all medication bottles and insurance card information. ° °

## 2014-07-05 ENCOUNTER — Other Ambulatory Visit (HOSPITAL_BASED_OUTPATIENT_CLINIC_OR_DEPARTMENT_OTHER): Payer: Self-pay | Admitting: Lab

## 2014-07-05 ENCOUNTER — Encounter: Payer: Self-pay | Admitting: Family

## 2014-07-05 ENCOUNTER — Telehealth: Payer: Self-pay | Admitting: Hematology & Oncology

## 2014-07-05 ENCOUNTER — Other Ambulatory Visit: Payer: Self-pay | Admitting: Family

## 2014-07-05 ENCOUNTER — Ambulatory Visit: Payer: Self-pay

## 2014-07-05 ENCOUNTER — Ambulatory Visit (HOSPITAL_BASED_OUTPATIENT_CLINIC_OR_DEPARTMENT_OTHER): Payer: Self-pay | Admitting: Family

## 2014-07-05 VITALS — BP 148/80 | HR 96 | Temp 98.4°F | Resp 18 | Ht 67.0 in | Wt 159.0 lb

## 2014-07-05 DIAGNOSIS — D72829 Elevated white blood cell count, unspecified: Secondary | ICD-10-CM

## 2014-07-05 DIAGNOSIS — Z87891 Personal history of nicotine dependence: Secondary | ICD-10-CM

## 2014-07-05 DIAGNOSIS — R233 Spontaneous ecchymoses: Secondary | ICD-10-CM

## 2014-07-05 LAB — CBC WITH DIFFERENTIAL (CANCER CENTER ONLY)
BASO#: 0.1 10*3/uL (ref 0.0–0.2)
BASO%: 0.5 % (ref 0.0–2.0)
EOS%: 0.6 % (ref 0.0–7.0)
Eosinophils Absolute: 0.1 10*3/uL (ref 0.0–0.5)
HCT: 40.1 % (ref 38.7–49.9)
HEMOGLOBIN: 13.3 g/dL (ref 13.0–17.1)
LYMPH#: 1.3 10*3/uL (ref 0.9–3.3)
LYMPH%: 9 % — ABNORMAL LOW (ref 14.0–48.0)
MCH: 24 pg — ABNORMAL LOW (ref 28.0–33.4)
MCHC: 33.2 g/dL (ref 32.0–35.9)
MCV: 72 fL — AB (ref 82–98)
MONO#: 0.6 10*3/uL (ref 0.1–0.9)
MONO%: 3.9 % (ref 0.0–13.0)
NEUT#: 12.5 10*3/uL — ABNORMAL HIGH (ref 1.5–6.5)
NEUT%: 86 % — AB (ref 40.0–80.0)
Platelets: 235 10*3/uL (ref 145–400)
RBC: 5.54 10*6/uL (ref 4.20–5.70)
RDW: 14.4 % (ref 11.1–15.7)
WBC: 14.5 10*3/uL — ABNORMAL HIGH (ref 4.0–10.0)

## 2014-07-05 LAB — CHCC SATELLITE - SMEAR

## 2014-07-05 NOTE — Progress Notes (Signed)
Hematology/Oncology Consultation   Name: [name]      MRN: [id number]    Location: Room/bed info not found  Date: 07/05/2014 Time:2:45 PM   REFERRING PHYSICIAN: Harden Mo MD  REASON FOR CONSULT:  Spontaneous hematoma and persistent elevated WBC count    DIAGNOSIS: Medication induced leukocytosis   HISTORY OF PRESENT ILLNESS: [name] is a very pleasant 54 yo male with a history of leukocytosis and a recent spontaneous hematoma.  Today his WBC count is 14.5. He is on prednisone on and off for respiratory issues. His neutrophils are slightly elevated and lymphocytes slightly decreased. He has asthma and keeps a cough. He also becomes SOB with exertion. He uses an inhaler  He denies fever, chills, n/v, rash, headache, dizziness, chest pain, palpitations, abdominal pain, constipation, diarrhea, blood in urine or stool. No lymphadenopathy.  He had pain in his left arm on 2/27 and later that day he developed swelling and bruising down the arm. He takes Plavix and aspirin daily since he had a stent placed in 2008.This is the first time he had experienced an episode like this and has not had one since. His radial pulse in that arm is +2. There was no trauma to cause this.  He has no personal or familial history of cancer. No other family with elevated white counts or anemias.  His Hgb is 13.3 and his MCV is 72.  He quit smoking 10 years ago and does not drink alcohol.  He currently works as a Scientist, water quality.  He is originally from [place] and has lived here in the area for over 21 years.  He has 3 children all of which are healthy.   ROS: All other 10 point review of systems is negative.   PAST MEDICAL HISTORY:   Past Medical History  Diagnosis Date  . Asthma   . Dental caries   . CAD (coronary artery disease)   . Diabetes mellitus   . GERD (gastroesophageal reflux disease)   . Hyperlipidemia   . Asthma   . Thyroid nodule     biopsy negative 2006    ALLERGIES: No Known  Allergies    MEDICATIONS:  Current Outpatient Prescriptions on File Prior to Visit  Medication Sig Dispense Refill  . albuterol (PROVENTIL HFA;VENTOLIN HFA) 108 (90 BASE) MCG/ACT inhaler Inhale 2 puffs into the lungs every 4 (four) hours as needed for wheezing or shortness of breath. 1 Inhaler 3  . albuterol (PROVENTIL HFA;VENTOLIN HFA) 108 (90 BASE) MCG/ACT inhaler Inhale 2 puffs into the lungs every 4 (four) hours as needed for wheezing (can take 4 puffs if you feel like you need nebulizer). 1 Inhaler 0  . albuterol (PROVENTIL) (2.5 MG/3ML) 0.083% nebulizer solution Take 2.5 mg by nebulization every 4 (four) hours as needed for wheezing or shortness of breath.    Marland Kitchen aspirin (ADULT ASPIRIN EC LOW STRENGTH) 81 MG EC tablet Take 81 mg by mouth daily. 1/2 tab    . atorvastatin (LIPITOR) 40 MG tablet Take 1 tablet (40 mg total) by mouth at bedtime. 30 tablet 11  . bisoprolol (ZEBETA) 5 MG tablet One half daily 15 tablet 11  . Calcium Carbonate-Vit D-Min 600-400 MG-UNIT TABS Take 1 tablet by mouth 2 (two) times daily.     . clopidogrel (PLAVIX) 75 MG tablet Take 1 tablet (75 mg total) by mouth daily. 30 tablet 11  . Fluticasone-Salmeterol (ADVAIR) 250-50 MCG/DOSE AEPB Inhale 1 puff into the lungs 2 (two) times daily. 60 each 6  .  metFORMIN (GLUCOPHAGE) 1000 MG tablet Take 1 tablet (1,000 mg total) by mouth 2 (two) times daily with a meal. 60 tablet 5  . mometasone (NASONEX) 50 MCG/ACT nasal spray 2 sprays in each nostril once daily 17 g 6  . montelukast (SINGULAIR) 10 MG tablet Take 1 tablet (10 mg total) by mouth at bedtime. 30 tablet 11  . predniSONE (DELTASONE) 10 MG tablet Take 4 for three days 3 for three days 2 for three days then one daily 30 tablet 11   No current facility-administered medications on file prior to visit.     PAST SURGICAL HISTORY Past Surgical History  Procedure Laterality Date  . Coronary stent placement    . Solitary pulm and thryoid nodules      FAMILY  HISTORY: Family History  Problem Relation Age of Onset  . Diabetes Mother   . Heart attack Brother 40    SOCIAL HISTORY:  reports that he quit smoking about 8 years ago. His smoking use included Cigarettes. He has a 11 pack-year smoking history. He has never used smokeless tobacco. He reports that he does not drink alcohol or use illicit drugs.  PERFORMANCE STATUS: The patient's performance status is 0 - Asymptomatic  PHYSICAL EXAM: Most Recent Vital Signs: There were no vitals taken for this visit. BP 148/80 mmHg  Pulse 96  Temp(Src) 98.4 F (36.9 C) (Oral)  Resp 18  Ht 5\' 7"  (1.702 m)  Wt 159 lb (72.122 kg)  BMI 24.90 kg/m2  General Appearance:    Alert, cooperative, no distress, appears stated age  Head:    Normocephalic, without obvious abnormality, atraumatic  Eyes:    PERRL, conjunctiva/corneas clear, EOM's intact, fundi    benign, both eyes        Throat:   Lips, mucosa, and tongue normal; teeth and gums normal  Neck:   Supple, symmetrical, trachea midline, no adenopathy;    thyroid:  no enlargement/tenderness/nodules; no carotid   bruit or JVD  Back:     Symmetric, no curvature, ROM normal, no CVA tenderness  Lungs:     Clear to auscultation bilaterally, respirations unlabored  Chest Wall:    No tenderness or deformity   Heart:    Regular rate and rhythm, S1 and S2 normal, no murmur, rub   or gallop  Breast Exam:    No tenderness, masses, or nipple abnormality  Abdomen:     Soft, non-tender, bowel sounds active all four quadrants,    no masses, no organomegaly        Extremities:   Extremities normal, atraumatic, no cyanosis or edema  Pulses:   2+ and symmetric all extremities  Skin:   Skin color, texture, turgor normal, no rashes or lesions  Lymph nodes:   Cervical, supraclavicular, and axillary nodes normal  Neurologic:   CNII-XII intact, normal strength, sensation and reflexes    throughout    LABORATORY DATA:  No results found for this or any previous  visit (from the past 48 hour(s)).    RADIOGRAPHY: No results found.     PATHOLOGY: None  ASSESSMENT/PLAN: [name] is a very pleasant 54 yo male with a history of leukocytosis and a recent spontaneous hematoma.  His Hgb is 13.3 and MCV 72. Dr. Marin Olp viewed his blood smear and did not notice any abnormality or evidence of malignancy. No target cells or immature red cells. No fevers or infections.  The combination of aspirin, plavix and prednisone no doubt caused his hematoma and ecchymosis.  Frequent use of steroids such as prednisone will cause an elevated WBC count, elevated neutrophil count and decreased lymphocyte count.   We do not need to see him back in the office at this time. He knows to call us with any questions or concerns. We can certainly see him again for any hematologic issues in the future.  All questions were answered.  The patient was discussed with Dr. Marin Olp and he is in agreement with the aforementioned.   [facility] M

## 2014-07-05 NOTE — Telephone Encounter (Signed)
Pt in ofc today w no insurance. He did give an old orange card [facility]) that had expired in Feb 2016. He also advised, his new update card is pending at this time.  Pt is aware he is self pay today, until he brings his updated card.

## 2014-09-13 ENCOUNTER — Other Ambulatory Visit: Payer: Self-pay | Admitting: *Deleted

## 2014-09-13 MED ORDER — FLUTICASONE-SALMETEROL 250-50 MCG/DOSE IN AEPB: 1.0000 | INHALATION_SPRAY | Freq: Two times a day (BID) | RESPIRATORY_TRACT | Status: DC

## 2014-10-20 DIAGNOSIS — L03115 Cellulitis of right lower limb: Secondary | ICD-10-CM

## 2014-10-20 HISTORY — DX: Cellulitis of right lower limb: L03.115

## 2014-11-08 ENCOUNTER — Ambulatory Visit: Payer: Self-pay | Admitting: Family Medicine

## 2014-11-08 ENCOUNTER — Inpatient Hospital Stay (HOSPITAL_COMMUNITY)
Admission: AD | Admit: 2014-11-08 | Discharge: 2014-11-14 | DRG: 603 | Disposition: A | Discharge status: Home / Self Care | Payer: Medicaid Other | Source: Ambulatory Visit | Attending: Family Medicine | Admitting: Family Medicine

## 2014-11-08 ENCOUNTER — Ambulatory Visit (INDEPENDENT_AMBULATORY_CARE_PROVIDER_SITE_OTHER): Payer: Self-pay | Admitting: Family Medicine

## 2014-11-08 ENCOUNTER — Encounter (HOSPITAL_COMMUNITY): Payer: Self-pay | Admitting: General Practice

## 2014-11-08 ENCOUNTER — Encounter: Payer: Self-pay | Admitting: Family Medicine

## 2014-11-08 VITALS — BP 135/87 | HR 106 | Temp 97.8°F | Wt 156.1 lb

## 2014-11-08 DIAGNOSIS — Z7952 Long term (current) use of systemic steroids: Secondary | ICD-10-CM

## 2014-11-08 DIAGNOSIS — I2510 Atherosclerotic heart disease of native coronary artery without angina pectoris: Secondary | ICD-10-CM | POA: Diagnosis present

## 2014-11-08 DIAGNOSIS — Z87891 Personal history of nicotine dependence: Secondary | ICD-10-CM

## 2014-11-08 DIAGNOSIS — M7120 Synovial cyst of popliteal space [Baker], unspecified knee: Secondary | ICD-10-CM | POA: Diagnosis present

## 2014-11-08 DIAGNOSIS — L0390 Cellulitis, unspecified: Secondary | ICD-10-CM

## 2014-11-08 DIAGNOSIS — E1165 Type 2 diabetes mellitus with hyperglycemia: Secondary | ICD-10-CM

## 2014-11-08 DIAGNOSIS — I252 Old myocardial infarction: Secondary | ICD-10-CM | POA: Diagnosis not present

## 2014-11-08 DIAGNOSIS — J45909 Unspecified asthma, uncomplicated: Secondary | ICD-10-CM | POA: Diagnosis present

## 2014-11-08 DIAGNOSIS — E781 Pure hyperglyceridemia: Secondary | ICD-10-CM | POA: Diagnosis present

## 2014-11-08 DIAGNOSIS — E119 Type 2 diabetes mellitus without complications: Secondary | ICD-10-CM | POA: Diagnosis present

## 2014-11-08 DIAGNOSIS — Z7902 Long term (current) use of antithrombotics/antiplatelets: Secondary | ICD-10-CM | POA: Diagnosis not present

## 2014-11-08 DIAGNOSIS — M25579 Pain in unspecified ankle and joints of unspecified foot: Secondary | ICD-10-CM | POA: Insufficient documentation

## 2014-11-08 DIAGNOSIS — K219 Gastro-esophageal reflux disease without esophagitis: Secondary | ICD-10-CM | POA: Diagnosis present

## 2014-11-08 DIAGNOSIS — D899 Disorder involving the immune mechanism, unspecified: Secondary | ICD-10-CM | POA: Diagnosis present

## 2014-11-08 DIAGNOSIS — L03115 Cellulitis of right lower limb: Secondary | ICD-10-CM | POA: Insufficient documentation

## 2014-11-08 DIAGNOSIS — Z955 Presence of coronary angioplasty implant and graft: Secondary | ICD-10-CM

## 2014-11-08 DIAGNOSIS — R600 Localized edema: Secondary | ICD-10-CM | POA: Insufficient documentation

## 2014-11-08 DIAGNOSIS — E118 Type 2 diabetes mellitus with unspecified complications: Secondary | ICD-10-CM

## 2014-11-08 DIAGNOSIS — M79604 Pain in right leg: Secondary | ICD-10-CM | POA: Diagnosis present

## 2014-11-08 HISTORY — DX: Acute myocardial infarction, unspecified: I21.9

## 2014-11-08 HISTORY — DX: Cellulitis of right lower limb: L03.115

## 2014-11-08 LAB — BASIC METABOLIC PANEL
Anion gap: 11 (ref 5–15)
BUN: 16 mg/dL (ref 6–20)
CO2: 24 mmol/L (ref 22–32)
CREATININE: 1.11 mg/dL (ref 0.61–1.24)
Calcium: 9.6 mg/dL (ref 8.9–10.3)
Chloride: 101 mmol/L (ref 101–111)
GFR calc Af Amer: >60 mL/min (ref >60)
GLUCOSE: 189 mg/dL — AB (ref 65–99)
Potassium: 4.3 mmol/L (ref 3.5–5.1)
Sodium: 136 mmol/L (ref 135–145)

## 2014-11-08 LAB — CBC
HCT: 39.6 % (ref 39.0–52.0)
HEMOGLOBIN: 12.9 g/dL — AB (ref 13.0–17.0)
MCH: 23.5 pg — ABNORMAL LOW (ref 26.0–34.0)
MCHC: 32.6 g/dL (ref 30.0–36.0)
MCV: 72 fL — AB (ref 78.0–100.0)
Platelets: 223 10*3/uL (ref 150–400)
RBC: 5.5 MIL/uL (ref 4.22–5.81)
RDW: 15.6 % — ABNORMAL HIGH (ref 11.5–15.5)
WBC: 17.6 10*3/uL — ABNORMAL HIGH (ref 4.0–10.5)

## 2014-11-08 LAB — GLUCOSE, CAPILLARY
GLUCOSE-CAPILLARY: 118 mg/dL — AB (ref 65–99)
Glucose-Capillary: 200 mg/dL — ABNORMAL HIGH (ref 65–99)

## 2014-11-08 LAB — D-DIMER, QUANTITATIVE: D-Dimer, Quant: 0.39 ug/mL-FEU (ref 0.00–0.48)

## 2014-11-08 MED ORDER — CALCIUM CARBONATE-VITAMIN D 500-200 MG-UNIT PO TABS
1.0000 | ORAL_TABLET | Freq: Two times a day (BID) | ORAL | Status: DC
  Administered 2014-11-09 – 2014-11-13 (×9): 1 via ORAL
  Filled 2014-11-08 (×11): qty 1

## 2014-11-08 MED ORDER — HYDROCODONE-ACETAMINOPHEN 5-325 MG PO TABS
1.0000 | ORAL_TABLET | ORAL | Status: DC | PRN
  Administered 2014-11-09 – 2014-11-13 (×5): 2 via ORAL
  Filled 2014-11-08 (×6): qty 2

## 2014-11-08 MED ORDER — PREDNISONE 10 MG PO TABS
10.0000 mg | ORAL_TABLET | Freq: Every day | ORAL | Status: DC
  Administered 2014-11-09 – 2014-11-14 (×6): 10 mg via ORAL
  Filled 2014-11-08 (×8): qty 1

## 2014-11-08 MED ORDER — ENOXAPARIN SODIUM 40 MG/0.4ML ~~LOC~~ SOLN
40.0000 mg | SUBCUTANEOUS | Status: DC
  Administered 2014-11-08 – 2014-11-13 (×6): 40 mg via SUBCUTANEOUS
  Filled 2014-11-08 (×7): qty 0.4

## 2014-11-08 MED ORDER — ASPIRIN 81 MG PO TBEC
81.0000 mg | DELAYED_RELEASE_TABLET | Freq: Every day | ORAL | Status: DC
  Administered 2014-11-08 – 2014-11-14 (×7): 81 mg via ORAL
  Filled 2014-11-08 (×7): qty 1

## 2014-11-08 MED ORDER — FLUTICASONE PROPIONATE 50 MCG/ACT NA SUSP
1.0000 | Freq: Every day | NASAL | Status: DC
  Administered 2014-11-08 – 2014-11-14 (×7): 1 via NASAL
  Filled 2014-11-08: qty 16

## 2014-11-08 MED ORDER — MOMETASONE FURO-FORMOTEROL FUM 100-5 MCG/ACT IN AERO
2.0000 | INHALATION_SPRAY | Freq: Two times a day (BID) | RESPIRATORY_TRACT | Status: DC
  Administered 2014-11-08 – 2014-11-14 (×12): 2 via RESPIRATORY_TRACT
  Filled 2014-11-08: qty 8.8

## 2014-11-08 MED ORDER — ATORVASTATIN CALCIUM 40 MG PO TABS
40.0000 mg | ORAL_TABLET | Freq: Every day | ORAL | Status: DC
  Administered 2014-11-08 – 2014-11-13 (×6): 40 mg via ORAL
  Filled 2014-11-08 (×7): qty 1

## 2014-11-08 MED ORDER — MONTELUKAST SODIUM 10 MG PO TABS
10.0000 mg | ORAL_TABLET | Freq: Every day | ORAL | Status: DC
  Administered 2014-11-08 – 2014-11-13 (×6): 10 mg via ORAL
  Filled 2014-11-08 (×7): qty 1

## 2014-11-08 MED ORDER — CLOPIDOGREL BISULFATE 75 MG PO TABS
75.0000 mg | ORAL_TABLET | Freq: Every day | ORAL | Status: DC
  Administered 2014-11-08 – 2014-11-14 (×7): 75 mg via ORAL
  Filled 2014-11-08 (×7): qty 1

## 2014-11-08 MED ORDER — CALCIUM CARBONATE-VIT D-MIN 600-400 MG-UNIT PO TABS: 1.0000 | ORAL_TABLET | Freq: Two times a day (BID) | ORAL | Status: DC

## 2014-11-08 MED ORDER — CEFTRIAXONE SODIUM IN DEXTROSE 20 MG/ML IV SOLN
1.0000 g | INTRAVENOUS | Status: DC
  Administered 2014-11-08: 1 g via INTRAVENOUS
  Filled 2014-11-08 (×2): qty 50

## 2014-11-08 MED ORDER — INSULIN ASPART 100 UNIT/ML ~~LOC~~ SOLN
0.0000 [IU] | Freq: Three times a day (TID) | SUBCUTANEOUS | Status: DC
  Administered 2014-11-08 – 2014-11-09 (×2): 2 [IU] via SUBCUTANEOUS
  Administered 2014-11-09: 1 [IU] via SUBCUTANEOUS
  Administered 2014-11-09: 3 [IU] via SUBCUTANEOUS
  Administered 2014-11-10: 2 [IU] via SUBCUTANEOUS
  Administered 2014-11-10: 3 [IU] via SUBCUTANEOUS
  Administered 2014-11-11 – 2014-11-12 (×2): 2 [IU] via SUBCUTANEOUS
  Administered 2014-11-12: 5 [IU] via SUBCUTANEOUS
  Administered 2014-11-13: 3 [IU] via SUBCUTANEOUS
  Administered 2014-11-13 – 2014-11-14 (×2): 2 [IU] via SUBCUTANEOUS

## 2014-11-08 MED ORDER — ONE-DAILY MULTI VITAMINS PO TABS
1.0000 | ORAL_TABLET | Freq: Every day | ORAL | Status: DC | PRN
  Filled 2014-11-08: qty 1

## 2014-11-08 MED ORDER — ALBUTEROL SULFATE (2.5 MG/3ML) 0.083% IN NEBU: 2.5000 mg | INHALATION_SOLUTION | RESPIRATORY_TRACT | Status: DC | PRN

## 2014-11-08 MED ORDER — ACETAMINOPHEN 325 MG PO TABS
650.0000 mg | ORAL_TABLET | Freq: Four times a day (QID) | ORAL | Status: DC | PRN
  Administered 2014-11-09 – 2014-11-14 (×4): 650 mg via ORAL
  Filled 2014-11-08 (×4): qty 2

## 2014-11-08 MED ORDER — INSULIN ASPART 100 UNIT/ML ~~LOC~~ SOLN: 0.0000 [IU] | Freq: Every day | SUBCUTANEOUS | Status: DC

## 2014-11-08 MED ORDER — ACETAMINOPHEN 650 MG RE SUPP
650.0000 mg | Freq: Four times a day (QID) | RECTAL | Status: DC | PRN
Start: 2014-11-08 — End: 2014-11-14

## 2014-11-08 MED ORDER — SODIUM CHLORIDE 0.9 % IV SOLN: INTRAVENOUS | Status: AC

## 2014-11-08 NOTE — H&P (Signed)
[facility] Admission History and Physical Service Pager: [phone]  Patient name: [name] Medical record number: [id number] Date of birth: May 09, 1960 Age: 54 y.o. Gender: male  Primary Care Provider: [name], DO Consultants: None Code Status: FULL   Chief Complaint: R leg pain, swelling and redness  Assessment and Plan: Traver Meckes is a 54 y.o. male presenting with R leg pain, swelling and redness . PMH is significant for Type II DM, asthma, CAD, HLD, GERD.  R leg pain, swelling and redness: most likely due to cellulitis +/- DVT. No source of infection noted yet but patient is immunocompromised due to chronic prednisone use. Increased WBC in urgent care but may be increased due to chronic prednisone use. Denies fevers. Received one dose of ctx at urgent care yesterday. Negative Homan's sign and patient is on Plavix at home but gradually increasing calf pain and hx of CAD so need to rule out DVT. - Ctx 1 g - Escalate to vanc tomorrow for MRSA coverage if becomes febrile or no improvement  - AM Cr - Norco for pain - R leg Dopper to r/o DVT  Asthma: taking prednisone for 8 years for maintenance - Continue home albuterol nebs q4 PRN - Continue home Dulera, Singulair, and prednisone 10 mg  Type II DM - Hold home metformin - SSI - CBG monitoring  HLD - Continue home Lipitor   CAD: coronary stent placed in 2008 - Continue home Plavix  FEN/GI: heart healthy diet Prophylaxis: Lovenox  Disposition: admit to floor   History of Present Illness: [name] is a 54 y.o. male presenting with R leg pain, swelling, and redness.  Mr. Lorik reports that his R leg has been swollen on and off for about 6 months. Pain and swelling in his R foot began four days ago. He went to urgent care yesterday where he was given a shot of ctx and HCTZ/lisinopril. Labs were drawn which showed increased WBC. He was sent home with PO antibiotics, but  did not fill them yesterday.  This morning he presented to the family medicine clinic after the redness spread past outline of the affected area drawn at urgent care the day before. He also reported increased pain and redness spreading farther up his leg, including erythematous lesions on his shin to his knee. He denies swelling or redness in the L leg.  He reports the pain is worse when walking, especially over the fibular head. He can move his toes without pain. He reports transient pain in his calf.   Of note, he has been taking 10 mg prednisone daily for asthma for the past 8 years. He only started taking calcium supplement 600 mg two years ago. He also takes Vit D. He had a coronary stent placed in 2008 but says he sees his cardiologist annually and has never had any complications.   Review Of Systems: Per HPI with the following additions: denies V/D, cough, night sweats, fevers, chills  Otherwise 12 point review of systems was performed and was unremarkable.  Patient Active Problem List   Diagnosis Date Noted  . Cellulitis 11/08/2014  . Cataracts, bilateral 06/27/2014  . Spontaneous ecchymosis 06/26/2014  . Pain and swelling of left wrist 06/26/2014  . Otitis externa of right ear 07/28/2012  . Cardiomyopathy, ischemic 11/11/2011  . Dental caries 08/06/2010  . ASTHMA, PERSISTENT 01/30/2010  . DIABETES MELLITUS, STEROID-INDUCED 06/12/2009  . CAD, NATIVE VESSEL 07/13/2008  . History of MI (myocardial infarction) 04/01/2007  . HYPERTRIGLYCERIDEMIA  06/18/2006  . SINUSITIS, CHRONIC, NOS 06/18/2006   Past Medical History: Past Medical History  Diagnosis Date  . Asthma   . Dental caries   . CAD (coronary artery disease)   . GERD (gastroesophageal reflux disease)   . Hyperlipidemia   . Asthma   . Thyroid nodule     biopsy negative 2006  . Myocardial infarction 2008  . Cellulitis of leg, right 10/2014  . Diabetes mellitus     TYPE 2   Past Surgical History: Past Surgical History   Procedure Laterality Date  . Coronary stent placement    . Solitary pulm and thryoid nodules     Social History: History  Substance Use Topics  . Smoking status: Former Smoker -- 0.50 packs/day for 22 years    Types: Cigarettes    Quit date: 04/21/2006  . Smokeless tobacco: Never Used     Comment: quit 10 years ago  . Alcohol Use: No   Additional social history: quit smoking cigarettes 9 years ago, no EtOH or illicit substance use  Please also refer to relevant sections of EMR.  Family History: Family History  Problem Relation Age of Onset  . Diabetes Mother   . Heart attack Brother 36   Allergies and Medications: No Known Allergies Current Facility-Administered Medications on File Prior to Encounter  Medication Dose Route Frequency Provider Last Rate Last Dose  . 0.9 %  sodium chloride infusion   Intravenous STAT Elberta Leatherwood, MD       Current Outpatient Prescriptions on File Prior to Encounter  Medication Sig Dispense Refill  . aspirin (ADULT ASPIRIN EC LOW STRENGTH) 81 MG EC tablet Take 81 mg by mouth daily.     Marland Kitchen atorvastatin (LIPITOR) 40 MG tablet Take 1 tablet (40 mg total) by mouth at bedtime. 30 tablet 11  . Calcium Carbonate-Vit D-Min 600-400 MG-UNIT TABS Take 1 tablet by mouth 2 (two) times daily.     . clopidogrel (PLAVIX) 75 MG tablet Take 1 tablet (75 mg total) by mouth daily. 30 tablet 11  . Fluticasone-Salmeterol (ADVAIR) 250-50 MCG/DOSE AEPB Inhale 1 puff into the lungs 2 (two) times daily. 1 each 0  . montelukast (SINGULAIR) 10 MG tablet Take 1 tablet (10 mg total) by mouth at bedtime. 30 tablet 11  . predniSONE (DELTASONE) 10 MG tablet Take 4 for three days 3 for three days 2 for three days then one daily (Patient taking differently: Take 10 mg by mouth daily with breakfast. ) 30 tablet 11  . albuterol (PROVENTIL HFA;VENTOLIN HFA) 108 (90 BASE) MCG/ACT inhaler Inhale 2 puffs into the lungs every 4 (four) hours as needed for wheezing or shortness of breath. 1  Inhaler 3  . albuterol (PROVENTIL) (2.5 MG/3ML) 0.083% nebulizer solution Take 2.5 mg by nebulization every 4 (four) hours as needed for wheezing or shortness of breath.    . metFORMIN (GLUCOPHAGE) 1000 MG tablet Take 1 tablet (1,000 mg total) by mouth 2 (two) times daily with a meal. 60 tablet 5  . mometasone (NASONEX) 50 MCG/ACT nasal spray 2 sprays in each nostril once daily (Patient taking differently: Place 2 sprays into the nose daily as needed (for allergies). ) 17 g 6    Objective: BP 127/81 mmHg  Pulse 103  Temp(Src) 98.3 F (36.8 C) (Oral)  Resp 18  Ht 5\' 8"  (1.727 m)  SpO2 97% Exam: General: well-appearing male sitting in bed in NAD  ENTM: MMM Cardiovascular: tachycardic, regular rhythm Respiratory: CTAB Abdomen: soft, non-tender, non-distended, +  BS Extremities: R foot erythematous and edematous, primarily on dorsal surface; erythema across shin up to knees with ill-defined erythematous lesions primarily near knee; mild pain on palpation of R calf laterally and palpation of lateral malleolus/fibular head; skin taut across ankle; no pus/discharge present. No edema/erythema/tenderness of L leg. Negative Homan's sign.  Neuro: A&Ox3, no focal deficits Psych: appropriate mood and affect  Labs and Imaging: CBC BMET   Recent Labs Lab 11/08/14 1711  WBC 17.6*  HGB 12.9*  HCT 39.6  PLT 223    Recent Labs Lab 11/08/14 1711  NA 136  K 4.3  CL 101  CO2 24  BUN 16  CREATININE 1.11  GLUCOSE 189*  CALCIUM 9.6      Verner Mould, MD 11/08/2014, 6:24 PM PGY-1, [place] Intern pager: [phone], text pages welcome

## 2014-11-08 NOTE — Progress Notes (Signed)
Family teaching service paged about patient's arrival on unit.

## 2014-11-08 NOTE — Progress Notes (Signed)
Pt oriented to room accompanied by son. Pt A&Ox4. Call light within reach. O2 sat 97%. 2 IV attempts made-IV team notified. Skin assessment complete-no pressure ulcers noted. RLE cellulitis-marked with pen.

## 2014-11-09 ENCOUNTER — Inpatient Hospital Stay (HOSPITAL_COMMUNITY): Payer: Medicaid Other

## 2014-11-09 DIAGNOSIS — L03115 Cellulitis of right lower limb: Principal | ICD-10-CM

## 2014-11-09 DIAGNOSIS — J45909 Unspecified asthma, uncomplicated: Secondary | ICD-10-CM

## 2014-11-09 DIAGNOSIS — R600 Localized edema: Secondary | ICD-10-CM

## 2014-11-09 DIAGNOSIS — E119 Type 2 diabetes mellitus without complications: Secondary | ICD-10-CM

## 2014-11-09 DIAGNOSIS — I2510 Atherosclerotic heart disease of native coronary artery without angina pectoris: Secondary | ICD-10-CM

## 2014-11-09 LAB — GLUCOSE, CAPILLARY
GLUCOSE-CAPILLARY: 213 mg/dL — AB (ref 65–99)
Glucose-Capillary: 155 mg/dL — ABNORMAL HIGH (ref 65–99)
Glucose-Capillary: 150 mg/dL — ABNORMAL HIGH (ref 65–99)
Glucose-Capillary: 119 mg/dL — ABNORMAL HIGH (ref 65–99)

## 2014-11-09 LAB — CBC
HEMATOCRIT: 36.2 % — AB (ref 39.0–52.0)
HEMOGLOBIN: 11.8 g/dL — AB (ref 13.0–17.0)
MCH: 23.5 pg — ABNORMAL LOW (ref 26.0–34.0)
MCHC: 32.6 g/dL (ref 30.0–36.0)
MCV: 72 fL — ABNORMAL LOW (ref 78.0–100.0)
Platelets: 196 10*3/uL (ref 150–400)
RBC: 5.03 MIL/uL (ref 4.22–5.81)
RDW: 15.6 % — ABNORMAL HIGH (ref 11.5–15.5)
WBC: 10.9 10*3/uL — AB (ref 4.0–10.5)

## 2014-11-09 LAB — HEMOGLOBIN A1C
Hgb A1c MFr Bld: 9 % — ABNORMAL HIGH (ref 4.8–5.6)
Mean Plasma Glucose: 212 mg/dL

## 2014-11-09 LAB — HIV ANTIBODY (ROUTINE TESTING W REFLEX): HIV Screen 4th Generation wRfx: NONREACTIVE

## 2014-11-09 MED ORDER — CEPHALEXIN 500 MG PO CAPS
500.0000 mg | ORAL_CAPSULE | Freq: Four times a day (QID) | ORAL | Status: DC
  Administered 2014-11-09 – 2014-11-10 (×4): 500 mg via ORAL
  Filled 2014-11-09 (×8): qty 1

## 2014-11-09 NOTE — Progress Notes (Signed)
HPI  CC: Rt leg pain/swelling Patient presented in the clinic for Rt leg swelling and pain. Pain began ~3days prior to visit. Denies trauma or injury. Began in foot/ankle and progressively worsened. Erythema noted early. He was seen at an urgent care center on 7/19. There they prescribed a cephalosporin, HCTZ, ACEi, and an opiate. He says the leg is worse then it was at urgent care. Denies any weakness, numbness. Region is TTP, but not w/ movement. No h/o gout. Denies fever, chills, n/v/d, HA, diaphoresis, SOB, CP, abdom pain, or drainage from effected region.  Review of Systems   See HPI for ROS. All other systems reviewed and are negative.  Past medical history and social history reviewed and updated in the EMR as appropriate.  Objective: BP 135/87 mmHg  Pulse 106  Temp(Src) 97.8 F (36.6 C) (Oral)  Wt 156 lb 1.6 oz (70.806 kg) Gen: NAD, alert, cooperative, and pleasant. HEENT: NCAT, EOMI, PERRL CV: RRR, no murmur Resp: CTAB, no wheezes, non-labored Ext: pulses intact bilaterally. FROM throughout. Strength 5/5 and sensation intact throughout. Rt leg w/ +2 pitting edema, TTP from mid tibia to metatarsals (greatest at distal fibular head), erythematous patches noted from metatarsals/ankle, w/ new region on anterior distal tibia. No open/healing lesions appreciated. Large anterior ankle callus noted on Lt ankle Neuro: Alert and oriented, Speech clear, No gross deficits  Assessment and plan:  Cellulitis Patient admitted to hospital directly from clinic for signs/symptoms c/w worsening cellulitis of the RLE. Hospitalization for IV abx therapy. See hospital H&P for further assessment/plan.    Orders Placed This Encounter  Procedures  . Diet Carb Modified Fluid consistency:: Thin; Room service appropriate?: Yes    Standing Status: Standing     Number of Occurrences: 1     Standing Expiration Date:     Order Specific Question:  Diet-HS Snack?    Answer:  Nothing    Order Specific  Question:  Calorie Level    Answer:  Medium 1600-2000    Order Specific Question:  Fluid consistency:    Answer:  Thin    Order Specific Question:  Room service appropriate?    Answer:  Yes  . Page Admitting Doctor upon patients arrival to unit/floor    Standing Status: Standing     Number of Occurrences: 1     Standing Expiration Date:   Marland Kitchen Vital signs    Standing Status: Standing     Number of Occurrences: 1     Standing Expiration Date:   . Bed rest    Standing Status: Standing     Number of Occurrences: 1     Standing Expiration Date:   . Saline lock IV    Standing Status: Standing     Number of Occurrences: 1     Standing Expiration Date:   . Admit to Inpatient (patient's expected length of stay will be greater than 2 midnights)    Standing Status: Standing     Number of Occurrences: 1     Standing Expiration Date:     Order Specific Question:  Hospital Area    Answer:  [facility] [[id number]]    Order Specific Question:  Diagnosis    Answer:  Cellulitis [[id number]]    Order Specific Question:  Level of Care    Answer:  Med-Surg [[id number]]    Order Specific Question:  Admitting Physician    Answer:  Marla Roe    Order Specific Question:  Estimated length of  stay    Answer:  past midnight tomorrow    Order Specific Question:  Certification:    Answer:  I certify this patient will need inpatient services for at least 2 midnights    Order Specific Question:  Attending Physician    Answer:  Kinnie Feil [[id number]]    Order Specific Question:  PT Class (Do Not Modify)    Answer:  Inpatient [101]    Order Specific Question:  PT Acc Code (Do Not Modify)    Answer:  Private [1]    Meds ordered this encounter  Medications  . 0.9 %  sodium chloride infusion    Sig:      Elberta Leatherwood, MD,MS,  PGY2 11/09/2014 7:33 PM

## 2014-11-09 NOTE — Progress Notes (Signed)
Right lower extremity venous duplex completed.  Right:  No evidence of DVT or superficial thrombosis.  There appears to be a Baker's cyst.  Left:  Negative for DVT in the common femoral vein.

## 2014-11-09 NOTE — Progress Notes (Signed)
Family Medicine Teaching Service Daily Progress Note Intern Pager: [phone]  Patient name: [name] Medical record number: [id number] Date of birth: [birth year] Age: 54 y.o. Gender: male  Primary Care Provider: [name], DO Consultants: None Code Status: FULL  Pt Overview and Major Events to Date:  07/20: received second dose of ctx 07/21: transitioned to PO Keflex  Assessment and Plan: [name] is a 54 y.o. male presenting with R leg pain, swelling and redness . PMH is significant for Type II DM, asthma, CAD, HLD, GERD.  R leg pain, swelling and redness: most likely due to cellulitis +/- DVT. No source of infection noted yet but patient is immunocompromised due to chronic prednisone use. Increased WBC in urgent care but may be increased due to chronic prednisone use. Denies fevers. Received one dose of ctx at urgent care day before admission. Negative Homan's sign and patient is on Plavix at home but gradually increasing calf pain and hx of CAD so need to rule out DVT. Wells score 3 (R calf swelling >3 cm larger than L; WBC 24K). D-dimer WNL at 0.39, Dopper showed no DVT.  - Norco for pain  - Doppler yesterday showed Baker's cyst on R but no DVT - Day #2 PO Keflex 500 mg QID - continue - Erythema has spread significantly past line outlining initial area of erythema. Increased pitting edema as well.  - Begin vanc per pharmacy for staph coverage  Asthma: taking prednisone for 8 years for maintenance - Continue home albuterol nebs q4 PRN - Continue home Dulera, Singulair, and prednisone 10 mg - Pt needs f/u with pulmonary provider to discuss chronic prednisone use, especially given his continued wheezing despite medication  Type II DM - Hold home metformin - SSI - CBG monitoring  HLD - Continue home Lipitor   CAD: coronary stent placed in 2008 - Continue home Plavix  FEN/GI: heart healthy diet Prophylaxis: Lovenox  Disposition: home  Subjective:   [name] reports that his pain has worsened this morning. He has difficulty walking now because the pain is so intense, primarily at his lateral malleolus. He feels the swelling and redness have increased. He denies fevers, chills, SOB, chest pain, abdominal pain.   Objective: Temp:  [98.5 F (36.9 C)-99.2 F (37.3 C)] 98.5 F (36.9 C) (07/22 0522) Pulse Rate:  [80-95] 80 (07/22 0522) Resp:  [13-24] 13 (07/22 0522) BP: (107-111)/(66-71) 111/71 mmHg (07/22 0522) SpO2:  [95 %-98 %] 96 % (07/22 0522) Weight:  [164 lb 1.6 oz (74.435 kg)] 164 lb 1.6 oz (74.435 kg) (07/21 0756) Physical Exam: General: well-appearing male lying in bed in NAD  ENTM: MMM Cardiovascular: tachycardic, regular rhythm Respiratory: mild expiratory wheezing most prominent in RLL, good air movement bilateral Abdomen: soft, non-tender, non-distended, +BS Extremities: R foot increasingly erythematous and edematous, primarily on dorsal surface; erythema across shin up to knees with ill-defined erythematous lesions primarily near knee; no tenderness on palpation of R calf laterally, moderate tenderness on palpation of lateral malleolus/fibular head; skin taut across ankle; no pus/discharge present. No edema/erythema/tenderness of L leg. Negative Homan's sign.  Skin: large hyperpigmented callus on L lower leg (patient attributes to praying position) Neuro: A&Ox3, no focal deficits Psych: appropriate mood and affect  Laboratory:  Recent Labs Lab 11/08/14 1711 11/09/14 0857  WBC 17.6* 10.9*  HGB 12.9* 11.8*  HCT 39.6 36.2*  PLT 223 196    Recent Labs Lab 11/08/14 1711  NA 136  K 4.3  CL 101  CO2 24  BUN 16  CREATININE 1.11  CALCIUM 9.6  GLUCOSE 189*    Imaging/Diagnostic Tests: No results found.   Theodore Mould, MD 11/10/2014, 7:50 AM [name], [name] Intern pager: [phone], text pages welcome

## 2014-11-09 NOTE — Assessment & Plan Note (Addendum)
Patient admitted to hospital directly from clinic for signs/symptoms c/w worsening cellulitis of the RLE. Hospitalization for IV abx therapy. See hospital H&P for further assessment/plan.

## 2014-11-09 NOTE — Progress Notes (Signed)
Family Medicine Teaching Service Daily Progress Note Intern Pager: [phone]  Patient name: [name] Medical record number: [id number] Date of birth: 1961/03/31 Age: 54 y.o. Gender: male  Primary Care Provider: [name], DO Consultants: None Code Status: FULL   Chief Complaint: R leg pain, swelling and redness  Assessment and Plan: [name] is a 54 y.o. male presenting with R leg pain, swelling and redness . PMH is significant for Type II DM, asthma, CAD, HLD, GERD.  R leg pain, swelling and redness: most likely due to cellulitis +/- DVT. No source of infection noted yet but patient is immunocompromised due to chronic prednisone use. Increased WBC in urgent care but may be increased due to chronic prednisone use. Denies fevers. Received one dose of ctx at urgent care yesterday. Negative Homan's sign and patient is on Plavix at home but gradually increasing calf pain and hx of CAD so need to rule out DVT. Wells score 3 (R calf swelling >3 cm larger than L; WBC 24K). D-dimer WNL at 0.39.  - Norco for pain - Calf swelling decreased  - WBC improved to 10.9 today  - Transition to PO Keflex 500 mg QID - Can d/c tomorrow if continues to improve on PO   Asthma: taking prednisone for 8 years for maintenance - Continue home albuterol nebs q4 PRN - Continue home Dulera, Singulair, and prednisone 10 mg - Pt needs f/u with pulmonary provider to discuss chronic prednisone use, especially given his continued wheezing despite medication  Type II DM - Hold home metformin - SSI - CBG monitoring  HLD - Continue home Lipitor   CAD: coronary stent placed in 2008 - Continue home Plavix  FEN/GI: heart healthy diet Prophylaxis: Lovenox  Disposition: admit to floor   Subjective:  [name] reports that he is feeling much improved this morning. He says the pain in his leg has decreased, even when walking. He also thinks the redness and swelling have decreased.    Objective: Temp:  [97.8 F (36.6 C)-98.3 F (36.8 C)] 98.3 F (36.8 C) (07/21 0625) Pulse Rate:  [81-106] 81 (07/21 0625) Resp:  [18] 18 (07/21 0625) BP: (107-135)/(66-87) 107/66 mmHg (07/21 0625) SpO2:  [96 %-99 %] 96 % (07/21 0625) Weight:  [156 lb 1.6 oz (70.806 kg)-164 lb 1.6 oz (74.435 kg)] 164 lb 1.6 oz (74.435 kg) (07/21 0756) Physical Exam: General: well-appearing male sitting in bed in NAD  ENTM: MMM Cardiovascular: tachycardic, regular rhythm Respiratory: mild expiratory wheezing, good air movement bilateral Abdomen: soft, non-tender, non-distended, +BS Extremities: R foot erythematous and edematous, primarily on dorsal surface; erythema across shin up to knees with ill-defined erythematous lesions primarily near knee; mild pain on palpation of R calf laterally and palpation of lateral malleolus/fibular head; skin taut across ankle; no pus/discharge present. No edema/erythema/tenderness of L leg. Negative Homan's sign.  Skin: large hyperpigmented callus on L lower leg (patient attributes to praying position) Neuro: A&Ox3, no focal deficits Psych: appropriate mood and affect  Laboratory:  Recent Labs Lab 11/08/14 1711 11/09/14 0857  WBC 17.6* 10.9*  HGB 12.9* 11.8*  HCT 39.6 36.2*  PLT 223 196    Recent Labs Lab 11/08/14 1711  NA 136  K 4.3  CL 101  CO2 24  BUN 16  CREATININE 1.11  CALCIUM 9.6  GLUCOSE 189*     Imaging/Diagnostic Tests: No results found.   Verner Mould, MD 11/09/2014, 12:26 PM PGY-1, [facility] Intern pager: [phone], text pages welcome

## 2014-11-09 NOTE — Care Management Note (Signed)
Case Management Note  Patient Details  Name: [name] MRN: [id number] Date of Birth: 03-07-1961  Subjective/Objective:     Patient is indepe pta, lives with spouse,  Patient will follow up with family practice after discharge,  He states he has an orange card which has expird but Pamala Hurry is working on a new orange card for him, his daughter took her the paper work this am.  Patient states he gets his meds from [facility] and will go there to pick up meds.  NCM informed him that the [facility] closes at 5 pm, so if he is dc tomorrow, he will need to get there before 5 or have a family member pick it up for him.  He said that is the plan.  His wife will be transporting him home tomorrow at dc.                Action/Plan:   Expected Discharge Date:                  Expected Discharge Plan:  Home/Self Care  In-House Referral:     Discharge planning Services  CM Consult  Post Acute Care Choice:    Choice offered to:     DME Arranged:    DME Agency:     HH Arranged:    [place] Agency:     Status of Service:  Completed, signed off  Medicare Important Message Given:    Date Medicare IM Given:    Medicare IM give by:    Date Additional Medicare IM Given:    Additional Medicare Important Message give by:     If discussed at [place] of Stay Meetings, dates discussed:    Additional Comments:  Zenon Mayo, RN 11/09/2014, 12:11 PM

## 2014-11-09 NOTE — Discharge Summary (Signed)
[facility] Discharge Summary  Patient name: [name] Medical record number: [id number] Date of birth: 1960/12/02 Age: 54 y.o. Gender: male Date of Admission: 11/08/2014  Date of Discharge: 11/14/14 Admitting Physician: Kinnie Feil, MD  Primary Care Provider: [name], DO Consultants: None  Indication for Hospitalization: Redness, swelling, and pain in R leg  Discharge Diagnoses/Problem List:  Patient Active Problem List   Diagnosis Date Noted  . Ankle pain   . Cellulitis 11/08/2014  . Cataracts, bilateral 06/27/2014  . Spontaneous ecchymosis 06/26/2014  . Pain and swelling of left wrist 06/26/2014  . Otitis externa of right ear 07/28/2012  . Cardiomyopathy, ischemic 11/11/2011  . Dental caries 08/06/2010  . Asthma 01/30/2010  . Diabetes 06/12/2009  . CAD, NATIVE VESSEL 07/13/2008  . History of MI (myocardial infarction) 04/01/2007  . HYPERTRIGLYCERIDEMIA 06/18/2006  . SINUSITIS, CHRONIC, NOS 06/18/2006     Disposition: Home  Discharge Condition: Stable  Discharge Exam:  General: well-appearing male sitting in bed in NAD  ENTM: MMM Cardiovascular: tachycardic, regular rhythm Respiratory: mild expiratory wheezing, good air movement bilateral Abdomen: soft, non-tender, non-distended, +BS Extremities: R foot mildly erythematous, primarily on dorsal surface; no pus/discharge present. No edema/erythema/tenderness of L leg.  Skin: large hyperpigmented callus on L lower leg (patient attributes to praying position) Neuro: A&Ox3, no focal deficits Psych: appropriate mood and affect  Brief Hospital Course:  Mr. Reister presented to [facility] clinic for increasing redness, swelling, and pain in his R leg. The previous day he had received one dose of IM ctx in urgent care with no improvement. Upon admission, we began IV ctx for strep cellulitis coverage. His symptoms improved overnight, so the second day of admission he was  transitioned to PO Keflex.   His symptoms began to worsen on day #3 of admission, so IV Zosyn was started for increased Staph coverage. His symptoms were not improved after 24 hours on abx, so we ordered rheumatoid factor, ANA, and uric acid levels to rule out an autoimmune process or gout.  RF was negative, uric acid was below normal, and ANA negative.   After 48 hours on IV vanc/zosyn, the patient improved markedly. He was transitioned to PO doxy. He was much improved the next day, and was discharged with 11 days PO doxy.    Issues for Follow Up:  1. Patient reports that he has been on prednisone daily for 8 years, and he feels that it is ruining his health. He continues to wheeze despite taking his current asthma regimen which includes prednisone. I encouraged him to schedule a follow-up appointment with his pulmonologist to discuss continued prednisone use as well as better asthma control.  2. Discharged with 11 PO doxy.   Significant Procedures: None  Significant Labs and Imaging:  D-dimer WNL (0.39) Lower extremity venous US - no DVT Uric acid - 2.8  Rheumatoid factor - Negative ANA - Negative Xray ankle - no bony abnormalities  Recent Labs Lab 11/08/14 1711 11/09/14 0857 11/14/14 0511  WBC 17.6* 10.9* 11.8*  HGB 12.9* 11.8* 12.4*  HCT 39.6 36.2* 38.6*  PLT 223 196 275    Recent Labs Lab 11/08/14 1711 11/12/14 1208  NA 136 139  K 4.3 4.1  CL 101 105  CO2 24 27  GLUCOSE 189* 284*  BUN 16 9  CREATININE 1.11 1.08  CALCIUM 9.6 9.2    Results/Tests Pending at Time of Discharge: None  Discharge Medications:    Medication List    TAKE these  medications        ADULT ASPIRIN EC LOW STRENGTH 81 MG EC tablet  Generic drug:  aspirin  Take 81 mg by mouth daily.     albuterol (2.5 MG/3ML) 0.083% nebulizer solution  Commonly known as:  PROVENTIL  Take 2.5 mg by nebulization every 4 (four) hours as needed for wheezing or shortness of breath.     albuterol 108 (90  BASE) MCG/ACT inhaler  Commonly known as:  PROVENTIL HFA;VENTOLIN HFA  Inhale 2 puffs into the lungs every 4 (four) hours as needed for wheezing or shortness of breath.     atorvastatin 40 MG tablet  Commonly known as:  LIPITOR  Take 1 tablet (40 mg total) by mouth at bedtime.     Calcium Carbonate-Vit D-Min 600-400 MG-UNIT Tabs  Take 1 tablet by mouth 2 (two) times daily.     clopidogrel 75 MG tablet  Commonly known as:  PLAVIX  Take 1 tablet (75 mg total) by mouth daily.     doxycycline 100 MG tablet  Commonly known as:  VIBRA-TABS  Take 1 tablet (100 mg total) by mouth 2 (two) times daily.     Fluticasone-Salmeterol 250-50 MCG/DOSE Aepb  Commonly known as:  ADVAIR  Inhale 1 puff into the lungs 2 (two) times daily.     metFORMIN 1000 MG tablet  Commonly known as:  GLUCOPHAGE  Take 1,000 mg by mouth 2 (two) times daily with a meal.     mometasone 50 MCG/ACT nasal spray  Commonly known as:  NASONEX  2 sprays in each nostril once daily     montelukast 10 MG tablet  Commonly known as:  SINGULAIR  Take 1 tablet (10 mg total) by mouth at bedtime.     multivitamin tablet  Take 1 tablet by mouth daily as needed (for supplementation).     predniSONE 10 MG tablet  Commonly known as:  DELTASONE  Take 4 for three days 3 for three days 2 for three days then one daily        Discharge Instructions: Please refer to Patient Instructions section of EMR for full details.  Patient was counseled important signs and symptoms that should prompt return to medical care, changes in medications, dietary instructions, activity restrictions, and follow up appointments.   Verner Mould, MD 11/14/2014, 1:09 PM PGY-1, [place]

## 2014-11-09 NOTE — Progress Notes (Signed)
[facility] PCP Visit - Progress Note  I have seen the patient and discussed current hospitalization. Mr. Schirmer is overall feeling much better today and is in good spirits. He admits to some persistent right LE erythema and edema (mostly around ankle) but it is improved. He denied any specific injury or break in the skin that triggered this. Discussed plan per primary team to continue antibiotics and work-up with dopplers.  Additionally, last time I saw him in office was 06/2014 for a follow-up of Left forearm/wrist spontaneous ecchymosis / hematoma. He questioned if this is similar to that episode, which was self-limited and resolved, but he did see Dr. Marin Olp (Heme/Onc) in 06/2014, thought that spontaneous hematoma due to combination of ASA, Plavix, and Prednisone. These episodes seem different as previously (pictures in chart) there were no signs of infection only ecchymosis. Current episode seems much more consistent with cellulitis.  Also reviewed his chronic asthma history, followed by Dr. Joya Gaskins [facility]), states last saw him over 6 months ago, continues on chronic prednisone but hopes to be tapered down to at least 5mg  daily if better controlled on maintenance inhalers. He understands this prednisone has many problematic side-effects including worsening his DM control, now with A1c 9.0. Advised him to follow-up with Dr. Joya Gaskins to re-discuss this prednisone plan.  I agree with the current hospital plan outlined by the primary team of [place] and want to thank them for their continued excellent care and efforts in caring for my patient. I will anticipate to follow-up with the patient in the clinic after discharge from [place], [place], PGY-3

## 2014-11-10 LAB — GLUCOSE, CAPILLARY
GLUCOSE-CAPILLARY: 187 mg/dL — AB (ref 65–99)
Glucose-Capillary: 101 mg/dL — ABNORMAL HIGH (ref 65–99)
Glucose-Capillary: 250 mg/dL — ABNORMAL HIGH (ref 65–99)
Glucose-Capillary: 159 mg/dL — ABNORMAL HIGH (ref 65–99)

## 2014-11-10 MED ORDER — PIPERACILLIN-TAZOBACTAM 3.375 G IVPB
3.3750 g | Freq: Three times a day (TID) | INTRAVENOUS | Status: DC
  Administered 2014-11-10 – 2014-11-12 (×7): 3.375 g via INTRAVENOUS
  Filled 2014-11-10 (×9): qty 50

## 2014-11-10 MED ORDER — VANCOMYCIN HCL IN DEXTROSE 750-5 MG/150ML-% IV SOLN
750.0000 mg | Freq: Two times a day (BID) | INTRAVENOUS | Status: DC
  Administered 2014-11-10 – 2014-11-12 (×5): 750 mg via INTRAVENOUS
  Filled 2014-11-10 (×8): qty 150

## 2014-11-10 NOTE — Progress Notes (Addendum)
ANTIBIOTIC CONSULT NOTE - INITIAL  Pharmacy Consult for Vancomycin and Zosyn Indication: RLE cellulitis   No Known Allergies  Patient Measurements: Height: 5\' 8"  (172.7 cm) Weight: 164 lb 1.6 oz (74.435 kg) IBW/kg (Calculated) : 68.4  Vital Signs: Temp: 98.5 F (36.9 C) (07/22 0522) Temp Source: Oral (07/22 0522) BP: 111/71 mmHg (07/22 0522) Pulse Rate: 80 (07/22 0522) Intake/Output from previous day: 07/21 0701 - 07/22 0700 In: 480 [P.O.:480] Out: -  Intake/Output from this shift: Total I/O In: 240 [P.O.:240] Out: -   Labs:  Recent Labs  11/08/14 1711 11/09/14 0857  WBC 17.6* 10.9*  HGB 12.9* 11.8*  PLT 223 196  CREATININE 1.11  --    Estimated Creatinine Clearance: 72.7 mL/min (by C-G formula based on Cr of 1.11). No results for input(s): VANCOTROUGH, VANCOPEAK, VANCORANDOM, GENTTROUGH, GENTPEAK, GENTRANDOM, TOBRATROUGH, TOBRAPEAK, TOBRARND, AMIKACINPEAK, AMIKACINTROU, AMIKACIN in the last 72 hours.   Microbiology: No results found for this or any previous visit (from the past 720 hour(s)).  Medical History: Past Medical History  Diagnosis Date  . Asthma   . Dental caries   . CAD (coronary artery disease)   . GERD (gastroesophageal reflux disease)   . Hyperlipidemia   . Asthma   . Thyroid nodule     biopsy negative 2006  . Myocardial infarction 2008  . Cellulitis of leg, right 10/2014  . Diabetes mellitus     TYPE 2    Medications:  Kelfex 7/21 >   Assessment: Pt with RLE cellulitis. WBC 10.9 / Tmax 98.5 / SCr stable at 1.1 No cultures  Goal of Therapy:  vanc through 10-15  Plan:  vanc 750mg  IV Q12 Monitor troughs as needed / CBC / Tmax / SCr  Meagan C. Lennox Grumbles, PharmD Pharmacy Resident  Pager: [phone]  Addendum: To add Zosyn for worsening cellulitis.  Pt also on PO Keflex > have paged MD to discontinue.  Start Zosyn 3.375 gm IV q8h (4 hour infusion).  [employer], Pharm.D., BCPS Clinical Pharmacist Pager  [phone] 11/10/2014 11:41 AM

## 2014-11-10 NOTE — Progress Notes (Signed)
Results for EGE, MUCKEY (MRN [id number]) as of 11/10/2014 13:41  Ref. Range 11/09/2014 12:30 11/09/2014 17:39 11/09/2014 22:03 11/10/2014 08:07 11/10/2014 12:13  Glucose-Capillary Latest Ref Range: 65-99 mg/dL 150 (H) 213 (H) 119 (H) 101 (H) 250 (H)  Noted that postprandial blood sugars greater than 180 mg/dl.  Recommend adding Novolog 3 units TID with meals if patient eats at least 50% of meal, while on steroids, and if CBGs continue to be greater than 180 mg/dl. Will continue to follow while in hospital. Harvel Ricks RN BSN CDE

## 2014-11-10 NOTE — Progress Notes (Addendum)
Referred to [facility] to establish patient's orange card reinstatement. On third attempt at contact, Neoma Laming referred me to [facility]. Attempted to contact twice and left meassge with receptionist at [facility] as I was unable to leave VM on her busy line. Waiting for call back about orange card so patient can pick it up and get his Rx filled at [facility] by the time they close today at 5:00. 13:00 Left VM with Eusebio Friendly [phone]) to call back about orange card. Stated patient is to be discharged today and will need to get card before the [facility] closes at 5:00 to fill his Rx.  15:30 Never received call back from [facility]. Called office and she answered phone. She stated that she received his application from his son yesterday, the usual turn around time in 24 hours, but she would not have time to get [employer] completed today, stated patient let card expire. CM reinforced that patient needs card to get medication. Pamala Hurry stated that she can fax his medication list to the [facility], and call them to ask that they fill his Rx after discharge without him having his card. CM informed Pamala Hurry that med rec is not complete and this is not the official list of medication that patient will be discharged on. MAR faxed to [place] at [phone]. Fax successful

## 2014-11-11 ENCOUNTER — Inpatient Hospital Stay (HOSPITAL_COMMUNITY): Payer: Medicaid Other

## 2014-11-11 DIAGNOSIS — R600 Localized edema: Secondary | ICD-10-CM

## 2014-11-11 LAB — URIC ACID: URIC ACID, SERUM: 2.3 mg/dL — AB (ref 4.4–7.6)

## 2014-11-11 LAB — GLUCOSE, CAPILLARY
GLUCOSE-CAPILLARY: 193 mg/dL — AB (ref 65–99)
GLUCOSE-CAPILLARY: 145 mg/dL — AB (ref 65–99)
Glucose-Capillary: 106 mg/dL — ABNORMAL HIGH (ref 65–99)
Glucose-Capillary: 151 mg/dL — ABNORMAL HIGH (ref 65–99)
Glucose-Capillary: 168 mg/dL — ABNORMAL HIGH (ref 65–99)

## 2014-11-11 MED ORDER — HYDROCODONE-ACETAMINOPHEN 5-325 MG PO TABS
2.0000 | ORAL_TABLET | Freq: Once | ORAL | Status: AC
  Administered 2014-11-11: 2 via ORAL
  Filled 2014-11-11: qty 2

## 2014-11-11 NOTE — Progress Notes (Signed)
Family Medicine Teaching Service Daily Progress Note Intern Pager: [phone]  Patient name: [name] Medical record number: [id number] Date of birth: 1960-07-12 Age: 54 y.o. Gender: male  Primary Care Provider: [name], DO Consultants: None Code Status: FULL  Pt Overview and Major Events to Date:  07/20: received second dose of ctx 07/21: transitioned to PO Keflex 07/22: d/c Keflex; began IV vanc and Zosyn  Assessment and Plan: [name] is a 54 y.o. male presenting with R leg pain, swelling and redness . PMH is significant for Type II DM, asthma, CAD, HLD, GERD.  R leg pain, swelling and redness: most likely due to cellulitis +/- DVT. No source of infection noted yet but patient is immunocompromised due to chronic prednisone use. Increased WBC in urgent care but may be increased due to chronic prednisone use. Denies fevers. Received one dose of ctx at urgent care day before admission. Negative Homan's sign and patient is on Plavix at home but gradually increasing calf pain and hx of CAD so need to rule out DVT. Wells score 3 (R calf swelling >3 cm larger than L; WBC 24K). D-dimer WNL at 0.39, Dopper showed no DVT.  - Norco for pain  - Doppler showed Baker's cyst on R but no DVT - Continue IV vanc/zosyn - ANA, RF, uric acid pending to r/u autoimmune process  - Ankle XR to r/u bony process  Asthma: taking prednisone for 8 years for maintenance - Continue home albuterol nebs q4 PRN - Continue home Dulera, Singulair, and prednisone 10 mg - Pt needs f/u with pulmonary provider to discuss chronic prednisone use, especially given his continued wheezing despite medication  Type II DM - Hold home metformin - SSI - CBG monitoring  HLD - Continue home Lipitor   CAD: coronary stent placed in 2008 - Continue home Plavix  FEN/GI: heart healthy diet Prophylaxis: Lovenox  Disposition: home  Subjective:  [name] reports that his pain has worsened again this  morning. He cannot walk now because the pain is so intense, still primarily at his lateral malleolus. He denies calf pain upon palpation or pain on moving his toes. He feels the swelling and redness have increased. He denies fevers, chills, SOB, chest pain, abdominal pain.   Objective: Temp:  [97.9 F (36.6 C)-98.9 F (37.2 C)] 97.9 F (36.6 C) (07/23 0648) Pulse Rate:  [81-108] 81 (07/23 0648) Resp:  [16-20] 20 (07/23 0648) BP: (116-134)/(72-76) 122/72 mmHg (07/23 0648) SpO2:  [96 %-99 %] 96 % (07/23 0737) Physical Exam: General: well-appearing male lying in bed in NAD  ENTM: MMM Cardiovascular: tachycardic, regular rhythm Respiratory: mild expiratory wheezing most prominent in RLL, good air movement bilateral Abdomen: soft, non-tender, non-distended, +BS Extremities: R foot increasingly erythematous, edema unchanged, primarily on dorsal surface; erythema across shin up to knees with ill-defined erythematous lesions primarily near knee; no tenderness on palpation of R calf laterally, severe tenderness on palpation of lateral malleolus/fibular head; skin taut across ankle; no pus/discharge present. No edema/erythema/tenderness of L leg. Negative Homan's sign.  Skin: large hyperpigmented callus on L lower leg (patient attributes to praying position) Neuro: A&Ox3, no focal deficits Psych: appropriate mood and affect  Laboratory:  Recent Labs Lab 11/08/14 1711 11/09/14 0857  WBC 17.6* 10.9*  HGB 12.9* 11.8*  HCT 39.6 36.2*  PLT 223 196    Recent Labs Lab 11/08/14 1711  NA 136  K 4.3  CL 101  CO2 24  BUN 16  CREATININE 1.11  CALCIUM 9.6  GLUCOSE  189*    Imaging/Diagnostic Tests: No results found.   Verner Mould, MD 11/11/2014, 11:34 AM PGY-1, [place] Intern pager: [phone], text pages welcome

## 2014-11-12 DIAGNOSIS — M25571 Pain in right ankle and joints of right foot: Secondary | ICD-10-CM

## 2014-11-12 DIAGNOSIS — M25579 Pain in unspecified ankle and joints of unspecified foot: Secondary | ICD-10-CM | POA: Insufficient documentation

## 2014-11-12 LAB — BASIC METABOLIC PANEL
Anion gap: 7 (ref 5–15)
BUN: 9 mg/dL (ref 6–20)
CO2: 27 mmol/L (ref 22–32)
Calcium: 9.2 mg/dL (ref 8.9–10.3)
Chloride: 105 mmol/L (ref 101–111)
Creatinine, Ser: 1.08 mg/dL (ref 0.61–1.24)
GFR calc non Af Amer: >60 mL/min (ref >60)
Glucose, Bld: 284 mg/dL — ABNORMAL HIGH (ref 65–99)
POTASSIUM: 4.1 mmol/L (ref 3.5–5.1)
Sodium: 139 mmol/L (ref 135–145)

## 2014-11-12 LAB — GLUCOSE, CAPILLARY
Glucose-Capillary: 100 mg/dL — ABNORMAL HIGH (ref 65–99)
Glucose-Capillary: 265 mg/dL — ABNORMAL HIGH (ref 65–99)
Glucose-Capillary: 172 mg/dL — ABNORMAL HIGH (ref 65–99)
Glucose-Capillary: 193 mg/dL — ABNORMAL HIGH (ref 65–99)

## 2014-11-12 LAB — VANCOMYCIN, TROUGH: Vancomycin Tr: 13 ug/mL (ref 10.0–20.0)

## 2014-11-12 LAB — RHEUMATOID FACTOR: Rhuematoid fact SerPl-aCnc: 10 IU/mL (ref 0.0–13.9)

## 2014-11-12 MED ORDER — VANCOMYCIN HCL IN DEXTROSE 750-5 MG/150ML-% IV SOLN
750.0000 mg | Freq: Two times a day (BID) | INTRAVENOUS | Status: DC
  Administered 2014-11-13: 750 mg via INTRAVENOUS
  Filled 2014-11-12 (×2): qty 150

## 2014-11-12 MED ORDER — PIPERACILLIN-TAZOBACTAM 3.375 G IVPB
3.3750 g | Freq: Three times a day (TID) | INTRAVENOUS | Status: DC
  Administered 2014-11-13 (×2): 3.375 g via INTRAVENOUS
  Filled 2014-11-12 (×3): qty 50

## 2014-11-12 NOTE — Progress Notes (Signed)
Family Medicine Teaching Service Daily Progress Note Intern Pager: [phone]  Patient name: [name] Medical record number: [id number] Date of birth: 04/14/1961 Age: 54 y.o. Gender: male  Primary Care Provider: [name], DO Consultants: None Code Status: FULL  Pt Overview and Major Events to Date:  07/20: received second dose of ctx 07/21: transitioned to PO Keflex 07/22: d/c Keflex; began IV vanc and Zosyn  Assessment and Plan: [name] is a 54 y.o. male presenting with R leg pain, swelling and redness . PMH is significant for Type II DM, asthma, CAD, HLD, GERD.  R leg pain, swelling and redness: most likely due to cellulitis. DVT not likely secondary to negative doppler. Improving on vanc/zosyn. Ankle x-ray negative for acute process. Uric acid negative.   - Norco for pain - Continue IV vanc/zosyn - ANA, RF, uric acid pending to r/u autoimmune process   Asthma: taking prednisone for 8 years for maintenance - Continue home albuterol nebs q4 PRN - Continue home Dulera, Singulair, and prednisone 10 mg - Pt needs f/u with pulmonary provider to discuss chronic prednisone use, especially given his continued wheezing despite medication  Type II DM: Fasting CBG of 100. Received 2u of aspart - Hold home metformin - SSI - CBG monitoring  HLD - Continue home Lipitor   CAD: coronary stent placed in 2008 - Continue home Plavix  FEN/GI: heart healthy diet Prophylaxis: Lovenox  Disposition: home pending transition to oral antibiotics  Subjective:  Pain improved and overall appearance improved. No nausea or vomiting overnight. Patient hoping to go soon.  Objective: Temp:  [97.5 F (36.4 C)-98.3 F (36.8 C)] 97.5 F (36.4 C) (07/24 0438) Pulse Rate:  [68-84] 68 (07/24 0438) Resp:  [18] 18 (07/24 0438) BP: (106-113)/(60-69) 113/68 mmHg (07/24 0438) SpO2:  [96 %-99 %] 98 % (07/24 0438)  Physical Exam: General: well-appearing male lying in bed in NAD   ENTM: MMM Cardiovascular: tachycardic, regular rhythm Respiratory: mild expiratory wheezing most prominent in RLL, good air movement bilateral Abdomen: soft, non-tender, non-distended, +BS Extremities: Erythema around lateral, distal end of right leg and ankle and shows improvement from prior margins. Mild tenderness and warmth over lateral malleolus of right ankle. Neuro: A&Ox3, no focal deficits Psych: appropriate mood and affect      Laboratory:  Recent Labs Lab 11/08/14 1711 11/09/14 0857  WBC 17.6* 10.9*  HGB 12.9* 11.8*  HCT 39.6 36.2*  PLT 223 196    Recent Labs Lab 11/08/14 1711  NA 136  K 4.3  CL 101  CO2 24  BUN 16  CREATININE 1.11  CALCIUM 9.6  GLUCOSE 189*    Imaging/Diagnostic Tests: Dg Ankle 2 Views Right  11/11/2014   CLINICAL DATA:  Five day history of right lateral ankle pain, swelling and erythema. No known injury.  EXAM: RIGHT ANKLE - 2 VIEW  COMPARISON:  None.  FINDINGS: No evidence of acute or subacute fracture or dislocation. Ankle mortise intact with well preserved joint space. Small accessory ossicle dorsal to the talus. Very small plantar calcaneal spur. Lateral soft tissue swelling.  IMPRESSION: No acute or significant osseous abnormality.   Electronically Signed   By: Evangeline Mills M.D.   On: 11/11/2014 16:44     Mariel Aloe, MD 11/12/2014, 8:27 AM PGY-3, [place] Intern pager: [phone], text pages welcome

## 2014-11-12 NOTE — Progress Notes (Addendum)
ANTIBIOTIC CONSULT NOTE - Follow-Up  Pharmacy Consult for Vancomycin and Zosyn Indication: RLE cellulitis   No Known Allergies  Patient Measurements: Height: 5\' 8"  (172.7 cm) Weight: 164 lb 1.6 oz (74.435 kg) IBW/kg (Calculated) : 68.4  Vital Signs: Temp: 97.5 F (36.4 C) (07/24 0438) Temp Source: Oral (07/24 0438) BP: 113/68 mmHg (07/24 0438) Pulse Rate: 68 (07/24 0438) Intake/Output from previous day: 07/23 0701 - 07/24 0700 In: 60 [P.O.:60] Out: -  Intake/Output from this shift:    Labs:  Recent Labs  11/09/14 0857  WBC 10.9*  HGB 11.8*  PLT 196   Estimated Creatinine Clearance: 72.7 mL/min (by C-G formula based on Cr of 1.11). No results for input(s): VANCOTROUGH, VANCOPEAK, VANCORANDOM, GENTTROUGH, GENTPEAK, GENTRANDOM, TOBRATROUGH, TOBRAPEAK, TOBRARND, AMIKACINPEAK, AMIKACINTROU, AMIKACIN in the last 72 hours.   Microbiology: No results found for this or any previous visit (from the past 720 hour(s)).  Medical History: Past Medical History  Diagnosis Date  . Asthma   . Dental caries   . CAD (coronary artery disease)   . GERD (gastroesophageal reflux disease)   . Hyperlipidemia   . Asthma   . Thyroid nodule     biopsy negative 2006  . Myocardial infarction 2008  . Cellulitis of leg, right 10/2014  . Diabetes mellitus     TYPE 2    Assessment: Pt with RLE cellulitis continues on Vancomycin d#3. Vanc trough (7/24) = 13 WBC 10.9 / Tmax 98.5 / SCr stable at 1.08 No cultures  Goal of Therapy:  Vanc through 10-15  Plan:  Continue Vancomycin 750mg  IV q12h  Continue Zosyn 3.375 gm IV q8h (4 hour infusion). Monitor troughs as needed / CBC / Tmax / SCr  Meagan C. Lennox Grumbles, PharmD Pharmacy Resident   Pager: [phone] 11/12/2014 7:42 AM

## 2014-11-13 LAB — GLUCOSE, CAPILLARY
Glucose-Capillary: 83 mg/dL (ref 65–99)
Glucose-Capillary: 217 mg/dL — ABNORMAL HIGH (ref 65–99)
Glucose-Capillary: 197 mg/dL — ABNORMAL HIGH (ref 65–99)
Glucose-Capillary: 120 mg/dL — ABNORMAL HIGH (ref 65–99)

## 2014-11-13 LAB — ANTINUCLEAR ANTIBODIES, IFA: ANA Ab, IFA: NEGATIVE

## 2014-11-13 MED ORDER — DOXYCYCLINE HYCLATE 100 MG PO TABS
100.0000 mg | ORAL_TABLET | Freq: Two times a day (BID) | ORAL | Status: DC
  Administered 2014-11-13 – 2014-11-14 (×3): 100 mg via ORAL
  Filled 2014-11-13 (×4): qty 1

## 2014-11-13 NOTE — Progress Notes (Signed)
Family Medicine Teaching Service Daily Progress Note Intern Pager: [phone]  Patient name: [name] Medical record number: [id number] Date of birth: April 29, 1960 Age: 54 y.o. Gender: male  Primary Care Provider: [name], DO Consultants: None Code Status: FULL  Pt Overview and Major Events to Date:  07/20: received second dose of ctx 07/21: transitioned to PO Keflex 07/22: d/c Keflex; began IV vanc and Zosyn 07/25: d/c vanc and zosyn; transition to PO doxy  Assessment and Plan: [name] is a 54 y.o. male presenting with R leg pain, swelling and redness . PMH is significant for Type II DM, asthma, CAD, HLD, GERD.  R leg pain, swelling and redness: most likely due to cellulitis. No source of infection noted yet but patient is immunocompromised due to chronic prednisone use. Increased WBC in urgent care but may be increased due to chronic prednisone use. Denies fevers. Received one dose of ctx at urgent care day before admission. D-dimer WNL at 0.39, Doppler showed no DVT. Xray showed no bony abnormality.   - Norco for pain  - RF negative, uric acid not elevated; ANA pending - Transition to PO doxy today; watch for 24 hours - D/c calcium supplement while on doxy   Asthma: taking prednisone for 8 years for maintenance - Continue home albuterol nebs q4 PRN - Continue home Dulera, Singulair, and prednisone 10 mg - Pt needs f/u with pulmonary provider to discuss chronic prednisone use, especially given his continued wheezing despite medication  Type II DM - Hold home metformin - SSI - CBG monitoring  HLD - Continue home Lipitor   CAD: coronary stent placed in 2008 - Continue home Plavix  FEN/GI: heart healthy diet Prophylaxis: Lovenox  Disposition: home  Subjective:  [name] pain is much improved this morning. The erythema has receded behind the original outline drawn at urgent care. He reports that he can walk now without pain and generally feels  much better.  Objective: Temp:  [97.7 F (36.5 C)-99.1 F (37.3 C)] 99.1 F (37.3 C) (07/25 1417) Pulse Rate:  [64-77] 77 (07/25 1417) Resp:  [16-20] 20 (07/25 1417) BP: (109-123)/(57-77) 123/68 mmHg (07/25 1417) SpO2:  [97 %-98 %] 98 % (07/25 1417) Physical Exam: General: well-appearing male lying in bed in NAD  Cardiovascular: tachycardic, regular rhythm Respiratory: CTAB, no wheezes Abdomen: soft, non-tender, non-distended, +BS Extremities: R foot slightly erythematous, non-edematous; no tenderness on palpation of R calf laterally, very mild tenderness on palpation of lateral malleolus/fibular head; no pus/discharge present. No edema/erythema/tenderness of L leg Skin: large hyperpigmented callus on L lower leg (patient attributes to praying position) Neuro: A&Ox3, no focal deficits Psych: appropriate mood and affect  Laboratory:  Recent Labs Lab 11/08/14 1711 11/09/14 0857  WBC 17.6* 10.9*  HGB 12.9* 11.8*  HCT 39.6 36.2*  PLT 223 196    Recent Labs Lab 11/08/14 1711 11/12/14 1208  NA 136 139  K 4.3 4.1  CL 101 105  CO2 24 27  BUN 16 9  CREATININE 1.11 1.08  CALCIUM 9.6 9.2  GLUCOSE 189* 284*    Imaging/Diagnostic Tests: Dg Ankle 2 Views Right  11/11/2014   CLINICAL DATA:  Five day history of right lateral ankle pain, swelling and erythema. No known injury.  EXAM: RIGHT ANKLE - 2 VIEW  COMPARISON:  None.  FINDINGS: No evidence of acute or subacute fracture or dislocation. Ankle mortise intact with well preserved joint space. Small accessory ossicle dorsal to the talus. Very small plantar calcaneal spur. Lateral soft tissue swelling.  IMPRESSION: No acute or significant osseous abnormality.   Electronically Signed   By: Evangeline Dakin M.D.   On: 11/11/2014 16:44     [place], MD 11/13/2014, 4:17 PM PGY-1, [place] Intern pager: [phone], text pages welcome

## 2014-11-14 LAB — CBC
HCT: 38.6 % — ABNORMAL LOW (ref 39.0–52.0)
Hemoglobin: 12.4 g/dL — ABNORMAL LOW (ref 13.0–17.0)
MCH: 23.6 pg — ABNORMAL LOW (ref 26.0–34.0)
MCHC: 32.1 g/dL (ref 30.0–36.0)
MCV: 73.4 fL — AB (ref 78.0–100.0)
PLATELETS: 275 10*3/uL (ref 150–400)
RBC: 5.26 MIL/uL (ref 4.22–5.81)
RDW: 15.6 % — AB (ref 11.5–15.5)
WBC: 11.8 10*3/uL — AB (ref 4.0–10.5)

## 2014-11-14 LAB — GLUCOSE, CAPILLARY
Glucose-Capillary: 95 mg/dL (ref 65–99)
Glucose-Capillary: 175 mg/dL — ABNORMAL HIGH (ref 65–99)

## 2014-11-14 MED ORDER — DOXYCYCLINE HYCLATE 100 MG PO TABS: 100.0000 mg | ORAL_TABLET | Freq: Two times a day (BID) | ORAL | Status: DC

## 2014-11-14 NOTE — Progress Notes (Signed)
Utilization Review completed. Nikiya Starn RN BSN CM 

## 2014-11-14 NOTE — Progress Notes (Signed)
Patient was discharged home by MD order; discharged instructions  review and give to patient with care notes and prescriptions; IV DIC; patient will be escorted to the car by nurse tech via wheelchair.  

## 2014-11-14 NOTE — Discharge Instructions (Signed)
You were hospitalized for cellulitis in your right leg. Please continue to take your antibiotics (doxycycline) for 11 more days.   It is important to schedule up with your pulmonologist to follow-up on your asthma treatment regimen, since you are still having wheezing despite taking your medications.

## 2014-11-14 NOTE — Care Management Note (Signed)
Case Management Note  Patient Details  Name: [name] MRN: [id number] Date of Birth: 09/03/1960  Subjective/Objective:          Patient is for dc today, NCM contacted the [facility] , they have 4 medicatios waiting for patient for pick up at dc, metformin, htcz, linsipril and cephalexin.  Patient is aware that he needs to go to [place] to pick up medications at dc.          Action/Plan:   Expected Discharge Date:                  Expected Discharge Plan:  Home/Self Care  In-House Referral:     Discharge planning Services  CM Consult  Post Acute Care Choice:    Choice offered to:     DME Arranged:    DME Agency:     HH Arranged:    [place] Agency:     Status of Service:  Completed, signed off  Medicare Important Message Given:    Date Medicare IM Given:    Medicare IM give by:    Date Additional Medicare IM Given:    Additional Medicare Important Message give by:     If discussed at [place] of Stay Meetings, dates discussed:    Additional Comments:  Zenon Mayo, RN 11/14/2014, 12:25 PM

## 2014-11-20 ENCOUNTER — Ambulatory Visit (INDEPENDENT_AMBULATORY_CARE_PROVIDER_SITE_OTHER): Payer: Self-pay | Admitting: Family Medicine

## 2014-11-20 ENCOUNTER — Encounter: Payer: Self-pay | Admitting: Family Medicine

## 2014-11-20 VITALS — BP 115/73 | HR 89 | Temp 98.9°F | Ht 68.0 in | Wt 153.0 lb

## 2014-11-20 DIAGNOSIS — L03115 Cellulitis of right lower limb: Secondary | ICD-10-CM

## 2014-11-20 NOTE — Assessment & Plan Note (Signed)
Resolved, s/p hospitalization 7/20 to 7/26 on IV CTX > IV Vanc / Zosyn > PO Doxycycline. No abscess formation or other complications. Likely staph component given response to Vanc and Doxy  Plan: 1. Complete Doxycycline course, about 3 days left. No further antibiotics 2. May take Tylenol, Ibuprofen PRN, elevate if swelling 3. Return to work

## 2014-11-20 NOTE — Patient Instructions (Signed)
Dear [name], Thank you for coming in to clinic today. It was good to see you!  1. You are doing well after hospital stay. Your Leg looks much better. Sounds like infection is resolved. 2. Finish Doxycycline as prescribed. No further antibiotics. 3. May continue Tylenol or Ibuprofen as needed for pain. 4. If swelling after standing, may elevate and use ice packs  Please schedule apt with Pulmonologist to discuss chronic Prednisone use, consider alternatives.  Please schedule a follow-up appointment with Dr. Parks Ranger in 3 to 6 months for routine follow-up  If you have any other questions or concerns, please feel free to call the clinic to contact me. You may also schedule an earlier appointment if necessary.  However, if your symptoms get significantly worse, please go to the Emergency Department to seek immediate medical attention.  [name], [place]

## 2014-11-20 NOTE — Progress Notes (Signed)
   Subjective:    Patient ID: [name], male    DOB: Jun 18, 1960, 54 y.o.   MRN: [id number]  HPI  Hospital follow-up from 11/08/14 to 11/14/14 RLE CELLULITIS: - Initially presented to [facility] on 7/20 for RLE pain / swelling, dx with acute cellulitis. Hospitalized and received IV antibiotics with IV Ceftriaxone, for 3 days, coverage broadened to IV Vanc / Zosyn after 48 hours with some worsening. Overall significant improvement with staph coverage. Additional labs ordered with RF (negative), ANA (negative), and Uric Acid (below normal range). Transitioned to PO Doxycycline and discharged with total 14 day antibiotic course. - Today reports he is feeling a lot better. Tolerating Doxycycline, has 3 more days left. No return of symptoms or other concerns. 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 - Does admit to a little decreased appetite since starting Doxycycline. But tolerating PO well. - Denies any fevers/chills, n/v, abdominal pain, swelling, erythema, CP, SOB  PMH: - Asthma, chronic persistent - Followed by Dr. Joya Gaskins [facility]), remains on chronic prednisone >8 years, last apt 6 months ago. Tapered down to 5mg  daily. Previous concerns with worsening DM control. In hospital he was advised to f/u with Dr. Tami Ribas, has yet to schedule this apt  I have reviewed and updated the following as appropriate: allergies and current medications  Social Hx: - Working at Stat[employer], requesting to go back to work - Former smoker  Review of Systems  See above HPI    Objective:   Physical Exam  BP 115/73 mmHg  Pulse 89  Temp(Src) 98.9 F (37.2 C) (Oral)  Ht 5\' 8"  (1.727 m)  Wt 153 lb (69.4 kg)  BMI 23.27 kg/m2  Gen - well-appearing, pleasant, comfortable, NAD HEENT - MMM Heart - RRR, no murmurs heard Lungs - CTAB, no wheezing, crackles, or rhonchi. Normal work of breathing. Ext - Resolved RLE cellulitis. Non-tender, no edema, peripheral pulses intact +2 b/l Skin -  warm, dry, no rashes            Assessment & Plan:   See specific A&P problem list for details.

## 2014-11-29 ENCOUNTER — Telehealth: Payer: Self-pay | Admitting: Critical Care Medicine

## 2014-11-29 NOTE — Telephone Encounter (Signed)
Received refill request from [facility] Dept to refill pt's Advair 250/50 Pt last seen in office 10/2013 and currently has no f/u appt with provider Pt called and message was left for pt to call office to schedule an appt with a provider in the office so that we may process his refill request Phone # for [facility] Dept is [phone]  Fax # for [facility] Dept is [phone]  Waiting on return call from pt

## 2014-12-08 ENCOUNTER — Ambulatory Visit (INDEPENDENT_AMBULATORY_CARE_PROVIDER_SITE_OTHER): Payer: Self-pay | Admitting: Family Medicine

## 2014-12-08 VITALS — BP 119/73 | HR 103 | Temp 98.3°F | Wt 157.6 lb

## 2014-12-08 DIAGNOSIS — L03115 Cellulitis of right lower limb: Secondary | ICD-10-CM

## 2014-12-08 MED ORDER — CIPROFLOXACIN HCL 750 MG PO TABS: 750.0000 mg | ORAL_TABLET | Freq: Two times a day (BID) | ORAL | Status: DC

## 2014-12-08 MED ORDER — SULFAMETHOXAZOLE-TRIMETHOPRIM 800-160 MG PO TABS: 1.0000 | ORAL_TABLET | Freq: Two times a day (BID) | ORAL | Status: DC

## 2014-12-08 NOTE — Patient Instructions (Signed)
Thanks for coming in today.   It appears that you have a recurrence of the infection in your right leg.    We will start you on two antibiotics that you will take for 14 days total.   Be sure to take the antibiotics every day as prescribed.   Return to clinic for follow up in 3-4 days to make sure that the infection is improving.   If your symptoms become much worse in the meantime, then be sure to go to the ED or call the after hours line to discuss with the physician on call.    Thanks for letting us take care of you!  Sincerely,  Paula Compton, MD Family Medicine - PGY 2

## 2014-12-11 ENCOUNTER — Telehealth: Payer: Self-pay | Admitting: Family Medicine

## 2014-12-11 NOTE — Telephone Encounter (Signed)
Daughter calling to say that [name] is experiencing nausea, weakness and decreased appetite from the sulfamethoxazole-trimethoprim and the ciprofloaxin.  Please call and discuss other med that patient might be given.

## 2014-12-11 NOTE — Progress Notes (Signed)
Patient ID: [name], male   DOB: May 11, 1960, 54 y.o.   MRN: [id number]   [name] Theodore Hacker, MD Phone: [phone]  Subjective:   # Cellulitis of RLE  - this is a problem that he has had in the past.  - pt. Here for same day appointment with worsening cellulitis of his rle.  - he has uncontrolled DMII, and had an episode of cellulitis of his RUE many months ago that resolved with outpatient treatment, then in July, he had an episode of RLE cellulitis that failed outpatient treatment and required admission for IV antibiotics. He rapidly improved on IV antibiotics, and was discharged on a course of doxycycline which subsequently cleared up the infection.  - The infection completely cleared up 2 weeks ago, however he says that 4 days ago, he began experiencing worsening redness, swelling, and pain in the same area that the infection was in before.  - he says that he has not had fever, chills, nausea, or vomiting, but that the infection looks similar to the infection that he experienced previously but not as bad. He wanted to come in and get it treated before it got too bad.  - he says that he has not had any injury to the area. He is able to walk on it though it is slightly painful He says the pain is not as bad as it was previously.  - No focal bony tenderness.  - No drainage, and no skin breakdown, though areas of peeling skin are noticeable with margins consistent with his previous infection.  - He completed the entire course of antibiotics previously, and he has not been around anyone with any infections.      All relevant systems were reviewed and were negative unless otherwise noted in the HPI  Past Medical History Reviewed problem list.  Medications- reviewed and updated Current Outpatient Prescriptions  Medication Sig Dispense Refill  . albuterol (PROVENTIL HFA;VENTOLIN HFA) 108 (90 BASE) MCG/ACT inhaler Inhale 2 puffs into the lungs every 4 (four)  hours as needed for wheezing or shortness of breath. 1 Inhaler 3  . albuterol (PROVENTIL) (2.5 MG/3ML) 0.083% nebulizer solution Take 2.5 mg by nebulization every 4 (four) hours as needed for wheezing or shortness of breath.    Marland Kitchen aspirin (ADULT ASPIRIN EC LOW STRENGTH) 81 MG EC tablet Take 81 mg by mouth daily.     Marland Kitchen atorvastatin (LIPITOR) 40 MG tablet Take 1 tablet (40 mg total) by mouth at bedtime. 30 tablet 11  . Calcium Carbonate-Vit D-Min 600-400 MG-UNIT TABS Take 1 tablet by mouth 2 (two) times daily.     . ciprofloxacin (CIPRO) 750 MG tablet Take 1 tablet (750 mg total) by mouth 2 (two) times daily. 28 tablet 0  . clopidogrel (PLAVIX) 75 MG tablet Take 1 tablet (75 mg total) by mouth daily. 30 tablet 11  . doxycycline (VIBRA-TABS) 100 MG tablet Take 1 tablet (100 mg total) by mouth 2 (two) times daily. 22 tablet 0  . Fluticasone-Salmeterol (ADVAIR) 250-50 MCG/DOSE AEPB Inhale 1 puff into the lungs 2 (two) times daily. 1 each 0  . metFORMIN (GLUCOPHAGE) 1000 MG tablet Take 1,000 mg by mouth 2 (two) times daily with a meal.    . mometasone (NASONEX) 50 MCG/ACT nasal spray 2 sprays in each nostril once daily (Patient taking differently: Place 2 sprays into the nose daily as needed (for allergies). ) 17 g 6  . montelukast (SINGULAIR) 10 MG tablet Take 1 tablet (  10 mg total) by mouth at bedtime. 30 tablet 11  . Multiple Vitamin (MULTIVITAMIN) tablet Take 1 tablet by mouth daily as needed (for supplementation).    . predniSONE (DELTASONE) 10 MG tablet Take 4 for three days 3 for three days 2 for three days then one daily (Patient taking differently: Take 10 mg by mouth daily with breakfast. ) 30 tablet 11  . sulfamethoxazole-trimethoprim (BACTRIM DS,SEPTRA DS) 800-160 MG per tablet Take 1 tablet by mouth 2 (two) times daily. 28 tablet 0   No current facility-administered medications for this visit.   Chief complaint-noted No additions to family history Social history- patient is a non  smoker  Objective: BP 119/73 mmHg  Pulse 103  Temp(Src) 98.3 F (36.8 C) (Oral)  Wt 157 lb 9.6 oz (71.487 kg) Gen: NAD, alert, cooperative with exam HEENT: NCAT, EOMI, PERRL Neck: FROM, supple CV: RRR, good S1/S2, no murmur Resp: CTABL, no wheezes, non-labored Abd: SNTND, BS present, no guarding or organomegaly Ext: RLE with 8x10 cm area of cellulitis with erythema, trace edema visible. No drainage, no skin damage / breakdown, no areas of trauma noted. 2+ DP, and PT pulses. No sensory deficits. Mild TTP without focal bony TTP. LLE without evidence of infection or skin breakdown.  Neuro: Alert and oriented, No gross deficits Skin: no rashes no lesions  Assessment/Plan:   # RLE cellulitis -  This is a recurrent problem for him. Little concern for osteomyelitis based on exam and history at this point. Likely just a recurrent infection of the same previously infected area. Poor control of DMII makes him more susceptible. No systemic signs or symptoms at this point. Discussed with Dr. Mingo Amber - plan to start bactrim and cipro for extended coverage.  - 14 day course - if worsening pain or systemic symptoms then go to the ED or urgent care - Will have him come back in 4-5 days to make sure that it is clearing up. If worsening then he will need admission with IV antibiotic therapy. If failing outpatient therapy would get an ESR to evaluate for osteomyelitis.  - Discussed with patient and return precuautions reviewed. Hopefully we will avoid another hospitalization for him.   Paula Compton, MD Family Medicine PGY - 2

## 2014-12-12 ENCOUNTER — Ambulatory Visit (INDEPENDENT_AMBULATORY_CARE_PROVIDER_SITE_OTHER): Payer: Self-pay | Admitting: Family Medicine

## 2014-12-12 ENCOUNTER — Encounter: Payer: Self-pay | Admitting: Family Medicine

## 2014-12-12 VITALS — BP 110/65 | HR 97 | Temp 98.1°F | Ht 68.0 in | Wt 155.6 lb

## 2014-12-12 DIAGNOSIS — L03115 Cellulitis of right lower limb: Secondary | ICD-10-CM

## 2014-12-12 NOTE — Telephone Encounter (Signed)
Discussed during our office visit today. See visit note thanks.   CGM MD

## 2014-12-12 NOTE — Progress Notes (Signed)
Patient ID: [name], male   DOB: 07-21-1960, 54 y.o.   MRN: [id number]   [facility] Theodore Hacker, MD Phone: [phone]  Subjective:   # Right Lower Extremity Cellulitis - Follow up  - Pt. Was seen here 5 days ago by myself for right lower extremity cellulitis. He has had an episode requiring hospitalization in 10/2014.  - He has been taking his antibiotic since - No fevers, his infection appears to be improving - He says that it looks much better - However, he has been having some fatigue after starting the Ciprofloxacin and Bactrim along with some episodes of diarrhea as well.  - He does not have abdominal pain, blood in his stool, and he does not have nausea.  - He says that he feels that he is having some side effects from the antibiotic, but otherwise he is trying to keep taking them because his infection is improving.  - He says the tenderness is much improved.  - He feels that he is easily fatigued.  - He has been checking his blood sugar and it is between 120-140 normally, and now 150's while on the antibiotic / having the infection.  - He says he wants to stay on the antibiotic for at least a 10 day course given that it is working.  - He will follow up in clinic in one week.  - He will come back sooner if he has any more issues.   All relevant systems were reviewed and were negative unless otherwise noted in the HPI  Past Medical History Reviewed problem list.  Medications- reviewed and updated Current Outpatient Prescriptions  Medication Sig Dispense Refill  . albuterol (PROVENTIL HFA;VENTOLIN HFA) 108 (90 BASE) MCG/ACT inhaler Inhale 2 puffs into the lungs every 4 (four) hours as needed for wheezing or shortness of breath. 1 Inhaler 3  . albuterol (PROVENTIL) (2.5 MG/3ML) 0.083% nebulizer solution Take 2.5 mg by nebulization every 4 (four) hours as needed for wheezing or shortness of breath.    Marland Kitchen aspirin (ADULT ASPIRIN EC LOW STRENGTH) 81 MG  EC tablet Take 81 mg by mouth daily.     Marland Kitchen atorvastatin (LIPITOR) 40 MG tablet Take 1 tablet (40 mg total) by mouth at bedtime. 30 tablet 11  . Calcium Carbonate-Vit D-Min 600-400 MG-UNIT TABS Take 1 tablet by mouth 2 (two) times daily.     . ciprofloxacin (CIPRO) 750 MG tablet Take 1 tablet (750 mg total) by mouth 2 (two) times daily. 28 tablet 0  . clopidogrel (PLAVIX) 75 MG tablet Take 1 tablet (75 mg total) by mouth daily. 30 tablet 11  . doxycycline (VIBRA-TABS) 100 MG tablet Take 1 tablet (100 mg total) by mouth 2 (two) times daily. 22 tablet 0  . Fluticasone-Salmeterol (ADVAIR) 250-50 MCG/DOSE AEPB Inhale 1 puff into the lungs 2 (two) times daily. 1 each 0  . metFORMIN (GLUCOPHAGE) 1000 MG tablet Take 1,000 mg by mouth 2 (two) times daily with a meal.    . mometasone (NASONEX) 50 MCG/ACT nasal spray 2 sprays in each nostril once daily (Patient taking differently: Place 2 sprays into the nose daily as needed (for allergies). ) 17 g 6  . montelukast (SINGULAIR) 10 MG tablet Take 1 tablet (10 mg total) by mouth at bedtime. 30 tablet 11  . Multiple Vitamin (MULTIVITAMIN) tablet Take 1 tablet by mouth daily as needed (for supplementation).    . predniSONE (DELTASONE) 10 MG tablet Take 4 for three days 3 for  three days 2 for three days then one daily (Patient taking differently: Take 10 mg by mouth daily with breakfast. ) 30 tablet 11  . sulfamethoxazole-trimethoprim (BACTRIM DS,SEPTRA DS) 800-160 MG per tablet Take 1 tablet by mouth 2 (two) times daily. 28 tablet 0   No current facility-administered medications for this visit.   Chief complaint-noted No additions to family history Social history- patient is a non smoker  Objective: BP 110/65 mmHg  Pulse 97  Temp(Src) 98.1 F (36.7 C) (Oral)  Ht 5\' 8"  (1.727 m)  Wt 155 lb 9.6 oz (70.58 kg)  BMI 23.66 kg/m2 Gen: NAD, alert, cooperative with exam HEENT: NCAT, EOMI, PERRL Neck: FROM, supple CV: RRR, good S1/S2, no murmur Resp: CTABL, no  wheezes, non-labored Abd: SNTND, BS present, no guarding or organomegaly Ext: , warm, normal tone, moves UE/LE spontaneously, RLE with much improved area of cellulitis to my exam, no discharge, no drainage, no skin breakdown, no bony tenderness.  Neuro: Alert and oriented, No gross deficits Skin: no rashes no lesions, see above description of healing cellulitis.   Assessment/Plan: See problem based a/p

## 2014-12-12 NOTE — Patient Instructions (Signed)
Thanks for coming in today.   You may drink high glucose liquids i.e. Coke, Gatorade to help get your energy level up.   Also, eat foods that make you feel better if possible.   You may also start a probiotic after you finish the antibiotic course.   You need to try to stay on the antibiotic until you've completed at least 10 days.   Come back in one week for follow up to make sure that things are going better.   Thanks for letting us take care of you!  Sincerely,  Paula Compton, MD Family Medicine - PGY 2

## 2014-12-12 NOTE — Assessment & Plan Note (Signed)
Pt. Seen by me 5 days ago. His infection is improving. He does however, has diarrhea and fatigue from the antibiotic. He does not have any abdominal pain or symptoms consistent with C.Diff at this point. He wants to stay on the antibiotic for five more days. I have explained the risks of the antibiotic vs. The benefits of treating the infection and avoiding a hospitalization. He would like to stay on the antibiotic.  - will see him back in 7 days for follow up.  - Stay on the antibiotic for 5 more days 10 days total. (prescription was written for 14 day course).  - If he develops any frank abdominal pain, nausea, or bloody / profusely watery diarrhea, then return to clinic for evaluation.  - start a probiotic after he finishes the antibiotic course.  - Continue to keep leg elevated as able.

## 2014-12-18 ENCOUNTER — Encounter: Payer: Self-pay | Admitting: Internal Medicine

## 2014-12-18 ENCOUNTER — Ambulatory Visit (INDEPENDENT_AMBULATORY_CARE_PROVIDER_SITE_OTHER): Payer: No Typology Code available for payment source | Admitting: Internal Medicine

## 2014-12-18 VITALS — BP 106/60 | HR 99 | Ht 68.0 in | Wt 155.8 lb

## 2014-12-18 DIAGNOSIS — J449 Chronic obstructive pulmonary disease, unspecified: Secondary | ICD-10-CM

## 2014-12-18 DIAGNOSIS — J4489 Other specified chronic obstructive pulmonary disease: Secondary | ICD-10-CM | POA: Insufficient documentation

## 2014-12-18 MED ORDER — MOMETASONE FURO-FORMOTEROL FUM 200-5 MCG/ACT IN AERO: INHALATION_SPRAY | RESPIRATORY_TRACT | Status: DC

## 2014-12-18 MED ORDER — PREDNISONE 5 MG (21) PO TBPK: 5.0000 mg | ORAL_TABLET | Freq: Every day | ORAL | Status: DC
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
## 2014-12-18 NOTE — Patient Instructions (Signed)
Stop advair  Start dulera 200 Take 2 puffs first thing in am and then another 2 puffs about 12 hours later.   Reduce prednisone to 5 mg daily   Please schedule a follow up office visit in 6 weeks, call sooner if needed

## 2014-12-19 ENCOUNTER — Encounter: Payer: Self-pay | Admitting: Internal Medicine

## 2014-12-19 NOTE — Assessment & Plan Note (Signed)
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   May however be able take advantage of the fact that he has fixed airflow obstruction and actually reduce his amount of systemic steroids to a more physiologic level.  The proper method of use, as well as anticipated side effects, of a metered-dose inhaler are discussed and demonstrated to the patient. Improved effectiveness after extensive coaching during this visit to a level of approximately  90% so try dulera 200 2bid   I had an extended discussion with the patient reviewing all relevant studies completed to date and  lasting 15 to 20 minutes of a 25 minute visit    Each maintenance medication was reviewed in detail including most importantly the difference between maintenance and prns and under what circumstances the prns are to be triggered using an action plan format that is not reflected in the computer generated alphabetically organized AVS.    Please see instructions for details which were reviewed in writing and the patient given a copy highlighting the part that I personally wrote and discussed at today's ov.

## 2015-01-09 ENCOUNTER — Other Ambulatory Visit: Payer: Self-pay | Admitting: *Deleted

## 2015-01-09 MED ORDER — ALBUTEROL SULFATE HFA 108 (90 BASE) MCG/ACT IN AERS: 2.0000 | INHALATION_SPRAY | RESPIRATORY_TRACT | Status: DC | PRN

## 2015-01-09 NOTE — Telephone Encounter (Signed)
Received faxed refill request from [facility] for proventil hfa.   Rx approved and faxed back to pharm.

## 2015-01-22 DIAGNOSIS — R600 Localized edema: Secondary | ICD-10-CM | POA: Insufficient documentation

## 2015-01-29 ENCOUNTER — Ambulatory Visit (INDEPENDENT_AMBULATORY_CARE_PROVIDER_SITE_OTHER): Payer: No Typology Code available for payment source | Admitting: Internal Medicine

## 2015-01-29 ENCOUNTER — Encounter: Payer: Self-pay | Admitting: Internal Medicine

## 2015-01-29 VITALS — BP 120/80 | HR 77 | Ht 68.0 in | Wt 152.8 lb

## 2015-01-29 DIAGNOSIS — J449 Chronic obstructive pulmonary disease, unspecified: Secondary | ICD-10-CM

## 2015-01-29 MED ORDER — PREDNISOLONE 5 MG PO TABS
5.0000 mg | ORAL_TABLET | Freq: Every day | ORAL | Status: DC
Start: 2015-01-29 — End: 2015-02-20

## 2015-01-29 MED ORDER — MONTELUKAST SODIUM 10 MG PO TABS: 10.0000 mg | ORAL_TABLET | Freq: Every day | ORAL | Status: DC

## 2015-01-29 NOTE — Patient Instructions (Addendum)
Hold prednisone at 5 mg daily - ok to double to 10 mg per day if losing any ground or needing more rescue  Continue singulair and dulera 200  Only use your albuterol as a rescue medication to be used if you can't catch your breath by resting or doing a relaxed purse lip breathing pattern.  - The less you use it, the better it will work when you need it. - Ok to use up to 2 puffs  every 4 hours if you must but call for immediate appointment if use goes up over your usual need - Don't leave home without it !!  (think of it like the spare tire for your car)   Please schedule a follow up visit in 3 months but call sooner if needed
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
## 2015-01-29 NOTE — Assessment & Plan Note (Signed)
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 2008  - PFT's 2007  FEV1  1.78 (50%) ratio 52 and 19% resp to saba, dlco 75% - d/c coreg 09/03/2013  - spirometry 12/18/2014 >  FEV1 1.0 (50%) ratio 52     > trial of dulera 200 2bid and prednisone 5 mg daily    01/29/2015  extensive coaching HFA effectiveness =    90%  Doing much better now than off coreg and advair and has reached his historical low for pred rx  The goal with a chronic steroid dependent illness is always arriving at the lowest effective dose that controls the disease/symptoms and not accepting a set "formula" which is based on statistics or guidelines that don't always take into account patient  variability or the natural hx of the dz in every individual patient, which may well vary over time.  For now therefore I recommend the patient maintain  Floor of 5 mg and ceiling of 10 mg daily   I had an extended discussion with the patient reviewing all relevant studies completed to date and  lasting 15 to 20 minutes of a 25 minute visit    Each maintenance medication was reviewed in detail including most importantly the difference between maintenance and prns and under what circumstances the prns are to be triggered using an action plan format that is not reflected in the computer generated alphabetically organized AVS.    Please see instructions for details which were reviewed in writing and the patient given a copy highlighting the part that I personally wrote and discussed at today's ov.

## 2015-02-01 ENCOUNTER — Telehealth: Payer: Self-pay | Admitting: Internal Medicine

## 2015-02-01 NOTE — Telephone Encounter (Signed)
Pt last seen on 10.10.16 with MW with following instructions       Patient Instructions     Hold prednisone at 5 mg daily - ok to double to 10 mg per day if losing any ground or needing more rescue  Continue singulair and dulera 200  Only use your albuterol as a rescue medication to be used if you can't catch your breath by resting or doing a relaxed purse lip breathing pattern.  - The less you use it, the better it will work when you need it. - Ok to use up to 2 puffs every 4 hours if you must but call for immediate appointment if use goes up over your usual need - Don't leave home without it !! (think of it like the spare tire for your car)   Please schedule a follow up visit in 3 months but call sooner if needed    LVM for [place] to return our call.

## 2015-02-01 NOTE — Telephone Encounter (Signed)
Spoke with Helene Kelp, pharmacist the the [facility]  I advised rx should have been for prednisone 5 mg tablets NOT prednisolone  She will correct this  Nothing further needed

## 2015-02-13 ENCOUNTER — Other Ambulatory Visit: Payer: Self-pay | Admitting: *Deleted

## 2015-02-19 ENCOUNTER — Other Ambulatory Visit: Payer: Self-pay | Admitting: *Deleted

## 2015-02-19 DIAGNOSIS — E119 Type 2 diabetes mellitus without complications: Secondary | ICD-10-CM

## 2015-02-19 NOTE — Telephone Encounter (Signed)
Patient is out of medication.  Theodore Isidore L, RN  

## 2015-02-19 NOTE — Telephone Encounter (Signed)
2nd request.  Martin, Tamika L, RN  

## 2015-02-20 ENCOUNTER — Other Ambulatory Visit: Payer: Self-pay | Admitting: Emergency Medicine

## 2015-02-20 ENCOUNTER — Other Ambulatory Visit: Payer: Self-pay | Admitting: Internal Medicine

## 2015-02-20 MED ORDER — PREDNISONE 5 MG PO TABS: 5.0000 mg | ORAL_TABLET | Freq: Every day | ORAL | Status: DC

## 2015-02-20 MED ORDER — METFORMIN HCL 1000 MG PO TABS: 1000.0000 mg | ORAL_TABLET | Freq: Two times a day (BID) | ORAL | Status: DC

## 2015-02-20 MED ORDER — PREDNISOLONE 5 MG PO TABS: 5.0000 mg | ORAL_TABLET | Freq: Every day | ORAL | Status: DC

## 2015-02-27 ENCOUNTER — Encounter (HOSPITAL_COMMUNITY): Payer: Self-pay | Admitting: Vascular Surgery

## 2015-02-27 ENCOUNTER — Emergency Department (HOSPITAL_COMMUNITY): Payer: No Typology Code available for payment source

## 2015-02-27 ENCOUNTER — Emergency Department (HOSPITAL_COMMUNITY)
Admission: EM | Admit: 2015-02-27 | Discharge: 2015-02-27 | Disposition: A | Discharge status: Home / Self Care | Payer: No Typology Code available for payment source | Attending: Emergency Medicine | Admitting: Emergency Medicine

## 2015-02-27 DIAGNOSIS — S01511A Laceration without foreign body of lip, initial encounter: Secondary | ICD-10-CM | POA: Insufficient documentation

## 2015-02-27 DIAGNOSIS — Z7984 Long term (current) use of oral hypoglycemic drugs: Secondary | ICD-10-CM | POA: Insufficient documentation

## 2015-02-27 DIAGNOSIS — Z872 Personal history of diseases of the skin and subcutaneous tissue: Secondary | ICD-10-CM | POA: Insufficient documentation

## 2015-02-27 DIAGNOSIS — E785 Hyperlipidemia, unspecified: Secondary | ICD-10-CM | POA: Insufficient documentation

## 2015-02-27 DIAGNOSIS — Z79899 Other long term (current) drug therapy: Secondary | ICD-10-CM | POA: Insufficient documentation

## 2015-02-27 DIAGNOSIS — J45909 Unspecified asthma, uncomplicated: Secondary | ICD-10-CM | POA: Insufficient documentation

## 2015-02-27 DIAGNOSIS — Z8719 Personal history of other diseases of the digestive system: Secondary | ICD-10-CM | POA: Insufficient documentation

## 2015-02-27 DIAGNOSIS — Z87891 Personal history of nicotine dependence: Secondary | ICD-10-CM | POA: Insufficient documentation

## 2015-02-27 DIAGNOSIS — I2510 Atherosclerotic heart disease of native coronary artery without angina pectoris: Secondary | ICD-10-CM | POA: Insufficient documentation

## 2015-02-27 DIAGNOSIS — Z9889 Other specified postprocedural states: Secondary | ICD-10-CM | POA: Insufficient documentation

## 2015-02-27 DIAGNOSIS — E119 Type 2 diabetes mellitus without complications: Secondary | ICD-10-CM | POA: Insufficient documentation

## 2015-02-27 DIAGNOSIS — Y9389 Activity, other specified: Secondary | ICD-10-CM | POA: Insufficient documentation

## 2015-02-27 DIAGNOSIS — Y92410 Unspecified street and highway as the place of occurrence of the external cause: Secondary | ICD-10-CM | POA: Insufficient documentation

## 2015-02-27 DIAGNOSIS — I252 Old myocardial infarction: Secondary | ICD-10-CM | POA: Insufficient documentation

## 2015-02-27 DIAGNOSIS — Z7952 Long term (current) use of systemic steroids: Secondary | ICD-10-CM | POA: Insufficient documentation

## 2015-02-27 DIAGNOSIS — Z7982 Long term (current) use of aspirin: Secondary | ICD-10-CM | POA: Insufficient documentation

## 2015-02-27 DIAGNOSIS — Y998 Other external cause status: Secondary | ICD-10-CM | POA: Insufficient documentation

## 2015-02-27 DIAGNOSIS — S0993XA Unspecified injury of face, initial encounter: Secondary | ICD-10-CM

## 2015-02-27 MED ORDER — OXYCODONE-ACETAMINOPHEN 5-325 MG PO TABS: 1.0000 | ORAL_TABLET | ORAL | Status: DC | PRN

## 2015-02-27 MED ORDER — TETANUS-DIPHTH-ACELL PERTUSSIS 5-2.5-18.5 LF-MCG/0.5 IM SUSP
0.5000 mL | Freq: Once | INTRAMUSCULAR | Status: AC
  Administered 2015-02-27: 0.5 mL via INTRAMUSCULAR
  Filled 2015-02-27: qty 0.5

## 2015-02-27 MED ORDER — OXYCODONE HCL 5 MG PO TABS
5.0000 mg | ORAL_TABLET | Freq: Once | ORAL | Status: AC
  Administered 2015-02-27: 5 mg via ORAL
  Filled 2015-02-27: qty 1

## 2015-02-27 NOTE — Discharge Instructions (Signed)
You have worsening headache, change in vision, difficulty walking, becoming very sleepy please return to the emergency room immediately.  Please follow with your dentist as soon as possible.  Take oxycodone for breakthrough pain, do not drink alcohol, drive, care for children or do other critical tasks while taking oxycodone.  Please follow with your primary care doctor in the next 2 days for a check-up. They must obtain records for further management.   Do not hesitate to return to the Emergency Department for any new, worsening or concerning symptoms.
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
## 2015-03-19 ENCOUNTER — Ambulatory Visit: Payer: No Typology Code available for payment source

## 2015-03-21 ENCOUNTER — Ambulatory Visit (INDEPENDENT_AMBULATORY_CARE_PROVIDER_SITE_OTHER): Payer: Self-pay | Admitting: *Deleted

## 2015-03-21 DIAGNOSIS — Z23 Encounter for immunization: Secondary | ICD-10-CM

## 2015-03-21 DIAGNOSIS — Z09 Encounter for follow-up examination after completed treatment for conditions other than malignant neoplasm: Secondary | ICD-10-CM

## 2015-04-05 ENCOUNTER — Ambulatory Visit (INDEPENDENT_AMBULATORY_CARE_PROVIDER_SITE_OTHER): Payer: No Typology Code available for payment source | Admitting: Family Medicine

## 2015-04-05 ENCOUNTER — Ambulatory Visit
Admission: RE | Admit: 2015-04-05 | Discharge: 2015-04-05 | Disposition: A | Discharge status: Home / Self Care | Payer: No Typology Code available for payment source | Source: Ambulatory Visit | Attending: Family Medicine | Admitting: Family Medicine

## 2015-04-05 VITALS — BP 122/75 | HR 81 | Temp 98.2°F | Ht 68.0 in | Wt 151.0 lb

## 2015-04-05 DIAGNOSIS — M25511 Pain in right shoulder: Secondary | ICD-10-CM
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
## 2015-04-05 NOTE — Patient Instructions (Signed)
Just use tylenol Checking xray Referring to orthopedic doctor - someone will call with this appointment.  Be well, Dr. Ardelia Mems

## 2015-04-10 ENCOUNTER — Encounter: Payer: Self-pay | Admitting: Family Medicine

## 2015-04-19 ENCOUNTER — Ambulatory Visit: Payer: No Typology Code available for payment source

## 2015-04-20 ENCOUNTER — Other Ambulatory Visit: Payer: Self-pay | Admitting: *Deleted

## 2015-04-20 IMAGING — DX DG WRIST COMPLETE 3+V*L*
4 series · 4 of 4 positions shown · non-contrast
Comparison: None.

CLINICAL DATA: Lateral wrist pain, bruising

EXAM:
LEFT WRIST - COMPLETE 3+ VIEW

[wrist pa]
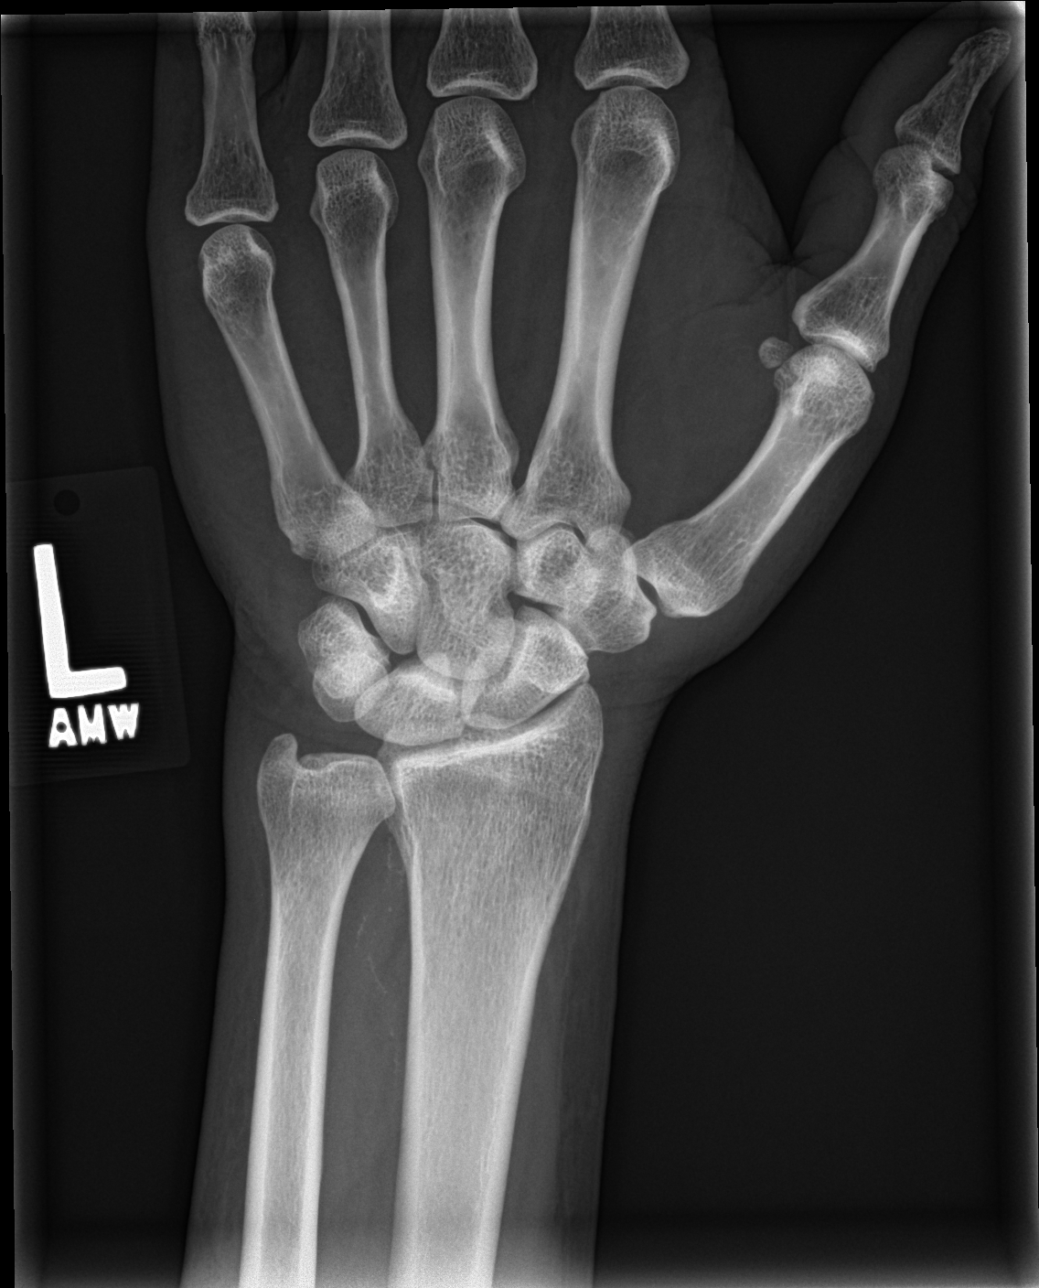

[wrist obl]
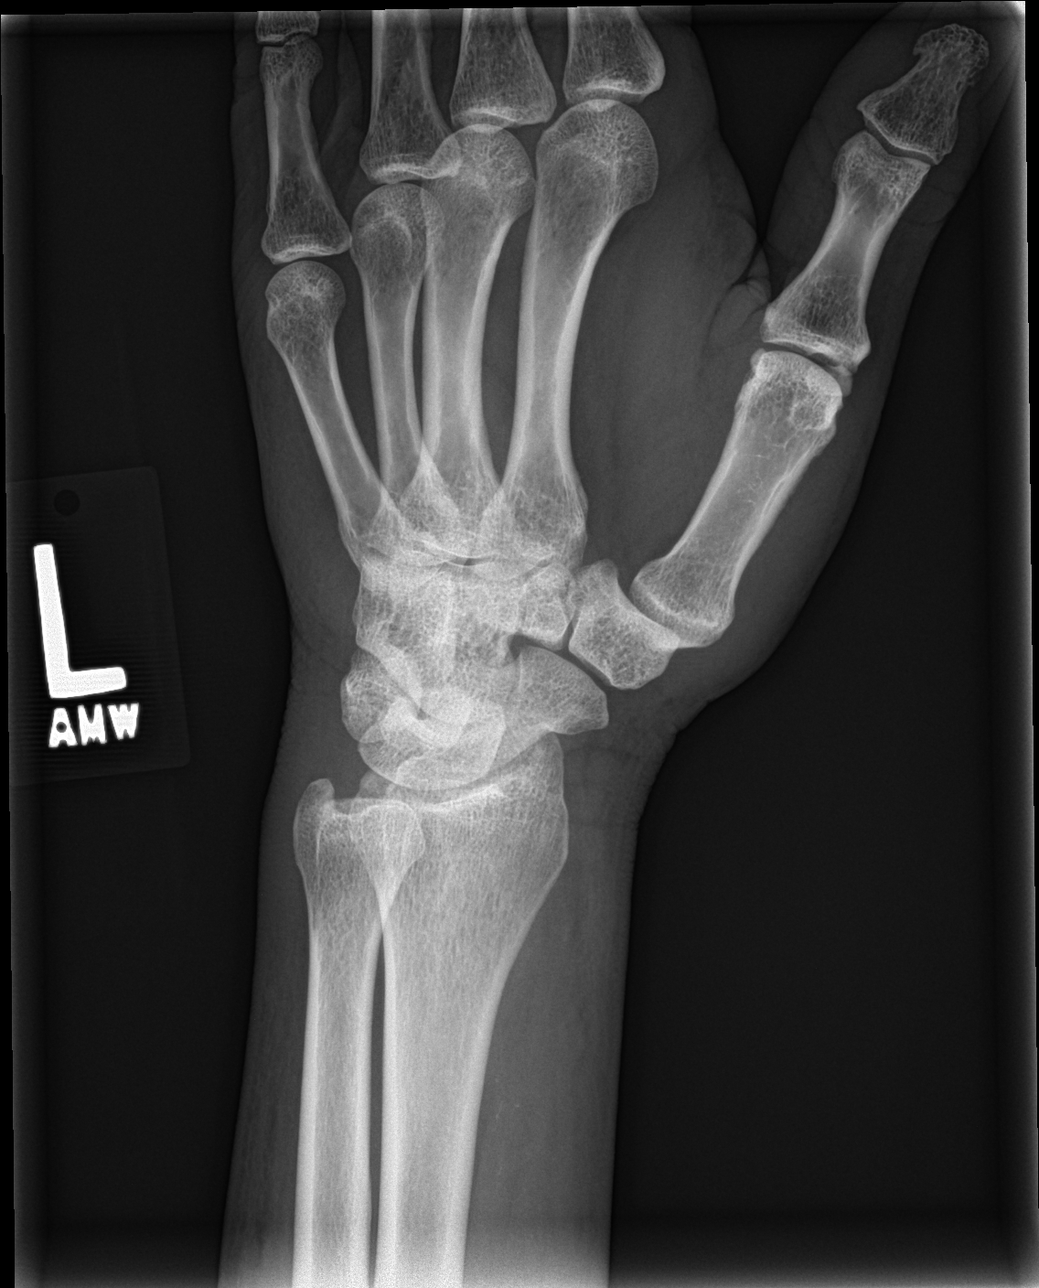

[wrist lat]
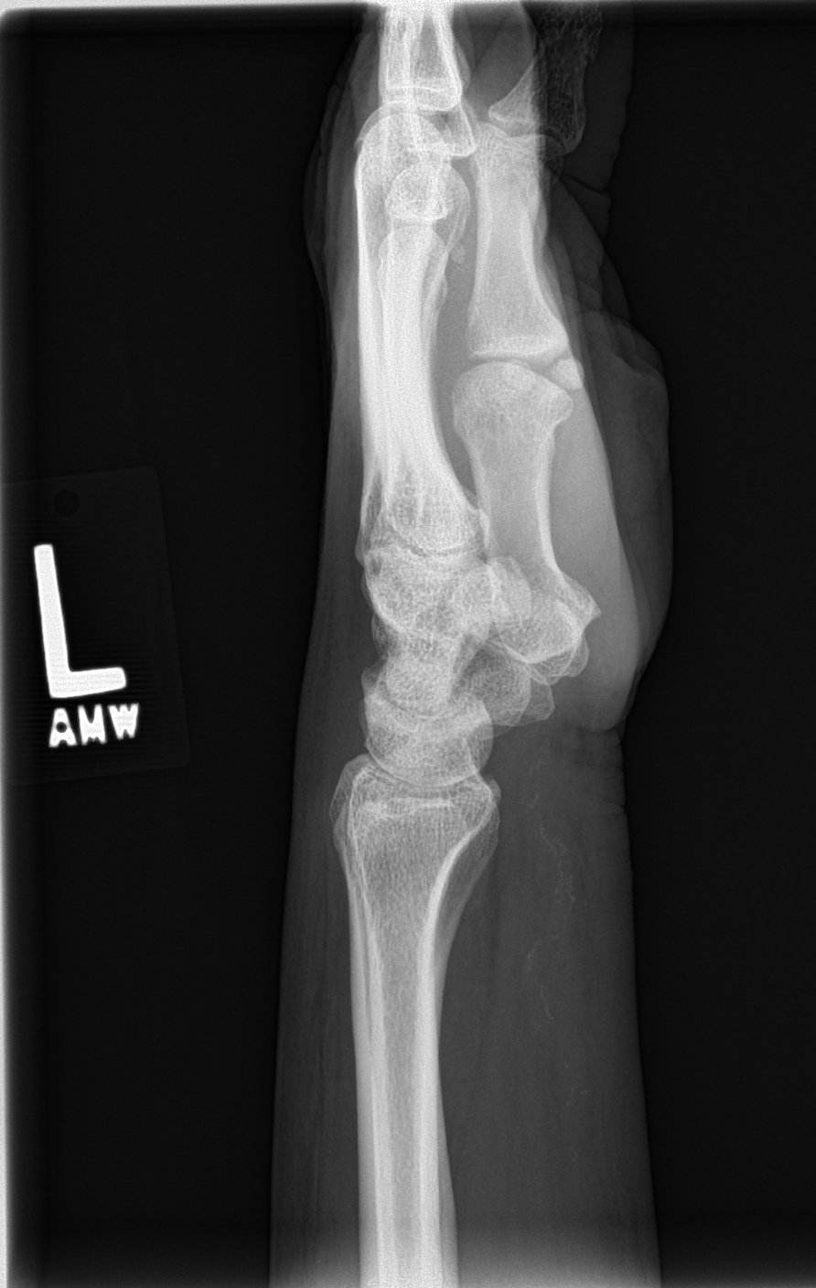

[wrist navicular]
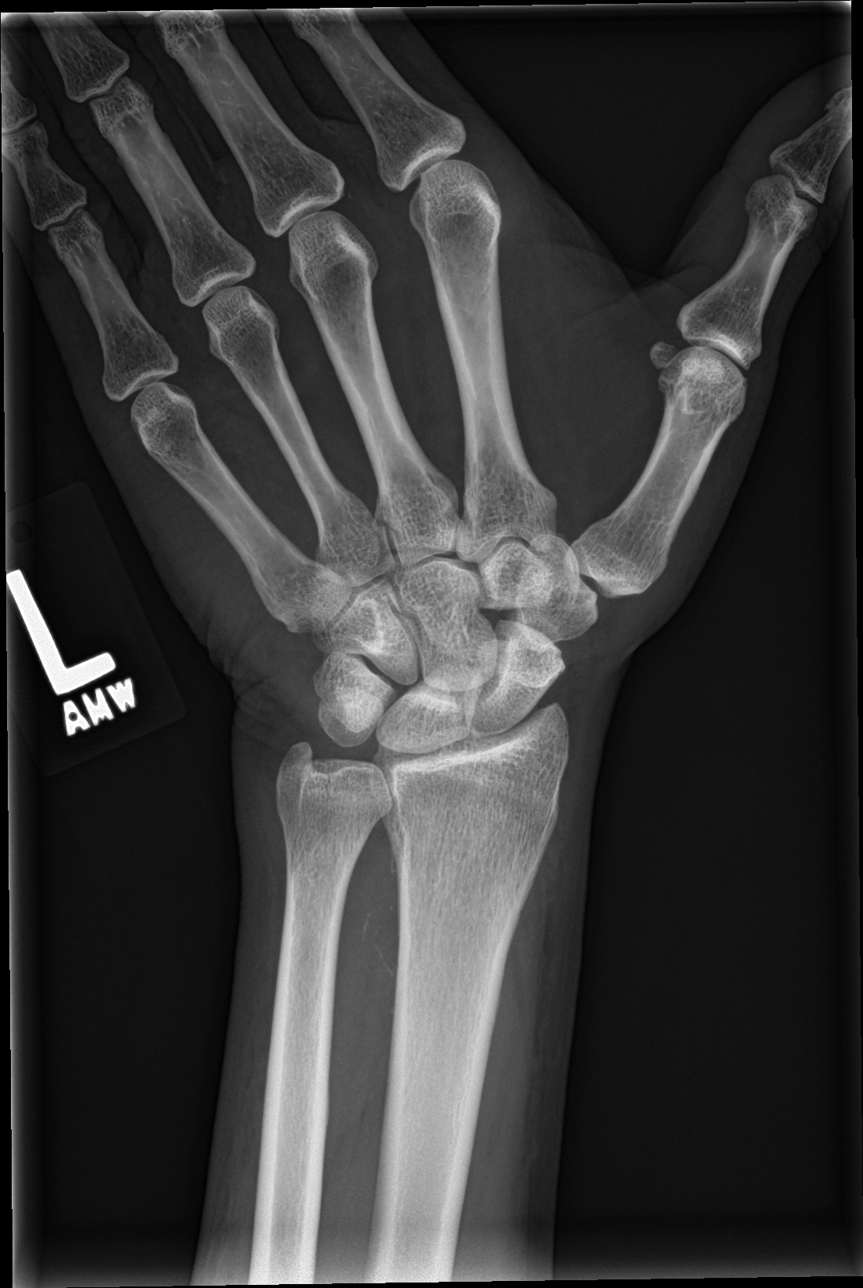

[4 of 4 positions shown; findings below may reference images not displayed]

FINDINGS: No fracture or dislocation is seen.

The joint spaces are preserved.

The visualized soft tissues are unremarkable.
IMPRESSION: No fracture or dislocation is seen.

## 2015-04-20 MED ORDER — PREDNISONE 5 MG PO TABS: 5.0000 mg | ORAL_TABLET | Freq: Every day | ORAL | Status: DC

## 2015-04-22 DIAGNOSIS — I10 Essential (primary) hypertension: Secondary | ICD-10-CM

## 2015-04-22 HISTORY — DX: Essential (primary) hypertension: I10

## 2015-04-24 ENCOUNTER — Ambulatory Visit: Payer: No Typology Code available for payment source

## 2015-05-01 ENCOUNTER — Encounter: Payer: Self-pay | Admitting: Internal Medicine

## 2015-05-01 ENCOUNTER — Ambulatory Visit (INDEPENDENT_AMBULATORY_CARE_PROVIDER_SITE_OTHER): Payer: No Typology Code available for payment source | Admitting: Internal Medicine

## 2015-05-01 VITALS — BP 124/84 | HR 92 | Ht 68.0 in | Wt 153.4 lb

## 2015-05-01 DIAGNOSIS — J449 Chronic obstructive pulmonary disease, unspecified: Secondary | ICD-10-CM

## 2015-05-01 MED ORDER — TIOTROPIUM BROMIDE MONOHYDRATE 2.5 MCG/ACT IN AERS: INHALATION_SPRAY | RESPIRATORY_TRACT | Status: DC

## 2015-05-01 MED ORDER — PREDNISONE 5 MG PO TABS: 5.0000 mg | ORAL_TABLET | Freq: Every day | ORAL | Status: DC

## 2015-05-01 MED ORDER — FAMOTIDINE 20 MG PO TABS: ORAL_TABLET | ORAL | Status: DC

## 2015-05-01 MED ORDER — PANTOPRAZOLE SODIUM 40 MG PO TBEC: 40.0000 mg | DELAYED_RELEASE_TABLET | Freq: Every day | ORAL | Status: DC

## 2015-05-01 NOTE — Patient Instructions (Addendum)
Pantoprazole (protonix) 40 mg   Take  30-60 min before first meal of the day and Pepcid (famotidine)  20 mg one @  bedtime until return to office - this is the best way to tell whether stomach acid is contributing to your problem.    GERD (REFLUX)  is an extremely common cause of respiratory symptoms just like yours , many times with no obvious heartburn at all.    It can be treated with medication, but also with lifestyle changes including elevation of the head of your bed (ideally with 6 inch  bed blocks),  Smoking cessation, avoidance of late meals, excessive alcohol, and avoid fatty foods, chocolate, peppermint, colas, red wine, and acidic juices such as orange juice.  NO MINT OR MENTHOL PRODUCTS SO NO COUGH DROPS  USE SUGARLESS CANDY INSTEAD (Jolley ranchers or Stover's or Life Savers) or even ice chips will also do - the key is to swallow to prevent all throat clearing. NO OIL BASED VITAMINS - use powdered substitutes.    Prednisone 5 mg 2 daily if breathing poorly/ otherwise one daily  Sprivia 2 puffs each am   Please schedule a follow up office visit in 6 weeks, call sooner if needed with pfts  Late add : ask him to stop calcium carbonate and replace with calcium gluconate in roughly  equivaltent amt of ca and d
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
## 2015-05-02 ENCOUNTER — Telehealth: Payer: Self-pay | Admitting: *Deleted

## 2015-05-02 ENCOUNTER — Ambulatory Visit: Payer: No Typology Code available for payment source

## 2015-05-02 NOTE — Telephone Encounter (Signed)
LMTCB

## 2015-05-02 NOTE — Telephone Encounter (Signed)
-----   Message from Tanda Rockers, MD sent at 05/02/2015  8:52 AM EST -----  ask him to stop calcium carbonate and replace with calcium gluconate in roughly  equivaltent amt of ca and d

## 2015-05-02 NOTE — Telephone Encounter (Signed)
Pt is aware of MW's recommendation. Nothing further was needed. 

## 2015-05-02 NOTE — Assessment & Plan Note (Addendum)
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 2008  - PFT's 2007  FEV1  1.78 (50%) ratio 52 and 19% resp to saba, dlco 75% - d/c coreg 09/03/2013  - spirometry 12/18/2014 >  FEV1 1.04 (34%) ratio 53     > trial of dulera 200 2bid and prednisone 5 mg daily  - 01/29/2015  extensive coaching HFA effectiveness =    90%  - max gerd rx added 05/01/2015 > f/u pfts scheduled    I am very suspicious of an upper airway component here based on today's exam and note his prev f/v loop was not entirley physiologic in the effort dep portion so rec max rx for gerd and f/u with pfts for eval of ? vcd  Masquerading as dtc asthma/copd ACOS   In meantime advised keep working toward a floor of pred 5 mg daily and added spiriva respimat trial for the copd component  - The proper method of use, as well as anticipated side effects, of a metered-dose inhaler are discussed and demonstrated to the patient. Improved effectiveness after extensive coaching during this visit to a level of approximately 75 % from a baseline of 50 %   I had an extended discussion with the patient reviewing all relevant studies completed to date and  lasting 15 to 20 minutes of a 25 minute visit    Each maintenance medication was reviewed in detail including most importantly the difference between maintenance and prns and under what circumstances the prns are to be triggered using an action plan format that is not reflected in the computer generated alphabetically organized AVS.    Please see instructions for details which were reviewed in writing and the patient given a copy highlighting the part that I personally wrote and discussed at today's ov.

## 2015-05-03 ENCOUNTER — Ambulatory Visit: Payer: No Typology Code available for payment source

## 2015-05-09 NOTE — Progress Notes (Signed)
Chief Complaint  Patient presents with  . Follow-up     History of Present Illness: 55 yo male with history of CAD, asthma, hyperlipidemia here today for cardiac f/u. He has been followed in the past by Dr. Olevia Perches.  In 2008 he had an anterior MI treated with a DES to the LAD. His ejection fraction was 45% at that time. Myoview 2010 showed no ischemia and showed an ejection fraction of 40%. Echo January 2016 with LVEF=40%. Apical hypokinesis. No LV thrombus seen.   He is here today for follow up. He has had no chest pain, SOB or palpitations. No LE edema. He has been tolerating all medications without difficulty. He is exercising.   His primary care is the [facility].   Past Medical History  Diagnosis Date  . Asthma   . Dental caries   . CAD (coronary artery disease)   . GERD (gastroesophageal reflux disease)   . Hyperlipidemia   . Asthma   . Thyroid nodule     biopsy negative 2006  . Myocardial infarction ([place]) 2008  . Cellulitis of leg, right 10/2014  . Diabetes mellitus     TYPE 2    Past Surgical History  Procedure Laterality Date  . Coronary stent placement    . Solitary pulm and thryoid nodules      Current Outpatient Prescriptions  Medication Sig Dispense Refill  . albuterol (PROVENTIL HFA;VENTOLIN HFA) 108 (90 BASE) MCG/ACT inhaler Inhale 2 puffs into the lungs every 4 (four) hours as needed for wheezing or shortness of breath. 3 Inhaler 1  . albuterol (PROVENTIL) (2.5 MG/3ML) 0.083% nebulizer solution Take 2.5 mg by nebulization every 4 (four) hours as needed for wheezing or shortness of breath.    Marland Kitchen aspirin (ADULT ASPIRIN EC LOW STRENGTH) 81 MG EC tablet Take 81 mg by mouth daily.     Marland Kitchen atorvastatin (LIPITOR) 40 MG tablet Take 1 tablet (40 mg total) by mouth at bedtime. 30 tablet 11  . Calcium Carbonate-Vit D-Min 600-400 MG-UNIT TABS Take 1 tablet by mouth 2 (two) times daily.     . clopidogrel (PLAVIX) 75 MG tablet Take 1 tablet (75 mg total)  by mouth daily. 30 tablet 11  . famotidine (PEPCID) 20 MG tablet Take 20 mg by mouth at bedtime.    . metFORMIN (GLUCOPHAGE) 1000 MG tablet Take 1 tablet (1,000 mg total) by mouth 2 (two) times daily with a meal. 60 tablet 11  . mometasone (NASONEX) 50 MCG/ACT nasal spray Place 2 sprays into the nose daily as needed (Allergies).    . mometasone-formoterol (DULERA) 200-5 MCG/ACT AERO Inhale 2 puffs into the lungs every 12 (twelve) hours.    . montelukast (SINGULAIR) 10 MG tablet Take 1 tablet (10 mg total) by mouth at bedtime. 30 tablet 11  . Multiple Vitamin (MULTIVITAMIN) tablet Take 1 tablet by mouth daily as needed (for supplementation).    Marland Kitchen oxyCODONE-acetaminophen (PERCOCET/ROXICET) 5-325 MG tablet Take 1 tablet by mouth every 4 (four) hours as needed for severe pain. May take 2 tablets PO q 6 hours for severe pain - Do not take with Tylenol as this tablet already contains tylenol 15 tablet 0  . pantoprazole (PROTONIX) 40 MG tablet Take 1 tablet (40 mg total) by mouth daily. Take 30-60 min before first meal of the day 30 tablet 2  . predniSONE (DELTASONE) 5 MG tablet Take 1-2 tablets (5-10 mg total) by mouth daily with breakfast. 60 tablet 1  . Tiotropium Bromide  Monohydrate (SPIRIVA RESPIMAT IN) Inhale 2 sprays into the lungs every morning.     No current facility-administered medications for this visit.    No Known Allergies  Social History   Social History  . Marital Status: Married    Spouse Name: N/A  . Number of Children: N/A  . Years of Education: N/A   Occupational History  . Not on file.   Social History Main Topics  . Smoking status: Former Smoker -- 0.50 packs/day for 22 years    Types: Cigarettes    Quit date: 04/21/2006  . Smokeless tobacco: Never Used     Comment: quit 10 years ago  . Alcohol Use: No  . Drug Use: No  . Sexual Activity: Yes    Birth Control/ Protection: Condom   Other Topics Concern  . Not on file   Social History Narrative   Lives with  wife; recently moved to [place] from [place] due to health problems. Has no means of affording meds currently + tobacco 1/2ppd x 26yrs, no ETOH no drugs.    Family History  Problem Relation Age of Onset  . Diabetes Mother   . Heart attack Brother 36    Review of Systems:  As stated in the HPI and otherwise negative.   BP 122/78 mmHg  Pulse 71  Ht 5\' 8"  (1.727 m)  Wt 154 lb 6.4 oz (70.035 kg)  BMI 23.48 kg/m2  Physical Examination: General: Well developed, well nourished, NAD HEENT: OP clear, mucus membranes moist SKIN: warm, dry. No rashes. Neuro: No focal deficits Musculoskeletal: Muscle strength 5/5 all ext Psychiatric: Mood and affect normal Neck: No JVD, no carotid bruits, no thyromegaly, no lymphadenopathy. Lungs:Clear bilaterally, no wheezes, rhonci, crackles Cardiovascular: Regular rate and rhythm. No murmurs, gallops or rubs. Abdomen:Soft. Bowel sounds present. Non-tender.  Extremities: No lower extremity edema. Pulses are 2 + in the bilateral DP/PT.  Echo January 2016: Left ventricle: The cavity size was normal. Wall thickness was increased in a pattern of mild LVH. There was mild focal basal hypertrophy of the septum. Systolic function was moderately reduced. The estimated ejection fraction was in the range of 35% to 40%. There is akinesis of the anteroseptal and apical myocardium. Doppler parameters are consistent with abnormal left ventricular relaxation (grade 1 diastolic dysfunction).  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 Definity used; no evidence of apical thrombus.  EKG:  EKG is ordered today. The ekg ordered today demonstrates NSR, rate 71 bpm. Q waves inferior leads. ST abn anterior leads, unchanged.   Recent Labs: 11/12/2014: BUN 9; Creatinine, Ser 1.08; Potassium 4.1; Sodium 139 11/14/2014: Hemoglobin 12.4*; Platelets 275    Lipid Panel    Component Value Date/Time   CHOL 104 06/24/2011 1109   TRIG 61 06/24/2011 1109   HDL 52 06/24/2011 1109   CHOLHDL 2.0 06/24/2011 1109   VLDL 12 06/24/2011 1109   LDLCALC 40 06/24/2011 1109   LDLDIRECT 98 07/28/2012 1701     Wt Readings from Last 3 Encounters:  05/10/15 154 lb 6.4 oz (70.035 kg)  05/01/15 153 lb 6.4 oz (69.582 kg)  04/05/15 151 lb (68.493 kg)     Other studies Reviewed: Additional studies/ records that were reviewed today include: . Review of the above records demonstrates:    Assessment and Plan:   1. CAD: Stable. Will continue ASA and Plavix, statin. Lipids followed in primary care. He needs this updated  now.     2. Cardiomyopathy, ischemic:  He is not on a beta blocker due to reactive airways disease. Volume status is ok. ? Why he is not on an Ace-inhibitor.   3. HTN: BP controlled. No changes.   Current medicines are reviewed at length with the patient today.  The patient does not have concerns regarding medicines.  The following changes have been made:  no change  Labs/ tests ordered today include:   Orders Placed This Encounter  Procedures  . Hepatic function panel  . Lipid Profile  . EKG 12-Lead    Disposition:   FU with me in 12  months  Signed, Lauree Chandler, MD 05/10/2015 9:34 AM    [facility] [place], [place], [place]  [address] Phone: [phone]; Fax: [phone]

## 2015-05-10 ENCOUNTER — Encounter: Payer: Self-pay | Admitting: Cardiovascular Disease

## 2015-05-10 ENCOUNTER — Ambulatory Visit (INDEPENDENT_AMBULATORY_CARE_PROVIDER_SITE_OTHER): Payer: No Typology Code available for payment source | Admitting: Cardiovascular Disease

## 2015-05-10 VITALS — BP 122/78 | HR 71 | Ht 68.0 in | Wt 154.4 lb

## 2015-05-10 DIAGNOSIS — I255 Ischemic cardiomyopathy: Secondary | ICD-10-CM

## 2015-05-10 DIAGNOSIS — I2510 Atherosclerotic heart disease of native coronary artery without angina pectoris: Secondary | ICD-10-CM

## 2015-05-10 DIAGNOSIS — I10 Essential (primary) hypertension: Secondary | ICD-10-CM

## 2015-05-10 LAB — HEPATIC FUNCTION PANEL
ALT: 20 U/L (ref 9–46)
AST: 20 U/L (ref 10–35)
Albumin: 3.6 g/dL (ref 3.6–5.1)
Alkaline Phosphatase: 47 U/L (ref 40–115)
BILIRUBIN INDIRECT: 0.3 mg/dL (ref 0.2–1.2)
BILIRUBIN TOTAL: 0.4 mg/dL (ref 0.2–1.2)
Bilirubin, Direct: 0.1 mg/dL (ref <=0.2)
Total Protein: 6.8 g/dL (ref 6.1–8.1)

## 2015-05-10 LAB — LIPID PANEL
Cholesterol: 110 mg/dL — ABNORMAL LOW (ref 125–200)
HDL: 54 mg/dL (ref >=40)
LDL CALC: 37 mg/dL (ref <130)
Total CHOL/HDL Ratio: 2 Ratio (ref <=5.0)
Triglycerides: 94 mg/dL (ref <150)
VLDL: 19 mg/dL (ref <30)

## 2015-05-10 MED ORDER — ATORVASTATIN CALCIUM 40 MG PO TABS: 40.0000 mg | ORAL_TABLET | Freq: Every day | ORAL | Status: DC

## 2015-05-10 NOTE — Patient Instructions (Signed)
Medication Instructions:  Your physician recommends that you continue on your current medications as directed. Please refer to the Current Medication list given to you today.   Labwork: Lab work to be done today--Lipid and Liver profiles  Testing/Procedures: none  Follow-Up: Your physician wants you to follow-up in: 12 months.  You will receive a reminder letter in the mail two months in advance. If you don't receive a letter, please call our office to schedule the follow-up appointment.   Any Other Special Instructions Will Be Listed Below (If Applicable).     If you need a refill on your cardiac medications before your next appointment, please call your pharmacy.   

## 2015-06-12 ENCOUNTER — Encounter: Payer: Self-pay | Admitting: Internal Medicine

## 2015-06-12 ENCOUNTER — Ambulatory Visit (INDEPENDENT_AMBULATORY_CARE_PROVIDER_SITE_OTHER): Payer: No Typology Code available for payment source | Admitting: Internal Medicine

## 2015-06-12 VITALS — BP 118/76 | HR 98 | Ht 68.0 in | Wt 151.4 lb

## 2015-06-12 DIAGNOSIS — J449 Chronic obstructive pulmonary disease, unspecified: Secondary | ICD-10-CM

## 2015-06-12 NOTE — Patient Instructions (Signed)
No change in medications   Continue prednisone 5 mg daily and ok to double if having more trouble breathing  Please schedule a follow up visit in 3 months but call sooner if needed

## 2015-06-12 NOTE — Assessment & Plan Note (Signed)
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 2008  - PFT's 2007  FEV1  1.78 (50%) ratio 52 and 19% resp to saba, dlco 75% - d/c coreg 09/03/2013  - spirometry 12/18/2014 >  FEV1 1.04 (34%) ratio 53  > trial of dulera 200 2bid and prednisone 5 mg daily  - 01/29/2015  extensive coaching HFA effectiveness =    90%  - Spiriva respimat added 05/01/2015  - max gerd rx 05/01/2015 > improved 06/12/2015   Finally appears to be turning the corner  The goal with a chronic steroid dependent illness is always arriving at the lowest effective dose that controls the disease/symptoms and not accepting a set "formula" which is based on statistics or guidelines that don't always take into account patient  variability or the natural hx of the dz in every individual patient, which may well vary over time.  For now therefore I recommend the patient maintain  Floor of 5 mg and ceiling of 10 mg daily and may try 2.5 mg daily next ov   I had an extended discussion with the patient reviewing all relevant studies completed to date and  lasting 15 to 20 minutes of a 25 minute visit    Each maintenance medication was reviewed in detail including most importantly the difference between maintenance and prns and under what circumstances the prns are to be triggered using an action plan format that is not reflected in the computer generated alphabetically organized AVS.    Please see instructions for details which were reviewed in writing and the patient given a copy highlighting the part that I personally wrote and discussed at today's ov.
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
## 2015-07-05 ENCOUNTER — Other Ambulatory Visit: Payer: Self-pay | Admitting: Internal Medicine

## 2015-07-05 MED ORDER — ALBUTEROL SULFATE HFA 108 (90 BASE) MCG/ACT IN AERS: 2.0000 | INHALATION_SPRAY | RESPIRATORY_TRACT | Status: DC | PRN

## 2015-07-12 ENCOUNTER — Ambulatory Visit (INDEPENDENT_AMBULATORY_CARE_PROVIDER_SITE_OTHER): Payer: No Typology Code available for payment source | Admitting: Internal Medicine

## 2015-07-12 ENCOUNTER — Telehealth: Payer: Self-pay | Admitting: *Deleted

## 2015-07-12 VITALS — BP 129/78 | HR 110 | Temp 98.0°F | Resp 24 | Ht 68.0 in | Wt 143.3 lb

## 2015-07-12 DIAGNOSIS — J441 Chronic obstructive pulmonary disease with (acute) exacerbation: Secondary | ICD-10-CM | POA: Insufficient documentation

## 2015-07-12 MED ORDER — ALBUTEROL SULFATE (2.5 MG/3ML) 0.083% IN NEBU: 2.5000 mg | INHALATION_SOLUTION | RESPIRATORY_TRACT | Status: DC | PRN

## 2015-07-12 MED ORDER — PREDNISONE 20 MG PO TABS: 40.0000 mg | ORAL_TABLET | Freq: Every day | ORAL | Status: AC

## 2015-07-12 MED ORDER — DOXYCYCLINE HYCLATE 100 MG PO TABS: 100.0000 mg | ORAL_TABLET | Freq: Two times a day (BID) | ORAL | Status: AC

## 2015-07-12 MED ORDER — MOMETASONE FUROATE 50 MCG/ACT NA SUSP
2.0000 | Freq: Every day | NASAL | Status: DC | PRN
Start: 2015-07-12 — End: 2015-08-08

## 2015-07-12 NOTE — Assessment & Plan Note (Signed)
Viral URI symptoms,  Followed by  COPD exacerbation.  Risk factors include DM (last A1C 9.0 in July 2016) per patient CBGs btw 115-150.  On examination with wheezing throughout during exam, no increased WOB, talking comfortably. Pulse oximetry 96%. Afebrile. Provided nebulizer treatment. Physical exam improved, no wheezing, no egophony, no crackles on follow up exam  -  Will provide 7 days of doxycycline 100 mg BID  -  5 days of prednisone 40 mg ( on 10 mg daily per pulmonology)  -  Advised patient to return if not improved in the next couple of days.  -  Consider CXR  For possible pneumonia and dosing with Levaquin if not improved in the next couple of days.

## 2015-07-12 NOTE — Patient Instructions (Addendum)
You have a COPD exacerbation likely from a virus. Will start you on doxycycline for 7 days as well as 5 days of prednisone. Please go to urgent care if no improvement in the next 2 days.

## 2015-07-12 NOTE — Progress Notes (Signed)
   [name] Kerrin Mo, MD Phone: [phone]  Reason For Visit: Acute Care visit   # COPD exacerbation. Per patient 5 day history of a cough with yellow/ brown sputum production. Patient with wheezing over the past 24 hours, has used albuterol x 8 throughout this time. Patient has also had some nasal congestion for the past several days, resolving yesterday. Denies any sore throat.  2 day history of fevers (Tmax 102 per patient) and chills, this resolved for the past 3 days, now just with cough. Patient states 3 days ago had ear discharge from his right ear with ear pain, but denies any ear pain currently   Per patient blood sugars have been stable over past few months with fasting glucoses between 115 and 150. No recent A1C.    Past Medical History Reviewed problem list.  Medications- reviewed and updated No additions to family history Social history- patient is a past smoker (23 years)   Objective: BP 129/78 mmHg  Pulse 110  Temp(Src) 98 F (36.7 C) (Oral)  Resp 24  Ht 5\' 8"  (1.727 m)  Wt 143 lb 4.8 oz (65 kg)  BMI 21.79 kg/m2  SpO2 96% Gen: NAD, alert, cooperative with exam HEENT: Oralpharynx with small ulcerations on uvula,TMs nml Neck: spotty lymphadenopathy  CV: RRR, good S1/S2, Resp: Wheezing throughout on initial exam, no increased WOB, follow-up exam after nebulizer treatment, no crackles or egophony noted   Assessment/Plan:   COPD exacerbation (HCC) Viral URI symptoms,  Followed by  COPD exacerbation.  Risk factors include DM (last A1C 9.0 in July 2016) per patient CBGs btw 115-150.  On examination with wheezing throughout during exam, no increased WOB, talking comfortably. Pulse oximetry 96%. Afebrile. Provided nebulizer treatment. Physical exam improved, no wheezing, no egophony, no crackles on follow up exam  -  Will provide 7 days of doxycycline 100 mg BID  -  5 days of prednisone 40 mg ( on 10 mg daily per pulmonology)  -  Advised  patient to return if not improved in the next couple of days.  -  Consider CXR  For possible pneumonia and dosing with Levaquin if not improved in the next couple of days.

## 2015-07-12 NOTE — Telephone Encounter (Signed)
Received faxed refill request for Nasonex from [facility].   Rx printed as this pharm does not take escribe rxs or calls and placed in MW's to do to sign upon his return to office next week. Will forward to [place] to document once rx signed.

## 2015-07-13 ENCOUNTER — Telehealth: Payer: Self-pay | Admitting: *Deleted

## 2015-07-13 ENCOUNTER — Other Ambulatory Visit: Payer: Self-pay | Admitting: *Deleted

## 2015-07-13 DIAGNOSIS — I2510 Atherosclerotic heart disease of native coronary artery without angina pectoris: Secondary | ICD-10-CM

## 2015-07-13 MED ORDER — ATORVASTATIN CALCIUM 40 MG PO TABS: 40.0000 mg | ORAL_TABLET | Freq: Every day | ORAL | Status: DC

## 2015-07-13 NOTE — Telephone Encounter (Signed)
done

## 2015-07-16 NOTE — Telephone Encounter (Signed)
Rx was signed and faxed to [facility]

## 2015-07-17 ENCOUNTER — Ambulatory Visit (INDEPENDENT_AMBULATORY_CARE_PROVIDER_SITE_OTHER): Payer: No Typology Code available for payment source | Admitting: Internal Medicine

## 2015-07-17 ENCOUNTER — Encounter: Payer: Self-pay | Admitting: Internal Medicine

## 2015-07-17 ENCOUNTER — Ambulatory Visit (INDEPENDENT_AMBULATORY_CARE_PROVIDER_SITE_OTHER)
Admission: RE | Admit: 2015-07-17 | Discharge: 2015-07-17 | Disposition: A | Discharge status: Home / Self Care | Payer: No Typology Code available for payment source | Source: Ambulatory Visit | Attending: Internal Medicine | Admitting: Internal Medicine

## 2015-07-17 VITALS — BP 130/80 | HR 111 | Temp 98.1°F | Ht 68.0 in | Wt 145.0 lb

## 2015-07-17 DIAGNOSIS — J449 Chronic obstructive pulmonary disease, unspecified: Secondary | ICD-10-CM

## 2015-07-17 MED ORDER — OXYCODONE-ACETAMINOPHEN 5-325 MG PO TABS: 1.0000 | ORAL_TABLET | ORAL | Status: DC | PRN

## 2015-07-17 MED FILL — OXYCODONE/APAP 5-325: 5-325 | 5 days supply | Qty: 40 | Fill #0

## 2015-07-17 NOTE — Assessment & Plan Note (Addendum)
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 2008  - PFT's 2007  FEV1  1.78 (50%) ratio 52 and 19% resp to saba, dlco 75% - d/c coreg 09/03/2013  - spirometry 12/18/2014 >  FEV1 1.04 (34%) ratio 53     > trial of dulera 200 2bid and prednisone 5 mg daily   - Spiriva respimat added 05/01/2015  - max gerd rx1/01/2016 > improved 06/12/2015  - 07/17/2015  extensive coaching HFA effectiveness =    90%   DDX of  difficult airways management almost all start with A and  include Adherence, Ace Inhibitors, Acid Reflux, Active Sinus Disease, Alpha 1 Antitripsin deficiency, Anxiety masquerading as Airways dz,  ABPA,  Allergy(esp in young), Aspiration (esp in elderly), Adverse effects of meds,  Active smokers, A bunch of PE's (a small clot burden can't cause this syndrome unless there is already severe underlying pulm or vascular dz with poor reserve) plus two Bs  = Bronchiectasis and Beta blocker use..and one C= CHF  Adherence is always the initial "prime suspect" and is a multilayered concern that requires a "trust but verify" approach in every patient - starting with knowing how to use medications, especially inhalers, correctly, keeping up with refills and understanding the fundamental difference between maintenance and prns vs those medications only taken for a very short course and then stopped and not refilled. - The proper method of use, as well as anticipated side effects, of a metered-dose inhaler are discussed and demonstrated to the patient. Improved effectiveness after extensive coaching during this visit to a level of approximately 90 % from a baseline of 75 %  - needs to repeat the medication reconciliation process.  To keep things simple, I have asked the patient to first separate medicines that are perceived as maintenance, that is to be taken daily "no matter what", from those medicines that are taken on only on an as-needed basis and I have given the patient examples of both, and then return to  see our NP to generate a  detailed  medication calendar which should be followed until the next physician sees the patient and updates it.    ? Acid (or non-acid) GERD > always difficult to exclude as up to 75% of pts in some series report no assoc GI/ Heartburn symptoms> rec continue max (24h)  acid suppression and diet restrictions/ reviewed  - note that he is on calcium carbonate but should be on calcium gluconate and will be instructed accordingly..    ? Allergy > taper pred back to baseline floor of 10 mg daily and continue singulair and high dose ICS   ? Active sinusitis > finish doxy/ consider sinus ct next    I had an extended discussion with the patient reviewing all relevant studies completed to date and  lasting 15 to 20 minutes of a 25 minute visit    Each maintenance medication was reviewed in detail including most importantly the difference between maintenance and prns and under what circumstances the prns are to be triggered using an action plan format that is not reflected in the computer generated alphabetically organized AVS.    Please see instructions for details which were reviewed in writing and the patient given a copy highlighting the part that I personally wrote and discussed at today's ov.

## 2015-07-17 NOTE — Patient Instructions (Signed)
Plan A = Automatic = Symbicort 160 2 every 12 hours                                      Spiriva 2 pffs each am   Plan B = Backup Only use your albuterol (proventil) as a rescue medication to be used if you can't catch your breath by resting or doing a relaxed purse lip breathing pattern.  - The less you use it, the better it will work when you need it. - Ok to use up to 2 puffs  every 4 hours if you must but call for appointment if use goes up over your usual need - Don't leave home without it !!  (think of it like the spare tire for your car)   Plan C = Crisis - only use your albuterol nebulizer if you first try Plan B and it fails to help > ok to use the nebulizer up to every 4 hours but if start needing it regularly call for immediate appointment  For cough > use percocet up to every 4 hours as needed   Taper prednisone gradually down to 10 mg per day no less and finish the doxycycline   See Tammy NP w/in 2 weeks with all your medications, even over the counter meds, separated in two separate bags, the ones you take no matter what vs the ones you stop once you feel better and take only as needed when you feel you need them.   Tammy  will generate for you a new user friendly medication calendar that will put [place] all on the same page re: your medication use.   Please remember to go to the   x-ray department downstairs for your tests - we will call you with the results when they are available.
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
## 2015-07-19 ENCOUNTER — Telehealth: Payer: Self-pay | Admitting: *Deleted

## 2015-07-19 NOTE — Telephone Encounter (Signed)
-----   Message from Tanda Rockers, MD sent at 07/18/2015 12:54 PM EDT ----- note that he is on calcium carbonate but should be on calcium gluconate instead due to suspected gerd

## 2015-07-19 NOTE — Telephone Encounter (Signed)
LMTCB

## 2015-07-19 NOTE — Progress Notes (Signed)
Quick Note:  LMTCB ______ 

## 2015-07-23 ENCOUNTER — Other Ambulatory Visit: Payer: Self-pay

## 2015-07-23 MED ORDER — PREDNISONE 5 MG PO TABS: 5.0000 mg | ORAL_TABLET | Freq: Every day | ORAL | Status: DC

## 2015-07-23 NOTE — Telephone Encounter (Signed)
Spoke with pt, aware of below.  Also requesting refill on prednisone.  This has been sent.  Nothing further needed.

## 2015-07-23 NOTE — Telephone Encounter (Signed)
LMTCB

## 2015-07-27 ENCOUNTER — Telehealth: Payer: Self-pay | Admitting: Internal Medicine

## 2015-07-27 NOTE — Telephone Encounter (Signed)
lmtcb for pt.  

## 2015-07-30 MED ORDER — FAMOTIDINE 20 MG PO TABS: 20.0000 mg | ORAL_TABLET | Freq: Every day | ORAL | Status: DC

## 2015-07-30 NOTE — Telephone Encounter (Signed)
Pt calling for refill of Pepcid 20mg  -- this has been sent to [place]. Nothing further needed.

## 2015-08-07 ENCOUNTER — Encounter: Payer: No Typology Code available for payment source | Admitting: Adult Health

## 2015-08-08 ENCOUNTER — Ambulatory Visit (INDEPENDENT_AMBULATORY_CARE_PROVIDER_SITE_OTHER): Payer: No Typology Code available for payment source | Admitting: Adult Health

## 2015-08-08 ENCOUNTER — Encounter: Payer: Self-pay | Admitting: Adult Health

## 2015-08-08 VITALS — BP 134/76 | HR 84 | Temp 97.8°F | Ht 68.0 in | Wt 151.0 lb

## 2015-08-08 DIAGNOSIS — J441 Chronic obstructive pulmonary disease with (acute) exacerbation: Secondary | ICD-10-CM

## 2015-08-08 DIAGNOSIS — J449 Chronic obstructive pulmonary disease, unspecified: Secondary | ICD-10-CM

## 2015-08-08 DIAGNOSIS — H6501 Acute serous otitis media, right ear: Secondary | ICD-10-CM

## 2015-08-08 MED ORDER — MONTELUKAST SODIUM 10 MG PO TABS: 10.0000 mg | ORAL_TABLET | Freq: Every day | ORAL | Status: DC

## 2015-08-08 MED ORDER — MOMETASONE FURO-FORMOTEROL FUM 200-5 MCG/ACT IN AERO: 2.0000 | INHALATION_SPRAY | Freq: Two times a day (BID) | RESPIRATORY_TRACT | Status: DC

## 2015-08-08 MED ORDER — PANTOPRAZOLE SODIUM 40 MG PO TBEC: 40.0000 mg | DELAYED_RELEASE_TABLET | Freq: Every day | ORAL | Status: DC

## 2015-08-08 MED ORDER — MOMETASONE FUROATE 50 MCG/ACT NA SUSP: 2.0000 | Freq: Every day | NASAL | Status: DC | PRN

## 2015-08-08 MED ORDER — TIOTROPIUM BROMIDE MONOHYDRATE 1.25 MCG/ACT IN AERS: 2.0000 | INHALATION_SPRAY | Freq: Two times a day (BID) | RESPIRATORY_TRACT | Status: DC

## 2015-08-08 MED ORDER — FAMOTIDINE 20 MG PO TABS: 20.0000 mg | ORAL_TABLET | Freq: Every day | ORAL | Status: DC

## 2015-08-08 MED ORDER — AMOXICILLIN-POT CLAVULANATE 875-125 MG PO TABS: 1.0000 | ORAL_TABLET | Freq: Two times a day (BID) | ORAL | Status: DC
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
## 2015-08-08 NOTE — Assessment & Plan Note (Signed)
ugmentin 875mg  Twice daily  For 10 days -take with food Eat yogurt.  Follow up with ENT soon for ear issues.  Continue on Dulera 2 puffs Twice daily  , rinse after use.  Continue on Spiriva 2 puffs daily  Continue Prednisone 10mg  `1/2 daily .  Follow med calendar closely and bring to each visit.  Please contact office for sooner follow up if symptoms do not improve or worsen or seek emergency care  Follow up Dr. Melvyn Novas in 3 months and As needed

## 2015-08-08 NOTE — Patient Instructions (Addendum)
Augmentin 875mg  Twice daily  For 10 days -take with food Eat yogurt.  Follow up with ENT soon for ear issues.  Continue on Dulera 2 puffs Twice daily  , rinse after use.  Continue on Spiriva 2 puffs daily  Continue Prednisone 10mg  `1/2 daily .  Follow med calendar closely and bring to each visit.  Please contact office for sooner follow up if symptoms do not improve or worsen or seek emergency care  Follow up Dr. Melvyn Novas in 3 months and As needed

## 2015-08-10 ENCOUNTER — Telehealth: Payer: Self-pay | Admitting: Internal Medicine

## 2015-08-10 NOTE — Telephone Encounter (Signed)
LMTCB-no DPR for daughter-can discuss with patient only.

## 2015-08-13 ENCOUNTER — Telehealth: Payer: Self-pay | Admitting: Internal Medicine

## 2015-08-13 MED ORDER — TIOTROPIUM BROMIDE MONOHYDRATE 2.5 MCG/ACT IN AERS: 2.0000 | INHALATION_SPRAY | RESPIRATORY_TRACT | Status: DC

## 2015-08-13 NOTE — Telephone Encounter (Signed)
Called spoke with patient and discussed refill needed and verified pharmacy Refill telephoned to [facility] Nothing further needed; will sign off

## 2015-08-13 NOTE — Telephone Encounter (Signed)
Spoke with pt's daughter. Advised her that she is not on his DPR form. She will have him call back about this prescription refill.

## 2015-08-13 NOTE — Telephone Encounter (Signed)
Pt calling back a refill for sprivia can be reached @ [phone].[name]

## 2015-08-13 NOTE — Telephone Encounter (Signed)
Spiriva 2.42mcg was initially added by MW at the 1.10.17 visit There is no strength on the 4.19.17 med calendar [facility] TCB x1 for pt to verify the strength on the medication he has at home

## 2015-08-14 DIAGNOSIS — H6691 Otitis media, unspecified, right ear: Secondary | ICD-10-CM | POA: Insufficient documentation

## 2015-08-14 DIAGNOSIS — H6690 Otitis media, unspecified, unspecified ear: Secondary | ICD-10-CM | POA: Insufficient documentation

## 2015-08-14 NOTE — Telephone Encounter (Signed)
lmtcb X2 for pt.  

## 2015-08-14 NOTE — Assessment & Plan Note (Signed)
Recent flare resolving  Patient's medications were reviewed today and patient education was given. Computerized medication calendar was adjusted/completed;l   Plan  Continue on Dulera 2 puffs Twice daily  , rinse after use.  Continue on Spiriva 2 puffs daily  Continue Prednisone 10mg  `1/2 daily .  Follow med calendar closely and bring to each visit.  Please contact office for sooner follow up if symptoms do not improve or worsen or seek emergency care  Follow up Dr. Melvyn Novas in 3 months and As needed

## 2015-08-14 NOTE — Assessment & Plan Note (Signed)
Augmentin 875mg  Twice daily  For 10 days -take with food Eat yogurt.  Follow up with ENT soon for ear issues.  Follow med calendar closely and bring to each visit.  Please contact office for sooner follow up if symptoms do not improve or worsen or seek emergency care  Follow up Dr. Melvyn Novas in 3 months and As needed

## 2015-08-15 NOTE — Telephone Encounter (Signed)
Called spoke with patient - he is not at home but will call and have someone verify the strength on his Spiriva Resp and call the office back.  Advised pt he may also send a mychart email if that would be more convenient.  Will await pt's return call.

## 2015-08-15 NOTE — Telephone Encounter (Signed)
Called spoke with [facility] and informed her of spiriva strength. She voiced understanding and had no further questions. Nothing further needed.

## 2015-08-15 NOTE — Telephone Encounter (Signed)
Dawn with health dept pharmacy returning call  [phone]

## 2015-08-15 NOTE — Telephone Encounter (Signed)
LVM for Dawn to return call.

## 2015-08-15 NOTE — Telephone Encounter (Signed)
1.52 is his sprivia [phone] pt

## 2015-09-10 ENCOUNTER — Ambulatory Visit: Payer: No Typology Code available for payment source | Admitting: Internal Medicine

## 2015-09-18 ENCOUNTER — Other Ambulatory Visit: Payer: Self-pay

## 2015-09-18 DIAGNOSIS — I2510 Atherosclerotic heart disease of native coronary artery without angina pectoris: Secondary | ICD-10-CM

## 2015-09-18 MED ORDER — CLOPIDOGREL BISULFATE 75 MG PO TABS: 75.0000 mg | ORAL_TABLET | ORAL | Status: DC

## 2015-10-09 ENCOUNTER — Other Ambulatory Visit: Payer: Self-pay | Admitting: *Deleted

## 2015-10-09 DIAGNOSIS — I2510 Atherosclerotic heart disease of native coronary artery without angina pectoris: Secondary | ICD-10-CM

## 2015-10-09 MED ORDER — CLOPIDOGREL BISULFATE 75 MG PO TABS: 75.0000 mg | ORAL_TABLET | ORAL | Status: DC

## 2015-10-09 NOTE — Telephone Encounter (Signed)
clopidogrel (PLAVIX) 75 MG tablet  Medication   Date: 09/18/2015  Department: [address] Office  Ordering/Authorizing: Burnell Blanks, MD      Order Providers    Prescribing Provider Encounter Provider   Burnell Blanks, MD Velna Ochs, CMA    Medication Detail      Disp Refills Start End     clopidogrel (PLAVIX) 75 MG tablet 30 tablet 7 09/18/2015     Sig - Route: Take 1 tablet (75 mg total) by mouth every morning. - Oral    Class: Print     Associated Diagnoses    Atherosclerosis of native coronary artery of native heart without angina pectoris - [place]

## 2015-10-26 ENCOUNTER — Other Ambulatory Visit: Payer: Self-pay

## 2015-10-26 DIAGNOSIS — I2510 Atherosclerotic heart disease of native coronary artery without angina pectoris: Secondary | ICD-10-CM

## 2015-10-26 MED ORDER — CLOPIDOGREL BISULFATE 75 MG PO TABS: 75.0000 mg | ORAL_TABLET | ORAL | Status: DC

## 2015-10-26 NOTE — Telephone Encounter (Signed)
clopidogrel (PLAVIX) 75 MG tablet  Medication   Date: 10/09/2015  Department: [place] Office  Ordering/Authorizing: Burnell Blanks, MD      Order Providers    Prescribing Provider Encounter Provider   Burnell Blanks, MD Roberts Gaudy, CMA    Medication Detail      Disp Refills Start End     clopidogrel (PLAVIX) 75 MG tablet 30 tablet 7 10/09/2015     Sig - Route: Take 1 tablet (75 mg total) by mouth every morning. - Oral    Class: Print     Associated Diagnoses    Atherosclerosis of native coronary artery of native heart without angina pectoris - [place] currently on file with [facility]

## 2015-10-29 ENCOUNTER — Other Ambulatory Visit: Payer: Self-pay | Admitting: Internal Medicine

## 2015-10-29 MED ORDER — ALBUTEROL SULFATE HFA 108 (90 BASE) MCG/ACT IN AERS: 2.0000 | INHALATION_SPRAY | RESPIRATORY_TRACT | Status: DC | PRN

## 2015-11-07 ENCOUNTER — Ambulatory Visit: Payer: Self-pay | Admitting: Internal Medicine

## 2015-11-09 ENCOUNTER — Telehealth: Payer: Self-pay | Admitting: *Deleted

## 2015-11-09 NOTE — Telephone Encounter (Signed)
Patient lost original Rx and I called into the health dept to give a verbal order according to the written order on 10/26/2015.

## 2015-11-15 ENCOUNTER — Ambulatory Visit: Payer: Self-pay

## 2015-12-04 ENCOUNTER — Ambulatory Visit: Payer: Self-pay | Admitting: Internal Medicine

## 2015-12-07 ENCOUNTER — Ambulatory Visit: Payer: Self-pay | Admitting: Internal Medicine

## 2015-12-21 ENCOUNTER — Encounter: Payer: Self-pay | Admitting: Internal Medicine

## 2015-12-21 ENCOUNTER — Ambulatory Visit (INDEPENDENT_AMBULATORY_CARE_PROVIDER_SITE_OTHER): Payer: Self-pay | Admitting: Internal Medicine

## 2015-12-21 ENCOUNTER — Ambulatory Visit: Payer: Self-pay | Admitting: Internal Medicine

## 2015-12-21 VITALS — BP 122/80 | HR 80 | Ht 67.0 in | Wt 166.0 lb

## 2015-12-21 DIAGNOSIS — Z23 Encounter for immunization: Secondary | ICD-10-CM

## 2015-12-21 DIAGNOSIS — J449 Chronic obstructive pulmonary disease, unspecified: Secondary | ICD-10-CM

## 2015-12-21 LAB — NITRIC OXIDE: Nitric Oxide: 9

## 2015-12-21 NOTE — Patient Instructions (Addendum)
When breathing better and don't need your rescue albuterol more than you are now, try the prednisone 10 mg one half daily  See calendar for specific medication instructions and bring it back for each and every office visit for every healthcare provider you see.  Without it,  you may not receive the best quality medical care that we feel you deserve.  You will note that the calendar groups together  your maintenance  medications that are timed at particular times of the day.  Think of this as your checklist for what your doctor has instructed you to do until your next evaluation to see what benefit  there is  to staying on a consistent group of medications intended to keep you well.  The other group at the bottom is entirely up to you to use as you see fit  for specific symptoms that may arise between visits that require you to treat them on an as needed basis.  Think of this as your action plan or "what if" list.   Separating the top medications from the bottom group is fundamental to providing you adequate care going forward.    Please schedule a follow up visit in 3 months but call sooner if needed  Late add: abd complaints may be related to use of spiriva > try off x two weeks to see if makes any difference either with sob or abd swelling
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
## 2015-12-22 ENCOUNTER — Encounter: Payer: Self-pay | Admitting: Internal Medicine

## 2015-12-22 NOTE — Assessment & Plan Note (Addendum)
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 2008  - PFT's 2007  FEV1  1.78 (50%) ratio 52 and 19% resp to saba, dlco 75% - d/c coreg 09/03/2013  - spirometry 12/18/2014 >  FEV1 1.04 (34%) ratio 53     > trial of dulera 200 2bid and prednisone 5 mg daily   - Spiriva respimat added 05/01/2015  - max gerd rx1/01/2016 > improved 06/12/2015  - 07/17/2015  extensive coaching HFA effectiveness =    90%  - FENO 12/21/2015  =   9  p am symb 160 rx - Spirometry 12/21/2015  FEV1 1.33 (47%)  Ratio 53 p am symb 160/spiriva    Despite severe airflow obst , maintaining very good ex tol and the low feno suggests that the inflammatory component may be over treated at this point on max dose ics/ pred 10   The goal with a chronic steroid dependent illness is always arriving at the lowest effective dose that controls the disease/symptoms and not accepting a set "formula" which is based on statistics or guidelines that don't always take into account patient  variability or the natural hx of the dz in every individual patient, which may well vary over time.  For now therefore I recommend the patient maintain  A new floor of 5 mg daily  In addition, his ex tol is actually better than I would have predicted given severity of dz and may not really need LAMA esp with abd complaints that may be related to anticholinergic systemic side effects that have develped on spiriva so rec trial off   I had an extended discussion with the patient reviewing all relevant studies completed to date and  lasting 15 to 20 minutes of a 25 minute visit    Each maintenance medication was reviewed in detail including most importantly the difference between maintenance and prns and under what circumstances the prns are to be triggered using an action plan format that is not reflected in the computer generated alphabetically organized AVS but trather by a customized med calendar that reflects the AVS meds with confirmed 100% correlation.    Please see instructions for details which were reviewed in writing and the patient given a copy highlighting the part that I personally wrote and discussed at today's ov.

## 2015-12-25 ENCOUNTER — Telehealth: Payer: Self-pay | Admitting: *Deleted

## 2015-12-25 ENCOUNTER — Encounter: Payer: Self-pay | Admitting: *Deleted

## 2015-12-25 NOTE — Telephone Encounter (Signed)
LMTCB for the pt 

## 2015-12-25 NOTE — Telephone Encounter (Signed)
lmtcb

## 2015-12-25 NOTE — Telephone Encounter (Signed)
-----   Message from Tanda Rockers, MD sent at 12/22/2015  7:28 AM EDT -----  abd complaints may be related to use of spiriva > try off x two weeks to see if makes any difference either with sob or abd swelling

## 2015-12-27 NOTE — Telephone Encounter (Signed)
lmtcb

## 2016-02-15 ENCOUNTER — Other Ambulatory Visit: Payer: Self-pay | Admitting: Adult Health

## 2016-02-15 MED ORDER — MOMETASONE FUROATE 50 MCG/ACT NA SUSP: 2.0000 | Freq: Every day | NASAL | 3 refills | Status: DC | PRN

## 2016-03-20 ENCOUNTER — Other Ambulatory Visit: Payer: Self-pay | Admitting: *Deleted

## 2016-03-20 MED ORDER — METFORMIN HCL 1000 MG PO TABS: 1000.0000 mg | ORAL_TABLET | Freq: Two times a day (BID) | ORAL | 11 refills | Status: DC

## 2016-03-21 ENCOUNTER — Telehealth: Payer: Self-pay | Admitting: Adult Health

## 2016-03-21 ENCOUNTER — Ambulatory Visit: Payer: Self-pay | Admitting: Internal Medicine

## 2016-03-21 ENCOUNTER — Other Ambulatory Visit: Payer: Self-pay | Admitting: Adult Health

## 2016-03-21 MED ORDER — FAMOTIDINE 20 MG PO TABS: 20.0000 mg | ORAL_TABLET | Freq: Every day | ORAL | 5 refills | Status: DC

## 2016-03-21 MED ORDER — PANTOPRAZOLE SODIUM 40 MG PO TBEC: 40.0000 mg | DELAYED_RELEASE_TABLET | Freq: Every day | ORAL | 5 refills | Status: DC

## 2016-03-21 NOTE — Telephone Encounter (Signed)
LMOMTCB x 1 

## 2016-03-21 NOTE — Telephone Encounter (Signed)
Call back to check on status of verifing scrpt.[name]'

## 2016-03-24 ENCOUNTER — Ambulatory Visit: Payer: Self-pay | Attending: Internal Medicine

## 2016-03-24 NOTE — Telephone Encounter (Signed)
[facility]. I have verified the pt's Pepcid and Pantoprazole prescriptions. Nothing further was needed.

## 2016-03-24 NOTE — Telephone Encounter (Signed)
[facility] return phone call. Contact # [phone].[name]

## 2016-04-07 ENCOUNTER — Ambulatory Visit (INDEPENDENT_AMBULATORY_CARE_PROVIDER_SITE_OTHER): Payer: No Typology Code available for payment source | Admitting: Internal Medicine

## 2016-04-07 ENCOUNTER — Encounter: Payer: Self-pay | Admitting: Internal Medicine

## 2016-04-07 ENCOUNTER — Ambulatory Visit: Payer: Self-pay | Admitting: Internal Medicine

## 2016-04-07 VITALS — BP 134/84 | HR 85 | Ht 68.0 in | Wt 162.0 lb

## 2016-04-07 DIAGNOSIS — J449 Chronic obstructive pulmonary disease, unspecified: Secondary | ICD-10-CM
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
## 2016-04-07 NOTE — Patient Instructions (Addendum)
No change in medications - you are doing well with self management using the med calendar format  See calendar for specific medication instructions and bring it back for each and every office visit for every healthcare provider you see.  Without it,  you may not receive the best quality medical care that we feel you deserve.  You will note that the calendar groups together  your maintenance  medications that are timed at particular times of the day.  Think of this as your checklist for what your doctor has instructed you to do until your next evaluation to see what benefit  there is  to staying on a consistent group of medications intended to keep you well.  The other group at the bottom is entirely up to you to use as you see fit  for specific symptoms that may arise between visits that require you to treat them on an as needed basis.  Think of this as your action plan or "what if" list.   Separating the top medications from the bottom group is fundamental to providing you adequate care going forward.   Please schedule a follow up visit in 6  months but call sooner if needed

## 2016-04-07 NOTE — Assessment & Plan Note (Signed)
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 2008  - PFT's 2007  FEV1  1.78 (50%) ratio 52 and 19% resp to saba, dlco 75% - d/c coreg 09/03/2013  - spirometry 12/18/2014 >  FEV1 1.04 (34%) ratio 53     > trial of dulera 200 2bid and prednisone 5 mg daily   - Spiriva respimat added 05/01/2015  - max gerd rx1/01/2016 > improved 06/12/2015  - 07/17/2015  extensive coaching HFA effectiveness =    90%  - FENO 12/21/2015  =   9  p am symb 160 rx - Spirometry 12/21/2015  FEV1 1.33 (47%)  Ratio 53 p am symb 160/spiriva   He has moderately severe dz / steroid dep x years but has found the lowest effective dose and doing well using the med calendar/ self management so no changes needed    Each maintenance medication was reviewed in detail including most importantly the difference between maintenance and as needed and under what circumstances the prns are to be used. This was done in the context of a medication calendar review which provided the patient with a user-friendly unambiguous mechanism for medication administration and reconciliation and provides an action plan for all active problems. It is critical that this be shown to every doctor  for modification during the office visit if necessary so the patient can use it as a working document.

## 2016-04-21 DIAGNOSIS — H269 Unspecified cataract: Secondary | ICD-10-CM

## 2016-04-21 HISTORY — DX: Unspecified cataract: H26.9

## 2016-04-25 ENCOUNTER — Other Ambulatory Visit: Payer: Self-pay

## 2016-05-06 ENCOUNTER — Ambulatory Visit (INDEPENDENT_AMBULATORY_CARE_PROVIDER_SITE_OTHER): Payer: No Typology Code available for payment source | Admitting: Family Medicine

## 2016-05-06 ENCOUNTER — Encounter: Payer: Self-pay | Admitting: Family Medicine

## 2016-05-06 VITALS — BP 122/68 | HR 100 | Temp 98.4°F | Ht 68.0 in | Wt 165.4 lb

## 2016-05-06 DIAGNOSIS — H663X1 Other chronic suppurative otitis media, right ear: Secondary | ICD-10-CM

## 2016-05-06 DIAGNOSIS — E119 Type 2 diabetes mellitus without complications: Secondary | ICD-10-CM

## 2016-05-06 DIAGNOSIS — Z23 Encounter for immunization: Secondary | ICD-10-CM

## 2016-05-06 LAB — POCT GLYCOSYLATED HEMOGLOBIN (HGB A1C): Hemoglobin A1C: 7.4

## 2016-05-06 MED ORDER — AMOXICILLIN-POT CLAVULANATE 875-125 MG PO TABS: 1.0000 | ORAL_TABLET | Freq: Two times a day (BID) | ORAL | 0 refills | Status: DC

## 2016-05-06 MED ORDER — OFLOXACIN 0.3 % OP SOLN: 2.0000 [drp] | Freq: Four times a day (QID) | OPHTHALMIC | 0 refills | Status: DC

## 2016-05-06 NOTE — Patient Instructions (Signed)
Thank you so much for coming to visit today! I suspect you have an ear infection of your right ear. I have prescribed Augmentin to take twice daily for 10 days. I have also prescribed an ear drop to use up to four times daily for four days. I have placed a referral to ENT.  Please follow up with your PCP.  Dr. Gerlean Ren

## 2016-05-06 NOTE — Addendum Note (Signed)
Addended by: Londell Moh T on: 05/06/2016 06:06 PM   Modules accepted: Orders, SmartSet

## 2016-05-06 NOTE — Progress Notes (Signed)
Subjective:     Patient ID: [name], male   DOB: 1960/12/25, 56 y.o.   MRN: [id number]  HPI [name] is a 56 year old male presenting today for ear pain. Notes 2 weeks history of right ear pain yellow drainage. Also notes mild drainage from left ear but denies pain. Denies fever. Notes some morning nasal congestion but this resolves throughout the day. Reports chronic ear problems, previously followed by Dr. Obie Dredge ENT. Requests to reestablish with ENT. Reports sinus surgery which he believes is in 2008. Notes he usually requires a prescription for both oral antibiotics and antibiotic eardrops before this resolves. Former smoker   Review of Systems Per HPI    Objective:   Physical Exam  Constitutional: He appears well-developed and well-nourished. No distress.  HENT:  Head: Normocephalic and atraumatic.  Left tympanic membrane with no signs of infection. Right tympanic membrane with perforation with white drainage.  Cardiovascular: Normal rate and regular rhythm.   No murmur heard. Pulmonary/Chest: Effort normal. No respiratory distress. He has no wheezes.  Abdominal: Soft. He exhibits no distension. There is no tenderness.  Skin: No rash noted.  Psychiatric: He has a normal mood and affect. His behavior is normal.      Assessment and Plan:     1. 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 Referral to ENT placed. Follow-up with PCP.

## 2016-06-04 ENCOUNTER — Other Ambulatory Visit: Payer: Self-pay

## 2016-06-04 MED ORDER — MONTELUKAST SODIUM 10 MG PO TABS: 10.0000 mg | ORAL_TABLET | Freq: Every day | ORAL | 5 refills | Status: DC

## 2016-06-04 NOTE — Addendum Note (Signed)
Addended by: Parke Poisson E on: 06/04/2016 05:38 PM   Modules accepted: Orders

## 2016-06-04 NOTE — Telephone Encounter (Addendum)
Rx was printed in error Unable to send electronically to the [facility] - telephoned Rx in and left message Will sign off

## 2016-07-29 ENCOUNTER — Other Ambulatory Visit: Payer: Self-pay | Admitting: Internal Medicine

## 2016-07-29 MED ORDER — PREDNISONE 5 MG PO TABS: 5.0000 mg | ORAL_TABLET | Freq: Every day | ORAL | 1 refills | Status: DC

## 2016-08-05 ENCOUNTER — Telehealth: Payer: Self-pay | Admitting: Internal Medicine

## 2016-08-05 MED ORDER — PREDNISONE 5 MG PO TABS: 5.0000 mg | ORAL_TABLET | Freq: Every day | ORAL | 1 refills | Status: DC

## 2016-08-05 NOTE — Telephone Encounter (Signed)
Spoke with the pt and notified that rx was sent  Nothing further needed 

## 2016-08-05 NOTE — Telephone Encounter (Signed)
Rx sent to pharm (Had to fax it)  ATC the pt, someone answered and then hung up, I called back x 2 and line busy

## 2016-08-06 ENCOUNTER — Telehealth: Payer: Self-pay | Admitting: Internal Medicine

## 2016-08-06 NOTE — Telephone Encounter (Signed)
Spoke with patient-he needs Prednisone Rx called to [facility]. Pt is aware that we have had issues with our phone/fax system. I have called Rx to pharmacy voicemail. Nothing more needed at this time.

## 2016-09-01 ENCOUNTER — Other Ambulatory Visit: Payer: Self-pay | Admitting: Cardiovascular Disease

## 2016-09-01 DIAGNOSIS — I2510 Atherosclerotic heart disease of native coronary artery without angina pectoris: Secondary | ICD-10-CM

## 2016-09-01 MED ORDER — ATORVASTATIN CALCIUM 40 MG PO TABS
40.0000 mg | ORAL_TABLET | Freq: Every day | ORAL | 0 refills | Status: DC
Start: 2016-09-01 — End: 2016-12-12

## 2016-09-01 NOTE — Telephone Encounter (Signed)
Pt's medication of Atorvastatin 80 mg tablets was faxed to pt's pharmacy at [place] [phone]. Confirmation received.

## 2016-10-06 ENCOUNTER — Ambulatory Visit: Payer: Self-pay | Admitting: Internal Medicine

## 2016-10-14 ENCOUNTER — Ambulatory Visit: Payer: Self-pay

## 2016-12-12 ENCOUNTER — Encounter: Payer: Self-pay | Admitting: Internal Medicine

## 2016-12-12 ENCOUNTER — Ambulatory Visit (INDEPENDENT_AMBULATORY_CARE_PROVIDER_SITE_OTHER): Payer: No Typology Code available for payment source | Admitting: Internal Medicine

## 2016-12-12 VITALS — BP 128/78 | HR 103 | Ht 68.0 in | Wt 163.0 lb

## 2016-12-12 DIAGNOSIS — J449 Chronic obstructive pulmonary disease, unspecified: Secondary | ICD-10-CM

## 2016-12-12 DIAGNOSIS — I2510 Atherosclerotic heart disease of native coronary artery without angina pectoris: Secondary | ICD-10-CM

## 2016-12-12 MED ORDER — ATORVASTATIN CALCIUM 40 MG PO TABS: 40.0000 mg | ORAL_TABLET | Freq: Every day | ORAL | 0 refills | Status: DC

## 2016-12-12 MED ORDER — PREDNISONE 5 MG PO TABS: 5.0000 mg | ORAL_TABLET | Freq: Every day | ORAL | 2 refills | Status: DC

## 2016-12-12 MED ORDER — TIOTROPIUM BROMIDE MONOHYDRATE 2.5 MCG/ACT IN AERS: 2.0000 | INHALATION_SPRAY | RESPIRATORY_TRACT | 11 refills | Status: DC
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
## 2016-12-12 NOTE — Patient Instructions (Signed)
Spiriva is 2 pffs each am   Prednisone 10 mg one daily until better then 5 mg daily    If you are satisfied with your treatment plan,  let your doctor know and he/she can either refill your medications or you can return here when your prescription runs out.     If in any way you are not 100% satisfied,  please tell us.  If 100% better, tell your friends!  Pulmonary follow up is as needed   - bring your medication calendar with you

## 2016-12-13 NOTE — Assessment & Plan Note (Addendum)
Pt requested one refill on lipitor pending f/u with PCP planned> one refill done

## 2016-12-13 NOTE — Assessment & Plan Note (Addendum)
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 2008  - PFT's 2007  FEV1  1.78 (50%) ratio 52 and 19% resp to saba, dlco 75% - d/c coreg 09/03/2013  - spirometry 12/18/2014 >  FEV1 1.04 (34%) ratio 53     > trial of dulera 200 2bid and prednisone 5 mg daily   - Spiriva respimat added 05/01/2015  - max gerd rx1/01/2016 > improved 06/12/2015   - FENO 12/21/2015  =   9  p am symb 160 rx - Spirometry 12/21/2015  FEV1 1.33 (47%)  Ratio 53 p am symb 160/spiriva   - 12/12/2016  After extensive coaching HFA effectiveness =    90% from a baseline of 75%    According to his hx he was doing fine on just singulair until stopped the spiriva suggesting more of a fixed airway obstruction pattern from copd or asthma with remodeling and not much bronchospasm/ airways inflammation on a relatively low dose of pred for life so fine with me to challenge with just lama/ prn saba and pred ceiling 10 and floor 5 mg daily   I had an extended discussion with the patient reviewing all relevant studies completed to date and  lasting 15 to 20 minutes of a 25 minute visit    Each maintenance medication was reviewed in detail including most importantly the difference between maintenance and prns and under what circumstances the prns are to be triggered using an action plan format that is not reflected in the computer generated alphabetically organized AVS.    Please see AVS for specific instructions unique to this visit that I personally wrote and verbalized to the the pt in detail and then reviewed with pt  by my nurse highlighting any  changes in therapy recommended at today's visit to their plan of care.

## 2017-03-25 ENCOUNTER — Encounter: Payer: Self-pay | Admitting: Internal Medicine

## 2017-03-25 ENCOUNTER — Other Ambulatory Visit: Payer: Self-pay

## 2017-03-25 ENCOUNTER — Ambulatory Visit: Payer: Self-pay | Admitting: Internal Medicine

## 2017-03-25 DIAGNOSIS — J441 Chronic obstructive pulmonary disease with (acute) exacerbation: Secondary | ICD-10-CM

## 2017-03-25 MED ORDER — PANTOPRAZOLE SODIUM 40 MG PO TBEC: 40.0000 mg | DELAYED_RELEASE_TABLET | Freq: Every day | ORAL | 0 refills | Status: DC

## 2017-03-25 MED ORDER — LEVOFLOXACIN 500 MG PO TABS: 500.0000 mg | ORAL_TABLET | Freq: Every day | ORAL | 0 refills | Status: DC

## 2017-03-25 MED ORDER — HYDROCODONE-HOMATROPINE 5-1.5 MG/5ML PO SYRP: 5.0000 mL | ORAL_SOLUTION | Freq: Three times a day (TID) | ORAL | 0 refills | Status: DC | PRN

## 2017-03-25 MED ORDER — PREDNISONE 20 MG PO TABS: ORAL_TABLET | ORAL | 0 refills | Status: DC

## 2017-03-25 MED ORDER — PANTOPRAZOLE SODIUM 40 MG PO TBEC: 40.0000 mg | DELAYED_RELEASE_TABLET | Freq: Every day | ORAL | 5 refills | Status: DC

## 2017-03-25 MED ORDER — TIOTROPIUM BROMIDE MONOHYDRATE 2.5 MCG/ACT IN AERS: 2.0000 | INHALATION_SPRAY | RESPIRATORY_TRACT | 11 refills | Status: DC

## 2017-03-25 MED ORDER — METFORMIN HCL 1000 MG PO TABS: 1000.0000 mg | ORAL_TABLET | Freq: Two times a day (BID) | ORAL | 11 refills | Status: DC

## 2017-03-25 NOTE — Patient Instructions (Addendum)
It was nice meeting you today Mr. Massey!  Please begin taking the antibiotic (levaquin) one tablet daily for the next 7 days.  Please also begin the 7 day prednisone course I have prescribed today. DO NOT take your usual 10 mg of prednisone in addition to the prednisone I have prescribed today. When you finish the 7 day prednisone course, you can resume your normal 10 mg daily prednisone.   Please continue using your regular inhalers and taking your regular medications as prescribed.   You can use the Hycodan cough syrup as needed up to every 8 hours for cough. This will make you sleepy, so do not drive after taking this medicine.   If you are not getting better after a few days of antibiotic, please let us know. If you begin to have difficulty breathing or feel you are getting worse, either call our office or go to the emergency room.   If you have any questions or concerns, please feel free to call the clinic.   Be well,  Dr. Avon Gully

## 2017-03-25 NOTE — Progress Notes (Signed)
   Subjective:   Patient: [name]       Birthdate: [birth year]       MRN: [id number]      HPI  [name] is a 56 y.o. male presenting for same day appt for cough and wheezing.   Cough, wheezing Began 10d ago and getting worse. Tmax 100F. Cough productive of yellow to dark brown sputum; no blood in sputum. Also with SOB, nasal congestion, and sore throat. Reports a few episodes of ear drainage on the L. Has hearing issues at baseline so can't say if his hearing is worse or not. Symptoms are worse at night and are preventing him from sleeping. Denies change in appetite. Denies sick contacts. Has been using his rescue albuterol inhaler more often than usual. Can no longer use his nebulizer machine because it is broken. Has been out of Spiriva for four days which he thinks is contributing to his problem. Has been taking Singulair and Anoro as prescribed. Is also taking his regular daily prednisone 10mg  as prescribed by his pulmonologist Dr. Melvyn Novas.    Smoking status reviewed. Patient is former smoker.   Review of Systems See HPI.     Objective:  Physical Exam  Constitutional: He is oriented to person, place, and time and well-developed, well-nourished, and in no distress.  HENT:  Head: Normocephalic and atraumatic.  Nose: Nose normal.  Mouth/Throat: Oropharynx is clear and moist. No oropharyngeal exudate.  Hole in L TM patient says 2/2 tympanostomy tube placement No drainage noted in either ear.   Eyes: Conjunctivae and EOM are normal. Pupils are equal, round, and reactive to light. Right eye exhibits no discharge. Left eye exhibits no discharge.  Neck: Normal range of motion. Neck supple. No thyromegaly present.  Cardiovascular: Normal rate, regular rhythm and normal heart sounds.  No murmur heard. Pulmonary/Chest:  Normal WOB on RA. Able to speak in full sentences. Significant wheezing bilaterally. Coughing repeatedly throughout encounter.   Lymphadenopathy:    He has no cervical  adenopathy.  Neurological: He is alert and oriented to person, place, and time.  Skin: Skin is warm and dry.  Psychiatric: Affect and judgment normal.      Assessment & Plan:  COPD exacerbation ([place]) With worsening cough and increased sputum production, as well as some shortness of breath and wheezing. Likely exacerbated by the fact that patient has not had his home Spiriva for four days and is no longer able to use his nebulizer as the machine is broken. O2 sat 95%, normal WOB on RA, and non-toxic in appearance, so feel that outpatient management is appropriate at this time.  - Levaquin 500mg  qd x7d - Will give prednisone burst (60mg  x2d, 40mg x2d, 20mg x3d), then patient to resume normal home prednisone 10mg  qd - Refilled Spiriva today - Encouraged patient to look into having his nebulizer fixed or getting a new machine - Return precautions discussed   Adin Hector, MD, MPH PGY-3 [facility] Pager [phone]

## 2017-03-26 ENCOUNTER — Other Ambulatory Visit: Payer: Self-pay | Admitting: Family Medicine

## 2017-03-26 ENCOUNTER — Telehealth: Payer: Self-pay | Admitting: *Deleted

## 2017-03-26 MED ORDER — TELMISARTAN 20 MG PO TABS: 20.0000 mg | ORAL_TABLET | Freq: Every day | ORAL | 0 refills | Status: DC

## 2017-03-26 MED ORDER — LOSARTAN POTASSIUM 50 MG PO TABS: 50.0000 mg | ORAL_TABLET | Freq: Every day | ORAL | 3 refills | Status: DC

## 2017-03-26 NOTE — Telephone Encounter (Signed)
Received message on nurse line from [facility] with [facility] [phone]) requesting refill on lisinopril. Not on patient's current med list. Please advise. Hubbard Hartshorn, RN, BSN

## 2017-03-26 NOTE — Assessment & Plan Note (Signed)
With worsening cough and increased sputum production, as well as some shortness of breath and wheezing. Likely exacerbated by the fact that patient has not had his home Spiriva for four days and is no longer able to use his nebulizer as the machine is broken. O2 sat 95%, normal WOB on RA, and non-toxic in appearance, so feel that outpatient management is appropriate at this time.  - Levaquin 500mg  qd x7d - Will give prednisone burst (60mg  x2d, 40mg x2d, 20mg x3d), then patient to resume normal home prednisone 10mg  qd - Refilled Spiriva today - Encouraged patient to look into having his nebulizer fixed or getting a new machine - Return precautions discussed

## 2017-03-26 NOTE — Telephone Encounter (Addendum)
Contacted [name] to make her aware of change. States they are unable to get micardis at no cost. Equivalent would be losartan 50 mg daily. Please advise. Hubbard Hartshorn, RN, BSN

## 2017-03-26 NOTE — Telephone Encounter (Signed)
Patient instructed to d/c lisinopril and switch to Micardis per pulmonologist notes. Refill placed for Micardis 20 mg daily.

## 2017-03-26 NOTE — Telephone Encounter (Signed)
Removed Micardis from med list and prescribed losartan 50 mg daily. Thanks for looking into this Lauren.

## 2017-03-26 NOTE — Telephone Encounter (Signed)
Losartan e-scribed to [facility] vs [facility]. Losartan called to [name] at [place] and cancelled at [place]. Hubbard Hartshorn, RN, BSN

## 2017-03-26 NOTE — Telephone Encounter (Signed)
Left message on [name]'s VM that this change has been made. Hubbard Hartshorn, RN, BSN

## 2017-03-28 ENCOUNTER — Ambulatory Visit (HOSPITAL_COMMUNITY)
Admission: EM | Admit: 2017-03-28 | Discharge: 2017-03-28 | Disposition: A | Discharge status: Home / Self Care | Payer: Self-pay

## 2017-03-28 ENCOUNTER — Emergency Department (HOSPITAL_COMMUNITY): Payer: Self-pay

## 2017-03-28 ENCOUNTER — Encounter (HOSPITAL_COMMUNITY): Payer: Self-pay | Admitting: Emergency Medicine

## 2017-03-28 ENCOUNTER — Ambulatory Visit (HOSPITAL_COMMUNITY)
Admission: EM | Admit: 2017-03-28 | Discharge: 2017-03-29 | Disposition: A | Discharge status: Home / Self Care | Payer: Self-pay | Attending: Emergency Medicine | Admitting: Emergency Medicine

## 2017-03-28 ENCOUNTER — Other Ambulatory Visit: Payer: Self-pay

## 2017-03-28 DIAGNOSIS — Z955 Presence of coronary angioplasty implant and graft: Secondary | ICD-10-CM | POA: Insufficient documentation

## 2017-03-28 DIAGNOSIS — B3781 Candidal esophagitis: Secondary | ICD-10-CM | POA: Insufficient documentation

## 2017-03-28 DIAGNOSIS — T18108A Unspecified foreign body in esophagus causing other injury, initial encounter: Secondary | ICD-10-CM

## 2017-03-28 DIAGNOSIS — E119 Type 2 diabetes mellitus without complications: Secondary | ICD-10-CM | POA: Insufficient documentation

## 2017-03-28 DIAGNOSIS — R1310 Dysphagia, unspecified: Secondary | ICD-10-CM | POA: Insufficient documentation

## 2017-03-28 DIAGNOSIS — E785 Hyperlipidemia, unspecified: Secondary | ICD-10-CM | POA: Insufficient documentation

## 2017-03-28 DIAGNOSIS — X58XXXA Exposure to other specified factors, initial encounter: Secondary | ICD-10-CM | POA: Insufficient documentation

## 2017-03-28 DIAGNOSIS — K219 Gastro-esophageal reflux disease without esophagitis: Secondary | ICD-10-CM | POA: Insufficient documentation

## 2017-03-28 DIAGNOSIS — Z7984 Long term (current) use of oral hypoglycemic drugs: Secondary | ICD-10-CM | POA: Insufficient documentation

## 2017-03-28 DIAGNOSIS — Z79899 Other long term (current) drug therapy: Secondary | ICD-10-CM | POA: Insufficient documentation

## 2017-03-28 DIAGNOSIS — Z7982 Long term (current) use of aspirin: Secondary | ICD-10-CM | POA: Insufficient documentation

## 2017-03-28 DIAGNOSIS — K222 Esophageal obstruction: Secondary | ICD-10-CM

## 2017-03-28 DIAGNOSIS — Z87891 Personal history of nicotine dependence: Secondary | ICD-10-CM | POA: Insufficient documentation

## 2017-03-28 DIAGNOSIS — T18198A Other foreign object in esophagus causing other injury, initial encounter: Secondary | ICD-10-CM | POA: Insufficient documentation

## 2017-03-28 DIAGNOSIS — I2510 Atherosclerotic heart disease of native coronary artery without angina pectoris: Secondary | ICD-10-CM | POA: Insufficient documentation

## 2017-03-28 DIAGNOSIS — I252 Old myocardial infarction: Secondary | ICD-10-CM | POA: Insufficient documentation

## 2017-03-28 LAB — CBC
HCT: 38.1 % — ABNORMAL LOW (ref 39.0–52.0)
Hemoglobin: 12.1 g/dL — ABNORMAL LOW (ref 13.0–17.0)
MCH: 20.9 pg — ABNORMAL LOW (ref 26.0–34.0)
MCHC: 31.8 g/dL (ref 30.0–36.0)
MCV: 65.8 fL — ABNORMAL LOW (ref 78.0–100.0)
PLATELETS: 252 10*3/uL (ref 150–400)
RBC: 5.79 MIL/uL (ref 4.22–5.81)
RDW: 18.3 % — AB (ref 11.5–15.5)
WBC: 17.5 10*3/uL — AB (ref 4.0–10.5)

## 2017-03-28 LAB — I-STAT TROPONIN, ED: Troponin i, poc: 0.01 ng/mL (ref 0.00–0.08)

## 2017-03-28 LAB — BASIC METABOLIC PANEL
Anion gap: 11 (ref 5–15)
BUN: 19 mg/dL (ref 6–20)
CO2: 18 mmol/L — ABNORMAL LOW (ref 22–32)
CREATININE: 1.02 mg/dL (ref 0.61–1.24)
Calcium: 9.5 mg/dL (ref 8.9–10.3)
Chloride: 108 mmol/L (ref 101–111)
GFR calc Af Amer: >60 mL/min (ref >60)
GLUCOSE: 242 mg/dL — AB (ref 65–99)
Potassium: 4.4 mmol/L (ref 3.5–5.1)
SODIUM: 137 mmol/L (ref 135–145)

## 2017-03-28 MED ORDER — SODIUM CHLORIDE 0.9 % IV SOLN
Freq: Once | INTRAVENOUS | Status: AC
  Administered 2017-03-28: 22:00:00 via INTRAVENOUS

## 2017-03-28 MED ORDER — ALBUTEROL SULFATE (2.5 MG/3ML) 0.083% IN NEBU
2.5000 mg | INHALATION_SOLUTION | Freq: Once | RESPIRATORY_TRACT | Status: AC
  Administered 2017-03-28: 2.5 mg via RESPIRATORY_TRACT
  Filled 2017-03-28: qty 3

## 2017-03-28 NOTE — ED Triage Notes (Signed)
Pt reports he has a Metformin pill stuck in throat. Has been there for past hr. Pt has attempted to drink and everything he swallows he spits back up. C/O sore throat.

## 2017-03-28 NOTE — ED Notes (Signed)
Patient with metformin pill stuck in throat. Patient reports difficulty swallowing salvia. Patient is talking in complete sentences. I examined patient and patient instructed to go to the emergency department for evaluation due to the nature of his complaint. Patient and son verbalized understanding.

## 2017-03-28 NOTE — ED Provider Notes (Signed)
[facility] EMERGENCY DEPARTMENT Provider Note   CSN: [id number] Arrival date & time: 03/28/17  1951     History   Chief Complaint Chief Complaint  Patient presents with  . Pill Stuck in Throat    HPI [name] is a 56 y.o. male with a history of asthma, DM 2, GERD, MI, COPD who presents today for evaluation of a reported metformin pill stuck in his throat.  He reports that about 2 hours ago he attempted to swallow his metformin pill.  He says that he did have some coughing and choking feeling and that it did not feel like a normal swallow.  He then reports that he feels like his pill is stuck in his throat around the middle of his chest.  He denies any history of this, states that he used to have feelings like he was choking in the past which was treated for GERD with relief.  He reports that he was not eating around this time.  He has been unable to swallow since.  He says that anytime he attempts to swallow liquids he has to cough it back up.  He reports that his asthma has been worse recently with the change of seasons.  He reports he was feeling slightly out of breath before this happened.      HPI  Past Medical History:  Diagnosis Date  . Asthma   . Asthma   . CAD (coronary artery disease)   . Cellulitis of leg, right 10/2014  . Dental caries   . Diabetes mellitus    TYPE 2  . GERD (gastroesophageal reflux disease)   . Hyperlipidemia   . Myocardial infarction ([place]) 2008  . Thyroid nodule    biopsy negative 2006    Patient Active Problem List   Diagnosis Date Noted  . Otitis media 08/14/2015  . COPD exacerbation ([place]) 07/12/2015  . Leg edema   . 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 Ankle pain   . Cellulitis of right lower extremity 11/08/2014  . Cataracts, bilateral 06/27/2014  . Spontaneous ecchymosis 06/26/2014  . Pain and swelling of left wrist 06/26/2014  . Otitis externa of right ear 07/28/2012  . Cardiomyopathy, ischemic 11/11/2011  .  Dental caries 08/06/2010  . Asthma 01/30/2010  . Diabetes ([place]) 06/12/2009  . Atherosclerosis of native coronary artery of native heart 07/13/2008  . History of MI (myocardial infarction) 04/01/2007  . HYPERTRIGLYCERIDEMIA 06/18/2006  . SINUSITIS, CHRONIC, NOS 06/18/2006    Past Surgical History:  Procedure Laterality Date  . CORONARY STENT PLACEMENT    . solitary pulm and thryoid nodules         Home Medications    Prior to Admission medications   Medication Sig Start Date End Date Taking? Authorizing Provider  albuterol (PROVENTIL HFA;VENTOLIN HFA) 108 (90 Base) MCG/ACT inhaler Inhale 2 puffs into the lungs every 4 (four) hours as needed for wheezing or shortness of breath (((PLAN B))). 10/29/15  Yes Tanda Rockers, MD  albuterol (PROVENTIL) (2.5 MG/3ML) 0.083% nebulizer solution Take 3 mLs (2.5 mg total) by nebulization every 4 (four) hours as needed for wheezing or shortness of breath. 07/12/15  Yes Mikell, Jeani Sow, MD  aspirin (ADULT ASPIRIN EC LOW STRENGTH) 81 MG EC tablet Take 81 mg by mouth every morning.    Yes [provider]  atorvastatin (LIPITOR) 40 MG tablet Take 1 tablet (40 mg total) by mouth at bedtime. Please call our office to schedule an overdue yearly appointment  before anymore refills. [phone]. Thank you 1st attempt 12/12/16  Yes Tanda Rockers, MD  bisoprolol (ZEBETA) 5 MG tablet Take 2.5 mg by mouth daily. Take 1/2 tablet by mouth every morning    Yes [provider]  calcium-vitamin D (OSCAL WITH D) 250-125 MG-UNIT tablet Take 1 tablet by mouth 2 (two) times daily.   Yes [provider]  clopidogrel (PLAVIX) 75 MG tablet Take 1 tablet (75 mg total) by mouth every morning. 10/26/15  Yes Burnell Blanks, MD  dextromethorphan (DELSYM) 30 MG/5ML liquid Take 2 tsp every 12 hours as needed for cough   Yes [provider]  HYDROcodone-homatropine (HYCODAN) 5-1.5 MG/5ML syrup Take 5 mLs by mouth every 8 (eight) hours as  needed for cough. 03/25/17  Yes Verner Mould, MD  lisinopril (PRINIVIL,ZESTRIL) 5 MG tablet Take 5 mg by mouth daily.   Yes [provider]  metFORMIN (GLUCOPHAGE) 1000 MG tablet Take 1 tablet (1,000 mg total) by mouth 2 (two) times daily with a meal. 03/25/17  Yes Verner Mould, MD  mometasone (NASONEX) 50 MCG/ACT nasal spray Place 2 sprays into the nose daily as needed (Allergies). 02/15/16  Yes Parrett, Tammy S, NP  montelukast (SINGULAIR) 10 MG tablet Take 1 tablet (10 mg total) by mouth at bedtime. 06/04/16  Yes Parrett, Tammy S, NP  Multiple Vitamin (MULTIVITAMIN) tablet Take 1 tablet by mouth every morning.    Yes [provider]  pantoprazole (PROTONIX) 40 MG tablet Take 1 tablet (40 mg total) by mouth daily. Take 30-60 min before first meal of the day 03/25/17  Yes Verner Mould, MD  predniSONE (DELTASONE) 5 MG tablet Take 1-2 tablets (5-10 mg total) by mouth daily with breakfast. Patient taking differently: Take 10 mg by mouth daily with breakfast.  12/12/16  Yes Tanda Rockers, MD  Tiotropium Bromide Monohydrate (SPIRIVA RESPIMAT) 2.5 MCG/ACT AERS Inhale 2 puffs into the lungs every morning. 03/25/17  Yes Verner Mould, MD  levofloxacin (LEVAQUIN) 500 MG tablet Take 1 tablet (500 mg total) by mouth daily. Patient not taking: Reported on 03/28/2017 03/25/17   Verner Mould, MD  losartan (COZAAR) 50 MG tablet Take 1 tablet (50 mg total) by mouth daily. Patient not taking: Reported on 03/28/2017 03/26/17   [place] Bing, DO    Family History Family History  Problem Relation Age of Onset  . Diabetes Mother   . Heart attack Brother 76    Social History Social History   Tobacco Use  . Smoking status: Former Smoker    Packs/day: 0.50    Years: 22.00    Pack years: 11.00    Types: Cigarettes    Last attempt to quit: 04/21/2006    Years since quitting: 10.9  . Smokeless tobacco: Never Used  . Tobacco comment:  quit 10 years ago  Substance Use Topics  . Alcohol use: No    Alcohol/week: 0.0 oz  . Drug use: No     Allergies   Patient has no known allergies.   Review of Systems Review of Systems  Constitutional: Negative for chills and fever.  HENT: Positive for trouble swallowing. Negative for ear pain and sore throat.   Eyes: Negative for pain and visual disturbance.  Respiratory: Negative for cough and shortness of breath.   Cardiovascular: Negative for chest pain and palpitations.  Gastrointestinal: Negative for abdominal pain and vomiting.  Genitourinary: Negative for dysuria and hematuria.  Musculoskeletal: Negative for arthralgias and back pain.  Skin:  Negative for color change and rash.  Neurological: Negative for seizures and syncope.  All other systems reviewed and are negative.    Physical Exam Updated Vital Signs BP (!) 145/84   Pulse 86   Temp 98.1 F (36.7 C) (Oral)   Resp 20   Ht 5\' 8"  (1.727 m)   Wt 72.6 kg (160 lb)   SpO2 96%   BMI 24.33 kg/m   Physical Exam  Constitutional: He is oriented to person, place, and time. He appears well-developed and well-nourished.  Patient is frequently belching, spitting up.  HENT:  Head: Normocephalic and atraumatic.  Mouth/Throat: Oropharynx is clear and moist.  Eyes: Conjunctivae are normal.  Neck: Normal range of motion. Neck supple. No JVD present. No tracheal deviation present.  Cardiovascular: Normal rate and regular rhythm.  No murmur heard. Pulmonary/Chest: Effort normal. No respiratory distress. He has wheezes. He exhibits no tenderness.  Abdominal: Soft. Normal appearance and bowel sounds are normal. He exhibits no distension. There is no tenderness.  Musculoskeletal: He exhibits no edema.  Neurological: He is alert and oriented to person, place, and time.  Skin: Skin is warm and dry.  Psychiatric: He has a normal mood and affect.  Nursing note and vitals reviewed.    ED Treatments / Results  Labs (all  labs ordered are listed, but only abnormal results are displayed) Labs Reviewed  BASIC METABOLIC PANEL - Abnormal; Notable for the following components:      Result Value   CO2 18 (*)    Glucose, Bld 242 (*)    All other components within normal limits  CBC - Abnormal; Notable for the following components:   WBC 17.5 (*)    Hemoglobin 12.1 (*)    HCT 38.1 (*)    MCV 65.8 (*)    MCH 20.9 (*)    RDW 18.3 (*)    All other components within normal limits  I-STAT TROPONIN, ED    EKG  EKG Interpretation None       Radiology Dg Chest Port 1 View  Result Date: 03/28/2017 CLINICAL DATA:  Pill stuck in throat. EXAM: PORTABLE CHEST 1 VIEW COMPARISON:  Chest radiograph July 09, 2015 FINDINGS: The heart size and mediastinal contours are within normal limits. Both lungs are clear. Mild hyperinflation. The visualized skeletal structures are unremarkable. No radiopaque foreign bodies. IMPRESSION: Mild hyperinflation without acute cardiopulmonary process. Electronically Signed   By: Elon Alas M.D.   On: 03/28/2017 21:11    Procedures Procedures (including critical care time)  Medications Ordered in ED Medications  albuterol (PROVENTIL) (2.5 MG/3ML) 0.083% nebulizer solution 2.5 mg (2.5 mg Nebulization Given 03/28/17 2059)  0.9 %  sodium chloride infusion ( Intravenous New Bag/Given 03/28/17 2143)  glucagon (human recombinant) (GLUCAGEN) injection 1 mg (1 mg Intravenous Given 03/29/17 0050)  ondansetron (ZOFRAN) injection 4 mg (4 mg Intravenous Given 03/29/17 0050)     Initial Impression / Assessment and Plan / ED Course  I have reviewed the triage vital signs and the nursing notes.  Pertinent labs & imaging results that were available during my care of the patient were reviewed by me and considered in my medical decision making (see chart for details).  Clinical Course as of Mar 29 114  Sat Mar 28, 2017  2100 Attempted to give patient a small sip of sprite.  After about 30  seconds he coughed it back up.  Same on second attempt.  A lot of belching.   [EH]  2139 Patient re-evaluated,  no change.   [EH]  2322 Patient reevaluated, he reports that he is still attempting small sips of Sprite without success.  He feels like it is stuck in the same spot.  [EH]  2346 Spoke with NP Chester Holstein from [facility] who will call Dr. Ardis Hughs and call me back.   [EH]  Sun Mar 29, 2017  0007 Spoke with NP Chester Holstein from [facility] to confirm no call back from Dr. Ardis Hughs.  She will attempt to contact him.   [EH]  0014 Spoke with answering service for [facility], confirmed have not gotten a call back yet.  She reports will attempt to try again.   [EH]  0030 Spoke with Dr. Ardis Hughs from [facility] who reports he will be in to see patient shortly.   [EH]    Clinical Course User Index [EH] Lorin Glass, PA-C   Ronny Bacon presents today for evaluation of feeling like he has a pill stuck in his throat.  The sensation began immediately after he attempted to swallow his metformin.  He reports that it was not a normal swallow and that he feels like he almost choked on it.  He reports that he has not been able to keep any liquids down since.  On initial exam he had diffuse wheezes, consistent with his reported recent asthma exacerbations.  This was treated with nebulizer treatment which greatly improved his lung sounds.  Chest x-ray was obtained, no obvious areas without ventilation suggestive of an airways.  Patient is able to speak without difficulties.  Basic labs were obtained.  Patient was given soda to sip on and continued to vomit it up.   Patient was able to maintain his own airway with sitting up right and being allowed to spit out his secretions.  CT imaging was considered, however I feel like this would not be prudent given that he would need to lay down and I suspect he would have great difficulty maintaining an airway.  He was able to speak with out dyspnea, did not have stridor on  exam.  He had a mild asthma exacerbation on exam which was treated with a duo neb with resolved the episode.    GI was consulted as patient symptoms were not improving despite multiple hours of observation.  Initially spoke with GI nurse practitioner who recommended glucagon and Zofran.  Spoke with Dr. Jama Flavors [facility] who requested that I make patient n.p.o., and that he will call in his team to come scope the patient.  Patient was informed of this, and states his understanding.    Spoke with Dr. Ardis Hughs who has arrived in ED.  He will scope patient.  Dr. Dina Rich is aware of patient and will perform any additional emergency medical care as needed.     Final Clinical Impressions(s) / ED Diagnoses   Final diagnoses:  None    ED Discharge Orders    None       Ollen Gross 03/29/17 0116    Fredia Sorrow, MD 03/29/17 [phone]

## 2017-03-29 ENCOUNTER — Encounter (HOSPITAL_COMMUNITY): Payer: Self-pay | Admitting: Physician Assistant

## 2017-03-29 ENCOUNTER — Emergency Department (HOSPITAL_COMMUNITY): Payer: Self-pay

## 2017-03-29 ENCOUNTER — Encounter (HOSPITAL_COMMUNITY)
Admission: EM | Disposition: A | Discharge status: Home / Self Care | Payer: Self-pay | Source: Home / Self Care | Attending: Emergency Medicine

## 2017-03-29 ENCOUNTER — Encounter (HOSPITAL_COMMUNITY): Payer: Self-pay | Admitting: Gastroenterology

## 2017-03-29 DIAGNOSIS — K209 Esophagitis, unspecified: Secondary | ICD-10-CM

## 2017-03-29 DIAGNOSIS — T18108A Unspecified foreign body in esophagus causing other injury, initial encounter: Secondary | ICD-10-CM | POA: Insufficient documentation

## 2017-03-29 DIAGNOSIS — R1310 Dysphagia, unspecified: Secondary | ICD-10-CM

## 2017-03-29 HISTORY — PX: ESOPHAGOGASTRODUODENOSCOPY: SHX5428

## 2017-03-29 SURGERY — EGD (ESOPHAGOGASTRODUODENOSCOPY)
Anesthesia: Moderate Sedation

## 2017-03-29 MED ORDER — ONDANSETRON HCL 4 MG/2ML IJ SOLN
4.0000 mg | Freq: Once | INTRAMUSCULAR | Status: AC
  Administered 2017-03-29: 4 mg via INTRAVENOUS
  Filled 2017-03-29: qty 2

## 2017-03-29 MED ORDER — KETAMINE HCL-SODIUM CHLORIDE 100-0.9 MG/10ML-% IV SOSY
1.5000 mg/kg | PREFILLED_SYRINGE | Freq: Once | INTRAVENOUS | Status: AC
  Administered 2017-03-29: 100 mg via INTRAVENOUS
  Filled 2017-03-29: qty 20

## 2017-03-29 MED ORDER — IPRATROPIUM-ALBUTEROL 0.5-2.5 (3) MG/3ML IN SOLN
3.0000 mL | Freq: Once | RESPIRATORY_TRACT | Status: AC
  Administered 2017-03-29: 3 mL via RESPIRATORY_TRACT
  Filled 2017-03-29: qty 3

## 2017-03-29 MED ORDER — FENTANYL CITRATE (PF) 100 MCG/2ML IJ SOLN
INTRAMUSCULAR | Status: DC | PRN
  Administered 2017-03-29: 25 ug via INTRAVENOUS
  Administered 2017-03-29: 50 ug via INTRAVENOUS
  Administered 2017-03-29: 25 ug via INTRAVENOUS

## 2017-03-29 MED ORDER — GLUCAGON HCL RDNA (DIAGNOSTIC) 1 MG IJ SOLR
1.0000 mg | Freq: Once | INTRAMUSCULAR | Status: AC
  Administered 2017-03-29: 1 mg via INTRAVENOUS
  Filled 2017-03-29: qty 1

## 2017-03-29 MED ORDER — DIPHENHYDRAMINE HCL 50 MG/ML IJ SOLN
INTRAMUSCULAR | Status: AC
  Filled 2017-03-29: qty 1

## 2017-03-29 MED ORDER — BUTAMBEN-TETRACAINE-BENZOCAINE 2-2-14 % EX AERO
INHALATION_SPRAY | CUTANEOUS | Status: DC | PRN
Start: 2017-03-29 — End: 2017-03-29
  Administered 2017-03-29: 2 via TOPICAL

## 2017-03-29 MED ORDER — SUCCINYLCHOLINE CHLORIDE 20 MG/ML IJ SOLN
20.0000 mg | Freq: Once | INTRAMUSCULAR | Status: AC
  Administered 2017-03-29: 20 mg via INTRAVENOUS
  Filled 2017-03-29: qty 1

## 2017-03-29 MED ORDER — FENTANYL CITRATE (PF) 100 MCG/2ML IJ SOLN
INTRAMUSCULAR | Status: AC
  Filled 2017-03-29: qty 4

## 2017-03-29 MED ORDER — OMEPRAZOLE 20 MG PO CPDR: 20.0000 mg | DELAYED_RELEASE_CAPSULE | Freq: Every day | ORAL | 0 refills | Status: DC

## 2017-03-29 MED ORDER — MIDAZOLAM HCL 10 MG/2ML IJ SOLN
INTRAMUSCULAR | Status: DC | PRN
  Administered 2017-03-29 (×2): 2 mg via INTRAVENOUS

## 2017-03-29 MED ORDER — MIDAZOLAM HCL 5 MG/ML IJ SOLN
INTRAMUSCULAR | Status: AC
  Filled 2017-03-29: qty 3

## 2017-03-29 MED ORDER — FLUCONAZOLE 100 MG PO TABS: 100.0000 mg | ORAL_TABLET | Freq: Every day | ORAL | 0 refills | Status: AC

## 2017-03-29 MED ORDER — MIDAZOLAM HCL 2 MG/2ML IJ SOLN: 2.0000 mg | Freq: Once | INTRAMUSCULAR | Status: DC

## 2017-03-29 NOTE — ED Notes (Signed)
Fentanyl  50 mcg iv  Per endo rn

## 2017-03-29 NOTE — ED Notes (Signed)
Intubated by dr Dina Rich

## 2017-03-29 NOTE — ED Notes (Signed)
Family at bedside. 

## 2017-03-29 NOTE — ED Notes (Signed)
Versed 2mg  iv  Barb j rn

## 2017-03-29 NOTE — ED Notes (Signed)
Ketamine 100mg  iv barb j rn

## 2017-03-29 NOTE — ED Notes (Signed)
Patient given cup of ice water.  Able to drink without difficulty.  No coughing noted.  Spit out blood tinged sputum and explained that he may notice that since having the tube in his throat

## 2017-03-29 NOTE — Op Note (Signed)
[facility] Patient Name: [name] Procedure Date : 03/29/2017 MRN: [id number] Attending MD: Milus Banister , MD Date of Birth: [birth year] CSN: [id number] Age: 56 Admit Type: Outpatient Procedure:                Upper GI endoscopy Indications:              Esophageal foreign body (metformin pill) Providers:                Milus Banister, MD, Kingsley Plan, RN,                            William Dalton, Technician Referring MD:              Medicines:                Fentanyl 100 micrograms IV, Midazolam 8 mg IV,                            Ketamine [id number] Complications:            No immediate complications. Estimated blood loss:                            None. Estimated Blood Loss:     Estimated blood loss: none. Procedure:                Pre-Anesthesia Assessment:                           - Prior to the procedure, a History and Physical                            was performed, and patient medications and                            allergies were reviewed. The patient's tolerance of                            previous anesthesia was also reviewed. The risks                            and benefits of the procedure and the sedation                            options and risks were discussed with the patient.                            All questions were answered, and informed consent                            was obtained. Prior Anticoagulants: The patient has                            taken Plavix (clopidogrel), last dose was 1 day  prior to procedure. ASA Grade Assessment: III - A                            patient with severe systemic disease. After                            reviewing the risks and benefits, the patient was                            deemed in satisfactory condition to undergo the                            procedure.                           After obtaining informed consent, the endoscope was                             passed under direct vision. Throughout the                            procedure, the patient's blood pressure, pulse, and                            oxygen saturations were monitored continuously. The                            EG-[id number] ([id number]) scope was introduced through the                            mouth, and advanced to the antrum of the stomach.                            The upper GI endoscopy was somewhat difficult due                            to the patient's inability to tolerate conscious                            sedation (he was coughing profusely very soon after                            esophageal intubation, has baseline asthma, I                            discussed with ER MD and we agreed to proceed with                            elective ET intubation to safely complete the                            procedure). The patient tolerated the procedure  well. Scope In: Scope Out: Findings:      Yellow, whitish exudate lining the walls of much of the esophagus,       consistent with candida infection.      There was a partially dissolved white pill in the middle of the       esophagus. Initially I was unable to advance the pill into his stomach.       I used a snare to grab the pill and fractured it several times. I then       was able to gently push the pill fragments into his stomach.      I did not detect a specific stricture in the esophagus.      The stomach was normal. Impression:               - Partially dissolved pill in the mid esophagus;                            treated with snare (shaved the pill down small                            enough to allow it to be pushed into his stomach).                           - Esophageal yeast infection, this is likely the                            cause of his pill impaction. No underlying                            stricture was noted. Moderate Sedation:      Initially  moderate (conscious) sedation was administered by the       endoscopy nurse and supervised by the endoscopist. The following       parameters were monitored: oxygen saturation, heart rate, blood       pressure, and response to care. Total physician intraservice time was 15       minutes. Eventual ET intubation by ER staff electively in order to       safely complete the procedure. Recommendation:           - Patient has a contact number available for                            emergencies. The signs and symptoms of potential                            delayed complications were discussed with the                            patient. Return to normal activities tomorrow.                            Written discharge instructions were provided to the                            patient.                           -  Soft diet for 2-3 days, then advance to regular.                           - Diflucan 100mg  pill, one pill once daily for 10                            days.                           - Omeprazole 20mg  (OTC) one pill once daily.                           - My office will contact him about follow up office                            appt in 6-7 weeks. Procedure Code(s):        --- Professional ---                           [phone], 37, Esophagogastroduodenoscopy, flexible,                            transoral; with removal of foreign body(s) Diagnosis Code(s):        --- Professional ---                           [phone], Other foreign object in esophagus causing                            other injury, initial encounter                           R13.10, Dysphagia, unspecified CPT copyright 2016 American Medical Association. All rights reserved. The codes documented in this report are preliminary and upon coder review may  be revised to meet current compliance requirements. Milus Banister, MD 03/29/2017 2:52:49 AM This report has been signed electronically. Number of Addenda: 0

## 2017-03-29 NOTE — Progress Notes (Signed)
4mg  versed IV given and documented by ED RN from medications pulled by Endo RN from endo pyxis. Patient received a total of 8mg  versed throughout EGD process. 2mg  Versed wasted with Nicholas Lose, RN.

## 2017-03-29 NOTE — ED Notes (Signed)
Versed 2mg  iv brab jrn

## 2017-03-29 NOTE — ED Notes (Signed)
Patient moaning out loud when sleeping.  Opens eyes when spoken to and follows directions

## 2017-03-29 NOTE — ED Notes (Signed)
Succ\[id number]\ 20 mg iv barb j rn

## 2017-03-29 NOTE — Procedures (Signed)
Extubation Procedure Note  Patient Details:   Name: [name] DOB: [birth year] MRN: [id number]   Airway Documentation:  Airway 7.5 mm (Active)  Secured at (cm) 25 cm 03/29/2017  2:21 AM  Measured From Lips 03/29/2017  2:21 AM  Secured Location Right 03/29/2017  2:21 AM  Secured By [employer] 03/29/2017  2:21 AM  Tube Holder Repositioned Yes 03/29/2017  2:21 AM  Site Condition Cool;Dry 03/29/2017  2:21 AM    Evaluation  O2 sats: stable throughout Complications: No apparent complications Patient did tolerate procedure well. Bilateral Breath Sounds: Other (Comment), Diminished(tight aeration)   Yes  Leigh Aurora, BS, RRT, RCP 03/29/2017, 3:03 AM

## 2017-03-29 NOTE — ED Provider Notes (Signed)
Patient signed out pending endoscopy.  In brief this is a 56 year old male who presents with concerns for retained pill.  He does report history of issues with swallowing.  He took his metformin this evening and since that time has been unable to tolerate any fluids by mouth.  He was evaluated by Dr. Ardis Hughs.  Dr. Ardis Hughs attempted bedside endoscopy.  He was able to evaluate enough to establish retained pill as well as extensive soft general infection.  Unfortunately, patient has a history of asthma and had recurrent episodes of coughing during the procedure.  Dr. Ardis Hughs did not feel it was safe to proceed without protecting his airway.  On my evaluation, patient is somnolent but arousable.  He is status post sedation medications.  He does not have capacity to consent for elective intubation.  2 physician consent was signed.  His family was also notified and they are agreeable to plan.  Patient was electively intubated without difficulty.  He did have some post intubation hypertension and tachycardia.  He was further sedated.  Plan will be to perform endoscopy and extubate when awake.  Extubated without difficulty.  He is fairly sleepy but airway is intact.  4:15 AM Family is at the bedside.  Patient is awake, alert, and oriented.  He is tolerating fluids.  Will discharge home with omeprazole and Diflucan per GI.  Follow-up with GI recommendation.   Physical Exam  BP (!) 217/129   Pulse (!) 135   Temp 98.1 F (36.7 C) (Oral)   Resp 19   Ht 5\' 8"  (1.727 m)   Wt 72.6 kg (160 lb)   SpO2 100%   BMI 24.33 kg/m   Physical Exam Somnolent but arousable No respiratory distress, scant wheezing in all lung fields, occasional cough ED Course/Procedures   Clinical Course as of Mar 29 220  Sat Mar 28, 2017  2100 Attempted to give patient a small sip of sprite.  After about 30 seconds he coughed it back up.  Same on second attempt.  A lot of belching.   [EH]  2139 Patient re-evaluated, no change.   [EH]   2322 Patient reevaluated, he reports that he is still attempting small sips of Sprite without success.  He feels like it is stuck in the same spot.  [EH]  2346 Spoke with NP Chester Holstein from [facility] who will call Dr. Ardis Hughs and call me back.   [EH]  Sun Mar 29, 2017  0007 Spoke with NP Chester Holstein from [facility] to confirm no call back from Dr. Ardis Hughs.  She will attempt to contact him.   [EH]  0014 Spoke with answering service for [facility], confirmed have not gotten a call back yet.  She reports will attempt to try again.   [EH]  0030 Spoke with Dr. Ardis Hughs from [facility] who reports he will be in to see patient shortly.   [EH]    Clinical Course User Index [EH] Ollen Gross    Procedure Name: Intubation Date/Time: 03/29/2017 2:22 AM Performed by: Merryl Hacker, MD Pre-anesthesia Checklist: Patient identified, Patient being monitored, Emergency Drugs available, Timeout performed and Suction available Oxygen Delivery Method: Non-rebreather mask Preoxygenation: Pre-oxygenation with 100% oxygen Induction Type: Rapid sequence Ventilation: Mask ventilation without difficulty Laryngoscope Size: Glidescope and 4 Grade View: Grade III Tube size: 7.5 mm Number of attempts: 1 Placement Confirmation: ETT inserted through vocal cords under direct vision,  CO2 detector and Breath sounds checked- equal and bilateral Tube secured with: ETT  holder Dental Injury: Teeth and Oropharynx as per pre-operative assessment  Difficulty Due To: Difficulty was anticipated Comments: Patient intubated for endoscopy and airway protection purposes.            Merryl Hacker, MD 03/29/17 [phone]

## 2017-03-29 NOTE — ED Notes (Signed)
Endoscopy perfromed

## 2017-03-29 NOTE — Progress Notes (Signed)
Report given to ED RN for transfer to trauma room for intubation to continue EGD. Moderate sedation monitoring by this RN stopped at this time.

## 2017-03-29 NOTE — Discharge Instructions (Signed)
He was seen today for a foreign body in your esophagus.  You have evidence of infection.  Take medications as prescribed.  Follow-up with GI.  If you develop any new or worsening symptoms, you should be reevaluated.

## 2017-03-29 NOTE — ED Notes (Signed)
Et tube removed per dr Dina Rich

## 2017-03-29 NOTE — Progress Notes (Signed)
Pt intubated by MD for procedure. BBS =, +EtCO2. Pt placed on vent RT will continue to monitor

## 2017-03-29 NOTE — ED Notes (Signed)
Pt states he is still unable to swallow any fluids. Frequent cough noted, pt states he has not received the antibiotics he was prescribed yesterday. Pt medicated with Zofran and Glucagon

## 2017-03-29 NOTE — ED Notes (Signed)
Prescriptions and discharge instructions reviewed with patient and family -  Voiced understanding

## 2017-03-29 NOTE — ED Notes (Signed)
Pt being prepared to untubate

## 2017-03-29 NOTE — Progress Notes (Signed)
Endoscopy staff in room for continuation of EGD after intubation. Procedure monitoring reassumed by Probation officer.

## 2017-03-29 NOTE — ED Notes (Signed)
t asked coughing copious amounts of sputum

## 2017-03-29 NOTE — H&P (Signed)
HPI: This is a very pleasant 56 yo man  Chief complaint is pill impaction  HAs had 1-2 years of intermittent solid food, non-progressive dysphagia.  Mild GERD symptoms as well.  Last night metformin pill lodged in esophagus while taking it and since then he's been unable to manage his secretion or handle even small sips of water.  Glucagon did not help in ER  Takes plavix for 10 years for CAD, stent in heart.  No weight loss.  Otherwise feels fine.  ROS: complete GI ROS as described in HPI, all other review negative.  Constitutional:  No unintentional weight loss   Past Medical History:  Diagnosis Date  . Asthma   . Asthma   . CAD (coronary artery disease)   . Cellulitis of leg, right 10/2014  . Dental caries   . Diabetes mellitus    TYPE 2  . GERD (gastroesophageal reflux disease)   . Hyperlipidemia   . Myocardial infarction ([place]) 2008  . Thyroid nodule    biopsy negative 2006    Past Surgical History:  Procedure Laterality Date  . CORONARY STENT PLACEMENT    . solitary pulm and thryoid nodules      No current facility-administered medications for this encounter.    Current Outpatient Medications  Medication Sig Dispense Refill  . albuterol (PROVENTIL HFA;VENTOLIN HFA) 108 (90 Base) MCG/ACT inhaler Inhale 2 puffs into the lungs every 4 (four) hours as needed for wheezing or shortness of breath (((PLAN B))). 3 Inhaler 0  . albuterol (PROVENTIL) (2.5 MG/3ML) 0.083% nebulizer solution Take 3 mLs (2.5 mg total) by nebulization every 4 (four) hours as needed for wheezing or shortness of breath. 75 mL 0  . aspirin (ADULT ASPIRIN EC LOW STRENGTH) 81 MG EC tablet Take 81 mg by mouth every morning.     Marland Kitchen atorvastatin (LIPITOR) 40 MG tablet Take 1 tablet (40 mg total) by mouth at bedtime. Please call our office to schedule an overdue yearly appointment before anymore refills. [phone]. Thank you 1st attempt 30 tablet 0  . bisoprolol (ZEBETA) 5 MG tablet Take 2.5 mg by  mouth daily. Take 1/2 tablet by mouth every morning     . calcium-vitamin D (OSCAL WITH D) 250-125 MG-UNIT tablet Take 1 tablet by mouth 2 (two) times daily.    . clopidogrel (PLAVIX) 75 MG tablet Take 1 tablet (75 mg total) by mouth every morning. 30 tablet 7  . dextromethorphan (DELSYM) 30 MG/5ML liquid Take 2 tsp every 12 hours as needed for cough    . HYDROcodone-homatropine (HYCODAN) 5-1.5 MG/5ML syrup Take 5 mLs by mouth every 8 (eight) hours as needed for cough. 120 mL 0  . lisinopril (PRINIVIL,ZESTRIL) 5 MG tablet Take 5 mg by mouth daily.    . metFORMIN (GLUCOPHAGE) 1000 MG tablet Take 1 tablet (1,000 mg total) by mouth 2 (two) times daily with a meal. 60 tablet 11  . mometasone (NASONEX) 50 MCG/ACT nasal spray Place 2 sprays into the nose daily as needed (Allergies). 17 g 3  . montelukast (SINGULAIR) 10 MG tablet Take 1 tablet (10 mg total) by mouth at bedtime. 30 tablet 5  . Multiple Vitamin (MULTIVITAMIN) tablet Take 1 tablet by mouth every morning.     . pantoprazole (PROTONIX) 40 MG tablet Take 1 tablet (40 mg total) by mouth daily. Take 30-60 min before first meal of the day 30 tablet 0  . predniSONE (DELTASONE) 5 MG tablet Take 1-2 tablets (5-10 mg total) by mouth daily with breakfast. (  Patient taking differently: Take 10 mg by mouth daily with breakfast. ) 100 tablet 2  . Tiotropium Bromide Monohydrate (SPIRIVA RESPIMAT) 2.5 MCG/ACT AERS Inhale 2 puffs into the lungs every morning. 1 Inhaler 11  . levofloxacin (LEVAQUIN) 500 MG tablet Take 1 tablet (500 mg total) by mouth daily. (Patient not taking: Reported on 03/28/2017) 7 tablet 0  . losartan (COZAAR) 50 MG tablet Take 1 tablet (50 mg total) by mouth daily. (Patient not taking: Reported on 03/28/2017) 90 tablet 3    Allergies as of 03/28/2017  . (No Known Allergies)    Family History  Problem Relation Age of Onset  . Diabetes Mother   . Heart attack Brother 66    Social History   Socioeconomic History  . Marital  status: Married    Spouse name: Not on file  . Number of children: Not on file  . Years of education: Not on file  . Highest education level: Not on file  Social Needs  . Financial resource strain: Not on file  . Food insecurity - worry: Not on file  . Food insecurity - inability: Not on file  . Transportation needs - medical: Not on file  . Transportation needs - non-medical: Not on file  Occupational History  . Not on file  Tobacco Use  . Smoking status: Former Smoker    Packs/day: 0.50    Years: 22.00    Pack years: 11.00    Types: Cigarettes    Last attempt to quit: 04/21/2006    Years since quitting: 10.9  . Smokeless tobacco: Never Used  . Tobacco comment: quit 10 years ago  Substance and Sexual Activity  . Alcohol use: No    Alcohol/week: 0.0 oz  . Drug use: No  . Sexual activity: Yes    Birth control/protection: Condom  Other Topics Concern  . Not on file  Social History Narrative   Lives with wife; recently moved to [place] from [place] due to health problems. Has no means of affording meds currently + tobacco 1/2ppd x 66yrs, no ETOH no drugs.     Physical Exam: BP (!) 145/84   Pulse 86   Temp 98.1 F (36.7 C) (Oral)   Resp 20   Ht 5\' 8"  (1.727 m)   Wt 160 lb (72.6 kg)   SpO2 96%   BMI 24.33 kg/m  Constitutional: generally well-appearing Psychiatric: alert and oriented x3 Abdomen: soft, nontender, nondistended, no obvious ascites, no peritoneal signs, normal bowel sounds No peripheral edema noted in lower extremities  Assessment and plan: 56 y.o. male with foreign body in esophagus, dsyphagia  Unusual for a pill to become lodge in the esophagus but I think that is what has happened. He is unable to manage his secretions.    Planning EGD in ER now.  Please see the "Patient Instructions" section for addition details about the plan.  Owens Loffler, MD [facility] 03/29/2017, 1:14 AM

## 2017-03-30 ENCOUNTER — Encounter (HOSPITAL_COMMUNITY): Payer: Self-pay | Admitting: Gastroenterology

## 2017-04-01 ENCOUNTER — Telehealth: Payer: Self-pay | Admitting: Internal Medicine

## 2017-04-01 ENCOUNTER — Telehealth: Payer: Self-pay

## 2017-04-01 NOTE — Telephone Encounter (Signed)
-----   Message from Milus Banister, MD sent at 03/29/2017  3:05 AM EST ----- He needs rov with me in 6-7 weeks  [email or web address]

## 2017-04-01 NOTE — Telephone Encounter (Signed)
Medication has been faxed. Nothing further was needed.

## 2017-04-01 NOTE — Telephone Encounter (Signed)
The pt has been scheduled for office visit 2/5 1:45 pm.  He has been advised

## 2017-04-07 ENCOUNTER — Telehealth: Payer: Self-pay | Admitting: Internal Medicine

## 2017-04-07 NOTE — Telephone Encounter (Signed)
ATC pt, no answer. Left message for pt to call back.  

## 2017-04-07 NOTE — Telephone Encounter (Signed)
I met with pt in the lobby and gave pt samples of Spiriva. Nothing further is needed.

## 2017-04-07 NOTE — Telephone Encounter (Signed)
Patient waiting in lobby; requesting a sample of Spiriva

## 2017-04-09 ENCOUNTER — Telehealth: Payer: Self-pay | Admitting: Internal Medicine

## 2017-04-09 NOTE — Telephone Encounter (Signed)
He told me he had stopped it already and made no difference but he is easily confused with details of care and now on ACEi so needs to regroup with all meds in hand and med calendar to see me or Tammy NP in the next week - ok to add on for me if nothing available Can give two week sample of symb 160 to get to office

## 2017-04-09 NOTE — Telephone Encounter (Signed)
ATC pt, no answer. Left message for pt to call back.  

## 2017-04-09 NOTE — Telephone Encounter (Signed)
Called and spoke with [name]. She is aware of MW's response.  Spoke with [name]. He is aware that MW would like to see him to figure out his medications. OV has been scheduled for 04/15/17. Nothing further was needed.

## 2017-04-09 NOTE — Telephone Encounter (Signed)
lmtcb for [name].

## 2017-04-09 NOTE — Telephone Encounter (Signed)
[name] from [facility]. Returning call. CB is [phone].

## 2017-04-09 NOTE — Telephone Encounter (Signed)
Spoke with [name] with [facility]. She is needing prescriptions for Spiriva Respimat, Proventil, Dulera and Nasonex. Ruthe Mannan is not listed on the pt's med list. In his last OV note, MW did not make any mention of him stopping [place] or continuing Dulera.  MW - please advise if pt should still be on [facility]. Thanks.

## 2017-04-09 NOTE — Telephone Encounter (Signed)
[name] returned call, CB is [phone].

## 2017-04-15 ENCOUNTER — Ambulatory Visit (INDEPENDENT_AMBULATORY_CARE_PROVIDER_SITE_OTHER): Payer: No Typology Code available for payment source | Admitting: Internal Medicine

## 2017-04-15 ENCOUNTER — Encounter: Payer: Self-pay | Admitting: Internal Medicine

## 2017-04-15 DIAGNOSIS — I10 Essential (primary) hypertension: Secondary | ICD-10-CM

## 2017-04-15 DIAGNOSIS — J449 Chronic obstructive pulmonary disease, unspecified: Secondary | ICD-10-CM

## 2017-04-15 MED ORDER — OMEPRAZOLE 20 MG PO CPDR: 20.0000 mg | DELAYED_RELEASE_CAPSULE | Freq: Two times a day (BID) | ORAL | 2 refills | Status: DC

## 2017-04-15 MED ORDER — PANTOPRAZOLE SODIUM 40 MG PO TBEC: DELAYED_RELEASE_TABLET | ORAL | 2 refills | Status: DC

## 2017-04-15 NOTE — Patient Instructions (Addendum)
Stop lisinopril and start losartan 50 mg daily in its place   Prednisone 10 mg daily until better then 5 mg daily thereafter  Stop omeprazole and start Pantoprazole Take 30- 60 min before your first and last meals of the day until swallowing bette for a week then Take 30-60 min before first meal of the day   You need to keep up with your medications    See Tammy NP  3 weeks with all your medications, even over the counter meds, separated in two separate bags, the ones you take no matter what vs the ones you stop once you feel better and take only as needed when you feel you need them.   Tammy  will generate for you a new user friendly medication calendar that will put [place] all on the same page re: your medication use.     Without this process, it simply isn't possible to assure that we are providing  your outpatient care  with  the attention to detail we feel you deserve.   If we cannot assure that you're getting that kind of care,  then we cannot manage your problem effectively from this clinic.  Once you have seen Tammy and we are sure that we're all on the same page with your medication use she will arrange follow up with me but in the future if you come to see me you will need to bring both your medications and your medication calendar to assure you get the care you deserve
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
## 2017-04-17 ENCOUNTER — Other Ambulatory Visit: Payer: Self-pay | Admitting: Family Medicine

## 2017-04-17 MED ORDER — METFORMIN HCL 1000 MG PO TABS: 1000.0000 mg | ORAL_TABLET | Freq: Two times a day (BID) | ORAL | 3 refills | Status: DC

## 2017-04-22 ENCOUNTER — Encounter: Payer: Self-pay | Admitting: Internal Medicine

## 2017-04-22 DIAGNOSIS — I10 Essential (primary) hypertension: Secondary | ICD-10-CM | POA: Insufficient documentation

## 2017-04-22 NOTE — Assessment & Plan Note (Addendum)
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 2008  - PFT's 2007  FEV1  1.78 (50%) ratio 52 and 19% resp to saba, dlco 75% - d/c coreg 09/03/2013  - spirometry 12/18/2014 >  FEV1 1.04 (34%) ratio 53     > trial of dulera 200 2bid and prednisone 5 mg daily   - Spiriva respimat added 05/01/2015  - max gerd rx1/01/2016 > improved 06/12/2015   - FENO 12/21/2015  =   9  p am symb 160 rx - Spirometry 12/21/2015  FEV1 1.33 (47%)  Ratio 53 p am symb 160/spiriva  - 04/15/2017  After extensive coaching HFA effectiveness =    90% from a baseline of 75%    DDX of  difficult airways management almost all start with A and  include Adherence, Ace Inhibitors, Acid Reflux, Active Sinus Disease, Alpha 1 Antitripsin deficiency, Anxiety masquerading as Airways dz,  ABPA,  Allergy(esp in young), Aspiration (esp in elderly), Adverse effects of meds,  Active smokers, A bunch of PE's (a small clot burden can't cause this syndrome unless there is already severe underlying pulm or vascular dz with poor reserve) plus two Bs  = Bronchiectasis and Beta blocker use..and one C= CHF   Adherence is always the initial "prime suspect" and is a multilayered concern that requires a "trust but verify" approach in every patient - starting with knowing how to use medications, especially inhalers, correctly, keeping up with refills and understanding the fundamental difference between maintenance and prns vs those medications only taken for a very short course and then stopped and not refilled.  - very confused again with meds  - advised matter of life or death that both we and he understand exactly what he is taking before we attempt to treat him - return with all meds in hand using a trust but verify approach to confirm accurate Medication  Reconciliation The principal here is that until we are certain that the  patients are doing what we've asked, it makes no sense to ask them to do more.    ACEi effects > this type of pt should not be  on acei  (see separate a/p)   ? Acid (or non-acid) GERD > always difficult to exclude as up to 75% of pts in some series report no assoc GI/ Heartburn symptoms> rec continue max (24h)  acid suppression and diet restrictions/ reviewed      ? Allergy > The goal with a chronic steroid dependent illness is always arriving at the lowest effective dose that controls the disease/symptoms and not accepting a set "formula" which is based on statistics or guidelines that don't always take into account patient  variability or the natural hx of the dz in every individual patient, which may well vary over time.  For now therefore I recommend the patient maintain  10 mg ceiling and 5 mg floor   ? Anxiety > usually at the bottom of this list of usual suspects but should be much higher on this pt's based on H and P and may interfere with adherence "cozaar is too heavy"  ? BB effects > unlikely on low dose Zebeta    I had an extended discussion with the patient reviewing all relevant studies completed to date and  lasting 15 to 20 minutes of a 25 minute visit    Each maintenance medication was reviewed in detail including most importantly the difference between maintenance and prns and under what circumstances the prns are to be  triggered using an action plan format that is not reflected in the computer generated alphabetically organized AVS.    Please see AVS for specific instructions unique to this visit that I personally wrote and verbalized to the the pt in detail and then reviewed with pt  by my nurse highlighting any  changes in therapy recommended at today's visit to their plan of care.

## 2017-04-22 NOTE — Assessment & Plan Note (Signed)
Changed from acei to ARB due to  Dysphagia/ choking sensation   In the best review of chronic cough to date ( NEJM 2016 375 1544-1551) ,  ACEi are now felt to cause cough in up to  20% of pts which is a 4 fold increase from previous reports and does not include the variety of non-specific complaints we see in pulmonary clinic in pts on ACEi but previously attributed to another dx like  Copd/asthma and  include PNDS, throat and chest congestion, "bronchitis", unexplained dyspnea and noct "strangling" sensations, and hoarseness, but also  atypical /refractory GERD symptoms like dysphagia and "bad heartburn"   The only way I know  to prove this is not an "ACEi Case" is a trial off ACEi x a minimum of 6 weeks then regroup.

## 2017-04-27 ENCOUNTER — Other Ambulatory Visit: Payer: Self-pay | Admitting: Internal Medicine

## 2017-04-27 ENCOUNTER — Telehealth: Payer: Self-pay | Admitting: Internal Medicine

## 2017-04-27 MED ORDER — FLUTICASONE PROPIONATE 50 MCG/ACT NA SUSP: 2.0000 | Freq: Every day | NASAL | 2 refills | Status: DC

## 2017-04-27 MED ORDER — ALBUTEROL SULFATE HFA 108 (90 BASE) MCG/ACT IN AERS: 2.0000 | INHALATION_SPRAY | RESPIRATORY_TRACT | 0 refills | Status: DC | PRN

## 2017-04-27 NOTE — Telephone Encounter (Signed)
Pt requesting asap refills on rescue inhaler and flonase to [facility].  These refills have been called in to pharmacy.  Nothing further needed.

## 2017-04-29 ENCOUNTER — Telehealth: Payer: Self-pay | Admitting: Internal Medicine

## 2017-04-29 NOTE — Telephone Encounter (Signed)
Spoke with [name]. She wanted to know if the patient needed to be on Flonase or Nasonex. She also had questions about Spriva. Advised her that these medications were not discussed during his last OV, but he is scheduled to see TP on 1/23 for a med check. Explained to her that the patient will see TP and they will develop a medication plan for him.   [name] verbalized understanding. Nothing else needed at time of call.

## 2017-05-13 ENCOUNTER — Ambulatory Visit (INDEPENDENT_AMBULATORY_CARE_PROVIDER_SITE_OTHER): Payer: Self-pay | Admitting: Adult Health

## 2017-05-13 ENCOUNTER — Encounter: Payer: Self-pay | Admitting: Adult Health

## 2017-05-13 VITALS — BP 128/80 | HR 111 | Ht 68.0 in | Wt 159.6 lb

## 2017-05-13 DIAGNOSIS — I10 Essential (primary) hypertension: Secondary | ICD-10-CM

## 2017-05-13 DIAGNOSIS — J329 Chronic sinusitis, unspecified: Secondary | ICD-10-CM

## 2017-05-13 DIAGNOSIS — J45909 Unspecified asthma, uncomplicated: Secondary | ICD-10-CM

## 2017-05-13 MED ORDER — MOMETASONE FUROATE 50 MCG/ACT NA SUSP: 2.0000 | Freq: Every day | NASAL | 5 refills | Status: DC | PRN

## 2017-05-13 MED ORDER — TIOTROPIUM BROMIDE MONOHYDRATE 2.5 MCG/ACT IN AERS: 2.0000 | INHALATION_SPRAY | RESPIRATORY_TRACT | 5 refills | Status: DC

## 2017-05-13 MED ORDER — LEVALBUTEROL HCL 0.63 MG/3ML IN NEBU
0.6300 mg | INHALATION_SOLUTION | Freq: Once | RESPIRATORY_TRACT | Status: AC
  Administered 2017-05-13: 0.63 mg via RESPIRATORY_TRACT

## 2017-05-13 MED ORDER — ALBUTEROL SULFATE (2.5 MG/3ML) 0.083% IN NEBU: 2.5000 mg | INHALATION_SOLUTION | Freq: Four times a day (QID) | RESPIRATORY_TRACT | 5 refills | Status: DC | PRN

## 2017-05-13 MED ORDER — MOMETASONE FURO-FORMOTEROL FUM 200-5 MCG/ACT IN AERO: 2.0000 | INHALATION_SPRAY | Freq: Two times a day (BID) | RESPIRATORY_TRACT | 0 refills | Status: DC

## 2017-05-13 MED ORDER — MOMETASONE FURO-FORMOTEROL FUM 200-5 MCG/ACT IN AERO: 2.0000 | INHALATION_SPRAY | Freq: Two times a day (BID) | RESPIRATORY_TRACT | 5 refills | Status: DC

## 2017-05-13 MED ORDER — TIOTROPIUM BROMIDE MONOHYDRATE 2.5 MCG/ACT IN AERS: 2.0000 | INHALATION_SPRAY | Freq: Every day | RESPIRATORY_TRACT | 0 refills | Status: DC

## 2017-05-13 NOTE — Progress Notes (Signed)
'@Patient'  ID: [name], male    DOB: [birth year], 59 y.o.   MRN: [id number]  Chief Complaint  Patient presents with  . Follow-up    Asthma     Referring provider: [place] Bing, DO  HPI: 57 year old male former smoker, quit 2008, followed for severe atopic asthma, chronic sinusitis, elevated IgE (previous Xolair fail) on chronic steroids since 2005.  CAD s/p MI w/ Stent to LAD 2008  TEST  PFT's 2007  FEV1  1.78 (50%) ratio 52 and 19% resp to saba, dlco 75% - spirometry 12/18/2014 >  FEV1 1.04 (34%) ratio 53     > trial of dulera 200 2bid and prednisone 5 mg daily   - FENO 12/21/2015  =   9  p am symb 160 rx - Spirometry 12/21/2015  FEV1 1.33 (47%)  Ratio 53 p am symb 160/spiriva    05/13/2017 Follow up : Asthma  Patient presents for a one-month follow-up. Last visit patient was changed from lisinopril to losartan   Due to increased asthma symptoms with cough and wheezing.  Was recommend to increase chronic steroids to Prednisone 10 mg daily until better than 5 mg.  Was changed from omeprazole to Protonix to better control GERD. Pt says cough is some better but still has wheezing and tightness .  Patient has been off of Dulera or Symbicort for the last 4 months.  Patient does not have any insurance.  Depends on his prescriptions through the health department.  Has not been able to get inhalers through them.  Depends on samples.  He has been on Spiriva but does not feel that it is controlling his symptoms.  We reviewed all his medications and organize them into a medication calendar.  Patient education was given.  We filled out patient assistance program papers for [facility] and Spiriva.  His albuterol nebulizer medicine needs refill.  He also needs a new neb machine he says his is been broken for a few months.  An order to a home care company has been sent. He denies any chest pain orthopnea PND or leg swelling.     No Known Allergies  Immunization History  Administered Date(s)  Administered  . Hepatitis A, Adult 03/21/2015  . Hepatitis B, adult 03/21/2015  . Influenza Split 03/21/2011, 12/21/2011, 01/19/2013, 01/20/2015  . Influenza Whole 01/20/2008, 01/30/2009  . Influenza,inj,Quad PF,6+ Mos 12/21/2015, 05/06/2016  . MMR 03/21/2015  . Meningococcal Mcv4o 05/06/2016  . Pneumococcal Polysaccharide-23 01/30/2009  . Td 05/27/2004  . Tdap 02/27/2015    Past Medical History:  Diagnosis Date  . Asthma   . Asthma   . CAD (coronary artery disease)   . Cellulitis of leg, right 10/2014  . Dental caries   . Diabetes mellitus    TYPE 2  . GERD (gastroesophageal reflux disease)   . Hyperlipidemia   . Myocardial infarction ([place]) 2008  . Thyroid nodule    biopsy negative 2006    Tobacco History: Social History   Tobacco Use  Smoking Status Former Smoker  . Packs/day: 0.50  . Years: 22.00  . Pack years: 11.00  . Types: Cigarettes  . Last attempt to quit: 04/21/2006  . Years since quitting: 11.0  Smokeless Tobacco Never Used  Tobacco Comment   quit 10 years ago   Counseling given: Not Answered Comment: quit 10 years ago   Outpatient Encounter Medications as of 05/13/2017  Medication Sig  . albuterol (PROVENTIL HFA;VENTOLIN HFA) 108 (90 Base) MCG/ACT inhaler Inhale 2 puffs into  the lungs every 4 (four) hours as needed for wheezing or shortness of breath (((PLAN B))).  Marland Kitchen aspirin (ADULT ASPIRIN EC LOW STRENGTH) 81 MG EC tablet Take 81 mg by mouth every morning.   Marland Kitchen atorvastatin (LIPITOR) 40 MG tablet Take 1 tablet (40 mg total) by mouth at bedtime. Please call our office to schedule an overdue yearly appointment before anymore refills. [phone]. Thank you 1st attempt  . calcium-vitamin D (OSCAL WITH D) 250-125 MG-UNIT tablet Take 1 tablet by mouth 2 (two) times daily.  . clopidogrel (PLAVIX) 75 MG tablet Take 1 tablet (75 mg total) by mouth every morning.  . fluticasone (FLONASE) 50 MCG/ACT nasal spray Place 2 sprays into both nostrils daily.  Marland Kitchen losartan  (COZAAR) 50 MG tablet Take 1 tablet (50 mg total) by mouth daily.  . metFORMIN (GLUCOPHAGE) 1000 MG tablet Take 1 tablet (1,000 mg total) by mouth 2 (two) times daily with a meal.  . Multiple Vitamin (MULTIVITAMIN) tablet Take 1 tablet by mouth every morning.   . pantoprazole (PROTONIX) 40 MG tablet Take 30- 60 min before your first and last meals of the day  . predniSONE (DELTASONE) 5 MG tablet Take 1-2 tablets (5-10 mg total) by mouth daily with breakfast. (Patient taking differently: Take 10 mg by mouth daily with breakfast. )  . Tiotropium Bromide Monohydrate (SPIRIVA RESPIMAT) 2.5 MCG/ACT AERS Inhale 2 puffs into the lungs every morning.  . [DISCONTINUED] Tiotropium Bromide Monohydrate (SPIRIVA RESPIMAT) 2.5 MCG/ACT AERS Inhale 2 puffs into the lungs every morning.  . bisoprolol (ZEBETA) 5 MG tablet Take 2.5 mg by mouth daily. Take 1/2 tablet by mouth every morning   . mometasone (NASONEX) 50 MCG/ACT nasal spray Place 2 sprays into the nose daily as needed (Allergies). (Patient not taking: Reported on 05/13/2017)  . mometasone-formoterol (DULERA) 200-5 MCG/ACT AERO Inhale 2 puffs into the lungs 2 (two) times daily.  Marland Kitchen omeprazole (PRILOSEC) 20 MG capsule Take 1 capsule (20 mg total) by mouth 2 (two) times daily before a meal. (Patient not taking: Reported on 05/13/2017)   No facility-administered encounter medications on file as of 05/13/2017.      Review of Systems  Constitutional:   No  weight loss, night sweats,  Fevers, chills, fatigue, or  lassitude.  HEENT:   No headaches,  Difficulty swallowing,  Tooth/dental problems, or  Sore throat,                No sneezing, itching, ear ache,  +nasal congestion, post nasal drip,   CV:  No chest pain,  Orthopnea, PND, swelling in lower extremities, anasarca, dizziness, palpitations, syncope.   GI  No heartburn, indigestion, abdominal pain, nausea, vomiting, diarrhea, change in bowel habits, loss of appetite, bloody stools.   Resp: No chest  wall deformity  Skin: no rash or lesions.  GU: no dysuria, change in color of urine, no urgency or frequency.  No flank pain, no hematuria   MS:  No joint pain or swelling.  No decreased range of motion.  No back pain.    Physical Exam  BP 128/80 (BP Location: Left Arm, Cuff Size: Normal)   Pulse (!) 111   Ht '5\' 8"'  (1.727 m)   Wt 159 lb 9.6 oz (72.4 kg)   SpO2 97%   BMI 24.27 kg/m   GEN: A/Ox3; pleasant , NAD,    HEENT:  [place]/AT,  EACs-clear, TMs-wnl, NOSE-clear, THROAT-clear, no lesions, no postnasal drip or exudate noted.   NECK:  Supple w/ fair ROM;  no JVD; normal carotid impulses w/o bruits; no thyromegaly or nodules palpated; no lymphadenopathy.    RESP  Faint exp wheeze on expiration, speaks in full sentences . no accessory muscle use, no dullness to percussion  CARD:  RRR, no m/r/g, no peripheral edema, pulses intact, no cyanosis or clubbing.  GI:   Soft & nt; nml bowel sounds; no organomegaly or masses detected.   Musco: Warm bil, no deformities or joint swelling noted.   Neuro: alert, no focal deficits noted.    Skin: Warm, no lesions or rashes    Lab Results:  CBC  BMET   Imaging: No results found.   Assessment & Plan:   Asthma Severe persistent Asthma , steroid dependent since 2005. Difficult to control due to lack of insurance/prescription coverage.  Patient assistance completed for [facility] and Spiriva.  Patient's been referred to community health and wellness to help with medical care.  Order for new neb machine sent to DME.  Refills and samples given. Patient's medications were reviewed today and patient education was given. Computerized medication calendar was adjusted/completed  Restart ICS/LABA  Hold on pred 27m daily   Plan  Patient Instructions  Restart Dulera 200 2 puffs Twice daily  , rinse after use Continue on Spiriva 2 puffs daily .  [facility] .  Continue on Prednisone 190mdaily for now .    Follow up with Dr. WeMelvyn NovasIn 4 weeks and As needed   Please contact office for sooner follow up if symptoms do not improve or worsen or seek emergency care      '  Sinusitis, chronic Cont on current regimen       TaRexene EdisonNP 05/13/2017

## 2017-05-13 NOTE — Assessment & Plan Note (Signed)
Severe persistent Asthma , steroid dependent since 2005. Difficult to control due to lack of insurance/prescription coverage.  Patient assistance completed for [employer] and Spiriva.  Patient's been referred to community health and wellness to help with medical care.  Order for new neb machine sent to DME.  Refills and samples given. Patient's medications were reviewed today and patient education was given. Computerized medication calendar was adjusted/completed  Restart ICS/LABA  Hold on pred 10mg  daily   Plan  Patient Instructions  Restart Dulera 200 2 puffs Twice daily  , rinse after use Continue on Spiriva 2 puffs daily .  Apply to Drug assistance  for [facility] and Spiriva .  Continue on Prednisone 10mg  daily for now .  Follow up with Dr. Melvyn Novas  In 4 weeks and As needed   Please contact office for sooner follow up if symptoms do not improve or worsen or seek emergency care      '

## 2017-05-13 NOTE — Patient Instructions (Signed)
Restart Dulera 200 2 puffs Twice daily  , rinse after use Continue on Spiriva 2 puffs daily .  Apply to Drug assistance  for [facility] and Spiriva .  Continue on Prednisone 10mg  daily for now .  Follow up with Dr. Melvyn Novas  In 4 weeks and As needed   Please contact office for sooner follow up if symptoms do not improve or worsen or seek emergency care

## 2017-05-13 NOTE — Addendum Note (Signed)
Addended by: Parke Poisson E on: 05/13/2017 02:57 PM   Modules accepted: Orders

## 2017-05-13 NOTE — Assessment & Plan Note (Signed)
Cont on current regimen  

## 2017-05-13 NOTE — Addendum Note (Signed)
Addended by: Della Goo C on: 05/13/2017 03:01 PM   Modules accepted: Orders

## 2017-05-13 NOTE — Addendum Note (Signed)
Addended by: Parke Poisson E on: 05/13/2017 12:08 PM   Modules accepted: Orders

## 2017-05-13 NOTE — Progress Notes (Signed)
Chart and office note reviewed in detail  > agree with a/p as outlined    

## 2017-05-18 ENCOUNTER — Telehealth: Payer: Self-pay | Admitting: Internal Medicine

## 2017-05-18 ENCOUNTER — Telehealth: Payer: Self-pay | Admitting: Adult Health

## 2017-05-18 MED ORDER — MOMETASONE FURO-FORMOTEROL FUM 200-5 MCG/ACT IN AERO: 2.0000 | INHALATION_SPRAY | Freq: Two times a day (BID) | RESPIRATORY_TRACT | 0 refills | Status: DC

## 2017-05-18 NOTE — Telephone Encounter (Signed)
Received refill request on albuterol inhaler  He just had one inhaler filled on 04/27/17  Per MW needs sooner appt with all meds  ATC, NA LMTCB to get him on the schedule and will then send refill

## 2017-05-18 NOTE — Telephone Encounter (Signed)
[place], [phone].  States prescriptions were not received for proventil, nasonex,dulera, and spiriva.  States sent over refill request for Proventil for an early refill and authorization. Patient had med mgmt visit with TP on 05/13/2016. [phone].

## 2017-05-18 NOTE — Telephone Encounter (Signed)
Will route to [name] for follow up.

## 2017-05-18 NOTE — Telephone Encounter (Signed)
Patient saw TP on 1.23.19 for med calendar Patient assistance forms were given to patient for his Ruthe Mannan and Spiriva along with samples of both Was unaware of the need for patient assistance for Nasonex at time of visit  Called spoke with [name] with the Medication Assistance Program (they operate through the Health Dept to help patients without insurance get their meds) who reported that pt is  Indeed requesting a refill on his Proventil HFA but she was unaware that our office was already in the process of trying to help pt get assistance for his Dulera and Spiriva.  [name] suspects that pt's increased need for Proventil came prior to his appt with TP and receipt of samples.  [name] is unable to dispense another Proventil HFA prior to 30 days of his last refill.  Advised [name] will call patient to discuss.  Called spoke with patient.  He reported that he is now out of his Proventil HFA but since he restarted his Dulera from the samples given at [facility], he is feeling "much better" without need for the Proventil.  Patient is aware the Medication Assistance Program is unable to dispense another inhaler and he is unable to afford inhaler through a local pharmacy.  Advised pt to use his Albuterol neb soln if needed but to PLEASE call the office for samples if needed until we can get his patient assistance approved.  Pt voiced his understanding.    1 Dulera 200 sample obtained for pt and documented per protocol No Spiriva or Nasonex samples Note to self to add Nasonex to the [facility] patient assistance forms when pt returns these to the office  Routing to MW to make him aware

## 2017-05-21 NOTE — Telephone Encounter (Signed)
Patient assistance forms received Routing to my inbox and will have TP sign

## 2017-05-25 ENCOUNTER — Other Ambulatory Visit: Payer: Self-pay

## 2017-05-25 MED ORDER — TIOTROPIUM BROMIDE MONOHYDRATE 18 MCG IN CAPS: 18.0000 ug | ORAL_CAPSULE | Freq: Every day | RESPIRATORY_TRACT | 3 refills | Status: DC

## 2017-05-25 NOTE — Telephone Encounter (Signed)
Rx reprinted for 90 day supply Patient assistance forms signed by TP and faxed to [place] and [employer] Will keep x1 month in case there is an issue

## 2017-05-26 ENCOUNTER — Ambulatory Visit: Payer: Self-pay | Admitting: Gastroenterology

## 2017-06-01 NOTE — Telephone Encounter (Signed)
LMTCB

## 2017-06-03 ENCOUNTER — Ambulatory Visit: Payer: Self-pay

## 2017-06-08 NOTE — Telephone Encounter (Signed)
Pt is calling back [phone]

## 2017-06-09 ENCOUNTER — Telehealth: Payer: Self-pay | Admitting: Internal Medicine

## 2017-06-09 NOTE — Telephone Encounter (Signed)
Patient aware that [employer] is still processing paperwork. Spoke with someone from [employer] and they said all paperwork from February 4th is still being processed. Patient given sample of Dulera, no samples of Spiriva at this time. Nothing further needed.

## 2017-06-12 ENCOUNTER — Ambulatory Visit (INDEPENDENT_AMBULATORY_CARE_PROVIDER_SITE_OTHER): Payer: No Typology Code available for payment source | Admitting: Internal Medicine

## 2017-06-12 ENCOUNTER — Encounter: Payer: Self-pay | Admitting: Internal Medicine

## 2017-06-12 VITALS — BP 116/80 | HR 96 | Ht 68.0 in | Wt 167.2 lb

## 2017-06-12 DIAGNOSIS — J449 Chronic obstructive pulmonary disease, unspecified: Secondary | ICD-10-CM

## 2017-06-12 DIAGNOSIS — J441 Chronic obstructive pulmonary disease with (acute) exacerbation: Secondary | ICD-10-CM

## 2017-06-12 MED ORDER — ALBUTEROL SULFATE HFA 108 (90 BASE) MCG/ACT IN AERS: 2.0000 | INHALATION_SPRAY | RESPIRATORY_TRACT | 0 refills | Status: DC | PRN

## 2017-06-12 MED ORDER — PANTOPRAZOLE SODIUM 40 MG PO TBEC: DELAYED_RELEASE_TABLET | ORAL | 5 refills | Status: DC

## 2017-06-12 MED ORDER — PREDNISONE 5 MG PO TABS: 5.0000 mg | ORAL_TABLET | Freq: Every day | ORAL | 5 refills | Status: DC
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
## 2017-06-12 NOTE — Patient Instructions (Signed)
Prednisone 10 mg one daily - if doing great then one half daily   See calendar for specific medication instructions and bring it back for each and every office visit for every healthcare provider you see.  Without it,  you may not receive the best quality medical care that we feel you deserve.  You will note that the calendar groups together  your maintenance  medications that are timed at particular times of the day.  Think of this as your checklist for what your doctor has instructed you to do until your next evaluation to see what benefit  there is  to staying on a consistent group of medications intended to keep you well.  The other group at the bottom is entirely up to you to use as you see fit  for specific symptoms that may arise between visits that require you to treat them on an as needed basis.  Think of this as your action plan or "what if" list.   Separating the top medications from the bottom group is fundamental to providing you adequate care going forward.     Please schedule a follow up visit in 3 months but call sooner if needed

## 2017-06-14 ENCOUNTER — Encounter: Payer: Self-pay | Admitting: Internal Medicine

## 2017-06-14 NOTE — Assessment & Plan Note (Addendum)
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 2008  - PFT's 2007  FEV1  1.78 (50%) ratio 52 and 19% resp to saba, dlco 75% - d/c coreg 09/03/2013  - spirometry 12/18/2014 >  FEV1 1.04 (34%) ratio 53     > trial of dulera 200 2bid and prednisone 5 mg daily   - Spiriva respimat added 05/01/2015  - max gerd rx1/01/2016 > improved 06/12/2015   - FENO 12/21/2015  =   9  p am symb 160 rx - Spirometry 12/21/2015  FEV1 1.33 (47%)  Ratio 53 p am symb 160/spiriva  - 04/15/2017  After extensive coaching HFA effectiveness =    90% from a baseline of 75%   - Spirometry 06/12/2017  FEV1 1.43 (49%)  Ratio 51 p am dulera but off spriva for a week    Doing much better now with no significant asthma component now than back on maint dulera and no need for spiriva > d/c for now using the sometimes less is more rule, esp in pts with limited insight or access to meds. Both of which strongly apply here.  I strongly suspect the reason he is steroid dep is non-adherence with his other meds  I had an extended discussion with the patient reviewing all relevant studies completed to date and  lasting 15 to 20 minutes of a 25 minute visit    Each maintenance medication was reviewed in detail including most importantly the difference between maintenance and prns and under what circumstances the prns are to be triggered using an action plan format that is not reflected in the computer generated alphabetically organized AVS but trather by a customized med calendar that reflects the AVS meds with confirmed 100% correlation.   In addition, Please see AVS for unique instructions that I personally wrote and verbalized to the the pt in detail and then reviewed with pt  by my nurse highlighting any  changes in therapy recommended at today's visit to their plan of care.

## 2017-06-23 MED FILL — metFORMIN HCL 1000 MG TABS: 1000 | 30 days supply | Qty: 60 | Fill #0

## 2017-06-25 ENCOUNTER — Telehealth: Payer: Self-pay | Admitting: Internal Medicine

## 2017-06-25 NOTE — Telephone Encounter (Signed)
Dawn from [place] returning call.  Cb is [phone]

## 2017-06-25 NOTE — Telephone Encounter (Signed)
Spoke to [employer] with health Dept, who states that she was made by our office that pt would be getting medication through patient assistance with [employer]. Dawn states she was advised that health dept assistance's would not be needed.  Dawn states health dept can provide pt with free medication, as he is uninsured.  Per 05/18/17 phone note patient assistance application was faxed to [employer].  I have contacted [employer], who states that there is no recent application on file for pt. Last application on file for pt is from 2016 per [employer].  I made [employer] aware that per our records application was faxed in on 05/25/17. I was advised that all applications must be mailed to [employer] and not faxed.    Per 05/13/17 OV note, pt has been without Dulera or Symbicort for 4 months due to cost, which is why [employer] patient assistance was started. I was able to locate application and mailed it to provided address.  Pt has been made aware of above patient and voiced his understanding.  Will route to [place] to f/u on

## 2017-06-25 NOTE — Telephone Encounter (Signed)
Spoke with pt, advised there are no samples of Dulera at this time.   Pt wanted me to call the [facility] and ask them about why he is not able to receive medications from them because he doesn't have insurance. He does have an orange card but I am not sure if this is the reason why. He is still awaiting patient assistance for [facility]. This is still in process. LM for the health dept pharmacy to call back.

## 2017-07-01 NOTE — Telephone Encounter (Signed)
Patient stopped by the office today to see if we had any samples. Advised patient that we did not and were still awaiting a response from [employer] regarding his application.   Advised patient that the [facility] Dept has samples and is willing to help him out since he does not have insurance. Patient stated that he will stop by there to see if they can help.   [employer] and spoke with [employer]. Advised her that the form had been mailed in on 06/25/17. She stated that it was a little too soon. She advised to call back maybe on Friday or Wednesday of next week. It normally takes 3-5 business days to process the applications.   Patient is aware of the status update.

## 2017-07-01 NOTE — Telephone Encounter (Signed)
Patient in the lobby needing samples of Dulera-pr

## 2017-07-01 NOTE — Telephone Encounter (Signed)
Jess - have you received anything on this pt's application?

## 2017-07-03 NOTE — Telephone Encounter (Addendum)
Called [employer] for update of application.  Mateo Flow with [employer] stated that application has not received as of yet. Mateo Flow recommended calling back on Monday, as this process takes 3-5 business days. Lm for pt to provide update.   [employer]'s contact number is [phone]

## 2017-07-03 NOTE — Telephone Encounter (Signed)
Spoke with pt. Advised him that we do not have samples at this time. Pt requests to have a prescription sent in. Rx has been sent in. Will leave message open to follow up on patient assistance.

## 2017-07-03 NOTE — Telephone Encounter (Signed)
Pt is in lobby again asking for samples of Dulera.Marland KitchenMarland Kitchen

## 2017-07-06 NOTE — Telephone Encounter (Signed)
Called and spoke with [employer] for updated status on application. Application still not received, advised to call back on Wednesday for update. Patient is aware.

## 2017-07-08 NOTE — Telephone Encounter (Signed)
Called [employer] and they have received the application. The address was missing so I provided this information. She advised that the application would go into approval tonight and he would receive assistance until 07/08/2018. I called pt to let him know to be on the lookout for the medications to come in the mail. Pt verbalized understanding. Nothing further is needed.

## 2017-07-09 NOTE — Progress Notes (Deleted)
   Subjective   Patient ID: [name]    DOB: 03-11-1961, 57 y.o. male   MRN: [id number]  CC: "***"  HPI: [name] is a 57 y.o. male who presents to clinic today for the following:  ***: ***  ***Never seen by me. Last seen 03/2017 for COPD exacerbation. Lots of HM.  ROS: see HPI for pertinent.  [place]: Uncontrolled NIDT2DM, COPD/asthma, CAD (s/p MI) with ICM, HTN, chronic sinusitis, cataracts. Surgical history stent 2008.  Family history DM, MI.  Smoking status reviewed. Medications reviewed.  Objective   There were no vitals taken for this visit. Vitals and nursing note reviewed.  General: well nourished, well developed, NAD with non-toxic appearance HEENT: normocephalic, atraumatic, moist mucous membranes Neck: supple, non-tender without lymphadenopathy Cardiovascular: regular rate and rhythm without murmurs, rubs, or gallops Lungs: clear to auscultation bilaterally with normal work of breathing Abdomen: soft, non-tender, non-distended, normoactive bowel sounds Skin: warm, dry, no rashes or lesions, cap refill < 2 seconds Extremities: warm and well perfused, normal tone, no edema  Assessment & Plan   No problem-specific Assessment & Plan notes found for this encounter.  No orders of the defined types were placed in this encounter.  No orders of the defined types were placed in this encounter.   Harriet [place], [place], PGY-2 07/09/2017, 9:35 AM

## 2017-07-10 ENCOUNTER — Encounter: Payer: No Typology Code available for payment source | Admitting: Family Medicine

## 2017-07-13 DIAGNOSIS — E782 Mixed hyperlipidemia: Secondary | ICD-10-CM | POA: Insufficient documentation

## 2017-07-13 DIAGNOSIS — E785 Hyperlipidemia, unspecified: Secondary | ICD-10-CM | POA: Insufficient documentation

## 2017-07-13 NOTE — Progress Notes (Signed)
Cardiology Office Note    Date:  07/14/2017   ID:  Vi Whitesel, DOB [birth year], MRN [id number]  PCP:  [place] Bing, DO  Cardiologist: Lauree Chandler, MD  Chief Complaint  Patient presents with  . Follow-up    History of Present Illness:  Americus Perkey is a 57 y.o. male with history of CAD status post anterior MI in 2008 treated with DES to the LAD.  LVEF 45% at that time.  Myoview 2010 no ischemia LVEF 40%.  Echo 04/2014 LVEF 40% apical hypokinesis, no LV thrombus.  Last saw Dr. Angelena Form 05/10/15 and was doing well.  Was not on a beta-blocker due to reactive airway disease.  Also has HLD, COPD with severe atopic asthma on/off daily steroids.  Comes in for follow-up.  It has been over 2 years.  He ran out of his Lipitor and Plavix.  He denies any chest pain, palpitations, dyspnea, dizziness or presyncope.  He walks on a treadmill about 15 minutes a day.  His blood pressure is up a little bit but when he is off steroids gets quite low.  He is trying to be weaned off in the next week.     Past Medical History:  Diagnosis Date  . Asthma   . Asthma   . CAD (coronary artery disease)   . Cellulitis of leg, right 10/2014  . Dental caries   . Diabetes mellitus    TYPE 2  . GERD (gastroesophageal reflux disease)   . Hyperlipidemia   . Myocardial infarction ([place]) 2008  . Thyroid nodule    biopsy negative 2006    Past Surgical History:  Procedure Laterality Date  . CORONARY STENT PLACEMENT    . ESOPHAGOGASTRODUODENOSCOPY N/A 03/29/2017   Procedure: ESOPHAGOGASTRODUODENOSCOPY (EGD);  Surgeon: Milus Banister, MD;  Location: [facility] ENDOSCOPY;  Service: Endoscopy;  Laterality: N/A;  . solitary pulm and thryoid nodules      Current Medications: Current Meds  Medication Sig  . albuterol (PROVENTIL HFA;VENTOLIN HFA) 108 (90 Base) MCG/ACT inhaler Inhale 2 puffs into the lungs every 4 (four) hours as needed for wheezing or shortness of breath (((PLAN B))).  Marland Kitchen albuterol  (PROVENTIL) (2.5 MG/3ML) 0.083% nebulizer solution Take 3 mLs (2.5 mg total) by nebulization every 6 (six) hours as needed for wheezing or shortness of breath. Dx J45.909  . aspirin (ADULT ASPIRIN EC LOW STRENGTH) 81 MG EC tablet Take 81 mg by mouth every morning.   Marland Kitchen atorvastatin (LIPITOR) 40 MG tablet Take 1 tablet (40 mg total) by mouth at bedtime. Please call our office to schedule an overdue yearly appointment before anymore refills. [phone]. Thank you 1st attempt  . calcium-vitamin D (OSCAL WITH D) 250-125 MG-UNIT tablet Take 1 tablet by mouth 2 (two) times daily.  . clopidogrel (PLAVIX) 75 MG tablet Take 1 tablet (75 mg total) by mouth every morning.  Marland Kitchen Dextromethorphan Polistirex (DELSYM PO) Take by mouth.  Marland Kitchen guaiFENesin (MUCINEX) 600 MG 12 hr tablet Take by mouth 2 (two) times daily.  Marland Kitchen losartan (COZAAR) 50 MG tablet Take 1 tablet (50 mg total) by mouth daily.  . metFORMIN (GLUCOPHAGE) 1000 MG tablet Take 1 tablet (1,000 mg total) by mouth 2 (two) times daily with a meal.  . mometasone (NASONEX) 50 MCG/ACT nasal spray Place 2 sprays into the nose daily as needed (Allergies).  . mometasone-formoterol (DULERA) 200-5 MCG/ACT AERO Inhale 2 puffs into the lungs 2 (two) times daily.  . Multiple Vitamin (MULTIVITAMIN) tablet Take 1 tablet by mouth  every morning.   . pantoprazole (PROTONIX) 40 MG tablet Take 30- 60 min before your first and last meals of the day  . predniSONE (DELTASONE) 5 MG tablet Take 1-2 tablets (5-10 mg total) by mouth daily with breakfast.     Allergies:   Patient has no known allergies.   Social History   Socioeconomic History  . Marital status: Married    Spouse name: Not on file  . Number of children: Not on file  . Years of education: Not on file  . Highest education level: Not on file  Occupational History  . Not on file  Social Needs  . Financial resource strain: Not on file  . Food insecurity:    Worry: Not on file    Inability: Not on file  .  Transportation needs:    Medical: Not on file    Non-medical: Not on file  Tobacco Use  . Smoking status: Former Smoker    Packs/day: 0.50    Years: 22.00    Pack years: 11.00    Types: Cigarettes    Last attempt to quit: 04/21/2006    Years since quitting: 11.2  . Smokeless tobacco: Never Used  . Tobacco comment: quit 10 years ago  Substance and Sexual Activity  . Alcohol use: No    Alcohol/week: 0.0 oz  . Drug use: No  . Sexual activity: Yes    Birth control/protection: Condom  Lifestyle  . Physical activity:    Days per week: Not on file    Minutes per session: Not on file  . Stress: Not on file  Relationships  . Social connections:    Talks on phone: Not on file    Gets together: Not on file    Attends religious service: Not on file    Active member of club or organization: Not on file    Attends meetings of clubs or organizations: Not on file    Relationship status: Not on file  Other Topics Concern  . Not on file  Social History Narrative   Lives with wife; recently moved to [place] from [place] due to health problems. Has no means of affording meds currently + tobacco 1/2ppd x 31yrs, no ETOH no drugs.     Family History:  The patient's family history includes Diabetes in his mother; Heart attack (age of onset: 98) in his brother.   ROS:   Please see the history of present illness.    Review of Systems  Constitution: Negative.  HENT: Negative.   Cardiovascular: Negative.   Respiratory: Negative.   Endocrine: Negative.   Hematologic/Lymphatic: Negative.   Musculoskeletal: Negative.   Gastrointestinal: Negative.   Genitourinary: Negative.   Neurological: Negative.    All other systems reviewed and are negative.   PHYSICAL EXAM:   VS:  BP (!) 158/88   Pulse 94   Ht 5\' 8"  (1.727 m)   Wt 162 lb (73.5 kg)   SpO2 97%   BMI 24.63 kg/m   Physical Exam  GEN: Well nourished, well developed, in no acute distress  Neck: no JVD, carotid bruits, or masses Cardiac:RRR;  no murmurs, rubs, or gallops  Respiratory:  clear to auscultation bilaterally, normal work of breathing GI: soft, nontender, nondistended, + BS Ext: without cyanosis, clubbing, or edema, Good distal pulses bilaterally Neuro:  Alert and Oriented x 3,  Psych: euthymic mood, full affect  Wt Readings from Last 3 Encounters:  07/14/17 162 lb (73.5 kg)  06/12/17 167 lb 3.2 oz (  75.8 kg)  05/13/17 159 lb 9.6 oz (72.4 kg)      Studies/Labs Reviewed:   EKG:  EKG is ordered today.  The ekg ordered today demonstrates normal sinus rhythm prior anterior infarct  Recent Labs: 03/28/2017: BUN 19; Creatinine, Ser 1.02; Hemoglobin 12.1; Platelets 252; Potassium 4.4; Sodium 137   Lipid Panel    Component Value Date/Time   CHOL 110 (L) 05/10/2015 0841   TRIG 94 05/10/2015 0841   HDL 54 05/10/2015 0841   CHOLHDL 2.0 05/10/2015 0841   VLDL 19 05/10/2015 0841   LDLCALC 37 05/10/2015 0841   LDLDIRECT 98 07/28/2012 1701    Additional studies/ records that were reviewed today include:     Echo January 2016: Left ventricle: The cavity size was normal. Wall thickness was   increased in a pattern of mild LVH. There was mild focal basal   hypertrophy of the septum. Systolic function was moderately   reduced. The estimated ejection fraction was in the range of 35%   to 40%. There is akinesis of the anteroseptal and apical   myocardium. Doppler parameters are consistent with abnormal left   ventricular relaxation (grade 1 diastolic dysfunction).  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 Definity used; no evidence of apical thrombus.    ASSESSMENT:    1. Atherosclerosis of native coronary artery of native heart without angina pectoris   2. Cardiomyopathy, ischemic   3. Primary hypertension   4. COPD with asthma ([place])   5. Uncontrolled type 2 diabetes mellitus, without  long-term current use of insulin ([place])   6. Mixed hyperlipidemia      PLAN:  In order of problems listed above:   CAD status post anterior MI 2008 treated with DES to the LAD-patient ran out of his Plavix and Lipitor.  No angina will renew.  Will discuss with Dr. Angelena Form the need for Plavix as he's been on it since his procedure was in 2008.  We will continue for now.  Follow-up with Dr. Angelena Form in 1 year.  Ischemic cardiomyopathy ejection fraction 40% on last echo 04/2014-no evidence of CHF.  Primary hypertension blood pressure is up today and occasionally at home.  Patient checks it every other day.  When he is off steroids he gets quite low.  Blood pressure Dr. Gustavus Bryant office recently normal.  We will not change his medications today.  COPD with asthma followed closely by Dr. Melvyn Novas on chronic steroids   Uncontrolled diabetes mellitus-Hbg AIC 7.4-04/2016 followed by primary care.  Hyperlipidemia last lipids checked in 2017 check fasting lipid panel today and refill Lipitor that he has been out of for a month.   Medication Adjustments/Labs and Tests Ordered: Current medicines are reviewed at length with the patient today.  Concerns regarding medicines are outlined above.  Medication changes, Labs and Tests ordered today are listed in the Patient Instructions below. There are no Patient Instructions on file for this visit.   Signed, Ermalinda Barrios, PA-C  07/14/2017 9:22 AM    [facility] [address], [place], [place]  [address] Phone: [phone]; Fax: [phone]

## 2017-07-14 ENCOUNTER — Ambulatory Visit (INDEPENDENT_AMBULATORY_CARE_PROVIDER_SITE_OTHER): Payer: No Typology Code available for payment source | Admitting: Physician Assistant

## 2017-07-14 ENCOUNTER — Encounter: Payer: Self-pay | Admitting: Physician Assistant

## 2017-07-14 VITALS — BP 158/88 | HR 94 | Ht 68.0 in | Wt 162.0 lb

## 2017-07-14 DIAGNOSIS — I255 Ischemic cardiomyopathy: Secondary | ICD-10-CM

## 2017-07-14 DIAGNOSIS — IMO0002 Reserved for concepts with insufficient information to code with codable children: Secondary | ICD-10-CM

## 2017-07-14 DIAGNOSIS — I10 Essential (primary) hypertension: Secondary | ICD-10-CM

## 2017-07-14 DIAGNOSIS — E1165 Type 2 diabetes mellitus with hyperglycemia: Secondary | ICD-10-CM

## 2017-07-14 DIAGNOSIS — I2510 Atherosclerotic heart disease of native coronary artery without angina pectoris: Secondary | ICD-10-CM

## 2017-07-14 DIAGNOSIS — E782 Mixed hyperlipidemia: Secondary | ICD-10-CM

## 2017-07-14 DIAGNOSIS — J449 Chronic obstructive pulmonary disease, unspecified: Secondary | ICD-10-CM

## 2017-07-14 LAB — HEPATIC FUNCTION PANEL
ALK PHOS: 48 IU/L (ref 39–117)
ALT: 15 IU/L (ref 0–44)
AST: 13 IU/L (ref 0–40)
Albumin: 4.2 g/dL (ref 3.5–5.5)
BILIRUBIN TOTAL: 0.2 mg/dL (ref 0.0–1.2)
Bilirubin, Direct: 0.11 mg/dL (ref 0.00–0.40)
Total Protein: 7.2 g/dL (ref 6.0–8.5)

## 2017-07-14 LAB — LIPID PANEL
CHOLESTEROL TOTAL: 147 mg/dL (ref 100–199)
Chol/HDL Ratio: 3.1 ratio (ref 0.0–5.0)
HDL: 47 mg/dL (ref >39)
LDL CALC: 73 mg/dL (ref 0–99)
TRIGLYCERIDES: 137 mg/dL (ref 0–149)
VLDL Cholesterol Cal: 27 mg/dL (ref 5–40)

## 2017-07-14 MED ORDER — ATORVASTATIN CALCIUM 40 MG PO TABS: 40.0000 mg | ORAL_TABLET | Freq: Every day | ORAL | 3 refills | Status: DC

## 2017-07-14 MED ORDER — CLOPIDOGREL BISULFATE 75 MG PO TABS: 75.0000 mg | ORAL_TABLET | ORAL | 3 refills | Status: DC

## 2017-07-14 MED ORDER — LOSARTAN POTASSIUM 50 MG PO TABS: 50.0000 mg | ORAL_TABLET | Freq: Every day | ORAL | 3 refills | Status: DC

## 2017-07-14 NOTE — Patient Instructions (Signed)
Medication Instructions:  Your physician recommends that you continue on your current medications as directed. Please refer to the Current Medication list given to you today.   Labwork: TODAY:  LIPID & LFT  Testing/Procedures: None ordered  Follow-Up: Your physician wants you to follow-up in: [place] will receive a reminder letter in the mail two months in advance. If you don't receive a letter, please call our office to schedule the follow-up appointment.   Any Other Special Instructions Will Be Listed Below (If Applicable).     If you need a refill on your cardiac medications before your next appointment, please call your pharmacy.

## 2017-07-21 ENCOUNTER — Ambulatory Visit (INDEPENDENT_AMBULATORY_CARE_PROVIDER_SITE_OTHER): Payer: Self-pay | Admitting: Gastroenterology

## 2017-07-21 ENCOUNTER — Telehealth: Payer: Self-pay | Admitting: Internal Medicine

## 2017-07-21 ENCOUNTER — Telehealth: Payer: Self-pay | Admitting: Gastroenterology

## 2017-07-21 ENCOUNTER — Encounter: Payer: Self-pay | Admitting: Gastroenterology

## 2017-07-21 VITALS — BP 120/80 | HR 84

## 2017-07-21 DIAGNOSIS — R1310 Dysphagia, unspecified: Secondary | ICD-10-CM

## 2017-07-21 NOTE — Telephone Encounter (Signed)
Called patient unable to reach left message to give us a call back.

## 2017-07-21 NOTE — Progress Notes (Signed)
Review of pertinent gastrointestinal problems: 1.  Esophageal foreign body impaction, December 2018.  Metformin pill stuck in the middle of his esophagus in the setting of Candida yeast infection of the esophagus.  Likely daily, chronic prednisone use is responsible for immunosuppression, leading to yeast infection.  EGD Dr. Ardis Hughs December 2018; crush the pill.  He was put on Diflucan for 10 days afterwards    HPI: This is a very pleasant 57 year old man whom I last saw about 3 or 4 months ago at the time of an EGD for esophageal foreign body removal.  He had Candida infection confirmed at that time as well.  He completed his Diflucan course and is much improved but still has intermittent dysphasia, somewhat choking type episodes.  Especially on small pieces of rice.  He is afraid to take his pills.  He does think he has lost a few pounds since then.  He continues to take over-the-counter pro-time pump inhibitor once daily.  Takes pantoprazole once daily.    Chief complaint is   dysphasia, intermittent choking  ROS: complete GI ROS as described in HPI, all other review negative.  Constitutional:  No unintentional weight loss   Past Medical History:  Diagnosis Date  . Asthma   . Asthma   . CAD (coronary artery disease)   . Cellulitis of leg, right 10/2014  . Dental caries   . Diabetes mellitus    TYPE 2  . GERD (gastroesophageal reflux disease)   . Hyperlipidemia   . Myocardial infarction ([place]) 2008  . Thyroid nodule    biopsy negative 2006    Past Surgical History:  Procedure Laterality Date  . CORONARY STENT PLACEMENT    . ESOPHAGOGASTRODUODENOSCOPY N/A 03/29/2017   Procedure: ESOPHAGOGASTRODUODENOSCOPY (EGD);  Surgeon: Milus Banister, MD;  Location: [facility] ENDOSCOPY;  Service: Endoscopy;  Laterality: N/A;  . solitary pulm and thryoid nodules      Current Outpatient Medications  Medication Sig Dispense Refill  . albuterol (PROVENTIL HFA;VENTOLIN HFA) 108 (90 Base) MCG/ACT  inhaler Inhale 2 puffs into the lungs every 4 (four) hours as needed for wheezing or shortness of breath (((PLAN B))). 3 Inhaler 0  . albuterol (PROVENTIL) (2.5 MG/3ML) 0.083% nebulizer solution Take 3 mLs (2.5 mg total) by nebulization every 6 (six) hours as needed for wheezing or shortness of breath. Dx J45.909 75 mL 5  . aspirin (ADULT ASPIRIN EC LOW STRENGTH) 81 MG EC tablet Take 81 mg by mouth every morning.     Marland Kitchen atorvastatin (LIPITOR) 40 MG tablet Take 1 tablet (40 mg total) by mouth at bedtime. 90 tablet 3  . calcium-vitamin D (OSCAL WITH D) 250-125 MG-UNIT tablet Take 1 tablet by mouth 2 (two) times daily.    . clopidogrel (PLAVIX) 75 MG tablet Take 1 tablet (75 mg total) by mouth every morning. 90 tablet 3  . Dextromethorphan Polistirex (DELSYM PO) Take by mouth.    Marland Kitchen guaiFENesin (MUCINEX) 600 MG 12 hr tablet Take by mouth 2 (two) times daily.    Marland Kitchen losartan (COZAAR) 50 MG tablet Take 1 tablet (50 mg total) by mouth daily. 90 tablet 3  . metFORMIN (GLUCOPHAGE) 1000 MG tablet Take 1 tablet (1,000 mg total) by mouth 2 (two) times daily with a meal. 60 tablet 3  . mometasone-formoterol (DULERA) 200-5 MCG/ACT AERO Inhale 2 puffs into the lungs 2 (two) times daily. 1 Inhaler 5  . Multiple Vitamin (MULTIVITAMIN) tablet Take 1 tablet by mouth every morning.     . pantoprazole (  PROTONIX) 40 MG tablet Take 30- 60 min before your first and last meals of the day 60 tablet 5  . predniSONE (DELTASONE) 5 MG tablet Take 1-2 tablets (5-10 mg total) by mouth daily with breakfast. 100 tablet 5   No current facility-administered medications for this visit.     Allergies as of 07/21/2017  . (No Known Allergies)    Family History  Problem Relation Age of Onset  . Diabetes Mother   . Heart attack Brother 13    Social History   Socioeconomic History  . Marital status: Married    Spouse name: Not on file  . Number of children: Not on file  . Years of education: Not on file  . Highest education  level: Not on file  Occupational History  . Not on file  Social Needs  . Financial resource strain: Not on file  . Food insecurity:    Worry: Not on file    Inability: Not on file  . Transportation needs:    Medical: Not on file    Non-medical: Not on file  Tobacco Use  . Smoking status: Former Smoker    Packs/day: 0.50    Years: 22.00    Pack years: 11.00    Types: Cigarettes    Last attempt to quit: 04/21/2006    Years since quitting: 11.2  . Smokeless tobacco: Never Used  . Tobacco comment: quit 10 years ago  Substance and Sexual Activity  . Alcohol use: No    Alcohol/week: 0.0 oz  . Drug use: No  . Sexual activity: Yes    Birth control/protection: Condom  Lifestyle  . Physical activity:    Days per week: Not on file    Minutes per session: Not on file  . Stress: Not on file  Relationships  . Social connections:    Talks on phone: Not on file    Gets together: Not on file    Attends religious service: Not on file    Active member of club or organization: Not on file    Attends meetings of clubs or organizations: Not on file    Relationship status: Not on file  . Intimate partner violence:    Fear of current or ex partner: Not on file    Emotionally abused: Not on file    Physically abused: Not on file    Forced sexual activity: Not on file  Other Topics Concern  . Not on file  Social History Narrative   Lives with wife; recently moved to [place] from [place] due to health problems. Has no means of affording meds currently + tobacco 1/2ppd x 74yrs, no ETOH no drugs.     Physical Exam: BP 120/80   Pulse 84  Constitutional: generally well-appearing Mouth and throat: No candidiasis observed Psychiatric: alert and oriented x3 Abdomen: soft, nontender, nondistended, no obvious ascites, no peritoneal signs, normal bowel sounds No peripheral edema noted in lower extremities  Assessment and plan: 57 y.o. male with dysphasia, intermittent choking  Perhaps he has  recurrent esophageal candidiasis however he does not have any in his oropharynx today.  He has lost little weight is describing intermittent dysphasia with some choking.  Perhaps he had a small stricture that was not very obvious in the setting of acute Candida infection.  Recommend repeat EGD at his soonest convenience.  He will have to hold his Plavix for 5 days prior case we need to dilate.  We will get that cleared by his cardiologist.  Please see the "Patient Instructions" section for addition details about the plan.  Owens Loffler, MD [facility] 07/21/2017, 3:37 PM

## 2017-07-21 NOTE — Patient Instructions (Addendum)
EGD with possible dilation for dysphagia.  Normal BMI (Body Mass Index- based on height and weight) is between 19 and 25. Your BMI today is There is no height or weight on file to calculate BMI. Marland Kitchen Please consider follow up  regarding your BMI with your Primary Care Provider.

## 2017-07-21 NOTE — Telephone Encounter (Signed)
[facility] Pre-operative Risk Assessment     Request for surgical clearance:     Endoscopy Procedure  What type of surgery is being performed?     EGD  When is this surgery scheduled?     08/25/17  What type of clearance is required ?   Pharmacy  Are there any medications that need to be held prior to surgery and how long? Plavix 5 days  Practice name and name of physician performing surgery?      [facility]  What is your office phone and fax number?      Phone- [phone]  [phone]  Anesthesia type (None, local, MAC, general) ?       MAC

## 2017-07-22 NOTE — Telephone Encounter (Signed)
lmtcb x2 for pt. Pt needs to call the patient assistance program for an update on his shipment.

## 2017-07-22 NOTE — Telephone Encounter (Signed)
   Primary Cardiologist: Lauree Chandler, MD  Chart reviewed as part of pre-operative protocol coverage. Pt recently seen in clinic.  He had actually run out of his plavix and had been off of it for a period of time but was otherwise doing well w/o chest pain. Plavix was resumed @ that visit.  Based on ACC/AHA guidelines, [name] would be at acceptable risk for the planned procedure without further cardiovascular testing. He may come off of his plavix 5 days prior to the scheduled procedure date (last PCI 2008).  I will route this recommendation to the requesting party via Epic fax function and remove from pre-op pool.  Please call with questions.  Murray Hodgkins, NP 07/22/2017, 3:43 PM

## 2017-07-24 NOTE — Telephone Encounter (Signed)
lmcb x3 for pt.

## 2017-07-27 ENCOUNTER — Telehealth: Payer: Self-pay

## 2017-07-27 NOTE — Telephone Encounter (Signed)
We have attempted to contact the pt several times with no success or call back from the pt. Per triage protocol, message will be closed.  

## 2017-07-27 NOTE — Telephone Encounter (Signed)
Spoke to patient to inform him of his upcoming appointment for EGD and to hold plavix for 5 days prior to procedure. He said he needed to reschedule his appointment. A new appointment was made for 09/25/17 at 4pm Patient voiced understanding to hold plavix for 5 days prior to this appointment.

## 2017-07-27 NOTE — Telephone Encounter (Signed)
Left message on patients voce mail to call office regarding upcoming procedure and stop plavix for 5 days. Will continue to call patient.

## 2017-08-13 NOTE — Progress Notes (Signed)
Subjective   Patient ID: [name]    DOB: 04-Aug-1960, 57 y.o. male   MRN: [id number]  CC: "Diabetes"  HPI: [name] is a 57 y.o. male who presents to clinic today for the following:  Diabetes: Patient without history of insulin. Tolerating metformin for many years. Does not check CBGs. Making efforts to avoid excessive carbohydrate intake. Has not had annual eye exam in 4 years.   Cataracts: Not evaluated in 4 years. Says he has been having a difficult time seeing at night and avoids driving at night. Wears glasses.   Colon cancer screening: No prior screen. Denies melena or hematochezia.  HCV screening: Has no prior history of screening. Denies nausea or vomiting, SOB, CP, abdominal pain, diarrhea, constipation, pale stools, pruritis.  ROS: see HPI for pertinent.  [place]: Uncontrolled T2DM, HTN, h/o MI, CAD with ICM, COPD with asthma, cataracts, dental caries. Smoking status reviewed.  Surgical history stent.  Family history DM.  Medications reviewed.  Objective   BP 122/68   Pulse 92   Temp 98.4 F (36.9 C) (Oral)   Wt 160 lb 9.6 oz (72.8 kg)   SpO2 97%   BMI 24.42 kg/m  Vitals and nursing note reviewed.  General: well nourished, well developed, NAD with non-toxic appearance HEENT: normocephalic, atraumatic, moist mucous membranes, no jaundice, cataracts present, unable to visualize for papillary edema Neck: supple, non-tender without lymphadenopathy Cardiovascular: regular rate and rhythm without murmurs, rubs, or gallops Lungs: clear to auscultation bilaterally with normal work of breathing Abdomen: soft, non-tender, non-distended, normoactive bowel sounds Skin: warm, dry, no rashes or lesions, cap refill < 2 seconds Extremities: warm and well perfused, normal tone, no edema, no calus or loss of sensation or motior function of feet bilaterally, 2+ dorsal pedal pulse  Assessment & Plan   Uncontrolled type 2 diabetes mellitus, without long-term current use of  insulin (HCC) Chronic. Suboptimal control. A1c 7.1. On metformin only. Has known cataract and likely macular edema. Not seen by ophthalmologist on over 4 years. On aspirin, statin, ARB. - Continue metformin 1000 mg twice daily - Next A1c due 726/19 - Referral to ophthalmology for annual diabetic eye exam - Checking CBC with diff, CMET, LDL, TSH - Administered PPNV-23 and performed annual foot exam - RTC 3 months  Cataracts, bilateral Chronic. Bilateral involvement. Not seen by ophthalmologist in over 4 years. - Amb referral to ophthalmologist  Screening for viral disease No prior HCV screen per chart review. Patient without known exposure or risk factors. - Screen HCV antibody with reflex  Screening for colorectal cancer No prior colonoscopy. No family history of colorectal cancer. Needs referral given orange card status. No rectal bleeding. - Amb referral to gastroenterology for screening colonoscopy  Orders Placed This Encounter  Procedures  . Pneumococcal polysaccharide vaccine 23-valent greater than or equal to 2yo subcutaneous/IM  . Hepatitis C antibody  . CBC with Differential/Platelet  . CMP14+EGFR  . LDL cholesterol, direct  . TSH  . Ambulatory referral to Gastroenterology    Referral Priority:   Routine    Referral Type:   Consultation    Referral Reason:   Specialty Services Required    Number of Visits Requested:   1  . Ambulatory referral to Ophthalmology    Referral Priority:   Routine    Referral Type:   Consultation    Referral Reason:   Specialty Services Required    Requested Specialty:   Ophthalmology    Number of Visits Requested:   1  .  HgB A1c   Meds ordered this encounter  Medications  . metFORMIN (GLUCOPHAGE) 1000 MG tablet    Sig: Take 1 tablet (1,000 mg total) by mouth 2 (two) times daily with a meal.    Dispense:  180 tablet    Refill:  [place], DO [place], PGY-2 08/16/2017, 8:02 PM

## 2017-08-14 ENCOUNTER — Ambulatory Visit (INDEPENDENT_AMBULATORY_CARE_PROVIDER_SITE_OTHER): Payer: Self-pay | Admitting: Family Medicine

## 2017-08-14 ENCOUNTER — Encounter: Payer: Self-pay | Admitting: Family Medicine

## 2017-08-14 ENCOUNTER — Other Ambulatory Visit: Payer: Self-pay

## 2017-08-14 ENCOUNTER — Telehealth: Payer: Self-pay | Admitting: *Deleted

## 2017-08-14 VITALS — BP 122/68 | HR 92 | Temp 98.4°F | Wt 160.6 lb

## 2017-08-14 DIAGNOSIS — Z23 Encounter for immunization: Secondary | ICD-10-CM

## 2017-08-14 DIAGNOSIS — IMO0002 Reserved for concepts with insufficient information to code with codable children: Secondary | ICD-10-CM

## 2017-08-14 DIAGNOSIS — Z1212 Encounter for screening for malignant neoplasm of rectum: Secondary | ICD-10-CM

## 2017-08-14 DIAGNOSIS — Z1211 Encounter for screening for malignant neoplasm of colon: Secondary | ICD-10-CM

## 2017-08-14 DIAGNOSIS — Z7184 Encounter for health counseling related to travel: Secondary | ICD-10-CM | POA: Insufficient documentation

## 2017-08-14 DIAGNOSIS — Z1159 Encounter for screening for other viral diseases: Secondary | ICD-10-CM

## 2017-08-14 DIAGNOSIS — H259 Unspecified age-related cataract: Secondary | ICD-10-CM

## 2017-08-14 DIAGNOSIS — E1165 Type 2 diabetes mellitus with hyperglycemia: Secondary | ICD-10-CM

## 2017-08-14 LAB — POCT GLYCOSYLATED HEMOGLOBIN (HGB A1C): Hemoglobin A1C: 7.1

## 2017-08-14 MED ORDER — METFORMIN HCL 1000 MG PO TABS: 1000.0000 mg | ORAL_TABLET | Freq: Two times a day (BID) | ORAL | 3 refills | Status: DC

## 2017-08-14 MED FILL — LOSARTAN POTASSIUM 50 MG TA: 50 | 30 days supply | Qty: 30 | Fill #0

## 2017-08-14 MED FILL — CLOPIDOGREL 75 MG TABLET: 75 | 30 days supply | Qty: 30 | Fill #0

## 2017-08-14 MED FILL — ATORVASTATIN 40 MG TABLET: 40 | 30 days supply | Qty: 30 | Fill #0

## 2017-08-14 NOTE — Telephone Encounter (Signed)
Patient is at [facility] to pick up prescriptions for Lipitor 40 mg daily, Cozaar 50 mg daily, and Plavix 75 mg daily.  Patient has an orange card and pharmacy does not accept these for meds.  Patient would need to go to [place] Program for assistance.  Will fill prescriptions this time for one month supply each and bill to [employer].  For future refills, patient will need to go to MAP program.  Will inform Dr. Yisroel Ramming.  Burna Forts, BSN, RN-BC

## 2017-08-14 NOTE — Patient Instructions (Addendum)
Thank you for coming in to see [place] today. Please see below to review our plan for today's visit.  1.  I will call you if your labs are abnormal. 2.  Expect to receive a phone call regarding your colonoscopy and eye exam appointments. 3.  I sent in a refill of your metformin. 4.  Return to the clinic in 3 months. 5.  We will work on getting you establish with the medication assistance program.  Please call the clinic at [phone] if your symptoms worsen or you have any concerns. It was our pleasure to serve you.  Harriet Butte, [place], PGY-2

## 2017-08-14 NOTE — Telephone Encounter (Signed)
I discussed this with the patient.  He should begin the process of starting the medication assistance program.  He states he has all these medications and only requested to have metformin refilled (I am assuming because you fill these earlier today).  Sent prescription to [facility] upon his request.  Harriet Butte, [place], PGY-2

## 2017-08-15 LAB — CMP14+EGFR
ALT: 14 IU/L (ref 0–44)
AST: 14 IU/L (ref 0–40)
Albumin/Globulin Ratio: 1.5 (ref 1.2–2.2)
Albumin: 4.1 g/dL (ref 3.5–5.5)
Alkaline Phosphatase: 50 IU/L (ref 39–117)
BUN/Creatinine Ratio: 15 (ref 9–20)
BUN: 14 mg/dL (ref 6–24)
CALCIUM: 9.5 mg/dL (ref 8.7–10.2)
CHLORIDE: 100 mmol/L (ref 96–106)
CO2: 22 mmol/L (ref 20–29)
CREATININE: 0.95 mg/dL (ref 0.76–1.27)
GFR calc Af Amer: 102 mL/min/{1.73_m2} (ref >59)
GFR calc non Af Amer: 88 mL/min/{1.73_m2} (ref >59)
GLUCOSE: 141 mg/dL — AB (ref 65–99)
Globulin, Total: 2.7 g/dL (ref 1.5–4.5)
Potassium: 4.3 mmol/L (ref 3.5–5.2)
Sodium: 144 mmol/L (ref 134–144)
Total Protein: 6.8 g/dL (ref 6.0–8.5)

## 2017-08-15 LAB — CBC WITH DIFFERENTIAL/PLATELET
BASOS ABS: 0.1 10*3/uL (ref 0.0–0.2)
Basos: 1 %
EOS (ABSOLUTE): 0.7 10*3/uL — AB (ref 0.0–0.4)
Eos: 5 %
Hematocrit: 34.8 % — ABNORMAL LOW (ref 37.5–51.0)
Hemoglobin: 10.9 g/dL — ABNORMAL LOW (ref 13.0–17.7)
IMMATURE GRANS (ABS): 0.1 10*3/uL (ref 0.0–0.1)
IMMATURE GRANULOCYTES: 1 %
LYMPHS: 14 %
Lymphocytes Absolute: 2.1 10*3/uL (ref 0.7–3.1)
MCH: 20 pg — AB (ref 26.6–33.0)
MCHC: 31.3 g/dL — ABNORMAL LOW (ref 31.5–35.7)
MCV: 64 fL — ABNORMAL LOW (ref 79–97)
MONOCYTES: 7 %
Monocytes Absolute: 1 10*3/uL — ABNORMAL HIGH (ref 0.1–0.9)
NEUTROS PCT: 72 %
Neutrophils Absolute: 11.3 10*3/uL — ABNORMAL HIGH (ref 1.4–7.0)
PLATELETS: 321 10*3/uL (ref 150–379)
RBC: 5.45 x10E6/uL (ref 4.14–5.80)
RDW: 17 % — ABNORMAL HIGH (ref 12.3–15.4)
WBC: 15.3 10*3/uL — ABNORMAL HIGH (ref 3.4–10.8)

## 2017-08-15 LAB — LDL CHOLESTEROL, DIRECT: LDL Direct: 91 mg/dL (ref 0–99)

## 2017-08-15 LAB — TSH: TSH: 1.09 u[IU]/mL (ref 0.450–4.500)

## 2017-08-15 LAB — HEPATITIS C ANTIBODY

## 2017-08-16 NOTE — Assessment & Plan Note (Signed)
Chronic. Bilateral involvement. Not seen by ophthalmologist in over 4 years. - Amb referral to ophthalmologist

## 2017-08-16 NOTE — Assessment & Plan Note (Signed)
No prior HCV screen per chart review. Patient without known exposure or risk factors. - Screen HCV antibody with reflex

## 2017-08-16 NOTE — Assessment & Plan Note (Signed)
No prior colonoscopy. No family history of colorectal cancer. Needs referral given orange card status. No rectal bleeding. - Amb referral to gastroenterology for screening colonoscopy

## 2017-08-16 NOTE — Assessment & Plan Note (Addendum)
Chronic. Suboptimal control. A1c 7.1. On metformin only. Has known cataract and likely macular edema. Not seen by ophthalmologist on over 4 years. On aspirin, statin, ARB. - Continue metformin 1000 mg twice daily - Next A1c due 726/19 - Referral to ophthalmology for annual diabetic eye exam - Checking CBC with diff, CMET, LDL, TSH - Administered PPNV-23 and performed annual foot exam - RTC 3 months

## 2017-08-17 ENCOUNTER — Telehealth: Payer: Self-pay | Admitting: Family Medicine

## 2017-08-17 NOTE — Telephone Encounter (Signed)
Contacted patient regarding anemia.  Patient denied rectal bleeding or hematuria during his previous office visit.  Patient continues to be without active bleeding.  Emphasized the importance of following up with the colonoscopy referral.  Referral was placed due to patient's orange card status.  This is a screening colonoscopy.  Instructed patient to contact clinic if he does not hear back within the week.  Harriet Butte, [place], PGY-2

## 2017-08-25 ENCOUNTER — Encounter: Payer: Self-pay | Admitting: Gastroenterology

## 2017-09-11 ENCOUNTER — Ambulatory Visit: Payer: No Typology Code available for payment source | Admitting: Internal Medicine

## 2017-09-15 ENCOUNTER — Ambulatory Visit (INDEPENDENT_AMBULATORY_CARE_PROVIDER_SITE_OTHER): Payer: No Typology Code available for payment source | Admitting: Internal Medicine

## 2017-09-15 ENCOUNTER — Encounter: Payer: Self-pay | Admitting: Internal Medicine

## 2017-09-15 VITALS — BP 152/72 | HR 91 | Ht 68.0 in | Wt 158.4 lb

## 2017-09-15 DIAGNOSIS — J45909 Unspecified asthma, uncomplicated: Secondary | ICD-10-CM

## 2017-09-15 DIAGNOSIS — J449 Chronic obstructive pulmonary disease, unspecified: Secondary | ICD-10-CM

## 2017-09-15 MED ORDER — ALBUTEROL SULFATE (2.5 MG/3ML) 0.083% IN NEBU: 2.5000 mg | INHALATION_SOLUTION | Freq: Four times a day (QID) | RESPIRATORY_TRACT | 5 refills | Status: DC | PRN
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
## 2017-09-15 NOTE — Patient Instructions (Signed)
Plan A = Automatic = symbicort 160 Take 2 puffs first thing in am and then another 2 puffs about 12 hours later.    Plan B = Backup Only use your albuterol as a rescue medication to be used if you can't catch your breath by resting or doing a relaxed purse lip breathing pattern.  - The less you use it, the better it will work when you need it. - Ok to use the inhaler up to 2 puffs  every 4 hours if you must but call for appointment if use goes up over your usual need - Don't leave home without it !!  (think of it like the spare tire for your car)   Plan C = Crisis - only use your albuterol nebulizer if you first try Plan B and it fails to help > ok to use the nebulizer up to every 4 hours but if start needing it regularly call for immediate appointment   Plan D = Deltasone 10 mg  Double the dose until better then 1 daily

## 2017-09-16 ENCOUNTER — Encounter: Payer: Self-pay | Admitting: Internal Medicine

## 2017-09-16 NOTE — Assessment & Plan Note (Addendum)
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 2008  - PFT's 2007  FEV1  1.78 (50%) ratio 52 and 19% resp to saba, dlco 75% - d/c coreg 09/03/2013  - spirometry 12/18/2014 >  FEV1 1.04 (34%) ratio 53     > trial of dulera 200 2bid and prednisone 5 mg daily   - Spiriva respimat added 05/01/2015  - max gerd rx1/01/2016 > improved 06/12/2015   - FENO 12/21/2015  =   9  p am symb 160 rx - Spirometry 12/21/2015  FEV1 1.33 (47%)  Ratio 53 p am symb 160/spiriva  - Spirometry 06/12/2017  FEV1 1.43 (49%)  Ratio 51 p dulera and off spiriva xone week so leave off spriva  - 09/15/2017  After extensive coaching inhaler device  effectiveness =    75% (delayed trigger, short Ti)    Still struggling with concepts of med reconciliation and keeping up with refills and approp use of saba and prednisone but for now The goal with a chronic steroid dependent illness is always arriving at the lowest effective dose that controls the disease/symptoms and not accepting a set "formula" which is based on statistics or guidelines that don't always take into account patient  variability or the natural hx of the dz in every individual patient, which may well vary over time.  For now therefore I recommend the patient maintain  5 mg floor and 10 mg ceiling (or double whatever the baseline is when worse symptoms or use of saba goes up as per AVS)   I had an extended discussion with the patient reviewing all relevant studies completed to date and  lasting 15 to 20 minutes of a 25 minute visit    See device teaching which extended face to face time for this visit   Each maintenance medication was reviewed in detail including most importantly the difference between maintenance and prns and under what circumstances the prns are to be triggered using an action plan format that is not reflected in the computer generated alphabetically organized AVS but trather by a customized med calendar that reflects the AVS meds with confirmed 100%  correlation.   In addition, Please see AVS for unique instructions that I personally wrote and verbalized to the the pt in detail and then reviewed with pt  by my nurse highlighting any  changes in therapy recommended at today's visit to their plan of care.      F/u with NP for med reconciliation

## 2017-09-25 ENCOUNTER — Other Ambulatory Visit: Payer: Self-pay

## 2017-09-25 ENCOUNTER — Encounter: Payer: Self-pay | Admitting: Gastroenterology

## 2017-09-25 ENCOUNTER — Ambulatory Visit (AMBULATORY_SURGERY_CENTER): Payer: Self-pay | Admitting: Gastroenterology

## 2017-09-25 ENCOUNTER — Other Ambulatory Visit (HOSPITAL_COMMUNITY)
Admission: RE | Admit: 2017-09-25 | Discharge: 2017-09-25 | Disposition: A | Discharge status: Home / Self Care | Payer: Self-pay | Source: Ambulatory Visit | Attending: Gastroenterology | Admitting: Gastroenterology

## 2017-09-25 VITALS — BP 131/69 | HR 87 | Temp 98.2°F | Resp 34 | Ht 68.0 in | Wt 158.0 lb

## 2017-09-25 DIAGNOSIS — B3781 Candidal esophagitis: Secondary | ICD-10-CM

## 2017-09-25 DIAGNOSIS — K222 Esophageal obstruction: Secondary | ICD-10-CM

## 2017-09-25 DIAGNOSIS — R1310 Dysphagia, unspecified: Secondary | ICD-10-CM | POA: Insufficient documentation

## 2017-09-25 MED ORDER — FLUCONAZOLE 100 MG PO TABS: 100.0000 mg | ORAL_TABLET | Freq: Every day | ORAL | 0 refills | Status: AC

## 2017-09-25 MED ORDER — SODIUM CHLORIDE 0.9 % IV SOLN
500.0000 mL | Freq: Once | INTRAVENOUS | Status: DC
Start: 2017-09-25 — End: 2017-11-27

## 2017-09-25 NOTE — Progress Notes (Signed)
Called to room to assist during endoscopic procedure.  Patient ID and intended procedure confirmed with present staff. Received instructions for my participation in the procedure from the performing physician.  Cytology taken over to lab at [facility] per [facility].  Specimen given to Toula Moos

## 2017-09-25 NOTE — Op Note (Signed)
[facility] Patient Name: [name] Procedure Date: 09/25/2017 1:21 PM MRN: [id number] Endoscopist: Milus Banister , MD Age: 57 Referring MD:  Date of Birth: 05-25-1960 Gender: Male Account #: [id number] Procedure:                Upper GI endoscopy Indications:              Dysphagia; Esophageal foreign body impaction,                            December 2018. Metformin pill stuck in the middle                            of his esophagus in the setting of Candida yeast                            infection of the esophagus. Likely daily, chronic                            prednisone use is responsible for                            immunosuppression, leading to yeast infection. EGD                            Dr. Ardis Hughs December 2018; crush the pill. He was                            put on Diflucan for 10 days afterwards; Recurrent                            dysphagia now. Medicines:                Monitored Anesthesia Care Procedure:                Pre-Anesthesia Assessment:                           - Prior to the procedure, a History and Physical                            was performed, and patient medications and                            allergies were reviewed. The patient's tolerance of                            previous anesthesia was also reviewed. The risks                            and benefits of the procedure and the sedation                            options and risks were discussed with the patient.  All questions were answered, and informed consent                            was obtained. Prior Anticoagulants: The patient has                            taken Plavix (clopidogrel), last dose was 5 days                            prior to procedure. ASA Grade Assessment: III - A                            patient with severe systemic disease. After                            reviewing the risks and benefits, the patient was                           deemed in satisfactory condition to undergo the                            procedure.                           After obtaining informed consent, the endoscope was                            passed under direct vision. Throughout the                            procedure, the patient's blood pressure, pulse, and                            oxygen saturations were monitored continuously. The                            Model GIF-[id number]) scope was introduced                            through the mouth, and advanced to the second part                            of duodenum. The upper GI endoscopy was                            accomplished without difficulty. The patient                            tolerated the procedure well. Scope In: Scope Out: Findings:                 Typical appearing yeast infection in the esophagus                            (yellow, whitish exudates). Given recurrence,  this                            was sampled with a brush and sent for                            culture/sensitivity.                           One benign-appearing, intrinsic moderate stenosis                            was found at the gastroesophageal junction (peptic                            appearing focal stricture, lumen 70mm). A TTS                            dilator was passed through the scope. Dilation with                            an 03-02-12 mm balloon dilator was performed to 13                            mm. There was a typical superficial mucosal tear                            and self limited bleeding following dilation.                           The exam was otherwise without abnormality. Complications:            No immediate complications. Estimated blood loss:                            None. Estimated Blood Loss:     Estimated blood loss: none. Impression:               - Recurrent (persistent?) yeast infection in the                             esophagus. This was sampled with a brush for                            culture/sensitivity. Chronic prednisone use for                            CPOD likely contributes. Will start diflucan 10 day                            course, may need to adjust based on culture                            results. May need to enlist Dr. Melvyn Novas opinion as  well.                           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 Recommendation:           - Patient has a contact number available for                            emergencies. The signs and symptoms of potential                            delayed complications were discussed with the                            patient. Return to normal activities tomorrow.                            Written discharge instructions were provided to the                            patient.                           - Usual post dilation diet instructions.                           - Continue present medications. Continue twice                            daily protonix. New prescription for diflucan 100mg                             pills, one pill daily for 10 days, no refills. You                            can resume plavix tomorrow.                           - Await culture, sensitivity testing results. Milus Banister, MD 09/25/2017 1:50:46 PM This report has been signed electronically.

## 2017-09-25 NOTE — Progress Notes (Signed)
A/ox3 pleased with MAC, report to [facility]

## 2017-09-25 NOTE — Patient Instructions (Signed)
   Diflucan 100mg  one dailt for 10 days ready for pick up at [place] (09-26-17)  FOLLOW DILATATION DIET GIVEN TO YOU TODAY    YOU HAD AN ENDOSCOPIC PROCEDURE TODAY AT [facility]:   Refer to the procedure report that was given to you for any specific questions about what was found during the examination.  If the procedure report does not answer your questions, please call your gastroenterologist to clarify.  If you requested that your care partner not be given the details of your procedure findings, then the procedure report has been included in a sealed envelope for you to review at your convenience later.  YOU SHOULD EXPECT: Some feelings of bloating in the abdomen. Passage of more gas than usual.  Walking can help get rid of the air that was put into your GI tract during the procedure and reduce the bloating. If you had a lower endoscopy (such as a colonoscopy or flexible sigmoidoscopy) you may notice spotting of blood in your stool or on the toilet paper. If you underwent a bowel prep for your procedure, you may not have a normal bowel movement for a few days.  Please Note:  You might notice some irritation and congestion in your nose or some drainage.  This is from the oxygen used during your procedure.  There is no need for concern and it should clear up in a day or so.  SYMPTOMS TO REPORT IMMEDIATELY:     Following upper endoscopy (EGD)  Vomiting of blood or coffee ground material  New chest pain or pain under the shoulder blades  Painful or persistently difficult swallowing  New shortness of breath  Fever of 100F or higher  Black, tarry-looking stools  For urgent or emergent issues, a gastroenterologist can be reached at any hour by calling [phone].   DIET:  We do recommend a small meal at first, but then you may proceed to your regular diet.  Drink plenty of fluids but you should avoid alcoholic beverages for  24 hours.  ACTIVITY:  You should plan to take it easy for the rest of today and you should NOT DRIVE or use heavy machinery until tomorrow (because of the sedation medicines used during the test).    FOLLOW UP: Our staff will call the number listed on your records the next business day following your procedure to check on you and address any questions or concerns that you may have regarding the information given to you following your procedure. If we do not reach you, we will leave a message.  However, if you are feeling well and you are not experiencing any problems, there is no need to return our call.  We will assume that you have returned to your regular daily activities without incident.  If any biopsies were taken you will be contacted by phone or by letter within the next 1-3 weeks.  Please call us at [phone] if you have not heard about the biopsies in 3 weeks.    SIGNATURES/CONFIDENTIALITY: You and/or your care partner have signed paperwork which will be entered into your electronic medical record.  These signatures attest to the fact that that the information above on your After Visit Summary has been reviewed and is understood.  Full responsibility of the confidentiality of this discharge information lies with you and/or your care-partner.

## 2017-09-25 NOTE — Progress Notes (Signed)
History reviewed today 

## 2017-10-19 ENCOUNTER — Encounter: Payer: Self-pay | Admitting: Adult Health

## 2017-10-26 ENCOUNTER — Encounter: Payer: Self-pay | Admitting: Family Medicine

## 2017-10-26 ENCOUNTER — Ambulatory Visit (INDEPENDENT_AMBULATORY_CARE_PROVIDER_SITE_OTHER): Payer: Self-pay | Admitting: Family Medicine

## 2017-10-26 VITALS — BP 120/80 | HR 94 | Temp 98.7°F | Wt 161.4 lb

## 2017-10-26 DIAGNOSIS — K219 Gastro-esophageal reflux disease without esophagitis: Secondary | ICD-10-CM | POA: Insufficient documentation

## 2017-10-26 DIAGNOSIS — I10 Essential (primary) hypertension: Secondary | ICD-10-CM

## 2017-10-26 DIAGNOSIS — J449 Chronic obstructive pulmonary disease, unspecified: Secondary | ICD-10-CM

## 2017-10-26 MED ORDER — CLOPIDOGREL BISULFATE 75 MG PO TABS: 75.0000 mg | ORAL_TABLET | ORAL | 3 refills | Status: DC

## 2017-10-26 MED ORDER — PREDNISONE 5 MG PO TABS: 5.0000 mg | ORAL_TABLET | Freq: Every day | ORAL | 3 refills | Status: DC

## 2017-10-26 MED ORDER — ATORVASTATIN CALCIUM 40 MG PO TABS: 40.0000 mg | ORAL_TABLET | Freq: Every day | ORAL | 3 refills | Status: DC

## 2017-10-26 MED ORDER — LOSARTAN POTASSIUM 50 MG PO TABS: 50.0000 mg | ORAL_TABLET | Freq: Every day | ORAL | 3 refills | Status: DC

## 2017-10-26 MED ORDER — PANTOPRAZOLE SODIUM 40 MG PO TBEC: DELAYED_RELEASE_TABLET | ORAL | 3 refills | Status: DC

## 2017-10-26 NOTE — Progress Notes (Signed)
Subjective   Patient ID: [name]    DOB: 01/18/1961, 57 y.o. male   MRN: [id number]  CC: "Medication refills"  HPI: [name] is a 57 y.o. male who presents to clinic today for the following:  COPD: Patient recently seen by pulmonology and was switched to [facility].  Patient also taking albuterol nebulizer at home with good control of symptoms.  Patient is without albuterol rescue inhaler.  He was also recommended he continue prednisone 5 mg daily with plans to increase to 10 mg daily as needed if symptoms are not well controlled.  Patient states he has been without prednisone for the last 2 weeks.  Patient's lack of medication have been due to denial from the medication assistance program through the health department.  Patient is unsure why he was denied these medications.  He denies fevers or chills, cough, chest pain, sputum production, shortness of breath.  GERD: Patient taking Protonix daily but has been out of medication for approximately 2 weeks.  He was seen by GI recently and given 10 days of Diflucan for esophageal candidiasis thought to be related to long-term steroid use.  Hypertension: Patient is on losartan daily.  He has run out of medication for the last 2 weeks.  He is a never smoker.  ROS: see HPI for pertinent.  [place]: Uncontrolled T2DM, HTN, h/o MI, CAD with ICM, COPD with asthma, cataracts, dental caries. Smoking status reviewed.  Surgical history stent.  Family history DM.  Medications reviewed.  Objective   BP 120/80   Pulse 94   Temp 98.7 F (37.1 C) (Oral)   Wt 161 lb 6.4 oz (73.2 kg)   SpO2 97%   BMI 24.54 kg/m  Vitals and nursing note reviewed.  General: well nourished, well developed, NAD with non-toxic appearance HEENT: normocephalic, atraumatic, moist mucous membranes Neck: supple, non-tender without lymphadenopathy Cardiovascular: regular rate and rhythm without murmurs, rubs, or gallops Lungs: clear to auscultation bilaterally with normal  work of breathing Abdomen: soft, non-tender, non-distended, normoactive bowel sounds Skin: warm, dry, no rashes or lesions, cap refill < 2 seconds Extremities: warm and well perfused, normal tone, no edema  Assessment & Plan   Primary hypertension Chronic.  Controlled.  Tolerating losartan.  Never smoker. - Given refill for losartan and Lipitor 40 mg daily  GERD (gastroesophageal reflux disease) Chronic.  Controlled.  Long-term use of PPI.  Does have known esophageal candidiasis thought to be related to steroid use. - Given refill for Protonix 40 mg daily  COPD with asthma ([place]) Chronic.  Controlled.  Currently on long-term prednisone 5-10 mg.  Non-smoker.  Followed by pulmonology. - Continue Dulera and albuterol nebulizer - Given refill for albuterol rescue inhaler - Given refill for prednisone 5-10 mg daily - Patient to contact if issues with MAP and plans to renew after 12/01/2017  No orders of the defined types were placed in this encounter.  Meds ordered this encounter  Medications  . clopidogrel (PLAVIX) 75 MG tablet    Sig: Take 1 tablet (75 mg total) by mouth every morning.    Dispense:  90 tablet    Refill:  3    Refills currently on file  . losartan (COZAAR) 50 MG tablet    Sig: Take 1 tablet (50 mg total) by mouth daily.    Dispense:  90 tablet    Refill:  3  . predniSONE (DELTASONE) 5 MG tablet    Sig: Take 1-2 tablets (5-10 mg total) by mouth daily with  breakfast.    Dispense:  90 tablet    Refill:  3  . pantoprazole (PROTONIX) 40 MG tablet    Sig: Take 30- 60 min before your first and last meals of the day    Dispense:  90 tablet    Refill:  3  . atorvastatin (LIPITOR) 40 MG tablet    Sig: Take 1 tablet (40 mg total) by mouth at bedtime.    Dispense:  90 tablet    Refill:  [place], [place], PGY-2 10/26/2017, 3:47 PM

## 2017-10-26 NOTE — Assessment & Plan Note (Signed)
Chronic.  Controlled.  Tolerating losartan.  Never smoker. - Given refill for losartan and Lipitor 40 mg daily

## 2017-10-26 NOTE — Assessment & Plan Note (Signed)
Chronic.  Controlled.  Long-term use of PPI.  Does have known esophageal candidiasis thought to be related to steroid use. - Given refill for Protonix 40 mg daily

## 2017-10-26 NOTE — Patient Instructions (Signed)
Thank you for coming in to see [place] today. Please see below to review our plan for today's visit.  It is important that you take your prednisone daily and her Plavix daily.  Call us next time if you have difficulty obtaining medication.  We will look into why you are declined for medication refills at the health department.  Please call the clinic at [phone] if your symptoms worsen or you have any concerns. It was our pleasure to serve you.  [place], [place], PGY-3

## 2017-10-26 NOTE — Assessment & Plan Note (Signed)
Chronic.  Controlled.  Currently on long-term prednisone 5-10 mg.  Non-smoker.  Followed by pulmonology. - Continue Dulera and albuterol nebulizer - Given refill for albuterol rescue inhaler - Given refill for prednisone 5-10 mg daily - Patient to contact if issues with MAP and plans to renew after 12/01/2017

## 2017-11-02 ENCOUNTER — Encounter: Payer: Self-pay | Admitting: Adult Health

## 2017-11-02 ENCOUNTER — Ambulatory Visit (INDEPENDENT_AMBULATORY_CARE_PROVIDER_SITE_OTHER): Payer: Self-pay | Admitting: Adult Health

## 2017-11-02 DIAGNOSIS — J329 Chronic sinusitis, unspecified: Secondary | ICD-10-CM

## 2017-11-02 DIAGNOSIS — J449 Chronic obstructive pulmonary disease, unspecified: Secondary | ICD-10-CM

## 2017-11-02 NOTE — Addendum Note (Signed)
Addended by: Desmond Dike C on: 11/02/2017 03:28 PM   Modules accepted: Orders

## 2017-11-02 NOTE — Progress Notes (Signed)
_0  ID: [name], male    DOB: 09-02-1960, 57 y.o.   MRN: [id number]  Chief Complaint  Patient presents with  . Follow-up    Asthma     Referring provider:  Bing, DO  HPI: 57 year old male former smoker, quit 2008, followed for severe atopic asthma, chronic sinusitis, elevated IgE (previous Xolair fail) on chronic steroids since 2005.  CAD s/p MI w/ Stent to LAD 2008  TEST  PFT's 2007 FEV1 1.78 (50%) ratio 52 and 19% resp to saba, dlco 75% - spirometry 12/18/2014 >FEV1 1.04 (34%) ratio 53 >trial of dulera 200 2bid and prednisone 5 mg daily  - FENO 12/21/2015 = 9 p am symb 160 rx - Spirometry 12/21/2015 FEV1 1.33 (47%) Ratio 53 p am symb 160/spiriva  - Spirometry 06/12/2017  FEV1 1.43 (49%)  Ratio 51 p dulera and off spiriva xone week so leave off spriva   11/02/2017 Follow up : Asthma /Med Review  Patient presents for a 6 week  follow-up.  Says overall that his breathing is doing better. Has decreased cough and wheezing . Uses albuterol neb once daily , feels this helps him through the day. Has not been able to afford albuterol inhaler , the co pay very expensive. He has a coupon to see if this will be cheaper. He does not have insurance .  He remains on Dulera 200 2 puffs twice daily.  He is on chronic steroids with prednisone at 65m  Daily. Last office visit with more wheezing and dyspnea, increased prednisone 212mdaily . Says this really helped , feels better.   We reviewed all his medications and organize them into a medication count with patient education.  He appears to be taking his medications correctly. He gets Dulera and Nasonex thru patient assitance program  Gets rest of meds thru health dept.    No Known Allergies  Immunization History  Administered Date(s) Administered  . Hepatitis A, Adult 03/21/2015  . Hepatitis B, adult 03/21/2015  . Influenza Split 03/21/2011, 12/21/2011, 01/19/2013, 01/20/2015  . Influenza Whole  01/20/2008, 01/30/2009  . Influenza,inj,Quad PF,6+ Mos 12/21/2015, 05/06/2016  . MMR 03/21/2015  . Meningococcal Mcv4o 05/06/2016  . Pneumococcal Polysaccharide-23 01/30/2009, 08/14/2017  . Td 05/27/2004  . Tdap 02/27/2015    Past Medical History:  Diagnosis Date  . Asthma   . Asthma   . CAD (coronary artery disease)   . Cataract 2018   both eyes  . Cellulitis of leg, right 10/2014  . Dental caries   . Diabetes mellitus    TYPE 2  . GERD (gastroesophageal reflux disease)   . Hyperlipidemia   . Hypertension 2017  . Myocardial infarction ([facility]2008  . Thyroid nodule    biopsy negative 2006    Tobacco History: Social History   Tobacco Use  Smoking Status Former Smoker  . Packs/day: 0.50  . Years: 22.00  . Pack years: 11.00  . Types: Cigarettes  . Last attempt to quit: 04/21/2006  . Years since quitting: 11.5  Smokeless Tobacco Never Used  Tobacco Comment   quit 10 years ago   Counseling given: Not Answered Comment: quit 10 years ago   Outpatient Medications Prior to Visit  Medication Sig Dispense Refill  . albuterol (PROVENTIL HFA;VENTOLIN HFA) 108 (90 Base) MCG/ACT inhaler Inhale 2 puffs into the lungs every 4 (four) hours as needed for wheezing or shortness of breath (((PLAN B))). 3 Inhaler 0  . albuterol (PROVENTIL) (2.5 MG/3ML) 0.083% nebulizer solution Take 3 mLs (  2.5 mg total) by nebulization every 6 (six) hours as needed for wheezing or shortness of breath. Dx J45.909 75 mL 5  . aspirin (ADULT ASPIRIN EC LOW STRENGTH) 81 MG EC tablet Take 81 mg by mouth every morning.     Marland Kitchen atorvastatin (LIPITOR) 40 MG tablet Take 1 tablet (40 mg total) by mouth at bedtime. 90 tablet 3  . calcium-vitamin D (OSCAL WITH D) 250-125 MG-UNIT tablet Take 1 tablet by mouth 2 (two) times daily.    . clopidogrel (PLAVIX) 75 MG tablet Take 1 tablet (75 mg total) by mouth every morning. 90 tablet 3  . losartan (COZAAR) 50 MG tablet Take 1 tablet (50 mg total) by mouth daily. 90 tablet 3    . metFORMIN (GLUCOPHAGE) 1000 MG tablet Take 1 tablet (1,000 mg total) by mouth 2 (two) times daily with a meal. 180 tablet 3  . mometasone-formoterol (DULERA) 200-5 MCG/ACT AERO Inhale 2 puffs into the lungs 2 (two) times daily. 1 Inhaler 5  . Multiple Vitamin (MULTIVITAMIN) tablet Take 1 tablet by mouth every morning.     . pantoprazole (PROTONIX) 40 MG tablet Take 30- 60 min before your first and last meals of the day 90 tablet 3  . predniSONE (DELTASONE) 5 MG tablet Take 1-2 tablets (5-10 mg total) by mouth daily with breakfast. 90 tablet 3   Facility-Administered Medications Prior to Visit  Medication Dose Route Frequency Provider Last Rate Last Dose  . 0.9 %  sodium chloride infusion  500 mL Intravenous Once Milus Banister, MD         Review of Systems  Constitutional:   No  weight loss, night sweats,  Fevers, chills,  +fatigue, or  lassitude.  HEENT:   No headaches,  Difficulty swallowing,  Tooth/dental problems, or  Sore throat,                No sneezing, itching, ear ache,  +nasal congestion, post nasal drip,   CV:  No chest pain,  Orthopnea, PND, swelling in lower extremities, anasarca, dizziness, palpitations, syncope.   GI  No heartburn, indigestion, abdominal pain, nausea, vomiting, diarrhea, change in bowel habits, loss of appetite, bloody stools.   Resp:  No chest wall deformity  Skin: no rash or lesions.  GU: no dysuria, change in color of urine, no urgency or frequency.  No flank pain, no hematuria   MS:  No joint pain or swelling.  No decreased range of motion.  No back pain.    Physical Exam  BP 120/78 (BP Location: Left Arm, Cuff Size: Normal)   Pulse 89   Ht _0  (1.727 m)   Wt 160 lb 3.2 oz (72.7 kg)   SpO2 97%   BMI 24.36 kg/m   GEN: A/Ox3; pleasant , NAD, well nourished    HEENT:  [place]/AT,  EACs-clear, TMs-wnl, NOSE-clear, THROAT-clear, no lesions, no postnasal drip or exudate noted.   NECK:  Supple w/ fair ROM; no JVD; normal carotid  impulses w/o bruits; no thyromegaly or nodules palpated; no lymphadenopathy.    RESP  Clear  P & A; w/o, wheezes/ rales/ or rhonchi. no accessory muscle use, no dullness to percussion  CARD:  RRR, no m/r/g, no peripheral edema, pulses intact, no cyanosis or clubbing.  GI:   Soft & nt; nml bowel sounds; no organomegaly or masses detected.   Musco: Warm bil, no deformities or joint swelling noted.   Neuro: alert, no focal deficits noted.    Skin: Warm, no  lesions or rashes    Lab Results:  CBC  BNP No results found for: BNP  ProBNP  Imaging: No results found.   Assessment & Plan:   COPD with asthma ([place]) Recent flare now improved , will try to decrease steroids back to baseline  Patient's medications were reviewed today and patient education was given. Computerized medication calendar was adjusted/completed   Plan  Patient Instructions  Continue on Dulera 2 puffs Twice daily  , rinse after use.  Decrease Prednisone 66m daily .  Follow med calendar closely and bring to each visit.  Follow up Dr. WMelvyn Novasin 2-3 months and As needed         Sinusitis, chronic Trigger prevention  Cont on current regimen .      TRexene Edison NP 11/02/2017

## 2017-11-02 NOTE — Patient Instructions (Signed)
Continue on Dulera 2 puffs Twice daily  , rinse after use.  Decrease Prednisone 10mg  daily .  Follow med calendar closely and bring to each visit.  Follow up Dr. Melvyn Novas in 2-3 months and As needed

## 2017-11-02 NOTE — Progress Notes (Signed)
Chart and office note reviewed in detail  > agree with a/p as outlined    

## 2017-11-02 NOTE — Assessment & Plan Note (Signed)
Recent flare now improved , will try to decrease steroids back to baseline  Patient's medications were reviewed today and patient education was given. Computerized medication calendar was adjusted/completed   Plan  Patient Instructions  Continue on Dulera 2 puffs Twice daily  , rinse after use.  Decrease Prednisone 10mg  daily .  Follow med calendar closely and bring to each visit.  Follow up Dr. Melvyn Novas in 2-3 months and As needed

## 2017-11-02 NOTE — Assessment & Plan Note (Signed)
Trigger prevention  Cont on current regimen .

## 2017-11-06 ENCOUNTER — Telehealth: Payer: Self-pay | Admitting: Internal Medicine

## 2017-11-06 NOTE — Telephone Encounter (Signed)
Called patient unable to reach left message to give us a call back.

## 2017-11-09 MED ORDER — MOMETASONE FURO-FORMOTEROL FUM 100-5 MCG/ACT IN AERO: 2.0000 | INHALATION_SPRAY | Freq: Two times a day (BID) | RESPIRATORY_TRACT | 0 refills | Status: DC

## 2017-11-09 NOTE — Telephone Encounter (Signed)
Spoke with [name]. [name] states he has not gotten his Dulera 200 [name] assistance shipment. I called [employer] at [phone] and was advised [name] just needs to call to give the 'okay' for another shipment. I called [name] to advised him of this, [name] verbalized understanding. [name] states he has run out of his current supply of Dulera. We do not have any Dulera 200 but we do have Dulera 100. I spoke with Dr. Melvyn Novas and he advised [name] could use the [facility] 100 until he receives his shipment of Dulera 200. [name] aware this is up front for pick up. Nothing further needed.

## 2017-11-27 ENCOUNTER — Encounter: Payer: Self-pay | Admitting: Family Medicine

## 2017-11-27 ENCOUNTER — Ambulatory Visit (INDEPENDENT_AMBULATORY_CARE_PROVIDER_SITE_OTHER): Payer: Self-pay | Admitting: Family Medicine

## 2017-11-27 ENCOUNTER — Other Ambulatory Visit: Payer: Self-pay

## 2017-11-27 VITALS — BP 122/72 | HR 82 | Temp 98.4°F | Wt 157.0 lb

## 2017-11-27 DIAGNOSIS — IMO0002 Reserved for concepts with insufficient information to code with codable children: Secondary | ICD-10-CM

## 2017-11-27 DIAGNOSIS — E1165 Type 2 diabetes mellitus with hyperglycemia: Secondary | ICD-10-CM

## 2017-11-27 DIAGNOSIS — H259 Unspecified age-related cataract: Secondary | ICD-10-CM

## 2017-11-27 LAB — POCT GLYCOSYLATED HEMOGLOBIN (HGB A1C): HbA1c, POC (controlled diabetic range): 7.2 % — AB (ref 0.0–7.0)

## 2017-11-27 MED ORDER — ATORVASTATIN CALCIUM 80 MG PO TABS: 40.0000 mg | ORAL_TABLET | Freq: Every day | ORAL | 3 refills | Status: DC

## 2017-11-27 MED ORDER — METFORMIN HCL 1000 MG PO TABS: 1000.0000 mg | ORAL_TABLET | Freq: Two times a day (BID) | ORAL | 3 refills | Status: DC

## 2017-11-27 NOTE — Assessment & Plan Note (Signed)
Advised of need to schedule with eye doctor and pay out of pocket given no eye docs currently take the orange card. Patient will call around for best pricing.

## 2017-11-27 NOTE — Progress Notes (Signed)
Date of Visit: 11/27/2017   HPI:  Patient presents to discuss vision issues and medication refill.  Diabetes - currently taking metformin 1000mg  twice daily. Tolerating well. Fasting sugars run 130s-140s. Needs metformin refilled. Due for A1c today. Also due for foot exam. Denies chest pain or shortness of breath. Also needs atorvastatin refilled.  Vision issues - has had problems with his vision for 1 year. Last eye exam was 3 years ago at [facility]. Has been told he has cataracts. Vision is blurry at night. Not worsening, just the same. Has been told he needs surgery to fix cataracts. Was referred to ophthalmology but has orange card and there are no eye doctors who take the orange card right now.  ROS: See HPI.  [place]: history of chronic sinusitis, COPD/asthma, hypertension, type 2 diabetes, prior MI, CAD, ischemic cardiomyopathy, GERD  PHYSICAL EXAM: BP 122/72   Pulse 82   Temp 98.4 F (36.9 C) (Oral)   Wt 157 lb (71.2 kg)   SpO2 96%   BMI 23.87 kg/m  Gen: no acute distress, pleasant, cooperative HEENT: normocephalic, atraumatic, moist mucous membranes, pupils equal round and reactive to light. Heart: regular rate and rhythm, no murmur Lungs: clear to auscultation bilaterally, normal work of breathing  Neuro: alert, grossly nonfocal, speech normal Diabetic foot exam: 2+ DP pulses bilat, normal monofilament testing bilaterally. No lesions or significant calluses.   Corrected visual acuity: L 20/40  R 20/200  B 20/20   ASSESSMENT/PLAN:  Health maintenance:  -diabetic foot exam done today  Cataracts, bilateral Advised of need to schedule with eye doctor and pay out of pocket given no eye docs currently take the orange card. Patient will call around for best pricing.  Uncontrolled type 2 diabetes mellitus, without long-term current use of insulin (HCC) A1c at goal today at 7.2. Refill metformin.  Cardiac: on statin, aspirin. Refill atorv today. Renal: on ARB Eye: patient  to schedule eye appointment  Foot: exam done today, normal Immunizations: UTD   FOLLOW UP: Follow up in 3 months with PCP for diabetes  Schedule with eye doctor  [place] J. Ardelia Mems, [place]

## 2017-11-27 NOTE — Patient Instructions (Signed)
Refilled medications today  Call around for eye doctor pricing - unfortunately you'll need to pay out of pocket.  A1c looks good today at 7.2.  Follow up with Dr. Yisroel Ramming in 3 months, sooner if needed.  Be well, Dr. Ardelia Mems

## 2017-11-27 NOTE — Assessment & Plan Note (Addendum)
A1c at goal today at 7.2. Refill metformin.  Cardiac: on statin, aspirin. Refill atorv today. Renal: on ARB Eye: patient to schedule eye appointment  Foot: exam done today, normal Immunizations: UTD

## 2017-12-30 ENCOUNTER — Ambulatory Visit: Payer: Self-pay

## 2018-01-04 ENCOUNTER — Ambulatory Visit: Payer: Self-pay | Admitting: Internal Medicine

## 2018-01-05 ENCOUNTER — Ambulatory Visit (INDEPENDENT_AMBULATORY_CARE_PROVIDER_SITE_OTHER): Payer: Self-pay | Admitting: Internal Medicine

## 2018-01-05 ENCOUNTER — Encounter: Payer: Self-pay | Admitting: Internal Medicine

## 2018-01-05 VITALS — BP 120/80 | HR 87 | Ht 67.25 in | Wt 158.0 lb

## 2018-01-05 DIAGNOSIS — J449 Chronic obstructive pulmonary disease, unspecified: Secondary | ICD-10-CM

## 2018-01-05 DIAGNOSIS — Z23 Encounter for immunization: Secondary | ICD-10-CM

## 2018-01-05 MED ORDER — ALBUTEROL SULFATE HFA 108 (90 BASE) MCG/ACT IN AERS: 2.0000 | INHALATION_SPRAY | RESPIRATORY_TRACT | 0 refills | Status: DC | PRN

## 2018-01-05 MED ORDER — MOMETASONE FURO-FORMOTEROL FUM 200-5 MCG/ACT IN AERO: 2.0000 | INHALATION_SPRAY | Freq: Two times a day (BID) | RESPIRATORY_TRACT | 0 refills | Status: DC

## 2018-01-05 NOTE — Addendum Note (Signed)
Addended by: Elton Sin on: 01/05/2018 09:28 AM   Modules accepted: Orders
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
## 2018-01-05 NOTE — Patient Instructions (Signed)
Work on Doctor, hospital technique:  relax and gently blow all the way out then take a nice smooth deep breath back in, triggering the inhaler at same time you start breathing in.  Hold for up to 5 seconds if you can. Blow out thru nose. Rinse and gargle with water when done   See calendar for specific medication instructions and bring it back for each and every office visit for every healthcare provider you see.  Without it,  you may not receive the best quality medical care that we feel you deserve.  You will note that the calendar groups together  your maintenance  medications that are timed at particular times of the day.  Think of this as your checklist for what your doctor has instructed you to do until your next evaluation to see what benefit  there is  to staying on a consistent group of medications intended to keep you well.  The other group at the bottom is entirely up to you to use as you see fit  for specific symptoms that may arise between visits that require you to treat them on an as needed basis.  Think of this as your action plan or "what if" list.   Separating the top medications from the bottom group is fundamental to providing you adequate care going forward.     Please schedule a follow up visit in 6  months but call sooner if needed

## 2018-01-05 NOTE — Assessment & Plan Note (Addendum)
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 2008  - PFT's 2007  FEV1  1.78 (50%) ratio 52 and 19% resp to saba, dlco 75% - d/c coreg 09/03/2013  - spirometry 12/18/2014 >  FEV1 1.04 (34%) ratio 53     > trial of dulera 200 2bid and prednisone 5 mg daily   - Spiriva respimat added 05/01/2015  - max gerd rx1/01/2016 > improved 06/12/2015   - FENO 12/21/2015  =   9  p am symb 160 rx - Spirometry 12/21/2015  FEV1 1.33 (47%)  Ratio 53 p am symb 160/spiriva  - Spirometry 06/12/2017  FEV1 1.43 (49%)  Ratio 51 p dulera and off spiriva xone week so leave off spriva -  01/05/2018  After extensive coaching inhaler device,  effectiveness =    90%   Still overusing saba but not as badly as previously   The goal with a chronic steroid dependent illness is always arriving at the lowest effective dose that controls the disease/symptoms and not accepting a set "formula" which is based on statistics or guidelines that don't always take into account patient  variability or the natural hx of the dz in every individual patient, which may well vary over time.  For now therefore I recommend the patient maintain  Ceiling of 20 mg and floor of 10 mg per day as per med calendar    I had an extended discussion with the patient reviewing all relevant studies completed to date and  lasting 15 to 20 minutes of a 25 minute visit    Each maintenance medication was reviewed in detail including most importantly the difference between maintenance and prns and under what circumstances the prns are to be triggered using an action plan format that is not reflected in the computer generated alphabetically organized AVS but trather by a customized med calendar that reflects the AVS meds with confirmed 100% correlation.   In addition, Please see AVS for unique instructions that I personally wrote and verbalized to the the pt in detail and then reviewed with pt  by my nurse highlighting any  changes in therapy recommended at today's visit  to their plan of care.    See device teaching which extended face to face time for this visit     F/u can be q 6 m, call sooner prn

## 2018-01-07 ENCOUNTER — Telehealth: Payer: Self-pay | Admitting: Internal Medicine

## 2018-01-07 NOTE — Telephone Encounter (Signed)
LMTCB

## 2018-01-08 NOTE — Telephone Encounter (Signed)
LMTCB. LMTCB with patient as well.

## 2018-01-08 NOTE — Telephone Encounter (Signed)
Dawn from [place] returning call [phone]

## 2018-01-08 NOTE — Telephone Encounter (Signed)
Spoke with [employer] at [place]. States patient wanted to get albuterol HFA through them; however Dawn explained to the patient that he should be able to get Rx for free through [employer] as he gets [place] and another medication through them. Pt told Dawn our office told him that he would have to purchase inhaler out of pocket. Explained to [facility] that our office has no documentation of that noted and we always help our patients with medications/patient assistance.  I have left a message for patient to call us about this and have him contact [employer] to add this medication on; will need to fax Rx over.

## 2018-01-11 NOTE — Telephone Encounter (Signed)
LMOM TCB x2

## 2018-01-12 NOTE — Telephone Encounter (Signed)
Spoke with patient he is aware of these recs and will call [employer] nothing further needed at this time.

## 2018-03-31 ENCOUNTER — Encounter (HOSPITAL_COMMUNITY)
Admission: EM | Disposition: A | Discharge status: Home / Self Care | Payer: Self-pay | Source: Home / Self Care | Attending: Emergency Medicine

## 2018-03-31 ENCOUNTER — Ambulatory Visit (HOSPITAL_COMMUNITY)
Admission: EM | Admit: 2018-03-31 | Discharge: 2018-03-31 | Disposition: A | Discharge status: Home / Self Care | Payer: Self-pay | Attending: Gastroenterology | Admitting: Gastroenterology

## 2018-03-31 ENCOUNTER — Ambulatory Visit (HOSPITAL_COMMUNITY)
Admission: EM | Admit: 2018-03-31 | Discharge: 2018-03-31 | Disposition: A | Discharge status: Home / Self Care | Payer: Self-pay

## 2018-03-31 ENCOUNTER — Telehealth: Payer: Self-pay | Admitting: Gastroenterology

## 2018-03-31 ENCOUNTER — Encounter (HOSPITAL_COMMUNITY): Payer: Self-pay | Admitting: *Deleted

## 2018-03-31 DIAGNOSIS — Z87891 Personal history of nicotine dependence: Secondary | ICD-10-CM | POA: Insufficient documentation

## 2018-03-31 DIAGNOSIS — Z7982 Long term (current) use of aspirin: Secondary | ICD-10-CM | POA: Insufficient documentation

## 2018-03-31 DIAGNOSIS — J329 Chronic sinusitis, unspecified: Secondary | ICD-10-CM | POA: Insufficient documentation

## 2018-03-31 DIAGNOSIS — Z955 Presence of coronary angioplasty implant and graft: Secondary | ICD-10-CM | POA: Insufficient documentation

## 2018-03-31 DIAGNOSIS — Z7902 Long term (current) use of antithrombotics/antiplatelets: Secondary | ICD-10-CM | POA: Insufficient documentation

## 2018-03-31 DIAGNOSIS — E1136 Type 2 diabetes mellitus with diabetic cataract: Secondary | ICD-10-CM | POA: Insufficient documentation

## 2018-03-31 DIAGNOSIS — J449 Chronic obstructive pulmonary disease, unspecified: Secondary | ICD-10-CM | POA: Insufficient documentation

## 2018-03-31 DIAGNOSIS — Z7952 Long term (current) use of systemic steroids: Secondary | ICD-10-CM | POA: Insufficient documentation

## 2018-03-31 DIAGNOSIS — W44F3XA Food entering into or through a natural orifice, initial encounter: Secondary | ICD-10-CM | POA: Insufficient documentation

## 2018-03-31 DIAGNOSIS — Z8249 Family history of ischemic heart disease and other diseases of the circulatory system: Secondary | ICD-10-CM | POA: Insufficient documentation

## 2018-03-31 DIAGNOSIS — I10 Essential (primary) hypertension: Secondary | ICD-10-CM | POA: Insufficient documentation

## 2018-03-31 DIAGNOSIS — I2510 Atherosclerotic heart disease of native coronary artery without angina pectoris: Secondary | ICD-10-CM | POA: Insufficient documentation

## 2018-03-31 DIAGNOSIS — T18128A Food in esophagus causing other injury, initial encounter: Secondary | ICD-10-CM | POA: Insufficient documentation

## 2018-03-31 DIAGNOSIS — Z7984 Long term (current) use of oral hypoglycemic drugs: Secondary | ICD-10-CM | POA: Insufficient documentation

## 2018-03-31 DIAGNOSIS — X58XXXA Exposure to other specified factors, initial encounter: Secondary | ICD-10-CM | POA: Insufficient documentation

## 2018-03-31 DIAGNOSIS — I252 Old myocardial infarction: Secondary | ICD-10-CM | POA: Insufficient documentation

## 2018-03-31 DIAGNOSIS — H269 Unspecified cataract: Secondary | ICD-10-CM | POA: Insufficient documentation

## 2018-03-31 DIAGNOSIS — Z79899 Other long term (current) drug therapy: Secondary | ICD-10-CM | POA: Insufficient documentation

## 2018-03-31 DIAGNOSIS — I255 Ischemic cardiomyopathy: Secondary | ICD-10-CM | POA: Insufficient documentation

## 2018-03-31 DIAGNOSIS — K222 Esophageal obstruction: Secondary | ICD-10-CM | POA: Insufficient documentation

## 2018-03-31 DIAGNOSIS — K210 Gastro-esophageal reflux disease with esophagitis: Secondary | ICD-10-CM | POA: Insufficient documentation

## 2018-03-31 HISTORY — PX: FOREIGN BODY REMOVAL: SHX962

## 2018-03-31 HISTORY — PX: ESOPHAGOGASTRODUODENOSCOPY: SHX5428

## 2018-03-31 SURGERY — EGD (ESOPHAGOGASTRODUODENOSCOPY)
Anesthesia: Moderate Sedation

## 2018-03-31 MED ORDER — LIDOCAINE VISCOUS HCL 2 % MT SOLN
OROMUCOSAL | Status: AC
  Filled 2018-03-31: qty 15

## 2018-03-31 MED ORDER — FENTANYL CITRATE (PF) 100 MCG/2ML IJ SOLN
INTRAMUSCULAR | Status: DC | PRN
  Administered 2018-03-31: 25 ug via INTRAVENOUS

## 2018-03-31 MED ORDER — SODIUM CHLORIDE 0.9 % IV SOLN
INTRAVENOUS | Status: DC
  Administered 2018-03-31: 17:00:00 via INTRAVENOUS

## 2018-03-31 MED ORDER — FENTANYL CITRATE (PF) 100 MCG/2ML IJ SOLN
INTRAMUSCULAR | Status: AC
  Filled 2018-03-31: qty 4

## 2018-03-31 MED ORDER — LIDOCAINE VISCOUS HCL 2 % MT SOLN
OROMUCOSAL | Status: DC | PRN
  Administered 2018-03-31: 5 mL via OROMUCOSAL

## 2018-03-31 MED ORDER — GLUCAGON HCL RDNA (DIAGNOSTIC) 1 MG IJ SOLR: 1.0000 mg | Freq: Once | INTRAMUSCULAR | Status: DC

## 2018-03-31 MED ORDER — DIPHENHYDRAMINE HCL 50 MG/ML IJ SOLN
INTRAMUSCULAR | Status: AC
  Filled 2018-03-31: qty 1

## 2018-03-31 MED ORDER — MIDAZOLAM HCL (PF) 5 MG/ML IJ SOLN
INTRAMUSCULAR | Status: AC
  Filled 2018-03-31: qty 3

## 2018-03-31 MED ORDER — MIDAZOLAM HCL (PF) 10 MG/2ML IJ SOLN
INTRAMUSCULAR | Status: DC | PRN
  Administered 2018-03-31: 2 mg via INTRAVENOUS
  Administered 2018-03-31: 1 mg via INTRAVENOUS

## 2018-03-31 NOTE — ED Provider Notes (Signed)
[facility] EMERGENCY DEPARTMENT Provider Note   CSN: [id number] Arrival date & time: 03/31/18  1406     History   Chief Complaint Chief Complaint  Patient presents with  . Foreign Body    HPI [name] is a 57 y.o. male.   Foreign Body  The current episode started 6 to 12 hours ago. The foreign body is suspected to be swallowed. The foreign body is food. Causes for concern: has occured before, has stricture of esophagus. Associated symptoms include trouble swallowing. Pertinent negatives include no fever, no congestion, no drainage, no nosebleeds, no drooling, no sore throat, no choking, no cough, no wheezing, no chest pain, no vomiting, no abdominal pain, no vaginal discharge, no anal discharge, no vaginal bleeding, no anal bleeding, no vaginal pain and no rectal pain. Associated medical issues include prior foreign body removal and esophageal disease.    Past Medical History:  Diagnosis Date  . Asthma   . CAD (coronary artery disease)   . Cataract 2018   both eyes  . Cellulitis of leg, right 10/2014  . Dental caries   . Diabetes mellitus    TYPE 2  . GERD (gastroesophageal reflux disease)   . Hyperlipidemia   . Hypertension 2017  . Myocardial infarction ([place]) 2008  . Thyroid nodule    biopsy negative 2006    Patient Active Problem List   Diagnosis Date Noted  . Esophageal obstruction due to food impaction   . GERD (gastroesophageal reflux disease) 10/26/2017  . Primary hypertension 04/22/2017  . Leg edema   . COPD with asthma ([place]) 12/18/2014  . Cataracts, bilateral 06/27/2014  . Cardiomyopathy, ischemic 11/11/2011  . Dental caries 08/06/2010  . Uncontrolled type 2 diabetes mellitus, without long-term current use of insulin ([place]) 06/12/2009  . Atherosclerosis of native coronary artery of native heart 07/13/2008  . History of MI (myocardial infarction) 04/01/2007  . Sinusitis, chronic 06/18/2006    Past Surgical History:  Procedure  Laterality Date  . CORONARY STENT PLACEMENT    . ESOPHAGOGASTRODUODENOSCOPY N/A 03/29/2017   Procedure: ESOPHAGOGASTRODUODENOSCOPY (EGD);  Surgeon: Milus Banister, MD;  Location: [facility] ENDOSCOPY;  Service: Endoscopy;  Laterality: N/A;  . solitary pulm and thryoid nodules          Home Medications    Prior to Admission medications   Medication Sig Start Date End Date Taking? Authorizing Provider  calcium-vitamin D (OSCAL WITH D) 250-125 MG-UNIT tablet Take 1 tablet by mouth 2 (two) times daily.   Yes [provider]  clopidogrel (PLAVIX) 75 MG tablet Take 1 tablet (75 mg total) by mouth every morning. 10/26/17  Yes Zenda Bing, DO  metFORMIN (GLUCOPHAGE) 1000 MG tablet Take 1 tablet (1,000 mg total) by mouth 2 (two) times daily with a meal. 11/27/17  Yes Leeanne Rio, MD  predniSONE (DELTASONE) 10 MG tablet Take 10 mg by mouth daily with breakfast.   Yes [provider]  albuterol (PROVENTIL HFA;VENTOLIN HFA) 108 (90 Base) MCG/ACT inhaler Inhale 2 puffs into the lungs every 4 (four) hours as needed for wheezing or shortness of breath (((PLAN B))). Patient not taking: Reported on 03/31/2018 01/05/18   Tanda Rockers, MD  atorvastatin (LIPITOR) 80 MG tablet Take 0.5 tablets (40 mg total) by mouth daily. Patient not taking: Reported on 03/31/2018 11/27/17   Leeanne Rio, MD  mometasone-formoterol [facility]) 100-5 MCG/ACT AERO Inhale 2 puffs into the lungs 2 (two) times daily. Patient not taking: Reported on 03/31/2018 11/09/17  Tanda Rockers, MD  mometasone-formoterol (DULERA) 200-5 MCG/ACT AERO Inhale 2 puffs into the lungs 2 (two) times daily. Patient not taking: Reported on 03/31/2018 01/05/18   Tanda Rockers, MD    Family History Family History  Problem Relation Age of Onset  . Diabetes Mother   . Heart attack Brother 51    Social History Social History   Tobacco Use  . Smoking status: Former Smoker    Packs/day: 0.50    Years: 22.00    Pack  years: 11.00    Types: Cigarettes    Last attempt to quit: 04/21/2006    Years since quitting: 11.9  . Smokeless tobacco: Never Used  . Tobacco comment: quit 10 years ago  Substance Use Topics  . Alcohol use: No    Alcohol/week: 0.0 standard drinks  . Drug use: No     Allergies   Patient has no known allergies.   Review of Systems Review of Systems  Constitutional: Negative for chills and fever.  HENT: Positive for trouble swallowing. Negative for congestion, drooling, ear pain, nosebleeds and sore throat.   Eyes: Negative for pain and visual disturbance.  Respiratory: Negative for cough, choking, shortness of breath and wheezing.   Cardiovascular: Negative for chest pain and palpitations.  Gastrointestinal: Negative for abdominal pain, anal bleeding, rectal pain and vomiting.  Genitourinary: Negative for dysuria, hematuria, vaginal bleeding, vaginal discharge and vaginal pain.  Musculoskeletal: Negative for arthralgias and back pain.  Skin: Negative for color change and rash.  Neurological: Negative for seizures and syncope.  All other systems reviewed and are negative.    Physical Exam Updated Vital Signs   ED Triage Vitals [03/31/18 1418]  Enc Vitals Group     BP (!) 145/74     Pulse Rate (!) 108     Resp 20     Temp 98 F (36.7 C)     Temp Source Oral     SpO2 100 %     Weight      Height      Head Circumference      Peak Flow      Pain Score 0     Pain Loc      Pain Edu?      Excl. in [place]?     Physical Exam  Constitutional: He is oriented to person, place, and time. He appears well-developed and well-nourished.  HENT:  Head: Normocephalic and atraumatic.  Mouth/Throat: Oropharynx is clear and moist. No oropharyngeal exudate.  No drooling, no change in phonation, no stridor  Eyes: Pupils are equal, round, and reactive to light. Conjunctivae and EOM are normal.  Neck: Normal range of motion. Neck supple.  Cardiovascular: Normal rate, regular rhythm,  normal heart sounds and intact distal pulses.  No murmur heard. Pulmonary/Chest: Effort normal and breath sounds normal. No respiratory distress.  Abdominal: Soft. There is no tenderness.  Musculoskeletal: Normal range of motion. He exhibits no edema.  Neurological: He is alert and oriented to person, place, and time.  Skin: Skin is warm and dry.  Psychiatric: He has a normal mood and affect.  Nursing note and vitals reviewed.    ED Treatments / Results  Labs (all labs ordered are listed, but only abnormal results are displayed) Labs Reviewed - No data to display  EKG None  Radiology No results found.  Procedures Procedures (including critical care time)  Medications Ordered in ED Medications  glucagon (human recombinant) (GLUCAGEN) injection 1 mg ( Intravenous MAR Unhold  03/31/18 1714)     Initial Impression / Assessment and Plan / ED Course  I have reviewed the triage vital signs and the nursing notes.  Pertinent labs & imaging results that were available during my care of the patient were reviewed by me and considered in my medical decision making (see chart for details).     Andra Matsuo is a 57 year old male with history of heart disease, esophageal stricture, reflux who presents to the ED with food bolus.  Patient with normal vitals.  No fever.  Patient was eating meat with breakfast about 6 hours ago and states that the food did not go all the way down and feels stuck in his throat.  He has been unable to drink water or have food since.  He denies any respiratory symptoms.  He is overall well-appearing.  No stridor, no drooling, no concern for respiratory compromise.  Suspect that patient likely has food bolus.  Clear breath sounds on exam.  Will give IV glucagon and GI was consulted as may need EGD for food bolus removal.  Patient otherwise well-appearing.  Will await GI consultation.  GI was able to successfully advance food bolus without any issues.  Patient  tolerated sedation well.  He was able to eat and drink without any issues and was discharged from ED in good condition. Will follow-up outpatient and dischargeD from the ED in good condition.  This chart was dictated using voice recognition software.  Despite best efforts to proofread,  errors can occur which can change the documentation meaning.   Final Clinical Impressions(s) / ED Diagnoses   Final diagnoses:  Food impaction of esophagus, initial encounter    ED Discharge Orders    None       Lennice Sites, DO 03/31/18 1800

## 2018-03-31 NOTE — ED Notes (Signed)
Spoke to dr hagler and dr Alphonzo Cruise about patient.  Patient is sure there is something in "throat" unable to drink water and have it go down.  Patient is alert, respirations regular and unlabored.  Patient speaks in complete sentences, intermittently coughs.  Patient and family member agreeable to go to ed for further evaluation.  Onset of symptoms started 3 hours ago

## 2018-03-31 NOTE — ED Triage Notes (Signed)
Pt in c/o possible piece of steak stuck in his throat, this happened this morning, he is able to maintain his saliva but has not been able to get any food or drink down, no distress noted, history of same

## 2018-03-31 NOTE — Telephone Encounter (Signed)
I spoke with the pt and he states that he is at the ED for evaluation.

## 2018-03-31 NOTE — Consult Note (Signed)
GERD.                                                                                                                                                                         [facility] Consult: 4:38 PM 03/31/2018  LOS: 0 days    Referring Provider:  Dr Vernell Barrier in ED  Primary Care Physician:  [place] Bing, DO Primary Gastroenterologist:  Dr. Owens Loffler    Reason for Consultation: Food impaction.   HPI: [name] is a 57 y.o. male.  PMH CAD.  MI with cardiac stenting 2008.  On chronic Plavix, last dose taken yesterday.  Asthma.  Chronic sinusitis.  Elevated IgE, has been on daily prednisone for many years. GERD.  DM 2, on oral agents.  Microcytic anemia, Hgb 10.9, MCV 64 in 07/2017.   Was treated with a prednisone pulse and then taper starting about 3 weeks ago, current dose is 20 mg a day.   Previous acute cough and respiratory distress is well under control and he denies dyspnea.  Patient required EGD with removal of pill impaction in 03/2017. Dr. Ardis Hughs removed partially dissolved metformin pill in the mid esophagus and noted mucosal changes consistent with esophageal candidiasis.  Patient was sent home with prescription of Diflucan daily for 10 days and omeprazole 20 mg daily. Patient had issues with ongoing dysphagia on follow-up with Dr. Ardis Hughs and underwent repeat upper endoscopy 09/25/2017.  Endoscopic findings included patient had esophageal changes consistent with yeast infection.  Benign, intrinsic, moderate stenosis at the GE junction was dilated up to 13 mm.   Swallowing wise, the patient has done well since June.  In the last few weeks he has noticed changes in his oral mucosa consistent with oral candidiasis but had not not developed recurrent dysphagia until this morning.  At 1030 today he was eating and swallowed a piece of meat which got stuck in the region of his lower esophagus, very similar to what happened a year ago.  Patient does not use NSAIDs.   He works as a Scientist, water quality.  Past Medical History:  Diagnosis Date  . Asthma   . CAD (coronary artery disease)   . Cataract 2018   both eyes  . Cellulitis of leg, right 10/2014  . Dental caries   . Diabetes mellitus    TYPE 2  . GERD (gastroesophageal reflux disease)   . Hyperlipidemia   . Hypertension 2017  . Myocardial infarction ([place]) 2008  . Thyroid nodule    biopsy negative 2006    Past Surgical History:  Procedure Laterality Date  . CORONARY STENT PLACEMENT    . ESOPHAGOGASTRODUODENOSCOPY N/A 03/29/2017   Procedure: ESOPHAGOGASTRODUODENOSCOPY (EGD);  Surgeon: Milus Banister, MD;  Location: [facility] ENDOSCOPY;  Service: Endoscopy;  Laterality: N/A;  . solitary pulm and thryoid nodules      Prior to Admission medications   Medication Sig Start Date End Date Taking? Authorizing Provider  albuterol (PROVENTIL HFA;VENTOLIN HFA) 108 (90 Base) MCG/ACT inhaler Inhale 2 puffs into the lungs every 4 (four) hours as needed for wheezing or shortness of breath (((PLAN B))). 01/05/18   Tanda Rockers, MD  albuterol (PROVENTIL) (2.5 MG/3ML) 0.083% nebulizer solution Take 2.5 mg by nebulization every 4 (four) hours as needed for wheezing or shortness of breath.    [provider]  aspirin (ADULT ASPIRIN EC LOW STRENGTH) 81 MG EC tablet Take 81 mg by mouth every morning.     [provider]  atorvastatin (LIPITOR) 80 MG tablet Take 0.5 tablets (40 mg total) by mouth daily. 11/27/17   Leeanne Rio, MD  calcium-vitamin D (OSCAL WITH D) 250-125 MG-UNIT tablet Take 1 tablet by mouth 2 (two) times daily.    [provider]  clopidogrel (PLAVIX) 75 MG tablet Take 1 tablet (75 mg total) by mouth every morning. 10/26/17   [place] Bing, DO  guaiFENesin (MUCINEX) 600 MG 12 hr tablet Take 600 mg by mouth 2 (two) times daily as needed.    [provider]  losartan (COZAAR) 100 MG tablet Take 50 mg by mouth daily.    [provider]  metFORMIN (GLUCOPHAGE)  1000 MG tablet Take 1 tablet (1,000 mg total) by mouth 2 (two) times daily with a meal. 11/27/17   Leeanne Rio, MD  mometasone (NASONEX) 50 MCG/ACT nasal spray Place 2 sprays into the nose daily as needed.    [provider]  mometasone-formoterol (DULERA) 100-5 MCG/ACT AERO Inhale 2 puffs into the lungs 2 (two) times daily. 11/09/17   Tanda Rockers, MD  mometasone-formoterol (DULERA) 200-5 MCG/ACT AERO Inhale 2 puffs into the lungs 2 (two) times daily. 01/05/18   Tanda Rockers, MD  Multiple Vitamin (MULTIVITAMIN) tablet Take 1 tablet by mouth every morning.     [provider]  omeprazole (PRILOSEC) 20 MG capsule Take 1 capsule daily 30-60 minutes before your first meal of the day    [provider]  predniSONE (DELTASONE) 10 MG tablet Take 10 mg by mouth daily with breakfast.    [provider]    Scheduled Meds: . glucagon (human recombinant)  1 mg Intravenous Once   Infusions:  PRN Meds:    Allergies as of 03/31/2018  . (No Known Allergies)    Family History  Problem Relation Age of Onset  . Diabetes Mother   . Heart attack Brother 54    Social History   Socioeconomic History  . Marital status: Married    Spouse name: Not on file  . Number of children: Not on file  . Years of education: Not on file  . Highest education level: Not on file  Occupational History  . Not on file  Social Needs  . Financial resource strain: Not on file  . Food insecurity:    Worry: Not on file    Inability: Not on file  . Transportation needs:    Medical: Not on file    Non-medical: Not on file  Tobacco Use  . Smoking status: Former Smoker    Packs/day: 0.50    Years: 22.00    Pack years: 11.00    Types: Cigarettes    Last attempt to quit: 04/21/2006    Years since quitting: 11.9  .  Smokeless tobacco: Never Used  . Tobacco comment: quit 10 years ago  Substance and Sexual Activity  . Alcohol use: No    Alcohol/week: 0.0 standard drinks    . Drug use: No  . Sexual activity: Yes    Birth control/protection: Condom  Lifestyle  . Physical activity:    Days per week: Not on file    Minutes per session: Not on file  . Stress: Not on file  Relationships  . Social connections:    Talks on phone: Not on file    Gets together: Not on file    Attends religious service: Not on file    Active member of club or organization: Not on file    Attends meetings of clubs or organizations: Not on file    Relationship status: Not on file  . Intimate partner violence:    Fear of current or ex partner: Not on file    Emotionally abused: Not on file    Physically abused: Not on file    Forced sexual activity: Not on file  Other Topics Concern  . Not on file  Social History Narrative   Lives with wife; recently moved to [place] from [place] due to health problems. Has no means of affording meds currently + tobacco 1/2ppd x 84yrs, no ETOH no drugs.    REVIEW OF SYSTEMS: Constitutional: No weakness, no fatigue. ENT:  No nose bleeds Pulm: No significant dyspnea on exertion.  No cough, no respiratory distress. CV:  No palpitations, no LE edema.  No chest pain GU:  No hematuria, no frequency GI: See HPI.  No constipation, no bloody stools. Heme: No excessive or unusual bleeding or bruising. Transfusions: None ever. Neuro:  No headaches, no peripheral tingling or numbness Derm:  No itching, no rash or sores.  Endocrine:  No sweats or chills.  No polyuria or dysuria Immunization: Up-to-date on multiple vaccinations including flu shot.    PHYSICAL EXAM: Vital signs in last 24 hours: Vitals:   03/31/18 1418  BP: (!) 145/74  Pulse: (!) 108  Resp: 20  Temp: 98 F (36.7 C)  SpO2: 100%   Wt Readings from Last 3 Encounters:  01/05/18 71.7 kg  11/27/17 71.2 kg  11/02/17 72.7 kg    General: Pleasant, comfortable looking, non-ill-appearing male Head: No facial asymmetry or swelling.  No signs of head trauma. Eyes: No scleral icterus, no  conjunctival pallor. Ears: Not hard of hearing Nose: No discharge or congestion Mouth: Slight, nonspecific white coating at the edges of the tongue.  Moist, pink, clear oral mucosa.  Tongue midline. Neck: No JVD, no masses, no thyromegaly. Lungs: No adventitious sounds, no labored breathing or cough.  Breath sounds overall diminished bilaterally Heart: RR.  Slightly tachy at 116.  No MRG.  S1, S2 present. Abdomen: Soft.  Active bowel sounds.  Not tender or distended.  No masses, HSM, bruits, hernias..   Rectal: Deferred Musc/Skeltl: No joint swelling, redness, significant deformities. Extremities: No CCE. Neurologic: Oriented x3.  No tremors, no limb weakness.  No gross deficits. Skin: No rashes, no sores, no telangiectasia. Tattoos: None observed. Nodes: No cervical adenopathy. Psych: Calm, pleasant, cooperative, good historian, fluid speech.  Intake/Output from previous day: No intake/output data recorded. Intake/Output this shift: No intake/output data recorded.  LAB RESULTS: No results for input(s): WBC, HGB, HCT, PLT in the last 72 hours. BMET Lab Results  Component Value Date   NA 144 08/14/2017   NA 137 03/28/2017   NA 139 11/12/2014  K 4.3 08/14/2017   K 4.4 03/28/2017   K 4.1 11/12/2014   CL 100 08/14/2017   CL 108 03/28/2017   CL 105 11/12/2014   CO2 22 08/14/2017   CO2 18 (L) 03/28/2017   CO2 27 11/12/2014   GLUCOSE 141 (H) 08/14/2017   GLUCOSE 242 (H) 03/28/2017   GLUCOSE 284 (H) 11/12/2014   BUN 14 08/14/2017   BUN 19 03/28/2017   BUN 9 11/12/2014   CREATININE 0.95 08/14/2017   CREATININE 1.02 03/28/2017   CREATININE 1.08 11/12/2014   CALCIUM 9.5 08/14/2017   CALCIUM 9.5 03/28/2017   CALCIUM 9.2 11/12/2014    RADIOLOGY STUDIES: No results found.   IMPRESSION:   *    Esophageal food impaction. 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  Prior history pill stuck in the esophagus, 03/2017.  *   Asthma.   Chronic prednisone, recent pulse dosing with resolution of resp sxs.    *   Microcytic anemia on 07/2017 labs.  This has not been worked up.   *   CAD.  MI and cardiac stenting in 2008.  On chronic Plavix, low-dose aspirin.  Last doses of these yesterday.    PLAN:     *   EGD now.  Case D/w Dr Loletha Carrow.     Azucena Freed  03/31/2018, 4:38 PM Phone [phone]

## 2018-03-31 NOTE — Op Note (Addendum)
[facility] Patient Name: [name] Procedure Date : 03/31/2018 MRN: [id number] Attending MD: Estill Cotta. Loletha Carrow , MD Date of Birth: 11/19/1960 CSN: [id number] Age: 57 Admit Type: Outpatient Procedure:                Upper GI endoscopy Indications:              Removal of foreign body in the esophagus (food                            impaction) Providers:                Estill Cotta. Loletha Carrow, MD, Vista Lawman, RN, Elspeth Cho, Technician Referring MD:             Zacarias Pontes ED physician Medicines:                Midazolam 3 mg IV, Fentanyl 25 micrograms IV,                            viscous lidocaine gargle and spit Complications:            No immediate complications. Estimated Blood Loss:     Estimated blood loss was minimal. Estimated blood                            loss: none. Procedure:                Pre-Anesthesia Assessment:                           - Prior to the procedure, a History and Physical                            was performed, and patient medications and                            allergies were reviewed. The patient's tolerance of                            previous anesthesia was also reviewed. The risks                            and benefits of the procedure and the sedation                            options and risks were discussed with the patient.                            All questions were answered, and informed consent                            was obtained. Prior Anticoagulants: The patient has  taken Plavix (clopidogrel), last dose was day of                            procedure. ASA Grade Assessment: III - A patient                            with severe systemic disease. After reviewing the                            risks and benefits, the patient was deemed in                            satisfactory condition to undergo the procedure.                           After obtaining  informed consent, the endoscope was                            passed under direct vision. Throughout the                            procedure, the patient's blood pressure, pulse, and                            oxygen saturations were monitored continuously. The                            [id number]-H190 ([id number]) Olympus Adult EGD was introduced                            through the mouth, and advanced to the duodenal                            bulb. The upper GI endoscopy was accomplished                            without difficulty. The patient tolerated the                            procedure. Scope In: Scope Out: Findings:      Food was found in the upper third of the esophagus. With insufflation,       it dropped into the stomach.      Esophagitis was found in the upper third of the esophagus. It was       somewhat friable with mild luminal narrowing. There may be candida, but       the area was not sampled because the patient was coughing and sedation       challenging (in the setting of food impaction).      One benign-appearing, intrinsic mild stenosis was found at the       gastroesophageal junction. This stenosis measured 1.4 cm (inner       diameter) x less than one cm (in length). The stenosis was traversed.      The stomach was normal.  The duodenal bulb was normal. Impression:               - Food in the upper third of the esophagus.                           - Esophagitis.                           - Normal stomach.                           - Normal duodenal bulb.                           - No specimens collected. Moderate Sedation:      Moderate (conscious) sedation was administered by the endoscopy nurse       and supervised by the endoscopist. The following parameters were       monitored: oxygen saturation, heart rate, blood pressure, and response       to care. Total physician intraservice time was 7 minutes. Recommendation:           - Patient has a contact  number available for                            emergencies. The signs and symptoms of potential                            delayed complications were discussed with the                            patient. Return to normal activities tomorrow.                            Written discharge instructions were provided to the                            patient.                           - Resume previous diet, but cut and chew meat very                            carefully.                           - Continue present medications.                           - Follow up in clinic with Dr. Ardis Hughs. Procedure Code(s):        --- Professional ---                           [phone], Esophagogastroduodenoscopy, flexible,                            transoral; diagnostic, including collection of  specimen(s) by brushing or washing, when performed                            (separate procedure) Diagnosis Code(s):        --- Professional ---                           K48.185U, Food in esophagus causing other injury,                            initial encounter                           K20.9, Esophagitis, unspecified                           T18.108A, Unspecified foreign body in esophagus                            causing other injury, initial encounter CPT copyright 2018 American Medical Association. All rights reserved. The codes documented in this report are preliminary and upon coder review may  be revised to meet current compliance requirements. Henry L. Loletha Carrow, MD 03/31/2018 5:38:47 PM This report has been signed electronically. Number of Addenda: 0

## 2018-04-02 ENCOUNTER — Encounter (HOSPITAL_COMMUNITY): Payer: Self-pay | Admitting: Gastroenterology

## 2018-04-29 ENCOUNTER — Encounter: Payer: Self-pay | Admitting: Family Medicine

## 2018-04-29 ENCOUNTER — Ambulatory Visit (INDEPENDENT_AMBULATORY_CARE_PROVIDER_SITE_OTHER): Payer: Self-pay | Admitting: Family Medicine

## 2018-04-29 VITALS — BP 134/72 | HR 74 | Temp 97.7°F | Wt 161.4 lb

## 2018-04-29 DIAGNOSIS — H259 Unspecified age-related cataract: Secondary | ICD-10-CM

## 2018-04-29 DIAGNOSIS — IMO0002 Reserved for concepts with insufficient information to code with codable children: Secondary | ICD-10-CM

## 2018-04-29 DIAGNOSIS — Z1211 Encounter for screening for malignant neoplasm of colon: Secondary | ICD-10-CM

## 2018-04-29 DIAGNOSIS — H547 Unspecified visual loss: Secondary | ICD-10-CM

## 2018-04-29 DIAGNOSIS — E1165 Type 2 diabetes mellitus with hyperglycemia: Secondary | ICD-10-CM

## 2018-04-29 DIAGNOSIS — J449 Chronic obstructive pulmonary disease, unspecified: Secondary | ICD-10-CM

## 2018-04-29 LAB — POCT GLYCOSYLATED HEMOGLOBIN (HGB A1C): HbA1c, POC (controlled diabetic range): 7.2 % — AB (ref 0.0–7.0)

## 2018-04-29 MED ORDER — METFORMIN HCL 1000 MG PO TABS: 1000.0000 mg | ORAL_TABLET | Freq: Two times a day (BID) | ORAL | 3 refills | Status: DC

## 2018-04-29 MED ORDER — PREDNISONE 10 MG PO TABS: 10.0000 mg | ORAL_TABLET | Freq: Every day | ORAL | 1 refills | Status: DC

## 2018-04-29 NOTE — Assessment & Plan Note (Signed)
Refer again to ophthalmology to see if someone will take the orange card. Stressed importance of calling eye doctors to get in as a self-pay patient if no one takes the orange card. Advised we are not able to treat vision loss as family doctors - needs to see eye doctor.

## 2018-04-29 NOTE — Assessment & Plan Note (Signed)
Overall stable. Refilled prednisone today. Has follow up in place with pulmonology.

## 2018-04-29 NOTE — Assessment & Plan Note (Signed)
A1c 7.2, well controlled Continue metformin  Refilled today, follow up with PCP in 3 months

## 2018-04-29 NOTE — Progress Notes (Signed)
Date of Visit: 04/29/2018   HPI:  Diabetes - morning fasting sugars 130s-140s. Takes metformin 1000mg  twice daily. No low sugars. No chest pain.   Asthma - has taken oral steroids daily since about 2005 in order to control his asthma. Currently also using dulera twice daily. Uses albuterol as needed, sometimes as much as 2-3 times per day but it just depends on how he's doing. His breathing overall is doing well and he reports it's not worse than in the past.  L eye vision issue - can see light out of left eye but otherwise has trouble seeing. Last saw eye doctor 3 years ago. Has no insurance, just the orange card. Symptoms are not worse than previously, just the same. I saw him in August and advised he needs to call around to various eye doctors to find out their out of pocket pricing since at that time there were no eye doctors who took the orange card. He has not called any eye doctors.  ROS: See HPI.  [place]: history of ischemic cardiomyopathy, COPD, cataracts, CAD, prior MI, hypertension, type 2 diabetes, GERD  PHYSICAL EXAM: BP 134/72   Pulse 74   Temp 97.7 F (36.5 C) (Oral)   Wt 161 lb 6.4 oz (73.2 kg)   SpO2 98%   BMI 25.09 kg/m  Gen: no acute distress, pleasant, cooperative, well appearing HEENT: normocephalic, atraumatic. Moist mucous membranes. Wearing glasses. Pupils equal round and reactive to light. No scleral injection. Heart: regular rate and rhythm, no murmur Lungs: clear to auscultation bilaterally, normal work of breathing  Neuro: alert, grossly nonfocal, speech normal  ASSESSMENT/PLAN:  Health maintenance:  -refer to GI for colonoscopy, patient agreeable -refer to ophtho for eye exam -otherwise UTD on HM items  Uncontrolled type 2 diabetes mellitus, without long-term current use of insulin (HCC) A1c 7.2, well controlled Continue metformin  Refilled today, follow up with PCP in 3 months   COPD with asthma ([place]) Overall stable. Refilled prednisone  today. Has follow up in place with pulmonology.  Cataracts, bilateral Refer again to ophthalmology to see if someone will take the orange card. Stressed importance of calling eye doctors to get in as a self-pay patient if no one takes the orange card. Advised we are not able to treat vision loss as family doctors - needs to see eye doctor.  FOLLOW UP: Follow up in 3 months with PCP for diabetes Refer to ophtho Refer to GI for colonoscopy  [name], [place]

## 2018-04-29 NOTE — Patient Instructions (Signed)
It was great to see you again today!  Please call different eye doctors to find out their costs for out of pocket and schedule with one. I will put in a referral to see if someone will take the orange card. We cannot fix your eye here at the [facility].  Stay on current medications Refilled prednisone and metformin  Follow up with Dr. Yisroel Ramming in 3 months   Be well, Dr. Ardelia Mems

## 2018-05-10 ENCOUNTER — Encounter: Payer: Self-pay | Admitting: Internal Medicine

## 2018-05-18 ENCOUNTER — Telehealth: Payer: Self-pay

## 2018-05-18 ENCOUNTER — Encounter: Payer: Self-pay | Admitting: Physician Assistant

## 2018-05-18 ENCOUNTER — Ambulatory Visit (INDEPENDENT_AMBULATORY_CARE_PROVIDER_SITE_OTHER): Payer: Self-pay | Admitting: Physician Assistant

## 2018-05-18 ENCOUNTER — Telehealth: Payer: Self-pay | Admitting: Family Medicine

## 2018-05-18 ENCOUNTER — Encounter: Payer: Self-pay | Admitting: Family Medicine

## 2018-05-18 VITALS — BP 130/80 | HR 80 | Ht 68.0 in | Wt 150.6 lb

## 2018-05-18 DIAGNOSIS — Z1211 Encounter for screening for malignant neoplasm of colon: Secondary | ICD-10-CM

## 2018-05-18 DIAGNOSIS — Z7901 Long term (current) use of anticoagulants: Secondary | ICD-10-CM

## 2018-05-18 DIAGNOSIS — Z1212 Encounter for screening for malignant neoplasm of rectum: Secondary | ICD-10-CM

## 2018-05-18 DIAGNOSIS — Z01818 Encounter for other preprocedural examination: Secondary | ICD-10-CM

## 2018-05-18 MED ORDER — PEG 3350-KCL-NA BICARB-NACL 420 G PO SOLR: 4000.0000 mL | Freq: Once | ORAL | 0 refills | Status: AC

## 2018-05-18 MED FILL — GOLYTELY SOLUTION: 236 | 1 days supply | Qty: 4000 | Fill #0

## 2018-05-18 NOTE — Telephone Encounter (Signed)
Letter by Levin Erp, PA on 05/18/2018     [facility] [place], [place]  [address] Phone:  [phone]   Fax:  [phone]   05/18/2018   RE:      [name] DOB:   1960-11-08 MRN:   [id number]   Dear Dr. Yisroel Ramming,    We have scheduled the above patient for an endoscopic procedure. Our records show that he is on anticoagulation therapy.   Please advise as to whether the patient may come off his therapy of Plavix 5 days  prior to the colonoscopy procedure, which is scheduled for 05/31/18.  Please fax back/ or route to [place], [place] at [phone].  Sincerely,    Thurmon Fair, [facility]                       [place], [phone]                   1

## 2018-05-18 NOTE — Progress Notes (Signed)
I agree with the above note, plan 

## 2018-05-18 NOTE — Progress Notes (Signed)
Chief Complaint: Preprocedural visit for a screening colonoscopy  HPI:    [name] is a 58 year old male with a past medical history as listed below including MI with cardiac stenting in 2008 on Plavix, known to Dr. Ardis Hughs, who was referred to me by Leeanne Rio, MD for a preprocedural visit for a screening colonoscopy on chronic anticoagulation.      03/31/2018 consult by [place] in the hospital for food impaction.  At that time had EGD with esophagitis in the upper third of the esophagus.  Food impaction removed.    Today, patient presents clinic and explains that he has had no further difficulty swallowing, tells me that prior to having EGD he was unable to swallow rice and now he can.  He has no upper GI complaints and tells me he is just here for his colonoscopy.    Patient maintained on Plavix from his PCP Dr. Pennie Rushing for an MI back in 2008 and stenting at that time, patient denies any new acute changes in cardiac health.    Denies fever, chills, weight loss, anorexia, nausea, vomiting, heartburn, reflux, change in bowel habits or abdominal pain.  Past Medical History:  Diagnosis Date  . Asthma   . CAD (coronary artery disease)   . Cataract 2018   both eyes  . Cellulitis of leg, right 10/2014  . Dental caries   . Diabetes mellitus    TYPE 2  . GERD (gastroesophageal reflux disease)   . Hyperlipidemia   . Hypertension 2017  . Myocardial infarction ([place]) 2008  . Thyroid nodule    biopsy negative 2006    Past Surgical History:  Procedure Laterality Date  . CORONARY STENT PLACEMENT    . ESOPHAGOGASTRODUODENOSCOPY N/A 03/29/2017   Procedure: ESOPHAGOGASTRODUODENOSCOPY (EGD);  Surgeon: Milus Banister, MD;  Location: [facility] ENDOSCOPY;  Service: Endoscopy;  Laterality: N/A;  . ESOPHAGOGASTRODUODENOSCOPY N/A 03/31/2018   Procedure: ESOPHAGOGASTRODUODENOSCOPY (EGD);  Surgeon: Doran Stabler, MD;  Location: [facility];  Service: Gastroenterology;  Laterality: N/A;  . FOREIGN  BODY REMOVAL N/A 03/31/2018   Procedure: FOREIGN BODY REMOVAL;  Surgeon: Doran Stabler, MD;  Location: [facility];  Service: Gastroenterology;  Laterality: N/A;  . solitary pulm and thryoid nodules      Current Outpatient Medications  Medication Sig Dispense Refill  . albuterol (PROVENTIL HFA;VENTOLIN HFA) 108 (90 Base) MCG/ACT inhaler Inhale 2 puffs into the lungs every 4 (four) hours as needed for wheezing or shortness of breath (((PLAN B))). 3 Inhaler 0  . atorvastatin (LIPITOR) 80 MG tablet Take 0.5 tablets (40 mg total) by mouth daily. 45 tablet 3  . calcium-vitamin D (OSCAL WITH D) 250-125 MG-UNIT tablet Take 1 tablet by mouth 2 (two) times daily.    . clopidogrel (PLAVIX) 75 MG tablet Take 1 tablet (75 mg total) by mouth every morning. 90 tablet 3  . lisinopril (PRINIVIL,ZESTRIL) 20 MG tablet Take 20 mg by mouth daily.    . metFORMIN (GLUCOPHAGE) 1000 MG tablet Take 1 tablet (1,000 mg total) by mouth 2 (two) times daily with a meal. 180 tablet 3  . mometasone-formoterol (DULERA) 100-5 MCG/ACT AERO Inhale 2 puffs into the lungs 2 (two) times daily. 1 Inhaler 0  . mometasone-formoterol (DULERA) 200-5 MCG/ACT AERO Inhale 2 puffs into the lungs 2 (two) times daily. 1 Inhaler 0  . predniSONE (DELTASONE) 10 MG tablet Take 1 tablet (10 mg total) by mouth daily with breakfast. 90 tablet 1  . polyethylene glycol-electrolytes (NULYTELY/GOLYTELY) 420 g  solution Take 4,000 mLs by mouth once for 1 dose. 4000 mL 0   No current facility-administered medications for this visit.     Allergies as of 05/18/2018  . (No Known Allergies)    Family History  Problem Relation Age of Onset  . Diabetes Mother   . Heart attack Brother 48    Social History   Socioeconomic History  . Marital status: Married    Spouse name: Not on file  . Number of children: 3  . Years of education: Not on file  . Highest education level: Not on file  Occupational History  . Not on file  Social Needs  .  Financial resource strain: Not on file  . Food insecurity:    Worry: Not on file    Inability: Not on file  . Transportation needs:    Medical: Not on file    Non-medical: Not on file  Tobacco Use  . Smoking status: Former Smoker    Packs/day: 0.50    Years: 22.00    Pack years: 11.00    Types: Cigarettes    Last attempt to quit: 04/21/2006    Years since quitting: 12.0  . Smokeless tobacco: Never Used  . Tobacco comment: quit 10 years ago  Substance and Sexual Activity  . Alcohol use: No    Alcohol/week: 0.0 standard drinks  . Drug use: No  . Sexual activity: Yes    Birth control/protection: Condom  Lifestyle  . Physical activity:    Days per week: Not on file    Minutes per session: Not on file  . Stress: Not on file  Relationships  . Social connections:    Talks on phone: Not on file    Gets together: Not on file    Attends religious service: Not on file    Active member of club or organization: Not on file    Attends meetings of clubs or organizations: Not on file    Relationship status: Not on file  . Intimate partner violence:    Fear of current or ex partner: Not on file    Emotionally abused: Not on file    Physically abused: Not on file    Forced sexual activity: Not on file  Other Topics Concern  . Not on file  Social History Narrative   Lives with wife; recently moved to [place] from [place] due to health problems. Has no means of affording meds currently + tobacco 1/2ppd x 21yrs, no ETOH no drugs.    Review of Systems:    Constitutional: No weight loss, fever or chills Cardiovascular: No chest pain Respiratory: No SOB  Gastrointestinal: See HPI and otherwise negative   Physical Exam:  Vital signs: BP 130/80   Pulse 80   Ht 5\' 8"  (1.727 m)   Wt 150 lb 9.6 oz (68.3 kg)   BMI 22.90 kg/m   Constitutional:   Pleasant male appears to be in NAD, Well developed, Well nourished, alert and cooperative Respiratory: Respirations even and unlabored. Lungs clear to  auscultation bilaterally.   No wheezes, crackles, or rhonchi.  Cardiovascular: Normal S1, S2. No MRG. Regular rate and rhythm. No peripheral edema, cyanosis or pallor.  Gastrointestinal:  Soft, nondistended, nontender. No rebound or guarding. Normal bowel sounds. No appreciable masses or hepatomegaly. Psychiatric:  Demonstrates good judgement and reason without abnormal affect or behaviors.  No recent labs or imaging.  Assessment: 1.  Preprocedural visit for screening colonoscopy: No previous screening colonoscopy 2.  Chronic anticoagulation:  For an MI in 2008 status post stenting on Plavix  Plan: 1.  Scheduled patient for screening colonoscopy in the [place] with Dr. Ardis Hughs.  Did discuss risks, benefits, limitations and alternatives and patient agrees to proceed. 2.  Patient was advised to hold his Plavix for 5 days prior to time procedure.  We will communicate with his prescribing physician to ensure that holding his Plavix is acceptable for him. 3.  Discussed with patient that since he is no longer having any upper GI symptoms, there is no need for him to have repeat EGD at this time.  If these symptoms develop then he can have repeat EGD in the future. 4.  Patient to follow in clinic per recommendations from Dr. Ardis Hughs after time of procedure.  Ellouise Newer, PA-C [facility] 05/18/2018, 10:04 AM  Cc: Leeanne Rio, MD

## 2018-05-18 NOTE — Patient Instructions (Signed)
If you are age 60 or older, your body mass index should be between 23-30. Your Body mass index is 22.9 kg/m. If this is out of the aforementioned range listed, please consider follow up with your Primary Care Provider.  If you are age 92 or younger, your body mass index should be between 19-25. Your Body mass index is 22.9 kg/m. If this is out of the aformentioned range listed, please consider follow up with your Primary Care Provider.   You have been scheduled for a colonoscopy. Please follow written instructions given to you at your visit today.  Please pick up your prep supplies at the pharmacy within the next 1-3 days. If you use inhalers (even only as needed), please bring them with you on the day of your procedure. Your physician has requested that you go to www.startemmi.com and enter the access code given to you at your visit today. This web site gives a general overview about your procedure. However, you should still follow specific instructions given to you by our office regarding your preparation for the procedure.  We have sent the following medications to your pharmacy for you to pick up at your convenience: Golytely  You will be contacted by our office prior to your procedure for directions on holding your Plavix.  If you do not hear from our office 1 week prior to your scheduled procedure, please call [phone] to discuss.   Thank you for choosing me and [facility].   Tye Savoy, NP;

## 2018-05-18 NOTE — Telephone Encounter (Signed)
Received letter from GI office requesting discontinuation of Plavix 5 days prior to scheduled colonoscopy on 05/31/2018.  Contacted cardiology office regarding discontinuation of Plavix altogether given single DES of LAD back in 2008.  Cardiology in agreement with discontinuation.  Will inform patient.  Harriet Butte, [place], PGY-3

## 2018-05-19 ENCOUNTER — Telehealth: Payer: Self-pay

## 2018-05-19 NOTE — Telephone Encounter (Signed)
Called patient and informed him that per Dr. Yisroel Ramming that he should discontinue Plavix indefinitely.  Plavix has also been removed from med list.  Patient verbalized understanding and says he will stop immediately.  Per notes by Dr. Yisroel Ramming, printed copy of letter stating the above and faxed to [facility] at [place] [phone]).  Ozella Almond, [place]

## 2018-05-21 NOTE — Telephone Encounter (Signed)
Received letter from Dr Yisroel Ramming stated that patient may discontinue Plavix indefinitely.  That this was discussed with Dr. Johnsie Cancel as well.  I spoke with patient and he stated that his Plavix and been discontinued.  Pt verbalized understanding of our conversation regarding his procedure.  Letter from Dr. Yisroel Ramming scanned to chart.

## 2018-05-31 ENCOUNTER — Ambulatory Visit (AMBULATORY_SURGERY_CENTER): Payer: Self-pay | Admitting: Gastroenterology

## 2018-05-31 ENCOUNTER — Encounter: Payer: Self-pay | Admitting: Gastroenterology

## 2018-05-31 VITALS — BP 123/69 | HR 72 | Temp 98.0°F | Resp 16 | Ht 68.0 in | Wt 150.0 lb

## 2018-05-31 DIAGNOSIS — D120 Benign neoplasm of cecum: Secondary | ICD-10-CM

## 2018-05-31 DIAGNOSIS — Z1211 Encounter for screening for malignant neoplasm of colon: Secondary | ICD-10-CM

## 2018-05-31 DIAGNOSIS — D122 Benign neoplasm of ascending colon: Secondary | ICD-10-CM

## 2018-05-31 MED ORDER — SODIUM CHLORIDE 0.9 % IV SOLN: 500.0000 mL | Freq: Once | INTRAVENOUS | Status: DC

## 2018-05-31 NOTE — Progress Notes (Signed)
Report given to PACU, vss 

## 2018-05-31 NOTE — Op Note (Addendum)
[facility] Patient Name: [name] Procedure Date: 05/31/2018 8:41 AM MRN: [id number] Endoscopist: Milus Banister , MD Age: 58 Referring MD:  Date of Birth: [birth year] Gender: Male Account #: [id number] Procedure:                Colonoscopy Indications:              Screening for colorectal malignant neoplasm Medicines:                Monitored Anesthesia Care Procedure:                Pre-Anesthesia Assessment:                           - Prior to the procedure, a History and Physical                            was performed, and patient medications and                            allergies were reviewed. The patient's tolerance of                            previous anesthesia was also reviewed. The risks                            and benefits of the procedure and the sedation                            options and risks were discussed with the patient.                            All questions were answered, and informed consent                            was obtained. Prior Anticoagulants: The patient has                            taken Plavix (clopidogrel), last dose was 5 days                            prior to procedure. ASA Grade Assessment: II - A                            patient with mild systemic disease. After reviewing                            the risks and benefits, the patient was deemed in                            satisfactory condition to undergo the procedure.                           After obtaining informed consent, the colonoscope  was passed under direct vision. Throughout the                            procedure, the patient's blood pressure, pulse, and                            oxygen saturations were monitored continuously. The                            Colonoscope was introduced through the anus and                            advanced to the the cecum, identified by                            appendiceal  orifice and ileocecal valve. The                            colonoscopy was performed without difficulty. The                            patient tolerated the procedure well. The quality                            of the bowel preparation was good. The ileocecal                            valve, appendiceal orifice, and rectum were                            photographed. Scope In: 8:48:16 AM Scope Out: 9:01:40 AM Scope Withdrawal Time: 0 hours 11 minutes 46 seconds  Total Procedure Duration: 0 hours 13 minutes 24 seconds  Findings:                 Three sessile polyps were found in the ascending                            colon and cecum. The polyps were 2 to 4 mm in size.                            These polyps were removed with a cold snare.                            Resection and retrieval were complete.                           The exam was otherwise without abnormality on                            direct and retroflexion views. Complications:            No immediate complications. Estimated blood loss:  None. Estimated Blood Loss:     Estimated blood loss: none. Impression:               - Three 2 to 4 mm polyps in the ascending colon and                            in the cecum, removed with a cold snare. Resected                            and retrieved.                           - The examination was otherwise normal on direct                            and retroflexion views. Recommendation:           - Patient has a contact number available for                            emergencies. The signs and symptoms of potential                            delayed complications were discussed with the                            patient. Return to normal activities tomorrow.                            Written discharge instructions were provided to the                            patient.                           - Resume previous diet.                            - Continue present medications. He can restart his                            plavix tomorrow.                           You will receive a letter within 2-3 weeks with the                            pathology results and my final recommendations.                           If the polyp(s) is proven to be 'pre-cancerous' on                            pathology, you will need repeat colonoscopy in 3-5  years. If the polyp(s) is NOT 'precancerous' on                            pathology then you should repeat colon cancer                            screening in 10 years with colonoscopy without need                            for colon cancer screening by any method prior to                            then (including stool testing). Milus Banister, MD 05/31/2018 9:04:03 AM This report has been signed electronically.

## 2018-05-31 NOTE — Patient Instructions (Signed)
Handout given for polyps.  YOU HAD AN ENDOSCOPIC PROCEDURE TODAY AT THE [facility]:   Refer to the procedure report that was given to you for any specific questions about what was found during the examination.  If the procedure report does not answer your questions, please call your gastroenterologist to clarify.  If you requested that your care partner not be given the details of your procedure findings, then the procedure report has been included in a sealed envelope for you to review at your convenience later.  YOU SHOULD EXPECT: Some feelings of bloating in the abdomen. Passage of more gas than usual.  Walking can help get rid of the air that was put into your GI tract during the procedure and reduce the bloating. If you had a lower endoscopy (such as a colonoscopy or flexible sigmoidoscopy) you may notice spotting of blood in your stool or on the toilet paper. If you underwent a bowel prep for your procedure, you may not have a normal bowel movement for a few days.  Please Note:  You might notice some irritation and congestion in your nose or some drainage.  This is from the oxygen used during your procedure.  There is no need for concern and it should clear up in a day or so.  SYMPTOMS TO REPORT IMMEDIATELY:   Following lower endoscopy (colonoscopy or flexible sigmoidoscopy):  Excessive amounts of blood in the stool  Significant tenderness or worsening of abdominal pains  Swelling of the abdomen that is new, acute  Fever of 100F or higher  For urgent or emergent issues, a gastroenterologist can be reached at any hour by calling [phone].   DIET:  We do recommend a small meal at first, but then you may proceed to your regular diet.  Drink plenty of fluids but you should avoid alcoholic beverages for 24 hours.  ACTIVITY:  You should plan to take it easy for the rest of today and you should NOT DRIVE or use heavy machinery until tomorrow (because of the sedation  medicines used during the test).    FOLLOW UP: Our staff will call the number listed on your records the next business day following your procedure to check on you and address any questions or concerns that you may have regarding the information given to you following your procedure. If we do not reach you, we will leave a message.  However, if you are feeling well and you are not experiencing any problems, there is no need to return our call.  We will assume that you have returned to your regular daily activities without incident.  If any biopsies were taken you will be contacted by phone or by letter within the next 1-3 weeks.  Please call us at [phone] if you have not heard about the biopsies in 3 weeks.    SIGNATURES/CONFIDENTIALITY: You and/or your care partner have signed paperwork which will be entered into your electronic medical record.  These signatures attest to the fact that that the information above on your After Visit Summary has been reviewed and is understood.  Full responsibility of the confidentiality of this discharge information lies with you and/or your care-partner. 

## 2018-05-31 NOTE — Progress Notes (Signed)
Called to room to assist during endoscopic procedure.  Patient ID and intended procedure confirmed with present staff. Received instructions for my participation in the procedure from the performing physician.  

## 2018-05-31 NOTE — Addendum Note (Signed)
Addended by: Darron Doom on: 05/31/2018 01:02 PM   Modules accepted: Orders

## 2018-06-01 ENCOUNTER — Telehealth: Payer: Self-pay

## 2018-06-01 NOTE — Telephone Encounter (Signed)
First post procedure follow up call, no answer 

## 2018-06-01 NOTE — Telephone Encounter (Signed)
No answer, left message to call back if having any issues or concerns, B.Shalah Estelle RN. 

## 2018-06-02 ENCOUNTER — Encounter: Payer: Self-pay | Admitting: Gastroenterology

## 2018-06-15 ENCOUNTER — Encounter: Payer: Self-pay | Admitting: Internal Medicine

## 2018-07-06 ENCOUNTER — Ambulatory Visit: Payer: Self-pay | Admitting: Internal Medicine

## 2018-07-12 ENCOUNTER — Other Ambulatory Visit: Payer: Self-pay

## 2018-07-12 DIAGNOSIS — E1165 Type 2 diabetes mellitus with hyperglycemia: Secondary | ICD-10-CM

## 2018-07-12 DIAGNOSIS — IMO0002 Reserved for concepts with insufficient information to code with codable children: Secondary | ICD-10-CM

## 2018-07-12 MED ORDER — METFORMIN HCL 1000 MG PO TABS: 1000.0000 mg | ORAL_TABLET | Freq: Two times a day (BID) | ORAL | 3 refills | Status: DC

## 2018-07-12 MED ORDER — PREDNISONE 10 MG PO TABS: 10.0000 mg | ORAL_TABLET | Freq: Every day | ORAL | 1 refills | Status: DC

## 2018-07-13 ENCOUNTER — Ambulatory Visit (INDEPENDENT_AMBULATORY_CARE_PROVIDER_SITE_OTHER): Payer: Self-pay | Admitting: Internal Medicine

## 2018-07-13 ENCOUNTER — Encounter: Payer: Self-pay | Admitting: Internal Medicine

## 2018-07-13 ENCOUNTER — Other Ambulatory Visit: Payer: Self-pay

## 2018-07-13 VITALS — BP 140/84 | HR 80 | Temp 98.0°F | Ht 68.0 in | Wt 161.0 lb

## 2018-07-13 DIAGNOSIS — J449 Chronic obstructive pulmonary disease, unspecified: Secondary | ICD-10-CM

## 2018-07-13 DIAGNOSIS — I10 Essential (primary) hypertension: Secondary | ICD-10-CM

## 2018-07-13 MED ORDER — MOMETASONE FURO-FORMOTEROL FUM 200-5 MCG/ACT IN AERO: 2.0000 | INHALATION_SPRAY | Freq: Two times a day (BID) | RESPIRATORY_TRACT | 0 refills | Status: DC

## 2018-07-13 MED ORDER — ALBUTEROL SULFATE HFA 108 (90 BASE) MCG/ACT IN AERS: 2.0000 | INHALATION_SPRAY | RESPIRATORY_TRACT | 3 refills | Status: DC | PRN

## 2018-07-13 MED ORDER — MOMETASONE FURO-FORMOTEROL FUM 200-5 MCG/ACT IN AERO: 2.0000 | INHALATION_SPRAY | Freq: Two times a day (BID) | RESPIRATORY_TRACT | 3 refills | Status: DC
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
## 2018-07-13 NOTE — Assessment & Plan Note (Signed)
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 2008  - PFT's 2007  FEV1  1.78 (50%) ratio 52 and 19% resp to saba, dlco 75% - d/c coreg 09/03/2013  - spirometry 12/18/2014 >  FEV1 1.04 (34%) ratio 53     > trial of dulera 200 2bid and prednisone 5 mg daily   - Spiriva respimat added 05/01/2015  - max gerd rx1/01/2016 > improved 06/12/2015   - FENO 12/21/2015  =   9  p am symb 160 rx - Spirometry 12/21/2015  FEV1 1.33 (47%)  Ratio 53 p am symb 160/spiriva  - Spirometry 06/12/2017  FEV1 1.43 (49%)  Ratio 51 p dulera and off spiriva xone week so leave off spriva - 07/13/2018  After extensive coaching inhaler device,  effectiveness =    90% with hfa > continue dulera 200 2bid and reduce pred floor to 5 mg    All goals of chronic asthma control met including optimal (though not nl as this is ACOS)  function and elimination of symptoms with minimal need for rescue therapy as long as using dulera consistently   Contingencies discussed in full including contacting this office immediately if not controlling the symptoms using the rule of two's.     The goal with a chronic steroid dependent illness is always arriving at the lowest effective dose that controls the disease/symptoms and not accepting a set "formula" which is based on statistics or guidelines that don't always take into account patient  variability or the natural hx of the dz in every individual patient, which may well vary over time.  For now therefore I recommend the patient maintain  20 mg ceiling and 5 mg floor

## 2018-07-13 NOTE — Assessment & Plan Note (Signed)
Changed from acei to ARB due to  Dysphagia/ choking sensation 04/15/17  - Back on lisinopril at pulmonary f/u 07/13/2018 with some throat irritation but not severe   Very low threshold to d/c ACEi based on today's hx and exam though symptoms are not prohibitive to acei use and pt not aware re voice change and throat clearing that are obvious to me vs prior ovs   Neverless,  ACE inhibitors are problematic in  pts with airway complaints because  even experienced pulmonologists can't always distinguish ace effects from copd/asthma/allergy with pnds.  By themselves they don't actually cause a problem, much like oxygen can't by itself start a fire, but they certainly serve as a powerful catalyst or enhancer for any "fire"  or inflammatory process in the upper airway, be it caused by an ET  tube or more commonly reflux (especially in the obese or pts with known GERD or who are on biphoshonates).   If upper airway symptoms or perceived need for prednisone increase, then reconsider ACEi here but defer this call to cards as symptoms are mild for now    I had an extended discussion with the patient reviewing all relevant studies completed to date and  lasting 15 to 20 minutes of a 25 minute visit    See device teaching which extended face to face time for this visit  Each maintenance medication was reviewed in detail including most importantly the difference between maintenance and prns and under what circumstances the prns are to be triggered using an action plan format that is not reflected in the computer generated alphabetically organized AVS but trather by a customized med calendar that reflects the AVS meds with confirmed 100% correlation.   In addition, Please see AVS for unique instructions that I personally wrote and verbalized to the the pt in detail and then reviewed with pt  by my nurse highlighting any  changes in therapy recommended at today's visit to their plan of care.

## 2018-07-13 NOTE — Patient Instructions (Addendum)
Try prednisone 10 mg one half daily and if worse go to 10 mg one-half alternating with one pill  daily  If throat irritation or throat clearing or cough get  worse, most likely this is from your lisinopril which may need to be changed to an alternative by your cardiologist   See calendar for specific medication instructions and bring it back for each and every office visit for every healthcare provider you see.  Without it,  you may not receive the best quality medical care that we feel you deserve.  You will note that the calendar groups together  your maintenance  medications that are timed at particular times of the day.  Think of this as your checklist for what your doctor has instructed you to do until your next evaluation to see what benefit  there is  to staying on a consistent group of medications intended to keep you well.  The other group at the bottom is entirely up to you to use as you see fit  for specific symptoms that may arise between visits that require you to treat them on an as needed basis.  Think of this as your action plan or "what if" list.   Separating the top medications from the bottom group is fundamental to providing you adequate care going forward.   Please schedule a follow up visit in 6 months but call sooner if needed

## 2018-07-28 ENCOUNTER — Telehealth: Payer: Self-pay | Admitting: Physician Assistant

## 2018-07-28 NOTE — Telephone Encounter (Signed)
   Chart Scrub Note:  - Patient is appropriate for virtual office visit - 13 month OV f/u - please call him to transition him, and offer him video vs telephone - Please get him signed up for MyChart - I have since decided to use Doximity video dialer instead of Webex which merely involves the patient receiving a text at the time of their visit to launch the video (must be on a smartphone). The workflow would be for my assist to call him on the phone 15 mins prior to visit to get vitals and meds, then I will send him a text via mobile phone to launch the video. If he decides to proceed with video visit, please let him know the plan  Melina Copa PA-C

## 2018-07-28 NOTE — Telephone Encounter (Signed)
Call placed to pt re: message below. Left pt a message to call back.

## 2018-07-29 ENCOUNTER — Encounter: Payer: Self-pay | Admitting: *Deleted

## 2018-07-29 NOTE — Telephone Encounter (Signed)
Call placed to pt re: appt, 08/05/2018.  Left another message for pt to call back. Also sent pt a message on his mychart.

## 2018-07-30 ENCOUNTER — Telehealth: Payer: Self-pay | Admitting: Physician Assistant

## 2018-07-30 NOTE — Telephone Encounter (Signed)
Patient has been call x 2 regarding 4/16 appointment/[facility]vm[facility]

## 2018-08-03 ENCOUNTER — Encounter: Payer: Self-pay | Admitting: Physician Assistant

## 2018-08-03 NOTE — Progress Notes (Deleted)
{Choose 1 Note Type (Telehealth Visit or Telephone Visit):[phone]   Evaluation Performed:  Follow-up visit  Date:  08/03/2018   ID:  [name], DOB 1960/06/07, MRN [id number]  {Patient Location:[phone]::"Home"}  {Provider Location:[phone]}  PCP:  [place] Bing, DO  Cardiologist:  Lauree Chandler, MD  Electrophysiologist:  None   Chief Complaint:  F/u CAD, cardiomyopathy  History of Present Illness:    [name] is a 58 y.o. male with CAD s/p anterior MI in 2008 treated with DES to the LAD, ischemic cardiomyopathy, reactive airway disease, COPD with severe atopic asthma on/off daily steroids, anemia by 07/2017 labs, DM, HLD, intermittent noncompliance who presents for tele-health visit. He had prior MI as above, with EF 45%. Myoview 2011 showed no ischemia LVEF 40%. 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 He is not on a beta-blocker due to reactive airway disease. He was last seen in 06/2017 after going over 2 years without OV. At that time he had run out of his Lipitor and Plavix and it was refilled. His BP tended to be up when on steroids but low when off. Last labs 04/2018 showed A1C 7.2, 07/2017 TSH 1.090, LDL 91, K 4.3, Cr 0.95, LFTs wnl, WBC 15k, Hgb 10.9 (down from 12.1) - microcytic, plt 321. It was challenging to reach him to discuss converting his visit to a telehealth platform.  The patient {does/does not:200015} have symptoms concerning for COVID-19 infection (fever, chills, cough, or new shortness of breath).   clinical CHF? BP med titration?  CAD Ischemic cardimyopathy Hyperlipidemia Microcytic anemia     Past Medical History:  Diagnosis Date  . Asthma   . CAD (coronary artery disease)    a. CAD s/p anterior MI in 2008 treated with DES to the LAD.  Marland Kitchen Cataract 2018   both eyes  . Cellulitis of leg, right  10/2014  . COPD (chronic obstructive pulmonary disease) ([place])   . Dental caries   . Diabetes mellitus    TYPE 2  . GERD (gastroesophageal reflux disease)   . Hyperlipidemia   . Hypertension 2017  . Ischemic cardiomyopathy   . Microcytic anemia   . Myocardial infarction ([place]) 2008  . Reactive airway disease   . Thyroid nodule    biopsy negative 2006   Past Surgical History:  Procedure Laterality Date  . CORONARY STENT PLACEMENT    . ESOPHAGOGASTRODUODENOSCOPY N/A 03/29/2017   Procedure: ESOPHAGOGASTRODUODENOSCOPY (EGD);  Surgeon: Milus Banister, MD;  Location: [facility] ENDOSCOPY;  Service: Endoscopy;  Laterality: N/A;  . ESOPHAGOGASTRODUODENOSCOPY N/A 03/31/2018   Procedure: ESOPHAGOGASTRODUODENOSCOPY (EGD);  Surgeon: Doran Stabler, MD;  Location: [facility];  Service: Gastroenterology;  Laterality: N/A;  . FOREIGN BODY REMOVAL N/A 03/31/2018   Procedure: FOREIGN BODY REMOVAL;  Surgeon: Doran Stabler, MD;  Location: [facility];  Service: Gastroenterology;  Laterality: N/A;  . solitary pulm and thryoid nodules       No outpatient medications have been marked as taking for the 08/05/18 encounter (Appointment) with Charlie Pitter, PA-C.     Allergies:   Patient has no known allergies.   Social History   Tobacco Use  . Smoking status: Former Smoker    Packs/day: 0.50    Years: 22.00    Pack years: 11.00    Types: Cigarettes    Last attempt to quit: 04/21/2006    Years  since quitting: 12.2  . Smokeless tobacco: Never Used  . Tobacco comment: quit 10 years ago  Substance Use Topics  . Alcohol use: No    Alcohol/week: 0.0 standard drinks  . Drug use: No     Family Hx: The patient's family history includes Diabetes in his mother; Heart attack (age of onset: 54) in his brother. There is no history of Colon cancer, Esophageal cancer, Rectal cancer, or Stomach cancer.  ROS:   Please see the history of present illness.    *** All other systems reviewed and are  negative.   Prior CV studies:   The following studies were reviewed today:  ***  Labs/Other Tests and Data Reviewed:    EKG:  {FVC:[id number]}  Recent Labs: 08/14/2017: ALT 14; BUN 14; Creatinine, Ser 0.95; Hemoglobin 10.9; Platelets 321; Potassium 4.3; Sodium 144; TSH 1.090   Recent Lipid Panel Lab Results  Component Value Date/Time   CHOL 147 07/14/2017 09:32 AM   TRIG 137 07/14/2017 09:32 AM   HDL 47 07/14/2017 09:32 AM   CHOLHDL 3.1 07/14/2017 09:32 AM   CHOLHDL 2.0 05/10/2015 08:41 AM   LDLCALC 73 07/14/2017 09:32 AM   LDLDIRECT 91 08/14/2017 04:43 PM   LDLDIRECT 98 07/28/2012 05:01 PM    Wt Readings from Last 3 Encounters:  07/13/18 161 lb (73 kg)  05/31/18 150 lb (68 kg)  05/18/18 150 lb 9.6 oz (68.3 kg)     Objective:    Vital Signs:  There were no vitals taken for this visit.   Well nourished, well developed male in no*** acute distress. ***  ASSESSMENT & PLAN:    1. ***  COVID-19 Education: The signs and symptoms of COVID-19 were discussed with the patient and how to seek care for testing (follow up with PCP or arrange E-visit).  ***The importance of social distancing was discussed today.  Time:   Today, I have spent *** minutes with the patient with telehealth technology discussing the above problems.     Medication Adjustments/Labs and Tests Ordered: Current medicines are reviewed at length with the patient today.  Concerns regarding medicines are outlined above.   Tests Ordered: No orders of the defined types were placed in this encounter.   Medication Changes: No orders of the defined types were placed in this encounter.   Disposition:  Follow up {follow up:15908}  Signed, Charlie Pitter, PA-C  08/03/2018 2:57 PM    [facility]

## 2018-08-05 ENCOUNTER — Ambulatory Visit: Payer: Self-pay | Admitting: Physician Assistant

## 2018-08-05 NOTE — Telephone Encounter (Signed)
Patient called today asking about his appt. I advised him nurse tired to reach him twice. He would like a call back.

## 2018-08-05 NOTE — Telephone Encounter (Signed)
Returned pts call again re: appt for today, 08/05/2018. Pt declined virtual visit.  Put pt on a recall for 3 months. Pt thanked me for the call.

## 2018-08-05 NOTE — Telephone Encounter (Signed)
Returned pts call and left another message for pt to call back. 

## 2018-08-18 ENCOUNTER — Telehealth: Payer: Self-pay | Admitting: Internal Medicine

## 2018-08-18 NOTE — Telephone Encounter (Signed)
Form completed and mailed

## 2018-08-18 NOTE — Telephone Encounter (Signed)
Pt arrived in foyer with patient assistance forms from [employer] for [employer]. Pt filled out his part and needs physician section filled out by MW. Pt states the sooner this is done the better. I informed pt that I would get the papers to MW and we would get this taken care of. Pt states he has no preference as to whether he would want to mail the forms himself or have [place] mail it to the manufacturer. I proceeded to tell pt that we would get this taken care of. Pt verbalized understanding.   Will give papers to [facility] and route this message to MW as an Pharmacist, hospital. Will also keep encounter open to f/u on.

## 2018-08-27 ENCOUNTER — Telehealth: Payer: Self-pay | Admitting: Internal Medicine

## 2018-08-27 NOTE — Telephone Encounter (Signed)
Called [employer] [facility] and spoke with Herbie Baltimore in regards the call we received from [place]. Herbie Baltimore stated there were actually two pieces of info that were left off of pt's application, one being whether pt was a resident in [place] and also MW's license number. I did provide Herbie Baltimore the missing info that they were needing and he has updated the application and stated he would send it over to the pharmacy. Nothing further needed.

## 2018-08-31 ENCOUNTER — Telehealth: Payer: Self-pay

## 2018-08-31 NOTE — Telephone Encounter (Signed)
Called patient to schedule appointment to follow up diabetes. Patient needs labs as of April.  LVM to call office.  [name], [name]

## 2018-09-09 ENCOUNTER — Telehealth: Payer: Self-pay

## 2018-09-09 MED ORDER — MOMETASONE FURO-FORMOTEROL FUM 200-5 MCG/ACT IN AERO: 2.0000 | INHALATION_SPRAY | Freq: Two times a day (BID) | RESPIRATORY_TRACT | 0 refills | Status: DC

## 2018-09-09 NOTE — Telephone Encounter (Signed)
Patient came to the office requesting MW fill out forms for his [facility] patient assistance form Patient also mentioned that he only has 2 weeks left on his current Dulera 200 inhaler 2 samples given to patient and documented per protocol  Patient assistance forms placed on Leslie's desk

## 2018-09-10 NOTE — Telephone Encounter (Signed)
Placed in Dr Gustavus Bryant lookat for sig and I will fill the rest out once he has signed

## 2018-09-15 NOTE — Telephone Encounter (Signed)
Have these forms been completed?  

## 2018-09-15 NOTE — Telephone Encounter (Signed)
Forms have been completed and mailed along with signed rxs for dulera and ventolin  Left detailed msg informing the pt that this was done

## 2019-02-18 ENCOUNTER — Other Ambulatory Visit: Payer: Self-pay

## 2019-02-20 NOTE — Telephone Encounter (Signed)
Please inform patient that he should contact his pulmonologist for this refill. Thank you

## 2019-02-22 NOTE — Telephone Encounter (Signed)
LMOVM informing pt to contact his pulmonologist for his refill. Deseree Kennon Holter, CMA

## 2019-04-04 ENCOUNTER — Encounter: Payer: Self-pay | Admitting: Physician Assistant

## 2019-04-04 NOTE — Progress Notes (Signed)
Cardiology Office Note    Date:  04/05/2019   ID:  [name], DOB Jul 28, 1960, MRN [id number]  PCP:  Danna Hefty, DO  Cardiologist:  Lauree Chandler, MD  Electrophysiologist:  None   Chief Complaint: f/u CAD  History of Present Illness:   [name] is a 58 y.o. male with history of CAD (anterior MI 2008 s/p DES to LAD), ischemic cardiomyopathy with apical hypokinesis, asthma on chronic steroids followed by pulmonology, dental caries, GERD, HLD, thyroid nodule (biopsy negative), DM, leukocytosis and microcytic anemia by last labs who presents for routine overdue follow-up. He has been followed in the past by Dr. Olevia Perches then Dr. Angelena Form. In 2008 he had an anterior MI treated with a DES to the LAD. His ejection fraction was 45% at that time. Myoview 2010 showed no ischemia and showed an ejection fraction of 40%. His last LVEF assessment was by echo in 04/2014 showing mild LVH, EF 35-40%, akinesis of the anteroseptal and apical myocardium with aneurysm formation without LV thrombus, grade 1 DD. Last labs 07/2017 with normal TSH, LDL 91, K 4.3, Cr 0.95, normal LFTs, WBC 15.3, Hgb 10.9, plt 321. He is not on beta blocker due to reactive airway disease.  He is seen back for overdue routine follow-up. His initial appointment earlier this year was delayed due to Covid and he had declined virtual visit at that time, preferring to wait until in-office appointment. Overall he feels stable. This time of year (winter) traditionally proves to be challenging to his asthma as he tends to have increased wheezing. He is on prednisone therapy at this time. He reports home BPs are 0000000 systolic. It sounds like he gets readings above [id number] systolic, lowest 0000000. He has a history of lower BP in 110/68 range when off prednisone but this has been continued as a long term medication for now. He reports NYHA class II dyspnea (i.e. increased SOB if walking 2 flights of stairs without interruption). No  orthopnea, LEE, PND, syncope reported.   Past Medical History:  Diagnosis Date   Asthma    CAD (coronary artery disease)    a. CAD s/p anterior MI in 2008 treated with DES to the LAD.   Cataract 2018   both eyes   Cellulitis of leg, right 10/2014   COPD (chronic obstructive pulmonary disease) ([place])    Dental caries    Diabetes mellitus    TYPE 2   GERD (gastroesophageal reflux disease)    Hyperlipidemia    Hypertension 2017   Ischemic cardiomyopathy    Leukocytosis    noted on labs   Microcytic anemia    Myocardial infarction [facility]) 2008   Reactive airway disease    Thyroid nodule    biopsy negative 2006    Past Surgical History:  Procedure Laterality Date   CORONARY STENT PLACEMENT     ESOPHAGOGASTRODUODENOSCOPY N/A 03/29/2017   Procedure: ESOPHAGOGASTRODUODENOSCOPY (EGD);  Surgeon: Milus Banister, MD;  Location: [facility] ENDOSCOPY;  Service: Endoscopy;  Laterality: N/A;   ESOPHAGOGASTRODUODENOSCOPY N/A 03/31/2018   Procedure: ESOPHAGOGASTRODUODENOSCOPY (EGD);  Surgeon: Doran Stabler, MD;  Location: [place];  Service: Gastroenterology;  Laterality: N/A;   FOREIGN BODY REMOVAL N/A 03/31/2018   Procedure: FOREIGN BODY REMOVAL;  Surgeon: Doran Stabler, MD;  Location: [place];  Service: Gastroenterology;  Laterality: N/A;   solitary pulm and thryoid nodules      Current Medications: Current Meds  Medication Sig   albuterol (PROVENTIL HFA;VENTOLIN HFA) 108 (90 Base)  MCG/ACT inhaler Inhale 2 puffs into the lungs every 4 (four) hours as needed for wheezing or shortness of breath (((PLAN B))).   aspirin EC 81 MG tablet Take 81 mg by mouth daily.   atorvastatin (LIPITOR) 80 MG tablet Take 0.5 tablets (40 mg total) by mouth daily.   calcium-vitamin D (OSCAL WITH D) 250-125 MG-UNIT tablet Take 1 tablet by mouth 2 (two) times daily.   lisinopril (PRINIVIL,ZESTRIL) 20 MG tablet Take 10 mg by mouth daily.    metFORMIN (GLUCOPHAGE) 1000 MG  tablet Take 1 tablet (1,000 mg total) by mouth 2 (two) times daily with a meal.   mometasone-formoterol (DULERA) 200-5 MCG/ACT AERO Inhale 2 puffs into the lungs 2 (two) times daily.   predniSONE (DELTASONE) 10 MG tablet Take 1 tablet (10 mg total) by mouth daily with breakfast.      Allergies:   Patient has no known allergies.   Social History   Socioeconomic History   Marital status: Married    Spouse name: Not on file   Number of children: 3   Years of education: Not on file   Highest education level: Not on file  Occupational History   Not on file  Tobacco Use   Smoking status: Former Smoker    Packs/day: 0.50    Years: 22.00    Pack years: 11.00    Types: Cigarettes    Quit date: 04/21/2006    Years since quitting: 12.9   Smokeless tobacco: Never Used   Tobacco comment: quit 10 years ago  Substance and Sexual Activity   Alcohol use: No    Alcohol/week: 0.0 standard drinks   Drug use: No   Sexual activity: Yes    Birth control/protection: Condom  Other Topics Concern   Not on file  Social History Narrative   Lives with wife; recently moved to [place] from [place] due to health problems. Has no means of affording meds currently + tobacco 1/2ppd x 66yrs, no ETOH no drugs.   Social Determinants of Health   Financial Resource Strain:    Difficulty of Paying Living Expenses: Not on file  Food Insecurity:    Worried About Charity fundraiser in the Last Year: Not on file   [employer] of Food in the Last Year: Not on file  Transportation Needs:    Lack of Transportation (Medical): Not on file   Lack of Transportation (Non-Medical): Not on file  Physical Activity:    Days of Exercise per Week: Not on file   Minutes of Exercise per Session: Not on file  Stress:    Feeling of Stress : Not on file  Social Connections:    Frequency of Communication with Friends and Family: Not on file   Frequency of Social Gatherings with Friends and Family: Not on file    Attends Religious Services: Not on file   Active Member of Clubs or Organizations: Not on file   Attends Archivist Meetings: Not on file   Marital Status: Not on file     Family History:  The patient's family history includes Diabetes in his mother; Heart attack (age of onset: 50) in his brother. There is no history of Colon cancer, Esophageal cancer, Rectal cancer, or Stomach cancer.  ROS:   Please see the history of present illness.  All other systems are reviewed and otherwise negative.    EKGs/Labs/Other Studies Reviewed:    Studies reviewed were summarized above.   EKG:  EKG is ordered today, personally  reviewed, demonstrating NSR 67bpm, prior inferior infarct with TWI II, III, avF, prior anterior infarct with concave ST segment changes in V3-V6 consistent with patient's known LV aneurysm  Recent Labs: No results found for requested labs within last 8760 hours.  Recent Lipid Panel    Component Value Date/Time   CHOL 147 07/14/2017 0932   TRIG 137 07/14/2017 0932   HDL 47 07/14/2017 0932   CHOLHDL 3.1 07/14/2017 0932   CHOLHDL 2.0 05/10/2015 0841   VLDL 19 05/10/2015 0841   LDLCALC 73 07/14/2017 0932   LDLDIRECT 91 08/14/2017 1643   LDLDIRECT 98 07/28/2012 1701    PHYSICAL EXAM:    VS:  BP 138/72    Pulse 67    Ht 5\' 8"  (1.727 m)    Wt 161 lb (73 kg)    SpO2 99%    BMI 24.48 kg/m   BMI: Body mass index is 24.48 kg/m.  GEN: Well nourished, well developed M, in no acute distress HEENT: normocephalic, atraumatic Neck: no JVD, carotid bruits, or masses Cardiac: RRR; no murmurs, rubs, or gallops, no edema  Respiratory:  Faint wheezing in right upper lobe, otherwise moderate air movement and clear to auscultation bilaterally elsewhere with normal work of breathing GI: soft, nontender, nondistended, + BS MS: no deformity or atrophy Skin: warm and dry, no rash Neuro:  Alert and Oriented x 3, Strength and sensation are intact, follows commands Psych:  euthymic mood, full affect  Wt Readings from Last 3 Encounters:  04/05/19 161 lb (73 kg)  07/13/18 161 lb (73 kg)  05/31/18 150 lb (68 kg)     ASSESSMENT & PLAN:   1. CAD - no accelerating anginal symptoms. Continue ASA, statin. Not on beta blocker due to asthma. Check routine labs today to include CBC, CMET, and lipid profile. LDL was suboptimally controlled in 07/2017. If this remains >70, we would likely recommend to increase atorvastatin to 80mg  according to guidelines.  2. Ischemic cardiomyopathy with LV aneurysm - last assessed in 2016. He is NYHA class II. Will repeat study to reassess and exclude any changes or development of LV thrombus. We discussed today potentially adjusting his regimen given his persistently low LV function. We will await labwork. If potassium and creatinine are stable, anticipate transition from lisinopril to [facility] which may help him achieve blood pressure control as well. He is not on BB due to reactive airway disease. Based on his exam I agree with not revisiting at this time. Reviewed sodium restriction, 2L fluid restriction, daily weights with AVS information. 3. Hyperlipidemia - see above. He is fasting. Check CMET and lipids. 4. Abnormal CBC - recheck CBC with labs. Some of his leukocytosis may be steroid related but needs follow-up, especially with microcytic anemia in 07/2017. 5. Essential HTN - BP above goal by clinic value today. Would aim for goal <130/80. Anticipate following with forthcoming transition to [facility] if labs are OK. Get TSH with labs. 6. Diabetes mellitus in nonobese - further management per primary care. Would strongly consider candidacy for SGLT-2 inhibitor given his concomitant cardiac comorbidities. Since he is physically in our office today I will obtain an A1C. That way the information is available to his primary care team in case the pandemic necessitates their follow-up be virtual going forward.  Disposition: I do not have any clinic  in the upcoming weeks so have recommended he follow up with fellow APP for telehealth visit in 3-4 weeks.  Medication Adjustments/Labs and Tests Ordered: Current medicines are reviewed  at length with the patient today.  Concerns regarding medicines are outlined above. Medication changes, Labs and Tests ordered today are summarized above and listed in the Patient Instructions accessible in Encounters.   Signed, Charlie Pitter, PA-C  04/05/2019 10:12 AM    [facility] [address], [place], [place]  [address] Phone: [phone]; Fax: [phone]

## 2019-04-05 ENCOUNTER — Other Ambulatory Visit: Payer: Self-pay

## 2019-04-05 ENCOUNTER — Encounter: Payer: Self-pay | Admitting: Physician Assistant

## 2019-04-05 ENCOUNTER — Ambulatory Visit (INDEPENDENT_AMBULATORY_CARE_PROVIDER_SITE_OTHER): Payer: Self-pay | Admitting: Physician Assistant

## 2019-04-05 VITALS — BP 138/72 | HR 67 | Ht 68.0 in | Wt 161.0 lb

## 2019-04-05 DIAGNOSIS — I10 Essential (primary) hypertension: Secondary | ICD-10-CM

## 2019-04-05 DIAGNOSIS — I255 Ischemic cardiomyopathy: Secondary | ICD-10-CM

## 2019-04-05 DIAGNOSIS — E119 Type 2 diabetes mellitus without complications: Secondary | ICD-10-CM

## 2019-04-05 DIAGNOSIS — I2510 Atherosclerotic heart disease of native coronary artery without angina pectoris: Secondary | ICD-10-CM

## 2019-04-05 DIAGNOSIS — E785 Hyperlipidemia, unspecified: Secondary | ICD-10-CM

## 2019-04-05 DIAGNOSIS — R7989 Other specified abnormal findings of blood chemistry: Secondary | ICD-10-CM

## 2019-04-05 NOTE — Patient Instructions (Addendum)
Medication Instructions:  Your physician recommends that you continue on your current medications as directed. Please refer to the Current Medication list given to you today.  *If you need a refill on your cardiac medications before your next appointment, please call your pharmacy*  Lab Work: TODAY:  CMET, CBC, TSH, LIPID, & A1C  If you have labs (blood work) drawn today and your tests are completely normal, you will receive your results only by: Marland Kitchen MyChart Message (if you have MyChart) OR . A paper copy in the mail If you have any lab test that is abnormal or we need to change your treatment, we will call you to review the results.  Testing/Procedures: Your physician has requested that you have an echocardiogram 04/08/2019 ARRIVE AT 3:30 FOR REGISTRATION. Echocardiography is a painless test that uses sound waves to create images of your heart. It provides your doctor with information about the size and shape of your heart and how well your heart's chambers and valves are working. This procedure takes approximately one hour. There are no restrictions for this procedure.    Follow-Up: At [facility], you and your health needs are our priority.  As part of our continuing mission to provide you with exceptional heart care, we have created designated Provider Care Teams.  These Care Teams include your primary Cardiologist (physician) and Advanced Practice Providers (APPs -  Physician Assistants and Nurse Practitioners) who all work together to provide you with the care you need, when you need it.  Your next appointment:   04/26/2019 9:00   The format for your next appointment:   Virtual Visit   Provider:   Robbie Lis, PA-C  Other Instructions We will wait for your labwork to decide what to do next with your lisinopril to help decide whether we should change to [facility].  For patients with history of weak heart muscles, we give them these special instructions:  1. Follow a low-sodium  diet. Watch your fluid intake. In general, you should not be taking in more than about 64 oz of fluid per day. This includes sources of water in foods like soup, coffee, tea, milk, etc. 2. Weigh yourself on the same scale at same time of day and keep a log. 3. Call your doctor: (Anytime you feel any of the following symptoms)  - 3lb weight gain overnight or 5lb within a few days - Shortness of breath, with or without a dry hacking cough  - Swelling in the hands, feet or stomach  - If you have to sleep on extra pillows at night in order to breathe  IT IS IMPORTANT TO LET YOUR DOCTOR KNOW EARLY ON IF YOU ARE HAVING SYMPTOMS SO WE CAN HELP YOU!   \YOUR CARDIOLOGY TEAM HAS ARRANGED FOR AN E-VISIT FOR YOUR APPOINTMENT - PLEASE REVIEW IMPORTANT INFORMATION BELOW SEVERAL DAYS PRIOR TO YOUR APPOINTMENT  Due to the recent COVID-19 pandemic, we are transitioning in-person office visits to tele-medicine visits in an effort to decrease unnecessary exposure to our patients, their families, and staff. These visits are billed to your insurance just like a normal visit is. We also encourage you to sign up for MyChart if you have not already done so. You will need a smartphone if possible. For patients that do not have this, we can still complete the visit using a regular telephone but do prefer a smartphone to enable video when possible. You may have a family member that lives with you that can help. If possible, we  also ask that you have a blood pressure cuff and scale at home to measure your blood pressure, heart rate and weight prior to your scheduled appointment. Patients with clinical needs that need an in-person evaluation and testing will still be able to come to the office if absolutely necessary. If you have any questions, feel free to call our office.     YOUR PROVIDER WILL BE USING THE FOLLOWING PLATFORM TO COMPLETE YOUR VISIT: Doximity/Doxy.Me   PT GAVE VERBAL PERMISSION FOR VIRTUAL VISIT  . IF USING  DOXIMITY or DOXY.ME - The staff will give you instructions on receiving your link to join the meeting the day of your visit.   THE DAY OF YOUR APPOINTMENT  Approximately 15 minutes prior to your scheduled appointment, you will receive a telephone call from one of [place] team - your caller ID may say "Unknown caller."  Our staff will confirm medications, vital signs for the day and any symptoms you may be experiencing. Please have this information available prior to the time of visit start. It may also be helpful for you to have a pad of paper and pen handy for any instructions given during your visit. They will also walk you through joining the smartphone meeting if this is a video visit.    CONSENT FOR TELE-HEALTH VISIT - PLEASE REVIEW  I hereby voluntarily request, consent and authorize [facility] and its employed or contracted physicians, physician assistants, nurse practitioners or other licensed health care professionals (the Practitioner), to provide me with telemedicine health care services (the "Services") as deemed necessary by the treating Practitioner. I acknowledge and consent to receive the Services by the Practitioner via telemedicine. I understand that the telemedicine visit will involve communicating with the Practitioner through live audiovisual communication technology and the disclosure of certain medical information by electronic transmission. I acknowledge that I have been given the opportunity to request an in-person assessment or other available alternative prior to the telemedicine visit and am voluntarily participating in the telemedicine visit.  I understand that I have the right to withhold or withdraw my consent to the use of telemedicine in the course of my care at any time, without affecting my right to future care or treatment, and that the Practitioner or I may terminate the telemedicine visit at any time. I understand that I have the right to inspect all  information obtained and/or recorded in the course of the telemedicine visit and may receive copies of available information for a reasonable fee.  I understand that some of the potential risks of receiving the Services via telemedicine include:  Marland Kitchen Delay or interruption in medical evaluation due to technological equipment failure or disruption; . Information transmitted may not be sufficient (e.g. poor resolution of images) to allow for appropriate medical decision making by the Practitioner; and/or  . In rare instances, security protocols could fail, causing a breach of personal health information.  Furthermore, I acknowledge that it is my responsibility to provide information about my medical history, conditions and care that is complete and accurate to the best of my ability. I acknowledge that Practitioner's advice, recommendations, and/or decision may be based on factors not within their control, such as incomplete or inaccurate data provided by me or distortions of diagnostic images or specimens that may result from electronic transmissions. I understand that the practice of medicine is not an exact science and that Practitioner makes no warranties or guarantees regarding treatment outcomes. I acknowledge that I will receive a copy of  this consent concurrently upon execution via email to the email address I last provided but may also request a printed copy by calling the office of [facility].    I understand that my insurance will be billed for this visit.   I have read or had this consent read to me. . I understand the contents of this consent, which adequately explains the benefits and risks of the Services being provided via telemedicine.  . I have been provided ample opportunity to ask questions regarding this consent and the Services and have had my questions answered to my satisfaction. . I give my informed consent for the services to be provided through the use of telemedicine in my  medical care  By participating in this telemedicine visit I agree to the above. YOUR CARDIOLOGY TEAM HAS ARRANGED FOR AN E-VISIT FOR YOUR APPOINTMENT - PLEASE REVIEW IMPORTANT INFORMATION BELOW SEVERAL DAYS PRIOR TO YOUR APPOINTMENT  Due to the recent COVID-19 pandemic, we are transitioning in-person office visits to tele-medicine visits in an effort to decrease unnecessary exposure to our patients, their families, and staff. These visits are billed to your insurance just like a normal visit is. We also encourage you to sign up for MyChart if you have not already done so. You will need a smartphone if possible. For patients that do not have this, we can still complete the visit using a regular telephone but do prefer a smartphone to enable video when possible. You may have a family member that lives with you that can help. If possible, we also ask that you have a blood pressure cuff and scale at home to measure your blood pressure, heart rate and weight prior to your scheduled appointment. Patients with clinical needs that need an in-person evaluation and testing will still be able to come to the office if absolutely necessary. If you have any questions, feel free to call our office.    By participating in this telemedicine visit I agree to the above.

## 2019-04-06 ENCOUNTER — Telehealth: Payer: Self-pay | Admitting: *Deleted

## 2019-04-06 DIAGNOSIS — Z79899 Other long term (current) drug therapy: Secondary | ICD-10-CM

## 2019-04-06 LAB — COMPREHENSIVE METABOLIC PANEL
ALT: 22 IU/L (ref 0–44)
AST: 18 IU/L (ref 0–40)
Albumin/Globulin Ratio: 1.4 (ref 1.2–2.2)
Albumin: 3.9 g/dL (ref 3.8–4.9)
Alkaline Phosphatase: 46 IU/L (ref 39–117)
BUN/Creatinine Ratio: 17 (ref 10–24)
BUN: 15 mg/dL (ref 8–27)
Bilirubin Total: 0.2 mg/dL (ref 0.0–1.2)
CO2: 24 mmol/L (ref 20–29)
Calcium: 9.4 mg/dL (ref 8.6–10.2)
Chloride: 101 mmol/L (ref 96–106)
Creatinine, Ser: 0.9 mg/dL (ref 0.76–1.27)
GFR calc Af Amer: 107 mL/min/{1.73_m2} (ref >59)
GFR calc non Af Amer: 93 mL/min/{1.73_m2} (ref >59)
Globulin, Total: 2.7 g/dL (ref 1.5–4.5)
Glucose: 88 mg/dL (ref 65–99)
Potassium: 4.8 mmol/L (ref 3.5–5.2)
Sodium: 140 mmol/L (ref 134–144)
Total Protein: 6.6 g/dL (ref 6.0–8.5)

## 2019-04-06 LAB — CBC
Hematocrit: 33.2 % — ABNORMAL LOW (ref 37.5–51.0)
Hemoglobin: 9.5 g/dL — ABNORMAL LOW (ref 13.0–17.7)
MCH: 18.3 pg — ABNORMAL LOW (ref 26.6–33.0)
MCHC: 28.6 g/dL — ABNORMAL LOW (ref 31.5–35.7)
MCV: 64 fL — ABNORMAL LOW (ref 79–97)
Platelets: 256 10*3/uL (ref 150–450)
RBC: 5.2 x10E6/uL (ref 4.14–5.80)
RDW: 18.7 % — ABNORMAL HIGH (ref 11.6–15.4)
WBC: 12.9 10*3/uL — ABNORMAL HIGH (ref 3.4–10.8)

## 2019-04-06 LAB — LIPID PANEL
Chol/HDL Ratio: 1.7 ratio (ref 0.0–5.0)
Cholesterol, Total: 99 mg/dL — ABNORMAL LOW (ref 100–199)
HDL: 59 mg/dL (ref >39)
LDL Chol Calc (NIH): 25 mg/dL (ref 0–99)
Triglycerides: 72 mg/dL (ref 0–149)
VLDL Cholesterol Cal: 15 mg/dL (ref 5–40)

## 2019-04-06 LAB — HEMOGLOBIN A1C
Est. average glucose Bld gHb Est-mCnc: 151 mg/dL
Hgb A1c MFr Bld: 6.9 % — ABNORMAL HIGH (ref 4.8–5.6)

## 2019-04-06 LAB — TSH: TSH: 1.27 u[IU]/mL (ref 0.450–4.500)

## 2019-04-06 MED ORDER — ENTRESTO 24-26 MG PO TABS: 1.0000 | ORAL_TABLET | Freq: Two times a day (BID) | ORAL | 0 refills | Status: DC

## 2019-04-06 NOTE — Telephone Encounter (Signed)
Pt has been made aware of his lab results.  He will stop Lisinopril and start Entresto 24/26 mg bid. Pt gets it at the [facility] and only pays $5 for prescriptions. Called into there and had to leave a voicemail.

## 2019-04-06 NOTE — Telephone Encounter (Signed)
-----   Message from Charlie Pitter, [place] sent at 04/06/2019  4:21 PM EST ----- Please let patient know labs are normal with exception of the following: his CBC remains pretty abnormal and has been that way in the past. His white blood cell count is elevated and hemoglobin level has been declining for several years. Any evidence of stool/urinary bleeding with red blood or dark stools? Ask him, has he ever had evaluation for his abnormal blood counts? I will cc listed primary care here - I would suggest he schedule a visit with them ASAP to discuss. They may want him to see a hematologist (blood doctor). His cholesterol is at goal so I would not make any changes at this time, OK to refill atorvastatin at present dose (please find out if he still wants to do a 1/2 tablet of an 80mg  or just send in a 40mg  tablet). A1c is 6.9. I discussed his case with Dr. Haroldine Laws, one of our heart failure physicians. Given his longstanding weak heart muscle, he does agree Mr. Lu should be considered to discontinue lisinopril and change to [facility] - the starting dose would be 24/26mg  BID. He needs to delay his first dose of Entresto by at least 36 hours. To make it easy to understand, I usually tell patients that if their last dose of lisinopril was this morning, their first dose of Entresto should NOT be tomorrow morning, but the morning after that. Please see if we have samples or copay card to get started. Would repeat BMET 2 weeks after starting, and keep virtual follow-up with our office 04/26/19 as planned.

## 2019-04-08 ENCOUNTER — Ambulatory Visit (HOSPITAL_COMMUNITY): Payer: Self-pay | Attending: Cardiology

## 2019-04-08 ENCOUNTER — Other Ambulatory Visit: Payer: Self-pay

## 2019-04-08 DIAGNOSIS — I255 Ischemic cardiomyopathy: Secondary | ICD-10-CM

## 2019-04-08 MED ORDER — PERFLUTREN LIPID MICROSPHERE
1.0000 mL | INTRAVENOUS | Status: AC | PRN
  Administered 2019-04-08: 2 mL via INTRAVENOUS

## 2019-04-11 ENCOUNTER — Telehealth: Payer: Self-pay | Admitting: *Deleted

## 2019-04-11 MED ORDER — ENTRESTO 24-26 MG PO TABS: 1.0000 | ORAL_TABLET | Freq: Two times a day (BID) | ORAL | 0 refills | Status: DC

## 2019-04-11 NOTE — Telephone Encounter (Signed)
-----   Message from Charlie Pitter, [place] sent at 04/11/2019  7:57 AM EST ----- Please let patient know that his echocardiogram shows decreased function of his heart. In 04/2014 his EF was 35-40% (normal is 55-60%). His is now 25-30%. There is stiffness of his heart muscle as well. This further supports the plan we discussed about working on his heart failure medication regimen to see if we can improve his heart function. At most recent visit we switched lisinopril to [facility] with plan for close follow-up. We will eventually need to repeat this echocardiogram once we have optimized his medications to see if there is any improvement - if his EF remains 35% or less, we will need to consider a type of heart device called a defibrillator. I will cc to Dr. Angelena Form to find out if he would recommend any new ischemic workup for this - the patient noted stable NYHA class II dyspnea (more wheezing in the winter related to asthma) but no recent anginal symptoms. Thank you.

## 2019-04-11 NOTE — Telephone Encounter (Signed)
Follow Up  Pt called back to receive his echo results.   Please call back

## 2019-04-11 NOTE — Telephone Encounter (Signed)
Called pt re: echocardiogram results, left a message for him to call back.

## 2019-04-11 NOTE — Telephone Encounter (Signed)
-----   Message from Charlie Pitter, [place] sent at 04/11/2019  7:57 AM EST ----- Please let patient know that his echocardiogram shows decreased function of his heart. In 04/2014 his EF was 35-40% (normal is 55-60%). His is now 25-30%. There is stiffness of his heart muscle as well. This further supports the plan we discussed about working on his heart failure medication regimen to see if we can improve his heart function. At most recent visit we switched lisinopril to [facility] with plan for close follow-up. We will eventually need to repeat this echocardiogram once we have optimized his medications to see if there is any improvement - if his EF remains 35% or less, we will need to consider a type of heart device called a defibrillator. I will cc to Dr. Angelena Form to find out if he would recommend any new ischemic workup for this - the patient noted stable NYHA class II dyspnea (more wheezing in the winter related to asthma) but no recent anginal symptoms. Thank you.

## 2019-04-21 ENCOUNTER — Other Ambulatory Visit: Payer: Self-pay

## 2019-04-21 ENCOUNTER — Telehealth: Payer: Self-pay

## 2019-04-21 ENCOUNTER — Other Ambulatory Visit: Payer: Self-pay | Admitting: *Deleted

## 2019-04-21 DIAGNOSIS — Z79899 Other long term (current) drug therapy: Secondary | ICD-10-CM

## 2019-04-21 LAB — BASIC METABOLIC PANEL
BUN/Creatinine Ratio: 15 (ref 10–24)
BUN: 15 mg/dL (ref 8–27)
CO2: 25 mmol/L (ref 20–29)
Calcium: 10 mg/dL (ref 8.6–10.2)
Chloride: 102 mmol/L (ref 96–106)
Creatinine, Ser: 1.02 mg/dL (ref 0.76–1.27)
GFR calc Af Amer: 92 mL/min/{1.73_m2} (ref >59)
GFR calc non Af Amer: 80 mL/min/{1.73_m2} (ref >59)
Glucose: 82 mg/dL (ref 65–99)
Potassium: 4.5 mmol/L (ref 3.5–5.2)
Sodium: 141 mmol/L (ref 134–144)

## 2019-04-21 NOTE — Telephone Encounter (Signed)
The patient has been notified of the lab results and verbalized understanding.  All questions (if any) were answered. Frederik Schmidt, RN 04/21/2019 4:14 PM

## 2019-04-21 NOTE — Telephone Encounter (Signed)
-----   Message from Darreld Mclean, [place] sent at 04/21/2019  4:01 PM EST ----- Please notify patient of results: BMET looks good. Kidney function and electrolytes are normal. Continue with current plan.  Thank you!

## 2019-04-25 NOTE — Progress Notes (Signed)
Virtual Visit via Telephone Note   This visit type was conducted due to national recommendations for restrictions regarding the COVID-19 Pandemic (e.g. social distancing) in an effort to limit this patient's exposure and mitigate transmission in our community.  Due to his co-morbid illnesses, this patient is at least at moderate risk for complications without adequate follow up.  This format is felt to be most appropriate for this patient at this time.  The patient did not have access to video technology/had technical difficulties with video requiring transitioning to audio format only (telephone).  All issues noted in this document were discussed and addressed.  No physical exam could be performed with this format.  Please refer to the patient's chart for his  consent to telehealth for [facility].   Date:  04/26/2019   ID:  [name], DOB 11/09/1960, MRN [id number]  Patient Location: Home Provider Location: Home  PCP:  Danna Hefty, DO  Cardiologist:  Lauree Chandler, MD Electrophysiologist:  None   Evaluation Performed:  Follow-Up Visit  Chief Complaint:  3 weeks follow up  History of Present Illness:    [name] is a 59 y.o. male with hx of CAD (anterior MI 2008 s/p DES to LAD), ischemic cardiomyopathy with apical hypokinesis, asthma on chronic steroids followed by pulmonology, dental caries, GERD, HLD, thyroid nodule (biopsy negative), DM, leukocytosis and microcytic anemia presents for follow up.  Echo in 04/2014 showing mild LVH, EF 35-40%, akinesis of the anteroseptal and apical myocardium with aneurysm formation without LV thrombus, grade 1 DD.   Seen by Melina Copa 04/05/19 for overdue follow up. Not on BB due to asthma. He has SOB with waking 2 flights of stairs. Echo 04/08/2019 showed reduced LVEF to 25-30% (from 35-40% in 2016), akinesis of the anteroseptal wall and apex, grade 1 DD. No apical thrombus. Plan to maximize heart failure therapy. Lisinopril  changed to Entresto.   Seen virtually for follow-up.  He is doing well.  Denies chest pain, shortness of breath, orthopnea, PND or syncope.  No lower extremity edema or melena.  Compliant with low-sodium diet and medication. Discussed echo result and need for maximizing heart failure therapy.   The patient does not have symptoms concerning for COVID-19 infection (fever, chills, cough, or new shortness of breath).    Past Medical History:  Diagnosis Date  . Asthma   . CAD (coronary artery disease)    a. CAD s/p anterior MI in 2008 treated with DES to the LAD.  Marland Kitchen Cataract 2018   both eyes  . Cellulitis of leg, right 10/2014  . COPD (chronic obstructive pulmonary disease) ([place])   . Dental caries   . Diabetes mellitus    TYPE 2  . GERD (gastroesophageal reflux disease)   . Hyperlipidemia   . Hypertension 2017  . Ischemic cardiomyopathy   . Leukocytosis    noted on labs  . Microcytic anemia   . Myocardial infarction ([place]) 2008  . Reactive airway disease   . Thyroid nodule    biopsy negative 2006   Past Surgical History:  Procedure Laterality Date  . CORONARY STENT PLACEMENT    . ESOPHAGOGASTRODUODENOSCOPY N/A 03/29/2017   Procedure: ESOPHAGOGASTRODUODENOSCOPY (EGD);  Surgeon: Milus Banister, MD;  Location: [facility] ENDOSCOPY;  Service: Endoscopy;  Laterality: N/A;  . ESOPHAGOGASTRODUODENOSCOPY N/A 03/31/2018   Procedure: ESOPHAGOGASTRODUODENOSCOPY (EGD);  Surgeon: Doran Stabler, MD;  Location: [place];  Service: Gastroenterology;  Laterality: N/A;  . FOREIGN BODY REMOVAL N/A 03/31/2018  Procedure: FOREIGN BODY REMOVAL;  Surgeon: Doran Stabler, MD;  Location: [place];  Service: Gastroenterology;  Laterality: N/A;  . solitary pulm and thryoid nodules       Current Meds  Medication Sig  . albuterol (PROVENTIL HFA;VENTOLIN HFA) 108 (90 Base) MCG/ACT inhaler Inhale 2 puffs into the lungs every 4 (four) hours as needed for wheezing or shortness of breath (((PLAN B))).   Marland Kitchen aspirin EC 81 MG tablet Take 81 mg by mouth daily.  Marland Kitchen atorvastatin (LIPITOR) 80 MG tablet Take 0.5 tablets (40 mg total) by mouth daily.  . calcium-vitamin D (OSCAL WITH D) 250-125 MG-UNIT tablet Take 1 tablet by mouth 2 (two) times daily.  . metFORMIN (GLUCOPHAGE) 1000 MG tablet Take 1 tablet (1,000 mg total) by mouth 2 (two) times daily with a meal.  . mometasone-formoterol (DULERA) 200-5 MCG/ACT AERO Inhale 2 puffs into the lungs 2 (two) times daily.  . predniSONE (DELTASONE) 10 MG tablet Take 1 tablet (10 mg total) by mouth daily with breakfast.  . sacubitril-valsartan (ENTRESTO) 24-26 MG Take 1 tablet by mouth 2 (two) times daily.     Allergies:   Patient has no known allergies.   Social History   Tobacco Use  . Smoking status: Former Smoker    Packs/day: 0.50    Years: 22.00    Pack years: 11.00    Types: Cigarettes    Quit date: 04/21/2006    Years since quitting: 13.0  . Smokeless tobacco: Never Used  . Tobacco comment: quit 10 years ago  Substance Use Topics  . Alcohol use: No    Alcohol/week: 0.0 standard drinks  . Drug use: No     Family Hx: The patient's family history includes Diabetes in his mother; Heart attack (age of onset: 62) in his brother. There is no history of Colon cancer, Esophageal cancer, Rectal cancer, or Stomach cancer.  ROS:   Please see the history of present illness.    All other systems reviewed and are negative.   Prior CV studies:   The following studies were reviewed today:  Echo 03/2019 1. Left ventricular ejection fraction, by visual estimation, is 25 to 30%. The left ventricle has severely decreased function. There is mildly increased left ventricular hypertrophy.  2. Elevated left atrial pressure.  3. Left ventricular diastolic parameters are consistent with Grade I diastolic dysfunction (impaired relaxation).  4. The left ventricle demonstrates regional wall motion abnormalities.  5. Global right ventricle has normal systolic  function.The right ventricular size is normal.  6. Left atrial size was normal.  7. Right atrial size was normal.  8. The mitral valve is normal in structure. Trivial mitral valve regurgitation. No evidence of mitral stenosis.  9. The tricuspid valve is normal in structure. Tricuspid valve regurgitation is trivial. 10. The aortic valve is tricuspid. Aortic valve regurgitation is not visualized. Mild aortic valve sclerosis without stenosis. 11. The pulmonic valve was normal in structure. Pulmonic valve regurgitation is not visualized. 12. The inferior vena cava is normal in size with greater than 50% respiratory variability, suggesting right atrial pressure of 3 mmHg. 13. Akinesis of the anteroseptal wall and apex; overall severe LV dysfunction; grade 1 diastolic dysfunction; no apical thrombus noted using definity.   Labs/Other Tests and Data Reviewed:    EKG:  No ECG reviewed.  Recent Labs: 04/05/2019: ALT 22; Hemoglobin 9.5; Platelets 256; TSH 1.270 04/21/2019: BUN 15; Creatinine, Ser 1.02; Potassium 4.5; Sodium 141   Recent Lipid Panel Lab Results  Component Value Date/Time   CHOL 99 (L) 04/05/2019 11:01 AM   TRIG 72 04/05/2019 11:01 AM   HDL 59 04/05/2019 11:01 AM   CHOLHDL 1.7 04/05/2019 11:01 AM   CHOLHDL 2.0 05/10/2015 08:41 AM   LDLCALC 25 04/05/2019 11:01 AM   LDLDIRECT 91 08/14/2017 04:43 PM   LDLDIRECT 98 07/28/2012 05:01 PM    Wt Readings from Last 3 Encounters:  04/26/19 160 lb (72.6 kg)  04/05/19 161 lb (73 kg)  07/13/18 161 lb (73 kg)     Objective:    Vital Signs:  BP 137/79   Pulse 82   Ht 5\' 8"  (1.727 m)   Wt 160 lb (72.6 kg)   BMI 24.33 kg/m    VITAL SIGNS:  reviewed GEN:  no acute distress PSYCH:  normal affect  ASSESSMENT & PLAN:    1. CAD s/p DES to LAD No angina. Continue current medications,   2. ICM/Chronic systolic CHF  Echo [id number] showed reduced LVEF to 25-30% (from 35-40% in 2016), akinesis of the anteroseptal wall and apex,  grade 1 DD. No apical thrombus. Maximize heart failure therapy>>Increase Entresto. No BB due to asthma. Repeat BMET in 2 weeks.   3. HLD - 04/05/2019: Cholesterol, Total 99; HDL 59; LDL Chol Calc (NIH) 25; Triglycerides 72  - Continue statin  4. HTN - BP stable    COVID-19 Education: The signs and symptoms of COVID-19 were discussed with the patient and how to seek care for testing (follow up with PCP or arrange E-visit).  The importance of social distancing was discussed today.  Time:   Today, I have spent 6 minutes with the patient with telehealth technology discussing the above problems.     Medication Adjustments/Labs and Tests Ordered: Current medicines are reviewed at length with the patient today.  Concerns regarding medicines are outlined above.   Tests Ordered: No orders of the defined types were placed in this encounter.   Medication Changes: No orders of the defined types were placed in this encounter.   Follow Up:  Virtual Visit  in 4 week(s)  Signed, Leanor Kail, PA  04/26/2019 8:50 AM    [place]

## 2019-04-26 ENCOUNTER — Other Ambulatory Visit: Payer: Self-pay

## 2019-04-26 ENCOUNTER — Encounter: Payer: Self-pay | Admitting: Physician Assistant

## 2019-04-26 ENCOUNTER — Telehealth (INDEPENDENT_AMBULATORY_CARE_PROVIDER_SITE_OTHER): Payer: Self-pay | Admitting: Physician Assistant

## 2019-04-26 VITALS — BP 137/79 | HR 82 | Ht 68.0 in | Wt 160.0 lb

## 2019-04-26 DIAGNOSIS — Z79899 Other long term (current) drug therapy: Secondary | ICD-10-CM

## 2019-04-26 MED ORDER — ENTRESTO 49-51 MG PO TABS: 1.0000 | ORAL_TABLET | Freq: Two times a day (BID) | ORAL | 0 refills | Status: DC

## 2019-04-26 NOTE — Patient Instructions (Signed)
Medication Instructions:  Your physician has recommended you make the following change in your medication:  1.  INCREASE the Entresto to 49/51 twice a day.  You may take 2 of the tablets you have now twice a day until you get your new prescription for the 49/51 dose then you will only take 1 of them twice a day   *If you need a refill on your cardiac medications before your next appointment, please call your pharmacy*  Lab Work: 05/10/2019: BMET  If you have labs (blood work) drawn today and your tests are completely normal, you will receive your results only by: Marland Kitchen MyChart Message (if you have MyChart) OR . A paper copy in the mail If you have any lab test that is abnormal or we need to change your treatment, we will call you to review the results.  Testing/Procedures: None ordered  Follow-Up: At [facility], you and your health needs are our priority.  As part of our continuing mission to provide you with exceptional heart care, we have created designated Provider Care Teams.  These Care Teams include your primary Cardiologist (physician) and Advanced Practice Providers (APPs -  Physician Assistants and Nurse Practitioners) who all work together to provide you with the care you need, when you need it.  Your next appointment:   05/11/2019 3:00   The format for your next appointment:   Virtual Visit   Provider:   Robbie Lis, PA-C  Other Instructions

## 2019-04-27 ENCOUNTER — Telehealth (INDEPENDENT_AMBULATORY_CARE_PROVIDER_SITE_OTHER): Payer: Self-pay | Admitting: Family Medicine

## 2019-04-27 ENCOUNTER — Other Ambulatory Visit: Payer: Self-pay

## 2019-04-27 DIAGNOSIS — E1165 Type 2 diabetes mellitus with hyperglycemia: Secondary | ICD-10-CM

## 2019-04-27 DIAGNOSIS — IMO0002 Reserved for concepts with insufficient information to code with codable children: Secondary | ICD-10-CM

## 2019-04-27 DIAGNOSIS — Z23 Encounter for immunization: Secondary | ICD-10-CM

## 2019-04-27 MED ORDER — METFORMIN HCL 1000 MG PO TABS: 1000.0000 mg | ORAL_TABLET | Freq: Two times a day (BID) | ORAL | 3 refills | Status: DC

## 2019-04-27 NOTE — Assessment & Plan Note (Addendum)
A1C 6.9, improved from 7.2 last year. Doing well on Metformin 1000mg  BID.  Discussed lifestyle modifications including diet and exercise. Patient agreed to work on weight loss going forward. Considered addition of SGLT-2 or GLP-1 to help maximize management, however patient lacks insurance making affordability a barrier. Will continue Metformin at this time Diabetic foot exam performed today Recent CMP in 03/2019 with stable kidney function. On ACE/ARB. Nonsmoker.  RTC in 3-6 months for follow up.

## 2019-04-27 NOTE — Progress Notes (Signed)
[place] Telemedicine Visit  Patient consented to have virtual visit. Method of visit: Video was attempted, but technology challenges prevented patient from using video, so visit was conducted via telephone.  Encounter participants: Patient: [name] - located at Home Provider: Danna Hefty - located at [facility] Others (if applicable): None  Chief Complaint: Diabetes follow up  HPI: Patient here for telemedicine visit today for follow up for diabetes. It has been >1 year since this has been followed up. Most recent A1C was 6.9. He is currently on Metformin 1000mg  BID. He endorses compliance. Denies polyuria, polydipsia, polyphagia.  Weight is stable over the last year. Patient notes not working out but does walk 0.5-1 mile 3-4/day.   BP: 125/81 this AM. Pulse 78.   ROS: per HPI  Pertinent PMHx: T2DM  Exam:  Gen: Well appearing, pleasant older male Respiratory: Speaking in full sentences, unlabored breathing  Diabetic foot Exam: Normal sensation to feet bilaterally Thickened toenails on right foot, normal toe nails on left. No open wounds appreciated.  +2 dorsalis pedis pulses bilaterally  Assessment/Plan:  Uncontrolled type 2 diabetes mellitus, without long-term current use of insulin (HCC) A1C 6.9, improved from 7.2 last year. Doing well on Metformin 1000mg  BID.  Discussed lifestyle modifications including diet and exercise. Patient agreed to work on weight loss going forward. Considered addition of SGLT-2 or GLP-1 to help maximize management, however patient lacks insurance making affordability a barrier. Will continue Metformin at this time Diabetic foot exam performed today Recent CMP in 03/2019 with stable kidney function. On ACE/ARB. Nonsmoker.  RTC in 3-6 months for follow up.   Health Maintenance: Due for Flu exam and foot exam.  Flu vaccine given today. Patient came in for flu exam. Given he was in office, performed diabetic foot  exam.  Time spent during visit with patient: 21 minutes  Mina Marble, [place], PGY2 04/27/19

## 2019-05-10 ENCOUNTER — Other Ambulatory Visit: Payer: Self-pay

## 2019-05-10 DIAGNOSIS — Z79899 Other long term (current) drug therapy: Secondary | ICD-10-CM

## 2019-05-10 LAB — BASIC METABOLIC PANEL
BUN/Creatinine Ratio: 14 (ref 10–24)
BUN: 15 mg/dL (ref 8–27)
CO2: 22 mmol/L (ref 20–29)
Calcium: 10.3 mg/dL — ABNORMAL HIGH (ref 8.6–10.2)
Chloride: 101 mmol/L (ref 96–106)
Creatinine, Ser: 1.11 mg/dL (ref 0.76–1.27)
GFR calc Af Amer: 83 mL/min/{1.73_m2} (ref >59)
GFR calc non Af Amer: 72 mL/min/{1.73_m2} (ref >59)
Glucose: 139 mg/dL — ABNORMAL HIGH (ref 65–99)
Potassium: 5.2 mmol/L (ref 3.5–5.2)
Sodium: 139 mmol/L (ref 134–144)

## 2019-05-11 ENCOUNTER — Telehealth (INDEPENDENT_AMBULATORY_CARE_PROVIDER_SITE_OTHER): Payer: Self-pay | Admitting: Physician Assistant

## 2019-05-11 ENCOUNTER — Telehealth: Payer: Self-pay | Admitting: *Deleted

## 2019-05-11 ENCOUNTER — Encounter: Payer: Self-pay | Admitting: Physician Assistant

## 2019-05-11 VITALS — BP 129/71 | HR 78 | Ht 68.0 in | Wt 161.0 lb

## 2019-05-11 DIAGNOSIS — E785 Hyperlipidemia, unspecified: Secondary | ICD-10-CM

## 2019-05-11 DIAGNOSIS — I255 Ischemic cardiomyopathy: Secondary | ICD-10-CM

## 2019-05-11 DIAGNOSIS — I10 Essential (primary) hypertension: Secondary | ICD-10-CM

## 2019-05-11 DIAGNOSIS — Z79899 Other long term (current) drug therapy: Secondary | ICD-10-CM

## 2019-05-11 DIAGNOSIS — I2510 Atherosclerotic heart disease of native coronary artery without angina pectoris: Secondary | ICD-10-CM

## 2019-05-11 MED ORDER — ENTRESTO 97-103 MG PO TABS: 1.0000 | ORAL_TABLET | Freq: Two times a day (BID) | ORAL | 11 refills | Status: DC

## 2019-05-11 NOTE — Telephone Encounter (Signed)
Pt informed and scheduled for an appt. Kasi Lasky, CMA  

## 2019-05-11 NOTE — Patient Instructions (Signed)
Medication Instructions:  Your physician has recommended you make the following change in your medication:  1.  INCREASE the Entresto to 97-103 taking 1 tablet twice a day   *If you need a refill on your cardiac medications before your next appointment, please call your pharmacy*  Lab Work: 2 WEEKS AFTER STARTING INCREASED DOSE ENTRESTO COME TO THE OFFICE FOR LAB WORK.  ORDER IS THERE   If you have labs (blood work) drawn today and your tests are completely normal, you will receive your results only by: Marland Kitchen MyChart Message (if you have MyChart) OR . A paper copy in the mail If you have any lab test that is abnormal or we need to change your treatment, we will call you to review the results.  Testing/Procedures: None ordered  Follow-Up: At [facility], you and your health needs are our priority.  As part of our continuing mission to provide you with exceptional heart care, we have created designated Provider Care Teams.  These Care Teams include your primary Cardiologist (physician) and Advanced Practice Providers (APPs -  Physician Assistants and Nurse Practitioners) who all work together to provide you with the care you need, when you need it.  Your next appointment:   3 month(s)   08/11/2019 arrive at 8:45 for registration  The format for your next appointment:   In Person  Provider:   Lauree Chandler, MD

## 2019-05-11 NOTE — Telephone Encounter (Signed)
-----   Message from Danna Hefty, [place] sent at 05/09/2019  9:05 PM EST ----- Regarding: Appointment Please have this patient schedule appointment at earliest convenience to be evaluated for abnormal CBC results found incidentally. Thank you!!

## 2019-05-11 NOTE — Progress Notes (Signed)
Virtual Visit via Telephone Note   This visit type was conducted due to national recommendations for restrictions regarding the COVID-19 Pandemic (e.g. social distancing) in an effort to limit this patient's exposure and mitigate transmission in our community.  Due to his co-morbid illnesses, this patient is at least at moderate risk for complications without adequate follow up.  This format is felt to be most appropriate for this patient at this time.  The patient did not have access to video technology/had technical difficulties with video requiring transitioning to audio format only (telephone).  All issues noted in this document were discussed and addressed.  No physical exam could be performed with this format.  Please refer to the patient's chart for his  consent to telehealth for [facility].   Date:  05/11/2019   ID:  [name], DOB May 18, 1960, MRN [id number]  Patient Location: Home Provider Location: Home  PCP:  Danna Hefty, DO  Cardiologist:  Lauree Chandler, MD  Electrophysiologist:  None   Evaluation Performed:  Follow-Up Visit Chief Complaint: medication titration   History of Present Illness:    [name] is a 59 y.o. male with hx of CAD (anterior MI 2008 s/p DES to LAD), ischemic cardiomyopathy with apical hypokinesis, asthmaon chronic steroids followed by pulmonology, dental caries, GERD, HLD, thyroid nodule (biopsy negative), DM, leukocytosis and microcytic anemia presents for follow up.  Echo in 04/2014 showing mild LVH, EF 35-40%, akinesis of the anteroseptal and apical myocardium with aneurysm formation without LV thrombus, grade 1 DD.   Seen by Melina Copa 04/05/19 for overdue follow up. Not on BB due to asthma. He has SOB with waking 2 flights of stairs. Echo 04/08/2019 showed reduced LVEF to 25-30% (from 35-40% in 2016), akinesis of the anteroseptal wall and apex, grade 1 DD. No apical thrombus. Plan to maximize heart failure therapy.  Lisinopril changed to Entresto.   I increase his Entresto to medium dose few weeks ago.  Seen today for follow-up.  Renal function stable.  He denies recurrent chest discomfort.  His breathing improved from baseline.  Denies orthopnea, PND, syncope, lower extremity edema or melena.  Compliant with his medication.  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     The patient does not have symptoms concerning for COVID-19 infection (fever, chills, cough, or new shortness of breath).    Past Medical History:  Diagnosis Date  . Asthma   . CAD (coronary artery disease)    a. CAD s/p anterior MI in 2008 treated with DES to the LAD.  Marland Kitchen Cataract 2018   both eyes  . Cellulitis of leg, right 10/2014  . COPD (chronic obstructive pulmonary disease) ([place])   . Dental caries   . Diabetes mellitus    TYPE 2  . GERD (gastroesophageal reflux disease)   . Hyperlipidemia   . Hypertension 2017  . Ischemic cardiomyopathy   . Leukocytosis    noted on labs  . Microcytic anemia   . Myocardial infarction ([place]) 2008  . Reactive airway disease   . Thyroid nodule    biopsy negative 2006   Past Surgical History:  Procedure Laterality Date  . CORONARY STENT PLACEMENT    . ESOPHAGOGASTRODUODENOSCOPY N/A 03/29/2017   Procedure: ESOPHAGOGASTRODUODENOSCOPY (EGD);  Surgeon: Milus Banister, MD;  Location: [facility] ENDOSCOPY;  Service: Endoscopy;  Laterality: N/A;  . ESOPHAGOGASTRODUODENOSCOPY N/A 03/31/2018   Procedure: ESOPHAGOGASTRODUODENOSCOPY (EGD);  Surgeon: Doran Stabler, MD;  Location: [place];  Service: Gastroenterology;  Laterality: N/A;  .  FOREIGN BODY REMOVAL N/A 03/31/2018   Procedure: FOREIGN BODY REMOVAL;  Surgeon: Doran Stabler, MD;  Location: [place];  Service: Gastroenterology;  Laterality: N/A;  . solitary pulm and thryoid nodules       Current Meds  Medication Sig  . albuterol (PROVENTIL HFA;VENTOLIN HFA) 108 (90 Base) MCG/ACT inhaler Inhale 2 puffs into the lungs every 4  (four) hours as needed for wheezing or shortness of breath (((PLAN B))).  Marland Kitchen aspirin EC 81 MG tablet Take 81 mg by mouth daily.  Marland Kitchen atorvastatin (LIPITOR) 80 MG tablet Take 0.5 tablets (40 mg total) by mouth daily.  . calcium-vitamin D (OSCAL WITH D) 250-125 MG-UNIT tablet Take 1 tablet by mouth 2 (two) times daily.  . metFORMIN (GLUCOPHAGE) 1000 MG tablet Take 1 tablet (1,000 mg total) by mouth 2 (two) times daily with a meal.  . mometasone-formoterol (DULERA) 200-5 MCG/ACT AERO Inhale 2 puffs into the lungs 2 (two) times daily.  . predniSONE (DELTASONE) 10 MG tablet Take 1 tablet (10 mg total) by mouth daily with breakfast.  . [DISCONTINUED] sacubitril-valsartan (ENTRESTO) 49-51 MG Take 1 tablet by mouth 2 (two) times daily.     Allergies:   Patient has no known allergies.   Social History   Tobacco Use  . Smoking status: Former Smoker    Packs/day: 0.50    Years: 22.00    Pack years: 11.00    Types: Cigarettes    Quit date: 04/21/2006    Years since quitting: 13.0  . Smokeless tobacco: Never Used  . Tobacco comment: quit 10 years ago  Substance Use Topics  . Alcohol use: No    Alcohol/week: 0.0 standard drinks  . Drug use: No     Family Hx: The patient's family history includes Diabetes in his mother; Heart attack (age of onset: 62) in his brother. There is no history of Colon cancer, Esophageal cancer, Rectal cancer, or Stomach cancer.  ROS:   Please see the history of present illness.    All other systems reviewed and are negative.   Prior CV studies:   The following studies were reviewed today:  As summarized above  Labs/Other Tests and Data Reviewed:    EKG:  No ECG reviewed.  Recent Labs: 04/05/2019: ALT 22; Hemoglobin 9.5; Platelets 256; TSH 1.270 05/10/2019: BUN 15; Creatinine, Ser 1.11; Potassium 5.2; Sodium 139   Recent Lipid Panel Lab Results  Component Value Date/Time   CHOL 99 (L) 04/05/2019 11:01 AM   TRIG 72 04/05/2019 11:01 AM   HDL 59 04/05/2019  11:01 AM   CHOLHDL 1.7 04/05/2019 11:01 AM   CHOLHDL 2.0 05/10/2015 08:41 AM   LDLCALC 25 04/05/2019 11:01 AM   LDLDIRECT 91 08/14/2017 04:43 PM   LDLDIRECT 98 07/28/2012 05:01 PM    Wt Readings from Last 3 Encounters:  05/11/19 161 lb (73 kg)  04/26/19 160 lb (72.6 kg)  04/05/19 161 lb (73 kg)     Objective:    Vital Signs:  BP 129/71   Pulse 78   Ht 5\' 8"  (1.727 m)   Wt 161 lb (73 kg)   BMI 24.48 kg/m    VITAL SIGNS:  reviewed GEN:  no acute distress PSYCH:  normal affect  ASSESSMENT & PLAN:    1. CAD s/p DES to LAD No angina.  No change in therapy.  2. ICM/Chronic systolic CHF  Echo A999333 showed reduced LVEF to 25-30% (from 35-40% in 2016), akinesis of the anteroseptal wall and apex, grade 1  DD. No apical thrombus. Maximize heart failure therapy>> No BB due to asthma.   Will uptitrate Entresto to maximum dose, prescription sent.  He will start taking higher dose once approved from assistant.  He will get bmet after starting higher dose.  Consider repeat echo at follow-up plan to reassess LV function.   3. HLD - 04/05/2019: Cholesterol, Total 99; HDL 59; LDL Chol Calc (NIH) 25; Triglycerides 72  - Continue statin  4. HTN - BP stable  COVID-19 Education: The signs and symptoms of COVID-19 were discussed with the patient and how to seek care for testing (follow up with PCP or arrange E-visit).  The importance of social distancing was discussed today.  Time:   Today, I have spent 6 minutes with the patient with telehealth technology discussing the above problems.     Medication Adjustments/Labs and Tests Ordered: Current medicines are reviewed at length with the patient today.  Concerns regarding medicines are outlined above.   Tests Ordered: Orders Placed This Encounter  Procedures  . Basic metabolic panel    Medication Changes: Meds ordered this encounter  Medications  . sacubitril-valsartan (ENTRESTO) 97-103 MG    Sig: Take 1 tablet by mouth 2  (two) times daily.    Dispense:  60 tablet    Refill:  11    Follow Up:  Either In Person or Virtual in 3 month(s)  Signed, Leanor Kail, [place]  05/11/2019 9:27 AM    [facility]

## 2019-05-16 ENCOUNTER — Other Ambulatory Visit: Payer: Self-pay | Admitting: *Deleted

## 2019-05-18 ENCOUNTER — Other Ambulatory Visit: Payer: Self-pay

## 2019-05-18 ENCOUNTER — Encounter: Payer: Self-pay | Admitting: Family Medicine

## 2019-05-18 ENCOUNTER — Ambulatory Visit (INDEPENDENT_AMBULATORY_CARE_PROVIDER_SITE_OTHER): Payer: Self-pay | Admitting: Family Medicine

## 2019-05-18 VITALS — BP 125/70 | HR 90 | Wt 164.2 lb

## 2019-05-18 DIAGNOSIS — D509 Iron deficiency anemia, unspecified: Secondary | ICD-10-CM | POA: Insufficient documentation

## 2019-05-18 DIAGNOSIS — IMO0002 Reserved for concepts with insufficient information to code with codable children: Secondary | ICD-10-CM

## 2019-05-18 DIAGNOSIS — E1165 Type 2 diabetes mellitus with hyperglycemia: Secondary | ICD-10-CM

## 2019-05-18 MED ORDER — ATORVASTATIN CALCIUM 80 MG PO TABS: 40.0000 mg | ORAL_TABLET | Freq: Every day | ORAL | 3 refills | Status: DC

## 2019-05-18 MED ORDER — METFORMIN HCL 1000 MG PO TABS: 1000.0000 mg | ORAL_TABLET | Freq: Two times a day (BID) | ORAL | 3 refills | Status: DC

## 2019-05-18 MED ORDER — PREDNISONE 10 MG PO TABS: 10.0000 mg | ORAL_TABLET | Freq: Every day | ORAL | 1 refills | Status: DC

## 2019-05-18 NOTE — Assessment & Plan Note (Addendum)
Chronic and worsening. Asymptomatic. Unclear of cause. No red flags or B symptoms in history or physical exam. Colonoscopy in Feb 2020 WNL with few tubular adenomas. Differential at this time includes iron deficiency anemia with undetermined etiology vs thalassemia. Less likely hemolytic anemia given normal CMP. Will obtain labs to further evaluate. Will discuss plan pending results. If appears to be 2/2 to iron deficiency anemia will likely need referral to GI for endoscopy for further evaluation.  - CBC with diff, iron panel with retic count, Hgb fraction with solubility  - follow up pending lab results

## 2019-05-18 NOTE — Addendum Note (Signed)
Addended by: Danna Hefty on: 05/18/2019 04:23 PM   Modules accepted: Orders

## 2019-05-18 NOTE — Progress Notes (Addendum)
   Subjective:   Patient ID: [name]    DOB: 05/26/1960, 59 y.o. male   MRN: [id number]  [name] is a 59 y.o. male with a history of atherosclerosis and MI, uncontrolled T2DM, COPD and severe asthma on long term prednisone, HTN here for further evaluation of abnormal CBC.  Abnormal CBC: Patient presents today after incidental finding of abnormal CBC. His last CBC is notable for elevated WBC that appears to be chronic, Anemia of 9.5 with MCV of 64. Platelets WNL. Patient denies any abnormal symptoms. Denies bone pain, night sweats, unexplained weights. Denies any significant family history such as cancers or blood disorder. Notes only diabetes and HTN. He nCotes some dizziness when he stands up quickly but denies any other lightheadedness or dizziness when working or walking. Denies any SOB. Notes normal brown stools with occasional constipation. He notes they are most of the time normal caliper, does have occasional thin stools . Had colonoscopy in 05/2018 that found three 2-60mm polyps that were found to be tubular adenomas. He has been on prednisone chronically for severe asthma.   Review of Systems:  Per HPI.   [place], medications and smoking status reviewed.  Objective:   BP 125/70   Pulse 90   Wt 164 lb 3.2 oz (74.5 kg)   SpO2 98%   BMI 24.97 kg/m  Vitals and nursing note reviewed.  General: Pleasant older male, well nourished, well developed, in no acute distress with non-toxic appearance, sitting comfortably in exam chair Neck: supple, non-tender without lymphadenopathy CV: regular rate and rhythm without murmurs, rubs, or gallops, 2+ radial pulses bilaterally Lungs: clear to auscultation bilaterally with normal work of breathing Abdomen: soft, non-tender, non-distended, normoactive bowel sounds Skin: warm, dry Extremities: warm and well perfused MSK: gait normal Neuro: Alert and oriented, speech normal  Assessment & Plan:   Microcytic anemia Chronic and  worsening. Asymptomatic. Unclear of cause. No red flags or B symptoms in history or physical exam. Colonoscopy in Feb 2020 WNL with few tubular adenomas. Differential at this time includes iron deficiency anemia with undetermined etiology vs thalassemia. Less likely hemolytic anemia given normal CMP. Will obtain labs to further evaluate. Will discuss plan pending results. If appears to be 2/2 to iron deficiency anemia will likely need referral to GI for endoscopy for further evaluation.  - CBC with diff, iron panel with retic count, Hgb fraction with solubility  - follow up pending lab results  Uncontrolled Diabetes: Metformin and Atorvastatin refills provided per patient request.  Orders Placed This Encounter  Procedures  . CBC with Differential  . Iron, TIBC and Ferritin Panel  . Reticulocytes  . HGB FRAC. W/SOLUBILITY    Mina Marble, DO PGY-2, [facility] 05/18/2019 4:20 PM

## 2019-05-18 NOTE — Patient Instructions (Signed)
Thank you so much for coming in to see me today! I have collected some blood work to further evaluate your pervious blood work. I will call you with the results and the further plan.  Just so you are aware. The reason we are looking into your labs is because your white blood cells (immune cells) were a little high and your hemoglobin is really low (anemia). For a male, this is usually abnormal and worth investigating.

## 2019-05-18 NOTE — Addendum Note (Signed)
Addended by: Danna Hefty on: 05/18/2019 04:26 PM   Modules accepted: Orders

## 2019-05-23 ENCOUNTER — Other Ambulatory Visit: Payer: Self-pay | Admitting: Family Medicine

## 2019-05-23 DIAGNOSIS — D509 Iron deficiency anemia, unspecified: Secondary | ICD-10-CM

## 2019-05-23 LAB — CBC WITH DIFFERENTIAL/PLATELET
Basophils Absolute: 0.1 10*3/uL (ref 0.0–0.2)
Basos: 1 %
EOS (ABSOLUTE): 0.6 10*3/uL — ABNORMAL HIGH (ref 0.0–0.4)
Eos: 5 %
Hematocrit: 34.9 % — ABNORMAL LOW (ref 37.5–51.0)
Hemoglobin: 9.9 g/dL — ABNORMAL LOW (ref 13.0–17.7)
Immature Grans (Abs): 0 10*3/uL (ref 0.0–0.1)
Immature Granulocytes: 0 %
Lymphocytes Absolute: 3.1 10*3/uL (ref 0.7–3.1)
Lymphs: 28 %
MCH: 18.3 pg — ABNORMAL LOW (ref 26.6–33.0)
MCHC: 28.4 g/dL — ABNORMAL LOW (ref 31.5–35.7)
MCV: 65 fL — ABNORMAL LOW (ref 79–97)
Monocytes Absolute: 1 10*3/uL — ABNORMAL HIGH (ref 0.1–0.9)
Monocytes: 9 %
Neutrophils Absolute: 6.2 10*3/uL (ref 1.4–7.0)
Neutrophils: 57 %
Platelets: 287 10*3/uL (ref 150–450)
RBC: 5.41 x10E6/uL (ref 4.14–5.80)
RDW: 19 % — ABNORMAL HIGH (ref 11.6–15.4)
WBC: 11 10*3/uL — ABNORMAL HIGH (ref 3.4–10.8)

## 2019-05-23 LAB — HGB FRAC. W/SOLUBILITY
Hgb A: 61.6 % — ABNORMAL LOW (ref 96.4–98.8)
Hgb C: 0 %
Hgb F Quant: 0 % (ref 0.0–2.0)
Hgb S: 0 %
Hgb Solubility: NEGATIVE
Hgb Variant: 38.4 % — ABNORMAL HIGH

## 2019-05-23 LAB — IRON,TIBC AND FERRITIN PANEL
Ferritin: 11 ng/mL — ABNORMAL LOW (ref 30–400)
Iron Saturation: 7 % — CL (ref 15–55)
Iron: 30 ug/dL — ABNORMAL LOW (ref 38–169)
Total Iron Binding Capacity: 440 ug/dL (ref 250–450)
UIBC: 410 ug/dL — ABNORMAL HIGH (ref 111–343)

## 2019-05-23 LAB — RETICULOCYTES: Retic Ct Pct: 1.5 % (ref 0.6–2.6)

## 2019-05-23 NOTE — Progress Notes (Signed)
Attempted to contact patient to discuss lab results without success. LMoVM for patient to call back. Patient has significant iron deficiency. He will need to start taking daily iron supplement 325mg  ASAP. He can pick this up at his local pharmacy. He may take Miralax daily to help with the constipation side effects. I have also placed a GI referral for further evaluation for cause of his iron deficiency. He should contact our clinic if he doesn't hear from anyone in the next 2 weeks.

## 2019-05-31 ENCOUNTER — Other Ambulatory Visit: Payer: Self-pay

## 2019-05-31 ENCOUNTER — Encounter (HOSPITAL_COMMUNITY): Payer: Self-pay | Admitting: Emergency Medicine

## 2019-05-31 ENCOUNTER — Emergency Department (HOSPITAL_COMMUNITY)
Admission: EM | Admit: 2019-05-31 | Discharge: 2019-05-31 | Disposition: A | Discharge status: Home / Self Care | Payer: Self-pay | Attending: Emergency Medicine | Admitting: Emergency Medicine

## 2019-05-31 ENCOUNTER — Emergency Department (HOSPITAL_COMMUNITY): Payer: Self-pay

## 2019-05-31 DIAGNOSIS — Z7984 Long term (current) use of oral hypoglycemic drugs: Secondary | ICD-10-CM | POA: Insufficient documentation

## 2019-05-31 DIAGNOSIS — Y990 Civilian activity done for income or pay: Secondary | ICD-10-CM | POA: Insufficient documentation

## 2019-05-31 DIAGNOSIS — Z87891 Personal history of nicotine dependence: Secondary | ICD-10-CM | POA: Insufficient documentation

## 2019-05-31 DIAGNOSIS — Z7982 Long term (current) use of aspirin: Secondary | ICD-10-CM | POA: Insufficient documentation

## 2019-05-31 DIAGNOSIS — I10 Essential (primary) hypertension: Secondary | ICD-10-CM | POA: Insufficient documentation

## 2019-05-31 DIAGNOSIS — X500XXA Overexertion from strenuous movement or load, initial encounter: Secondary | ICD-10-CM | POA: Insufficient documentation

## 2019-05-31 DIAGNOSIS — S46912A Strain of unspecified muscle, fascia and tendon at shoulder and upper arm level, left arm, initial encounter: Secondary | ICD-10-CM | POA: Insufficient documentation

## 2019-05-31 DIAGNOSIS — J449 Chronic obstructive pulmonary disease, unspecified: Secondary | ICD-10-CM | POA: Insufficient documentation

## 2019-05-31 DIAGNOSIS — E119 Type 2 diabetes mellitus without complications: Secondary | ICD-10-CM | POA: Insufficient documentation

## 2019-05-31 DIAGNOSIS — I252 Old myocardial infarction: Secondary | ICD-10-CM | POA: Insufficient documentation

## 2019-05-31 DIAGNOSIS — I2510 Atherosclerotic heart disease of native coronary artery without angina pectoris: Secondary | ICD-10-CM | POA: Insufficient documentation

## 2019-05-31 DIAGNOSIS — Y9389 Activity, other specified: Secondary | ICD-10-CM | POA: Insufficient documentation

## 2019-05-31 DIAGNOSIS — Y9289 Other specified places as the place of occurrence of the external cause: Secondary | ICD-10-CM | POA: Insufficient documentation

## 2019-05-31 MED ORDER — IBUPROFEN 800 MG PO TABS
800.0000 mg | ORAL_TABLET | Freq: Once | ORAL | Status: AC
  Administered 2019-05-31: 800 mg via ORAL
  Filled 2019-05-31: qty 1

## 2019-05-31 MED ORDER — IBUPROFEN 800 MG PO TABS: 800.0000 mg | ORAL_TABLET | Freq: Three times a day (TID) | ORAL | 0 refills | Status: DC | PRN

## 2019-05-31 NOTE — ED Triage Notes (Signed)
Pt states he lifted a gallon of water with his left arm- felt it pull in his arm and has had arm pain ever since. Pt states he cannot lift his left arm above 90 degrees.

## 2019-05-31 NOTE — Discharge Instructions (Signed)
Return here as needed.  Follow-up with the orthopedist provided for reevaluation. ice and heat on the shoulder.

## 2019-06-08 ENCOUNTER — Encounter: Payer: Self-pay | Admitting: Family Medicine

## 2019-06-10 NOTE — ED Provider Notes (Signed)
[facility] EMERGENCY DEPARTMENT Provider Note   CSN: [id number] Arrival date & time: 05/31/19  1151     History Chief Complaint  Patient presents with  . Arm Pain    [name] is a 59 y.o. male.  HPI Patient presents to the emergency department with shoulder discomfort that radiates down from his neck.  The patient states that he was lifting at work when this started.  Patient states that nothing seems to make the condition better with certain movements palpation makes the pain worse.  Patient states that the main pain is in the shoulder region.  The patient denies chest pain, shortness of breath, headache,blurred vision, neck pain, fever, cough, weakness, numbness, dizziness, anorexia, edema, abdominal pain, nausea, vomiting, diarrhea, rash, back pain, dysuria, hematemesis, bloody stool, near syncope, or syncope.    Past Medical History:  Diagnosis Date  . Asthma   . CAD (coronary artery disease)    a. CAD s/p anterior MI in 2008 treated with DES to the LAD.  Marland Kitchen Cataract 2018   both eyes  . Cellulitis of leg, right 10/2014  . COPD (chronic obstructive pulmonary disease) ([place])   . Dental caries   . Diabetes mellitus    TYPE 2  . GERD (gastroesophageal reflux disease)   . Hyperlipidemia   . Hypertension 2017  . Ischemic cardiomyopathy   . Leukocytosis    noted on labs  . Microcytic anemia   . Myocardial infarction ([place]) 2008  . Reactive airway disease   . Thyroid nodule    biopsy negative 2006    Patient Active Problem List   Diagnosis Date Noted  . Microcytic anemia 05/18/2019  . GERD (gastroesophageal reflux disease) 10/26/2017  . Primary hypertension 04/22/2017  . COPD with asthma ([place]) 12/18/2014  . Cataracts, bilateral 06/27/2014  . Cardiomyopathy, ischemic 11/11/2011  . Uncontrolled type 2 diabetes mellitus, without long-term current use of insulin ([place]) 06/12/2009  . Atherosclerosis of native coronary artery of native heart 07/13/2008   . History of MI (myocardial infarction) 04/01/2007  . Sinusitis, chronic 06/18/2006    Past Surgical History:  Procedure Laterality Date  . CORONARY STENT PLACEMENT    . ESOPHAGOGASTRODUODENOSCOPY N/A 03/29/2017   Procedure: ESOPHAGOGASTRODUODENOSCOPY (EGD);  Surgeon: Milus Banister, MD;  Location[facility];  Service: Endoscopy;  Laterality: N/A;  . ESOPHAGOGASTRODUODENOSCOPY N/A 03/31/2018   Procedure: ESOPHAGOGASTRODUODENOSCOPY (EGD);  Surgeon: Doran Stabler, MD;  Location: [facility];  Service: Gastroenterology;  Laterality: N/A;  . FOREIGN BODY REMOVAL N/A 03/31/2018   Procedure: FOREIGN BODY REMOVAL;  Surgeon: Doran Stabler, MD;  Location: [facility];  Service: Gastroenterology;  Laterality: N/A;  . solitary pulm and thryoid nodules         Family History  Problem Relation Age of Onset  . Diabetes Mother   . Heart attack Brother 4  . Colon cancer Neg Hx   . Esophageal cancer Neg Hx   . Rectal cancer Neg Hx   . Stomach cancer Neg Hx     Social History   Tobacco Use  . Smoking status: Former Smoker    Packs/day: 0.50    Years: 22.00    Pack years: 11.00    Types: Cigarettes    Quit date: 04/21/2006    Years since quitting: 13.1  . Smokeless tobacco: Never Used  . Tobacco comment: quit 10 years ago  Substance Use Topics  . Alcohol use: No    Alcohol/week: 0.0 standard drinks  . Drug  use: No    Home Medications Prior to Admission medications   Medication Sig Start Date End Date Taking? Authorizing Provider  albuterol (PROVENTIL HFA;VENTOLIN HFA) 108 (90 Base) MCG/ACT inhaler Inhale 2 puffs into the lungs every 4 (four) hours as needed for wheezing or shortness of breath (((PLAN B))). 07/13/18   Tanda Rockers, MD  aspirin EC 81 MG tablet Take 81 mg by mouth daily.    [provider]  atorvastatin (LIPITOR) 80 MG tablet Take 0.5 tablets (40 mg total) by mouth daily. 05/18/19   Mullis, Kiersten P, DO  calcium-vitamin D (OSCAL WITH D)  250-125 MG-UNIT tablet Take 1 tablet by mouth 2 (two) times daily.    [provider]  ibuprofen (ADVIL) 800 MG tablet Take 1 tablet (800 mg total) by mouth every 8 (eight) hours as needed. 05/31/19   Melven Stockard, Harrell Gave, PA-C  metFORMIN (GLUCOPHAGE) 1000 MG tablet Take 1 tablet (1,000 mg total) by mouth 2 (two) times daily with a meal. 05/18/19   Mullis, Kiersten P, DO  mometasone-formoterol (DULERA) 200-5 MCG/ACT AERO Inhale 2 puffs into the lungs 2 (two) times daily. 07/13/18   Tanda Rockers, MD  predniSONE (DELTASONE) 10 MG tablet Take 1 tablet (10 mg total) by mouth daily with breakfast. 05/18/19   Mullis, Kiersten P, DO  sacubitril-valsartan (ENTRESTO) 97-103 MG Take 1 tablet by mouth 2 (two) times daily. 05/11/19   Leanor Kail, PA    Allergies    Patient has no known allergies.  Review of Systems   Review of Systems All other systems negative except as documented in the HPI. All pertinent positives and negatives as reviewed in the HPI. Physical Exam Updated Vital Signs BP 140/87 (BP Location: Right Arm)   Pulse 79   Temp 98.4 F (36.9 C) (Oral)   Resp 16   SpO2 98%   Physical Exam Vitals and nursing note reviewed.  Constitutional:      General: He is not in acute distress.    Appearance: He is well-developed.  HENT:     Head: Normocephalic and atraumatic.  Eyes:     Pupils: Pupils are equal, round, and reactive to light.  Pulmonary:     Effort: Pulmonary effort is normal.  Musculoskeletal:     Left shoulder: Tenderness present. No bony tenderness. Decreased range of motion.  Skin:    General: Skin is warm and dry.  Neurological:     Mental Status: He is alert and oriented to person, place, and time.     ED Results / Procedures / Treatments   Labs (all labs ordered are listed, but only abnormal results are displayed) Labs Reviewed - No data to display  EKG None  Radiology No results found.  Procedures Procedures (including critical care  time)  Medications Ordered in ED Medications  ibuprofen (ADVIL) tablet 800 mg (800 mg Oral Given 05/31/19 1441)    ED Course  I have reviewed the triage vital signs and the nursing notes.  Pertinent labs & imaging results that were available during my care of the patient were reviewed by me and considered in my medical decision making (see chart for details).    MDM Rules/Calculators/A&P                     Patient be treated for shoulder and trapezius strain.  Advised patient to follow-up with his doctor.  Told to follow-up with the orthopedist as needed.  Patient agrees with plan and all questions were  answered.  Patient did not have any chest pain or shortness of breath at all associated with the symptoms.  Final Clinical Impression(s) / ED Diagnoses Final diagnoses:  Shoulder strain, left, initial encounter    Rx / DC Orders ED Discharge Orders         Ordered    ibuprofen (ADVIL) 800 MG tablet  Every 8 hours PRN     05/31/19 1350           Dalia Heading, PA-C 06/10/19 2254    Hayden Rasmussen, MD 06/11/19 [phone]

## 2019-06-30 ENCOUNTER — Encounter (HOSPITAL_COMMUNITY): Payer: Self-pay | Admitting: Emergency Medicine

## 2019-06-30 ENCOUNTER — Ambulatory Visit (HOSPITAL_COMMUNITY)
Admission: EM | Admit: 2019-06-30 | Discharge: 2019-06-30 | Disposition: A | Discharge status: Home / Self Care | Payer: Self-pay | Attending: Internal Medicine | Admitting: Internal Medicine

## 2019-06-30 ENCOUNTER — Other Ambulatory Visit: Payer: Self-pay

## 2019-06-30 ENCOUNTER — Telehealth (HOSPITAL_COMMUNITY): Payer: Self-pay

## 2019-06-30 DIAGNOSIS — K219 Gastro-esophageal reflux disease without esophagitis: Secondary | ICD-10-CM | POA: Insufficient documentation

## 2019-06-30 DIAGNOSIS — Z79899 Other long term (current) drug therapy: Secondary | ICD-10-CM | POA: Insufficient documentation

## 2019-06-30 DIAGNOSIS — Z87891 Personal history of nicotine dependence: Secondary | ICD-10-CM | POA: Insufficient documentation

## 2019-06-30 DIAGNOSIS — Z8249 Family history of ischemic heart disease and other diseases of the circulatory system: Secondary | ICD-10-CM | POA: Insufficient documentation

## 2019-06-30 DIAGNOSIS — J45909 Unspecified asthma, uncomplicated: Secondary | ICD-10-CM | POA: Insufficient documentation

## 2019-06-30 DIAGNOSIS — J449 Chronic obstructive pulmonary disease, unspecified: Secondary | ICD-10-CM | POA: Insufficient documentation

## 2019-06-30 DIAGNOSIS — I255 Ischemic cardiomyopathy: Secondary | ICD-10-CM | POA: Insufficient documentation

## 2019-06-30 DIAGNOSIS — Z833 Family history of diabetes mellitus: Secondary | ICD-10-CM | POA: Insufficient documentation

## 2019-06-30 DIAGNOSIS — R5383 Other fatigue: Secondary | ICD-10-CM | POA: Insufficient documentation

## 2019-06-30 DIAGNOSIS — Z7984 Long term (current) use of oral hypoglycemic drugs: Secondary | ICD-10-CM | POA: Insufficient documentation

## 2019-06-30 DIAGNOSIS — E119 Type 2 diabetes mellitus without complications: Secondary | ICD-10-CM | POA: Insufficient documentation

## 2019-06-30 DIAGNOSIS — Z7982 Long term (current) use of aspirin: Secondary | ICD-10-CM | POA: Insufficient documentation

## 2019-06-30 DIAGNOSIS — I10 Essential (primary) hypertension: Secondary | ICD-10-CM | POA: Insufficient documentation

## 2019-06-30 DIAGNOSIS — D509 Iron deficiency anemia, unspecified: Secondary | ICD-10-CM | POA: Insufficient documentation

## 2019-06-30 DIAGNOSIS — Z7951 Long term (current) use of inhaled steroids: Secondary | ICD-10-CM | POA: Insufficient documentation

## 2019-06-30 DIAGNOSIS — Z7952 Long term (current) use of systemic steroids: Secondary | ICD-10-CM | POA: Insufficient documentation

## 2019-06-30 DIAGNOSIS — Z20822 Contact with and (suspected) exposure to covid-19: Secondary | ICD-10-CM | POA: Insufficient documentation

## 2019-06-30 DIAGNOSIS — I252 Old myocardial infarction: Secondary | ICD-10-CM | POA: Insufficient documentation

## 2019-06-30 DIAGNOSIS — Z955 Presence of coronary angioplasty implant and graft: Secondary | ICD-10-CM | POA: Insufficient documentation

## 2019-06-30 DIAGNOSIS — I2510 Atherosclerotic heart disease of native coronary artery without angina pectoris: Secondary | ICD-10-CM | POA: Insufficient documentation

## 2019-06-30 DIAGNOSIS — E785 Hyperlipidemia, unspecified: Secondary | ICD-10-CM | POA: Insufficient documentation

## 2019-06-30 LAB — CBC WITH DIFFERENTIAL/PLATELET
Abs Immature Granulocytes: 0.07 10*3/uL (ref 0.00–0.07)
Basophils Absolute: 0.2 10*3/uL — ABNORMAL HIGH (ref 0.0–0.1)
Basophils Relative: 2 %
Eosinophils Absolute: 1.4 10*3/uL — ABNORMAL HIGH (ref 0.0–0.5)
Eosinophils Relative: 10 %
HCT: 37.6 % — ABNORMAL LOW (ref 39.0–52.0)
Hemoglobin: 10.8 g/dL — ABNORMAL LOW (ref 13.0–17.0)
Immature Granulocytes: 1 %
Lymphocytes Relative: 17 %
Lymphs Abs: 2.4 10*3/uL (ref 0.7–4.0)
MCH: 18.3 pg — ABNORMAL LOW (ref 26.0–34.0)
MCHC: 28.7 g/dL — ABNORMAL LOW (ref 30.0–36.0)
MCV: 63.6 fL — ABNORMAL LOW (ref 80.0–100.0)
Monocytes Absolute: 1 10*3/uL (ref 0.1–1.0)
Monocytes Relative: 7 %
Neutro Abs: 9 10*3/uL — ABNORMAL HIGH (ref 1.7–7.7)
Neutrophils Relative %: 63 %
Platelets: 317 10*3/uL (ref 150–400)
RBC: 5.91 MIL/uL — ABNORMAL HIGH (ref 4.22–5.81)
RDW: 19.6 % — ABNORMAL HIGH (ref 11.5–15.5)
WBC: 14.1 10*3/uL — ABNORMAL HIGH (ref 4.0–10.5)
nRBC: 0 % (ref 0.0–0.2)

## 2019-06-30 LAB — TSH: TSH: 1.165 u[IU]/mL (ref 0.350–4.500)

## 2019-06-30 LAB — COMPREHENSIVE METABOLIC PANEL
ALT: 26 U/L (ref 0–44)
AST: 29 U/L (ref 15–41)
Albumin: 4.3 g/dL (ref 3.5–5.0)
Alkaline Phosphatase: 36 U/L — ABNORMAL LOW (ref 38–126)
Anion gap: 10 (ref 5–15)
BUN: 14 mg/dL (ref 6–20)
CO2: 26 mmol/L (ref 22–32)
Calcium: 10 mg/dL (ref 8.9–10.3)
Chloride: 104 mmol/L (ref 98–111)
Creatinine, Ser: 1.13 mg/dL (ref 0.61–1.24)
GFR calc Af Amer: >60 mL/min (ref >60)
GFR calc non Af Amer: >60 mL/min (ref >60)
Glucose, Bld: 120 mg/dL — ABNORMAL HIGH (ref 70–99)
Potassium: 4.4 mmol/L (ref 3.5–5.1)
Sodium: 140 mmol/L (ref 135–145)
Total Bilirubin: 0.7 mg/dL (ref 0.3–1.2)
Total Protein: 7.6 g/dL (ref 6.5–8.1)

## 2019-06-30 MED ORDER — IRON 325 (65 FE) MG PO TABS: 325.0000 mg | ORAL_TABLET | Freq: Two times a day (BID) | ORAL | 0 refills | Status: DC

## 2019-06-30 MED ORDER — ONDANSETRON 4 MG PO TBDP: 4.0000 mg | ORAL_TABLET | Freq: Three times a day (TID) | ORAL | 0 refills | Status: DC | PRN

## 2019-06-30 MED ORDER — PANTOPRAZOLE SODIUM 20 MG PO TBEC: 20.0000 mg | DELAYED_RELEASE_TABLET | Freq: Every day | ORAL | 0 refills | Status: DC

## 2019-06-30 MED ORDER — IRON 325 (65 FE) MG PO TABS: 325.0000 mg | ORAL_TABLET | Freq: Two times a day (BID) | ORAL | 1 refills | Status: DC

## 2019-06-30 NOTE — ED Triage Notes (Signed)
Pt states for the last week hes felt very fatigued, and states every time he eats he feels like he is going to throw up. Also c/o chills. No pain.

## 2019-06-30 NOTE — ED Provider Notes (Signed)
[facility]    CSN: [id number] Arrival date & time: 06/30/19  1318      History   Chief Complaint Chief Complaint  Patient presents with  . Fatigue  . Nausea    HPI [name] is a 59 y.o. male with a history of microcytic anemia has not been started on iron therapy yet comes to the urgent care with complaints of increasing fatigue and a feeling of nausea anytime he eats.  Patient denies any abdominal pain or vomiting.  No abdominal distention or bloating.  Nausea is associated with any type of diet.  He has lost about 4 pounds over the past couple of weeks.  He denies any fever but admits to having some chills.  No constipation abdominal distention.  Patient denies any dizziness near syncope or syncopal episodes.  Patient was recently diagnosed with right shoulder strain and was started on ibuprofen 800 mg every 8 hours as needed.  Patient was taking the ibuprofen judiciously every 8 hours.  He denies any melanotic stool.  No joint pains.  No shortness of breath, palpitations or chest pain.   HPI  Past Medical History:  Diagnosis Date  . Asthma   . CAD (coronary artery disease)    a. CAD s/p anterior MI in 2008 treated with DES to the LAD.  Marland Kitchen Cataract 2018   both eyes  . Cellulitis of leg, right 10/2014  . COPD (chronic obstructive pulmonary disease) ([place])   . Dental caries   . Diabetes mellitus    TYPE 2  . GERD (gastroesophageal reflux disease)   . Hyperlipidemia   . Hypertension 2017  . Ischemic cardiomyopathy   . Leukocytosis    noted on labs  . Microcytic anemia   . Myocardial infarction ([place]) 2008  . Reactive airway disease   . Thyroid nodule    biopsy negative 2006    Patient Active Problem List   Diagnosis Date Noted  . Microcytic anemia 05/18/2019  . GERD (gastroesophageal reflux disease) 10/26/2017  . Primary hypertension 04/22/2017  . COPD with asthma ([place]) 12/18/2014  . Cataracts, bilateral 06/27/2014  . Cardiomyopathy, ischemic  11/11/2011  . Uncontrolled type 2 diabetes mellitus, without long-term current use of insulin ([place]) 06/12/2009  . Atherosclerosis of native coronary artery of native heart 07/13/2008  . History of MI (myocardial infarction) 04/01/2007  . Sinusitis, chronic 06/18/2006    Past Surgical History:  Procedure Laterality Date  . CORONARY STENT PLACEMENT    . ESOPHAGOGASTRODUODENOSCOPY N/A 03/29/2017   Procedure: ESOPHAGOGASTRODUODENOSCOPY (EGD);  Surgeon: Milus Banister, MD;  Location: [facility] ENDOSCOPY;  Service: Endoscopy;  Laterality: N/A;  . ESOPHAGOGASTRODUODENOSCOPY N/A 03/31/2018   Procedure: ESOPHAGOGASTRODUODENOSCOPY (EGD);  Surgeon: Doran Stabler, MD;  Location: [facility];  Service: Gastroenterology;  Laterality: N/A;  . FOREIGN BODY REMOVAL N/A 03/31/2018   Procedure: FOREIGN BODY REMOVAL;  Surgeon: Doran Stabler, MD;  Location: [facility];  Service: Gastroenterology;  Laterality: N/A;  . solitary pulm and thryoid nodules         Home Medications    Prior to Admission medications   Medication Sig Start Date End Date Taking? Authorizing Provider  albuterol (PROVENTIL HFA;VENTOLIN HFA) 108 (90 Base) MCG/ACT inhaler Inhale 2 puffs into the lungs every 4 (four) hours as needed for wheezing or shortness of breath (((PLAN B))). 07/13/18   Tanda Rockers, MD  aspirin EC 81 MG tablet Take 81 mg by mouth daily.    [provider]  atorvastatin (  LIPITOR) 80 MG tablet Take 0.5 tablets (40 mg total) by mouth daily. 05/18/19   Mullis, Kiersten P, DO  calcium-vitamin D (OSCAL WITH D) 250-125 MG-UNIT tablet Take 1 tablet by mouth 2 (two) times daily.    [provider]  metFORMIN (GLUCOPHAGE) 1000 MG tablet Take 1 tablet (1,000 mg total) by mouth 2 (two) times daily with a meal. 05/18/19   Mullis, Kiersten P, DO  mometasone-formoterol (DULERA) 200-5 MCG/ACT AERO Inhale 2 puffs into the lungs 2 (two) times daily. 07/13/18   Tanda Rockers, MD  ondansetron (ZOFRAN ODT) 4  MG disintegrating tablet Take 1 tablet (4 mg total) by mouth every 8 (eight) hours as needed for nausea or vomiting. 06/30/19   Obert Espindola, Myrene Galas, MD  pantoprazole (PROTONIX) 20 MG tablet Take 1 tablet (20 mg total) by mouth daily. 06/30/19   Jadrien Narine, Myrene Galas, MD  predniSONE (DELTASONE) 10 MG tablet Take 1 tablet (10 mg total) by mouth daily with breakfast. 05/18/19   Mullis, Kiersten P, DO  sacubitril-valsartan (ENTRESTO) 97-103 MG Take 1 tablet by mouth 2 (two) times daily. 05/11/19   Leanor Kail, PA    Family History Family History  Problem Relation Age of Onset  . Diabetes Mother   . Heart attack Brother 10  . Colon cancer Neg Hx   . Esophageal cancer Neg Hx   . Rectal cancer Neg Hx   . Stomach cancer Neg Hx     Social History Social History   Tobacco Use  . Smoking status: Former Smoker    Packs/day: 0.50    Years: 22.00    Pack years: 11.00    Types: Cigarettes    Quit date: 04/21/2006    Years since quitting: 13.2  . Smokeless tobacco: Never Used  . Tobacco comment: quit 10 years ago  Substance Use Topics  . Alcohol use: No    Alcohol/week: 0.0 standard drinks  . Drug use: No     Allergies   Patient has no known allergies.   Review of Systems Review of Systems  Constitutional: Positive for activity change, appetite change, chills, fatigue and unexpected weight change.  HENT: Negative.   Respiratory: Negative for chest tightness, shortness of breath and wheezing.   Cardiovascular: Negative for chest pain and palpitations.  Gastrointestinal: Positive for nausea. Negative for constipation, diarrhea and vomiting.  Genitourinary: Negative.   Skin: Negative.   Neurological: Negative for dizziness, light-headedness, numbness and headaches.     Physical Exam Triage Vital Signs ED Triage Vitals  Enc Vitals Group     BP 06/30/19 1435 128/69     Pulse Rate 06/30/19 1435 100     Resp 06/30/19 1435 (!) 28     Temp 06/30/19 1435 99 F (37.2 C)     Temp src  --      SpO2 06/30/19 1435 100 %     Weight --      Height --      Head Circumference --      Peak Flow --      Pain Score 06/30/19 1438 0     Pain Loc --      Pain Edu? --      Excl. in [place]? --    No data found.  Updated Vital Signs BP 128/69   Pulse 100   Temp 99 F (37.2 C)   Resp (!) 28   SpO2 100%   Visual Acuity Right Eye Distance:   Left Eye Distance:   Bilateral  Distance:    Right Eye Near:   Left Eye Near:    Bilateral Near:     Physical Exam Vitals and nursing note reviewed.  Constitutional:      General: He is not in acute distress.    Appearance: He is ill-appearing.  Eyes:     General:        Right eye: No discharge.        Left eye: No discharge.     Extraocular Movements: Extraocular movements intact.     Conjunctiva/sclera: Conjunctivae normal.  Cardiovascular:     Rate and Rhythm: Normal rate and regular rhythm.     Pulses: Normal pulses.     Heart sounds: No murmur. No friction rub.  Pulmonary:     Effort: Pulmonary effort is normal. No respiratory distress.     Breath sounds: No wheezing or rhonchi.  Abdominal:     General: Bowel sounds are normal. There is no distension.     Tenderness: There is no guarding.     Hernia: No hernia is present.  Musculoskeletal:        General: No swelling. Normal range of motion.     Cervical back: Normal range of motion.  Lymphadenopathy:     Cervical: No cervical adenopathy.  Skin:    General: Skin is warm.     Capillary Refill: Capillary refill takes less than 2 seconds.     Findings: No bruising, erythema or lesion.  Neurological:     Mental Status: He is alert.      UC Treatments / Results  Labs (all labs ordered are listed, but only abnormal results are displayed) Labs Reviewed  CBC WITH DIFFERENTIAL/PLATELET - Abnormal; Notable for the following components:      Result Value   WBC 14.1 (*)    RBC 5.91 (*)    Hemoglobin 10.8 (*)    HCT 37.6 (*)    MCV 63.6 (*)    MCH 18.3 (*)     MCHC 28.7 (*)    RDW 19.6 (*)    Neutro Abs 9.0 (*)    Eosinophils Absolute 1.4 (*)    Basophils Absolute 0.2 (*)    All other components within normal limits  COMPREHENSIVE METABOLIC PANEL - Abnormal; Notable for the following components:   Glucose, Bld 120 (*)    Alkaline Phosphatase 36 (*)    All other components within normal limits  SARS CORONAVIRUS 2 (TAT 6-24 HRS)  TSH    EKG   Radiology No results found.  Procedures Procedures (including critical care time)  Medications Ordered in UC Medications - No data to display  Initial Impression / Assessment and Plan / UC Course  I have reviewed the triage vital signs and the nursing notes.  Pertinent labs & imaging results that were available during my care of the patient were reviewed by me and considered in my medical decision making (see chart for details).     1.  Increasing fatigue in the setting of recent NSAID use: I suspect that the patient's fatigue is associated with iron deficiency anemia I will repeat a CBC, BMP and a TSH I will start patient on iron supplements if patient continues to have microcytic anemia Zofran as needed for nausea Protonix 20 mg orally daily Patient will need to follow-up with the primary care physician for repeat CBC in about 10 to 14 days. Final Clinical Impressions(s) / UC Diagnoses   Final diagnoses:  Fatigue, unspecified type   Discharge  Instructions   None    ED Prescriptions    Medication Sig Dispense Auth. Provider   ondansetron (ZOFRAN ODT) 4 MG disintegrating tablet Take 1 tablet (4 mg total) by mouth every 8 (eight) hours as needed for nausea or vomiting. 20 tablet Hermann Dottavio, Myrene Galas, MD   pantoprazole (PROTONIX) 20 MG tablet Take 1 tablet (20 mg total) by mouth daily. 30 tablet Chaye Misch, Myrene Galas, MD     PDMP not reviewed this encounter.   Chase Picket, MD 06/30/19 1744

## 2019-07-01 ENCOUNTER — Emergency Department (HOSPITAL_COMMUNITY): Payer: Self-pay

## 2019-07-01 ENCOUNTER — Other Ambulatory Visit: Payer: Self-pay

## 2019-07-01 ENCOUNTER — Observation Stay (HOSPITAL_COMMUNITY)
Admission: EM | Admit: 2019-07-01 | Discharge: 2019-07-02 | Disposition: A | Discharge status: Home / Self Care | Payer: Self-pay | Attending: Family Medicine | Admitting: Family Medicine

## 2019-07-01 ENCOUNTER — Encounter (HOSPITAL_COMMUNITY): Payer: Self-pay

## 2019-07-01 DIAGNOSIS — E118 Type 2 diabetes mellitus with unspecified complications: Secondary | ICD-10-CM | POA: Diagnosis present

## 2019-07-01 DIAGNOSIS — G9389 Other specified disorders of brain: Secondary | ICD-10-CM | POA: Insufficient documentation

## 2019-07-01 DIAGNOSIS — E860 Dehydration: Secondary | ICD-10-CM | POA: Insufficient documentation

## 2019-07-01 DIAGNOSIS — I2510 Atherosclerotic heart disease of native coronary artery without angina pectoris: Secondary | ICD-10-CM | POA: Insufficient documentation

## 2019-07-01 DIAGNOSIS — I110 Hypertensive heart disease with heart failure: Secondary | ICD-10-CM | POA: Insufficient documentation

## 2019-07-01 DIAGNOSIS — Z7984 Long term (current) use of oral hypoglycemic drugs: Secondary | ICD-10-CM | POA: Insufficient documentation

## 2019-07-01 DIAGNOSIS — Z7952 Long term (current) use of systemic steroids: Secondary | ICD-10-CM | POA: Insufficient documentation

## 2019-07-01 DIAGNOSIS — I5020 Unspecified systolic (congestive) heart failure: Secondary | ICD-10-CM | POA: Insufficient documentation

## 2019-07-01 DIAGNOSIS — Z7982 Long term (current) use of aspirin: Secondary | ICD-10-CM | POA: Insufficient documentation

## 2019-07-01 DIAGNOSIS — J449 Chronic obstructive pulmonary disease, unspecified: Secondary | ICD-10-CM | POA: Insufficient documentation

## 2019-07-01 DIAGNOSIS — Z955 Presence of coronary angioplasty implant and graft: Secondary | ICD-10-CM | POA: Insufficient documentation

## 2019-07-01 DIAGNOSIS — I951 Orthostatic hypotension: Secondary | ICD-10-CM | POA: Diagnosis present

## 2019-07-01 DIAGNOSIS — E1136 Type 2 diabetes mellitus with diabetic cataract: Secondary | ICD-10-CM | POA: Insufficient documentation

## 2019-07-01 DIAGNOSIS — J322 Chronic ethmoidal sinusitis: Secondary | ICD-10-CM | POA: Insufficient documentation

## 2019-07-01 DIAGNOSIS — Z20822 Contact with and (suspected) exposure to covid-19: Secondary | ICD-10-CM | POA: Insufficient documentation

## 2019-07-01 DIAGNOSIS — Z8249 Family history of ischemic heart disease and other diseases of the circulatory system: Secondary | ICD-10-CM | POA: Insufficient documentation

## 2019-07-01 DIAGNOSIS — Z833 Family history of diabetes mellitus: Secondary | ICD-10-CM | POA: Insufficient documentation

## 2019-07-01 DIAGNOSIS — R55 Syncope and collapse: Principal | ICD-10-CM

## 2019-07-01 DIAGNOSIS — E1165 Type 2 diabetes mellitus with hyperglycemia: Secondary | ICD-10-CM | POA: Diagnosis present

## 2019-07-01 DIAGNOSIS — D509 Iron deficiency anemia, unspecified: Secondary | ICD-10-CM | POA: Diagnosis present

## 2019-07-01 DIAGNOSIS — I255 Ischemic cardiomyopathy: Secondary | ICD-10-CM | POA: Insufficient documentation

## 2019-07-01 DIAGNOSIS — I252 Old myocardial infarction: Secondary | ICD-10-CM | POA: Insufficient documentation

## 2019-07-01 DIAGNOSIS — N179 Acute kidney failure, unspecified: Secondary | ICD-10-CM | POA: Diagnosis present

## 2019-07-01 DIAGNOSIS — Z7951 Long term (current) use of inhaled steroids: Secondary | ICD-10-CM | POA: Insufficient documentation

## 2019-07-01 DIAGNOSIS — K219 Gastro-esophageal reflux disease without esophagitis: Secondary | ICD-10-CM | POA: Insufficient documentation

## 2019-07-01 DIAGNOSIS — Z87891 Personal history of nicotine dependence: Secondary | ICD-10-CM | POA: Insufficient documentation

## 2019-07-01 DIAGNOSIS — E876 Hypokalemia: Secondary | ICD-10-CM | POA: Insufficient documentation

## 2019-07-01 DIAGNOSIS — Z79899 Other long term (current) drug therapy: Secondary | ICD-10-CM | POA: Insufficient documentation

## 2019-07-01 DIAGNOSIS — E785 Hyperlipidemia, unspecified: Secondary | ICD-10-CM | POA: Insufficient documentation

## 2019-07-01 LAB — CBC
HCT: 33.1 % — ABNORMAL LOW (ref 39.0–52.0)
Hemoglobin: 9.5 g/dL — ABNORMAL LOW (ref 13.0–17.0)
MCH: 18.8 pg — ABNORMAL LOW (ref 26.0–34.0)
MCHC: 28.7 g/dL — ABNORMAL LOW (ref 30.0–36.0)
MCV: 65.4 fL — ABNORMAL LOW (ref 80.0–100.0)
Platelets: 252 10*3/uL (ref 150–400)
RBC: 5.06 MIL/uL (ref 4.22–5.81)
RDW: 19.4 % — ABNORMAL HIGH (ref 11.5–15.5)
WBC: 13.9 10*3/uL — ABNORMAL HIGH (ref 4.0–10.5)
nRBC: 0 % (ref 0.0–0.2)

## 2019-07-01 LAB — COMPREHENSIVE METABOLIC PANEL
ALT: 19 U/L (ref 0–44)
AST: 24 U/L (ref 15–41)
Albumin: 3 g/dL — ABNORMAL LOW (ref 3.5–5.0)
Alkaline Phosphatase: 25 U/L — ABNORMAL LOW (ref 38–126)
Anion gap: 10 (ref 5–15)
BUN: 13 mg/dL (ref 6–20)
CO2: 17 mmol/L — ABNORMAL LOW (ref 22–32)
Calcium: 7.5 mg/dL — ABNORMAL LOW (ref 8.9–10.3)
Chloride: 115 mmol/L — ABNORMAL HIGH (ref 98–111)
Creatinine, Ser: 1.72 mg/dL — ABNORMAL HIGH (ref 0.61–1.24)
GFR calc Af Amer: 49 mL/min — ABNORMAL LOW (ref >60)
GFR calc non Af Amer: 42 mL/min — ABNORMAL LOW (ref >60)
Glucose, Bld: 104 mg/dL — ABNORMAL HIGH (ref 70–99)
Potassium: 3.2 mmol/L — ABNORMAL LOW (ref 3.5–5.1)
Sodium: 142 mmol/L (ref 135–145)
Total Bilirubin: 0.2 mg/dL — ABNORMAL LOW (ref 0.3–1.2)
Total Protein: 5.1 g/dL — ABNORMAL LOW (ref 6.5–8.1)

## 2019-07-01 LAB — SARS CORONAVIRUS 2 (TAT 6-24 HRS): SARS Coronavirus 2: NEGATIVE

## 2019-07-01 LAB — MAGNESIUM: Magnesium: 1.2 mg/dL — ABNORMAL LOW (ref 1.7–2.4)

## 2019-07-01 LAB — TROPONIN I (HIGH SENSITIVITY)
Troponin I (High Sensitivity): 4 ng/L (ref <18)
Troponin I (High Sensitivity): <2 ng/L (ref <18)

## 2019-07-01 MED ORDER — POTASSIUM CHLORIDE CRYS ER 20 MEQ PO TBCR
40.0000 meq | EXTENDED_RELEASE_TABLET | Freq: Once | ORAL | Status: AC
  Administered 2019-07-01: 21:00:00 40 meq via ORAL
  Filled 2019-07-01: qty 2

## 2019-07-01 MED ORDER — SODIUM CHLORIDE 0.9% FLUSH: 3.0000 mL | Freq: Two times a day (BID) | INTRAVENOUS | Status: DC

## 2019-07-01 MED ORDER — ALBUTEROL SULFATE (2.5 MG/3ML) 0.083% IN NEBU: 2.5000 mg | INHALATION_SOLUTION | RESPIRATORY_TRACT | Status: DC | PRN

## 2019-07-01 MED ORDER — PREDNISONE 10 MG PO TABS
10.0000 mg | ORAL_TABLET | Freq: Every day | ORAL | Status: DC
  Administered 2019-07-02: 10 mg via ORAL
  Filled 2019-07-01: qty 1

## 2019-07-01 MED ORDER — ACETAMINOPHEN 500 MG PO TABS
1000.0000 mg | ORAL_TABLET | Freq: Four times a day (QID) | ORAL | Status: DC | PRN
  Administered 2019-07-02: 1000 mg via ORAL
  Filled 2019-07-01: qty 2

## 2019-07-01 MED ORDER — FERROUS SULFATE 325 (65 FE) MG PO TABS
325.0000 mg | ORAL_TABLET | Freq: Every day | ORAL | Status: DC
  Administered 2019-07-02: 01:00:00 325 mg via ORAL
  Filled 2019-07-01: qty 1

## 2019-07-01 MED ORDER — PANTOPRAZOLE SODIUM 20 MG PO TBEC
20.0000 mg | DELAYED_RELEASE_TABLET | Freq: Every day | ORAL | Status: DC
  Administered 2019-07-02: 10:00:00 20 mg via ORAL
  Filled 2019-07-01: qty 1

## 2019-07-01 MED ORDER — POTASSIUM & SODIUM PHOSPHATES 280-160-250 MG PO PACK: 1.0000 | PACK | Freq: Three times a day (TID) | ORAL | Status: DC

## 2019-07-01 MED ORDER — ENOXAPARIN SODIUM 40 MG/0.4ML ~~LOC~~ SOLN
40.0000 mg | Freq: Every day | SUBCUTANEOUS | Status: DC
  Administered 2019-07-02: 40 mg via SUBCUTANEOUS
  Filled 2019-07-01: qty 0.4

## 2019-07-01 MED ORDER — ATORVASTATIN CALCIUM 40 MG PO TABS
40.0000 mg | ORAL_TABLET | Freq: Every day | ORAL | Status: DC
  Administered 2019-07-02: 40 mg via ORAL
  Filled 2019-07-01: qty 1

## 2019-07-01 MED ORDER — CALCIUM CARBONATE-VITAMIN D 500-200 MG-UNIT PO TABS: 1.0000 | ORAL_TABLET | Freq: Two times a day (BID) | ORAL | Status: DC

## 2019-07-01 MED ORDER — SODIUM CHLORIDE 0.9 % IV SOLN: INTRAVENOUS | Status: DC

## 2019-07-01 MED ORDER — MOMETASONE FURO-FORMOTEROL FUM 200-5 MCG/ACT IN AERO
2.0000 | INHALATION_SPRAY | Freq: Two times a day (BID) | RESPIRATORY_TRACT | Status: DC
  Filled 2019-07-01: qty 8.8

## 2019-07-01 MED ORDER — ASPIRIN EC 81 MG PO TBEC
81.0000 mg | DELAYED_RELEASE_TABLET | Freq: Every day | ORAL | Status: DC
  Administered 2019-07-02: 81 mg via ORAL
  Filled 2019-07-01: qty 1

## 2019-07-01 MED ORDER — ONDANSETRON 4 MG PO TBDP
4.0000 mg | ORAL_TABLET | Freq: Three times a day (TID) | ORAL | Status: DC | PRN
  Filled 2019-07-01: qty 1

## 2019-07-01 MED ORDER — ADULT MULTIVITAMIN W/MINERALS CH
1.0000 | ORAL_TABLET | Freq: Every day | ORAL | Status: DC
  Administered 2019-07-02: 1 via ORAL
  Filled 2019-07-01: qty 1

## 2019-07-01 MED ORDER — MAGNESIUM SULFATE 2 GM/50ML IV SOLN
2.0000 g | Freq: Once | INTRAVENOUS | Status: AC
  Administered 2019-07-01: 21:00:00 2 g via INTRAVENOUS
  Filled 2019-07-01: qty 50

## 2019-07-01 NOTE — ED Provider Notes (Signed)
[facility] EMERGENCY DEPARTMENT Provider Note   CSN: [id number] Arrival date & time: 07/01/19  1848     History No chief complaint on file.   [name] is a 59 y.o. male.  HPI Patient is a 59 year old with PMH of asthma, CAD (s/p MI in 2008), COPD, type 2 diabetes, GERD, hyperlipidemia, hypertension resenting to the ED today following a syncopal event.  Patient reports that over the past week, he has experienced generalized fatigue and worsening nausea following meals.  He has had decreased oral intake due to  nausea and has lost 4 pounds over the past week.  He endorses dizziness with standing up and says that he feels like the room is spinning.  Today, patient was sitting when he suddenly passed out.  He denies any prior prodromal symptoms.  He says that he fell and hit his head.  A coworker called EMS who transported him to the ED.  In the ED, patient says that he feels fine and denies any chest pain, shortness of breath, palpitations, fever, chills, diarrhea.  Patient low risk for HIV and TB.    Past Medical History:  Diagnosis Date  . Asthma   . CAD (coronary artery disease)    a. CAD s/p anterior MI in 2008 treated with DES to the LAD.  Marland Kitchen Cataract 2018   both eyes  . Cellulitis of leg, right 10/2014  . COPD (chronic obstructive pulmonary disease) ([place])   . Dental caries   . Diabetes mellitus    TYPE 2  . GERD (gastroesophageal reflux disease)   . Hyperlipidemia   . Hypertension 2017  . Ischemic cardiomyopathy   . Leukocytosis    noted on labs  . Microcytic anemia   . Myocardial infarction ([place]) 2008  . Reactive airway disease   . Thyroid nodule    biopsy negative 2006    Patient Active Problem List   Diagnosis Date Noted  . Microcytic anemia 05/18/2019  . GERD (gastroesophageal reflux disease) 10/26/2017  . Primary hypertension 04/22/2017  . COPD with asthma ([place]) 12/18/2014  . Cataracts, bilateral 06/27/2014  . Cardiomyopathy, ischemic  11/11/2011  . Uncontrolled type 2 diabetes mellitus, without long-term current use of insulin ([place]) 06/12/2009  . Atherosclerosis of native coronary artery of native heart 07/13/2008  . History of MI (myocardial infarction) 04/01/2007  . Sinusitis, chronic 06/18/2006    Past Surgical History:  Procedure Laterality Date  . CORONARY STENT PLACEMENT    . ESOPHAGOGASTRODUODENOSCOPY N/A 03/29/2017   Procedure: ESOPHAGOGASTRODUODENOSCOPY (EGD);  Surgeon: Milus Banister, MD;  Location: [facility]COPY;  Service: Endoscopy;  Laterality: N/A;  . ESOPHAGOGASTRODUODENOSCOPY N/A 03/31/2018   Procedure: ESOPHAGOGASTRODUODENOSCOPY (EGD);  Surgeon: Doran Stabler, MD;  Location: [facility];  Service: Gastroenterology;  Laterality: N/A;  . FOREIGN BODY REMOVAL N/A 03/31/2018   Procedure: FOREIGN BODY REMOVAL;  Surgeon: Doran Stabler, MD;  Location: [place];  Service: Gastroenterology;  Laterality: N/A;  . solitary pulm and thryoid nodules         Family History  Problem Relation Age of Onset  . Diabetes Mother   . Heart attack Brother 31  . Colon cancer Neg Hx   . Esophageal cancer Neg Hx   . Rectal cancer Neg Hx   . Stomach cancer Neg Hx     Social History   Tobacco Use  . Smoking status: Former Smoker    Packs/day: 0.50    Years: 22.00    Pack years: 11.00  Types: Cigarettes    Quit date: 04/21/2006    Years since quitting: 13.2  . Smokeless tobacco: Never Used  . Tobacco comment: quit 10 years ago  Substance Use Topics  . Alcohol use: No    Alcohol/week: 0.0 standard drinks  . Drug use: No    Home Medications Prior to Admission medications   Medication Sig Start Date End Date Taking? Authorizing Provider  albuterol (PROVENTIL HFA;VENTOLIN HFA) 108 (90 Base) MCG/ACT inhaler Inhale 2 puffs into the lungs every 4 (four) hours as needed for wheezing or shortness of breath (((PLAN B))). 07/13/18   Tanda Rockers, MD  aspirin EC 81 MG tablet Take 81 mg by mouth daily.     [provider]  atorvastatin (LIPITOR) 80 MG tablet Take 0.5 tablets (40 mg total) by mouth daily. 05/18/19   Mullis, Kiersten P, DO  calcium-vitamin D (OSCAL WITH D) 250-125 MG-UNIT tablet Take 1 tablet by mouth 2 (two) times daily.    [provider]  Ferrous Sulfate (IRON) 325 (65 Fe) MG TABS Take 1 tablet (325 mg total) by mouth in the morning and at bedtime. 06/30/19 07/30/19  Chase Picket, MD  metFORMIN (GLUCOPHAGE) 1000 MG tablet Take 1 tablet (1,000 mg total) by mouth 2 (two) times daily with a meal. 05/18/19   Mullis, Kiersten P, DO  mometasone-formoterol (DULERA) 200-5 MCG/ACT AERO Inhale 2 puffs into the lungs 2 (two) times daily. 07/13/18   Tanda Rockers, MD  ondansetron (ZOFRAN ODT) 4 MG disintegrating tablet Take 1 tablet (4 mg total) by mouth every 8 (eight) hours as needed for nausea or vomiting. 06/30/19   Lamptey, Myrene Galas, MD  pantoprazole (PROTONIX) 20 MG tablet Take 1 tablet (20 mg total) by mouth daily. 06/30/19   Lamptey, Myrene Galas, MD  predniSONE (DELTASONE) 10 MG tablet Take 1 tablet (10 mg total) by mouth daily with breakfast. 05/18/19   Mullis, Kiersten P, DO  sacubitril-valsartan (ENTRESTO) 97-103 MG Take 1 tablet by mouth 2 (two) times daily. 05/11/19   Leanor Kail, PA    Allergies    Patient has no known allergies.  Review of Systems   Review of Systems  Constitutional: Positive for appetite change, fatigue and unexpected weight change. Negative for chills and fever.  HENT: Negative for congestion and sore throat.   Eyes: Negative for photophobia and visual disturbance.  Respiratory: Negative for cough and shortness of breath.   Cardiovascular: Negative for chest pain and palpitations.  Gastrointestinal: Positive for nausea. Negative for abdominal pain, diarrhea and vomiting.  Genitourinary: Negative for decreased urine volume and dysuria.  Musculoskeletal: Negative for arthralgias and back pain.  Skin: Negative for rash and wound.    Neurological: Positive for syncope. Negative for seizures, weakness, light-headedness and headaches.  Psychiatric/Behavioral: Negative for agitation.  All other systems reviewed and are negative.   Physical Exam Updated Vital Signs BP 108/86 (BP Location: Left Arm)   Pulse (!) 101   Temp 98.2 F (36.8 C) (Oral)   Resp 16   SpO2 97%   Physical Exam Vitals and nursing note reviewed.  Constitutional:      General: He is not in acute distress.    Appearance: Normal appearance. He is well-developed. He is not ill-appearing, toxic-appearing or diaphoretic.  HENT:     Head: Normocephalic and atraumatic.     Right Ear: External ear normal.     Left Ear: External ear normal.     Nose: Nose normal. No congestion or rhinorrhea.  Mouth/Throat:     Mouth: Mucous membranes are moist.     Pharynx: Oropharynx is clear.  Eyes:     Extraocular Movements: Extraocular movements intact.     Pupils: Pupils are equal, round, and reactive to light.  Neck:     Comments: Area of erythema on posterior neck with mild associated TTP. Cardiovascular:     Rate and Rhythm: Normal rate and regular rhythm.     Pulses: Normal pulses.     Heart sounds: Normal heart sounds. No murmur.  Pulmonary:     Effort: Pulmonary effort is normal. No respiratory distress.     Breath sounds: Normal breath sounds. No stridor. No wheezing, rhonchi or rales.  Chest:     Chest wall: No tenderness.  Abdominal:     General: There is no distension.     Palpations: Abdomen is soft.     Tenderness: There is no abdominal tenderness. There is no guarding or rebound.  Musculoskeletal:        General: Normal range of motion.     Cervical back: Normal range of motion and neck supple. No rigidity.     Right lower leg: No edema.     Left lower leg: No edema.  Skin:    General: Skin is warm and dry.     Capillary Refill: Capillary refill takes less than 2 seconds.  Neurological:     General: No focal deficit present.      Mental Status: He is alert and oriented to person, place, and time. Mental status is at baseline.     Cranial Nerves: No cranial nerve deficit.     Sensory: No sensory deficit.     Motor: No weakness.     Coordination: Coordination normal.  Psychiatric:        Mood and Affect: Mood normal.     ED Results / Procedures / Treatments   Labs (all labs ordered are listed, but only abnormal results are displayed) Labs Reviewed  COMPREHENSIVE METABOLIC PANEL - Abnormal; Notable for the following components:      Result Value   Potassium 3.2 (*)    Chloride 115 (*)    CO2 17 (*)    Glucose, Bld 104 (*)    Creatinine, Ser 1.72 (*)    Calcium 7.5 (*)    Total Protein 5.1 (*)    Albumin 3.0 (*)    Alkaline Phosphatase 25 (*)    Total Bilirubin 0.2 (*)    GFR calc non Af Amer 42 (*)    GFR calc Af Amer 49 (*)    All other components within normal limits  MAGNESIUM - Abnormal; Notable for the following components:   Magnesium 1.2 (*)    All other components within normal limits  CBC - Abnormal; Notable for the following components:   WBC 13.9 (*)    Hemoglobin 9.5 (*)    HCT 33.1 (*)    MCV 65.4 (*)    MCH 18.8 (*)    MCHC 28.7 (*)    RDW 19.4 (*)    All other components within normal limits  SARS CORONAVIRUS 2 (TAT 6-24 HRS)  HIV ANTIBODY (ROUTINE TESTING W REFLEX)  MAGNESIUM  CBC  BASIC METABOLIC PANEL  TROPONIN I (HIGH SENSITIVITY)  TROPONIN I (HIGH SENSITIVITY)    EKG EKG Interpretation  Date/Time:  Friday July 01 2019 18:54:41 EST Ventricular Rate:  97 PR Interval:    QRS Duration: 101 QT Interval:  337 QTC Calculation: 428 R  Axis:   -59 Text Interpretation: Sinus rhythm ST changes laterally. No STEMI. Confirmed by Nanda Quinton [phone]) on 07/01/2019 7:08:50 PM   Radiology CT Head Wo Contrast  Result Date: 07/01/2019 CLINICAL DATA:  Minor head trauma with normal mental status. Syncope. EXAM: CT HEAD WITHOUT CONTRAST CT CERVICAL SPINE WITHOUT CONTRAST TECHNIQUE:  Multidetector CT imaging of the head and cervical spine was performed following the standard protocol without intravenous contrast. Multiplanar CT image reconstructions of the cervical spine were also generated. COMPARISON:  MR brain dated Sep 13, 2004. CT maxillofacial dated February 27, 2015. FINDINGS: CT HEAD FINDINGS Brain: Chronic left medial frontal and right inferior frontal encephalomalacia and small left frontal subcortical white matter infarction similar to 2006. No acute intracranial hemorrhage, midline shift, mass effect or abnormal extra-axial fluid collection. Subacute appearing hypoattenuation in the right frontal subcortical white matter, increased in size since 2006. Vascular: No hyperdense vessel. Mild calcified atherosclerosis of the bilateral carotid siphons. Skull: No acute skull fracture, including overlying the frontal sinuses. Stable appearance of the right skull base petrous temporal bone and lesser wing of sphenoid compared to 2016, which may be secondary to demineralization or remote prior trauma. No subgaleal hematoma or scalp contusion. Sinuses/Orbits: Chronic bilateral sphenomaxillary and ethmoidal sinusitis with thickened sinus walls. Remote prior surgical excision of the bilateral medial maxillary walls. Air-fluid level in the left frontal sinus and bilateral frontal sinus mild mucosal thickening. Normal orbital soft tissues. Other: Bilateral recurrent mastoid effusions. Extension of fluid and/or cholesteatoma in both middle ears. CT CERVICAL SPINE FINDINGS Alignment: Reversal of the cervical lordosis. Grade 1 retrolisthesis of C5 on C6, likely degenerative given the hypertrophic endplate changes in this region. Skull base and vertebrae: No vertebral compression fracture or fracture lucency. Normal bilateral craniocervical articulation. Soft tissues and spinal canal: Fatty infiltration of the bilateral parotid glands, commonly seen in the setting of metabolic syndrome. Bilateral  internal carotid artery mild calcified atherosclerosis. Disc levels: Moderate disc space narrowing at C5-6 and C6-7. no traumatic disc space widening or displacement. Upper chest: No apical pneumothorax. A 5 mm subsolid nodule in the right lung apex, series 5, image 97. No follow-up recommended. This recommendation follows the consensus statement: Guidelines for Management of Incidental Pulmonary Nodules Detected on CT Images: From the Fleischner Society 2017; Radiology 2017; [phone]. Other: A 5 mm benign-density 1,177 Hounsfield unit ovoid sclerotic lesion in the T3 vertebral body, and a second measuring 1,004 19 Hounsfield units and 7 mm in the right C3 pedicle. A periapical lucency at the right mandibular second molar. IMPRESSION: 1. No CT evidence of acute intracranial or cervical spine trauma. 2. 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 3. Chronic paranasal sinus disease with sinus wall thickening and sequela of prior sinus surgery, as well as an acute air-fluid level in the left frontal sinus. 4. Bilateral recurrent or persistent mastoid effusions with fluid and/or cholesteatoma in both middle ears. 5. Stable appearance of the right skull base petrous temporal bone and lesser wing of sphenoid compared to 2016, which may be secondary to trabecular demineralization or remote prior trauma. 6. Sclerotic lesions of the T3 vertebral body and right C3 pedicle with Hounsfield unit measurements consistent with a benign bone islands or treated metastatic disease. 7. Fatty infiltration of the bilateral parotid glands, commonly seen in the setting of metabolic syndrome. 8. Right mandibular second molar periapical lucency. 9. Apical 5 mm subsolid nodule of the right lung apex. No follow-up needed if patient  is low-risk. A dedicated non-contrast chest CT can be considered now and again in 12 months if patient is high-risk. This recommendation follows the  consensus statement: Guidelines for Management of Incidental Pulmonary Nodules Detected on CT Images: From the Fleischner Society 2017; Radiology 2017; [phone]. Electronically Signed   By: Revonda Humphrey   On: 07/01/2019 21:49   CT Cervical Spine Wo Contrast  Result Date: 07/01/2019 CLINICAL DATA:  Minor head trauma with normal mental status. Syncope. EXAM: CT HEAD WITHOUT CONTRAST CT CERVICAL SPINE WITHOUT CONTRAST TECHNIQUE: Multidetector CT imaging of the head and cervical spine was performed following the standard protocol without intravenous contrast. Multiplanar CT image reconstructions of the cervical spine were also generated. COMPARISON:  MR brain dated Sep 13, 2004. CT maxillofacial dated February 27, 2015. FINDINGS: CT HEAD FINDINGS Brain: Chronic left medial frontal and right inferior frontal encephalomalacia and small left frontal subcortical white matter infarction similar to 2006. No acute intracranial hemorrhage, midline shift, mass effect or abnormal extra-axial fluid collection. Subacute appearing hypoattenuation in the right frontal subcortical white matter, increased in size since 2006. Vascular: No hyperdense vessel. Mild calcified atherosclerosis of the bilateral carotid siphons. Skull: No acute skull fracture, including overlying the frontal sinuses. Stable appearance of the right skull base petrous temporal bone and lesser wing of sphenoid compared to 2016, which may be secondary to demineralization or remote prior trauma. No subgaleal hematoma or scalp contusion. Sinuses/Orbits: Chronic bilateral sphenomaxillary and ethmoidal sinusitis with thickened sinus walls. Remote prior surgical excision of the bilateral medial maxillary walls. Air-fluid level in the left frontal sinus and bilateral frontal sinus mild mucosal thickening. Normal orbital soft tissues. Other: Bilateral recurrent mastoid effusions. Extension of fluid and/or cholesteatoma in both middle ears. CT CERVICAL SPINE  FINDINGS Alignment: Reversal of the cervical lordosis. Grade 1 retrolisthesis of C5 on C6, likely degenerative given the hypertrophic endplate changes in this region. Skull base and vertebrae: No vertebral compression fracture or fracture lucency. Normal bilateral craniocervical articulation. Soft tissues and spinal canal: Fatty infiltration of the bilateral parotid glands, commonly seen in the setting of metabolic syndrome. Bilateral internal carotid artery mild calcified atherosclerosis. Disc levels: Moderate disc space narrowing at C5-6 and C6-7. no traumatic disc space widening or displacement. Upper chest: No apical pneumothorax. A 5 mm subsolid nodule in the right lung apex, series 5, image 97. No follow-up recommended. This recommendation follows the consensus statement: Guidelines for Management of Incidental Pulmonary Nodules Detected on CT Images: From the Fleischner Society 2017; Radiology 2017; [phone]. Other: A 5 mm benign-density 1,177 Hounsfield unit ovoid sclerotic lesion in the T3 vertebral body, and a second measuring 1,004 19 Hounsfield units and 7 mm in the right C3 pedicle. A periapical lucency at the right mandibular second molar. IMPRESSION: 1. No CT evidence of acute intracranial or cervical spine trauma. 2. 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 3. Chronic paranasal sinus disease with sinus wall thickening and sequela of prior sinus surgery, as well as an acute air-fluid level in the left frontal sinus. 4. Bilateral recurrent or persistent mastoid effusions with fluid and/or cholesteatoma in both middle ears. 5. Stable appearance of the right skull base petrous temporal bone and lesser wing of sphenoid compared to 2016, which may be secondary to trabecular demineralization or remote prior trauma. 6. Sclerotic lesions of the T3 vertebral body and right C3 pedicle with Hounsfield unit measurements consistent with a benign  bone islands or treated metastatic disease. 7. Fatty  infiltration of the bilateral parotid glands, commonly seen in the setting of metabolic syndrome. 8. Right mandibular second molar periapical lucency. 9. Apical 5 mm subsolid nodule of the right lung apex. No follow-up needed if patient is low-risk. A dedicated non-contrast chest CT can be considered now and again in 12 months if patient is high-risk. This recommendation follows the consensus statement: Guidelines for Management of Incidental Pulmonary Nodules Detected on CT Images: From the Fleischner Society 2017; Radiology 2017; [phone]. Electronically Signed   By: Revonda Humphrey   On: 07/01/2019 21:49   DG Chest Portable 1 View  Result Date: 07/01/2019 CLINICAL DATA:  Syncope EXAM: PORTABLE CHEST 1 VIEW COMPARISON:  03/29/2017 chest radiograph. FINDINGS: Stable cardiomediastinal silhouette with normal heart size. No pneumothorax. No pleural effusion. Lungs appear clear, with no acute consolidative airspace disease and no pulmonary edema. IMPRESSION: No active disease. Electronically Signed   By: Ilona Sorrel M.D.   On: 07/01/2019 20:36    Procedures Procedures (including critical care time)  Medications Ordered in ED Medications  acetaminophen (TYLENOL) tablet 1,000 mg (1,000 mg Oral Given 07/02/19 0055)  aspirin EC tablet 81 mg (has no administration in time range)  atorvastatin (LIPITOR) tablet 40 mg (40 mg Oral Given 07/02/19 0051)  predniSONE (DELTASONE) tablet 10 mg (has no administration in time range)  ondansetron (ZOFRAN-ODT) disintegrating tablet 4 mg (has no administration in time range)  pantoprazole (PROTONIX) EC tablet 20 mg (has no administration in time range)  ferrous sulfate tablet 325 mg (325 mg Oral Given 07/02/19 0051)  calcium-vitamin D (OSCAL WITH D) 500-200 MG-UNIT per tablet 1 tablet (has no administration in time range)  multivitamin with minerals tablet 1 tablet (has no administration in time range)  albuterol  (PROVENTIL) (2.5 MG/3ML) 0.083% nebulizer solution 2.5 mg (has no administration in time range)  mometasone-formoterol (DULERA) 200-5 MCG/ACT inhaler 2 puff (has no administration in time range)  sodium chloride flush (NS) 0.9 % injection 3 mL (3 mLs Intravenous Not Given 07/02/19 0057)  enoxaparin (LOVENOX) injection 40 mg (has no administration in time range)  0.9 %  sodium chloride infusion ( Intravenous New Bag/Given 07/02/19 0026)  magnesium sulfate IVPB 2 g 50 mL (0 g Intravenous Stopped 07/01/19 2214)  potassium chloride SA (KLOR-CON) CR tablet 40 mEq (40 mEq Oral Given 07/01/19 2118)    ED Course  I have reviewed the triage vital signs and the nursing notes.  Pertinent labs & imaging results that were available during my care of the patient were reviewed by me and considered in my medical decision making (see chart for details).    MDM Rules/Calculators/A&P                     Patient is a 59 year old with PMH of asthma, CAD (s/p MI in 2008), COPD, type 2 diabetes, GERD, hyperlipidemia, hypertension resenting to the ED today following a syncopal event.  Physical exam unremarkable.  BP 108/86, HR 101, RR 16, SPO2 97% on room air.  Afebrile.  On arrival, patient appears generally well and is displaying no signs of acute distress.  Patient's biggest concern is syncope and he says that he fell backwards and hit his head on the ground.  Patient denies experiencing any prodromal symptoms prior to the syncopal event.  He denies any current chest pain, shortness of breath or palpitations.  He has had many constitutional symptoms over the past week including generalized fatigue, nausea, decreased appetite and weight loss.  He was evaluated  in urgent care yesterday and his symptoms were thought to be related to chronic microcytic anemia.  We will collect EKG, CBC, CMP, mag, serial troponins for further evaluation of patient's syncope.  His TSH yesterday was within normal limits.  Obtain CT head and  C-spine given his fall.  EKG with normal sinus rhythm with Q waves in II, III, aVF, V3 through V6 and small ST elevations in V4 and V5.  The Q waves appear new from previous study.  CBC with WBC 13.9, hemoglobin 9.5 (lower from 10.8 yesterday).  CMP with potassium 3.2 (lower from 4.4 previously), CO2 17, creatinine 1.72 (elevated from previous 1.13), calcium 7.5, total protein 5.1, albumin 3.0.  Serial troponins 4 and <2, consecutively.  Covid negative.  No evidence of acute injuries on CT head or CT C-spine.  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  Patient's calcium and potassium repleted.  Based off of work-up, low suspicion for ACS.  However, his EKG is concerning for potentially older infarct.  Patient is low risk for PE.  Chest x-ray not concerning for pneumonia or pneumothorax.  Syncope not related to thyroid abnormalities.  He does appear to have a slight AKI in the setting of 1 week of decreased p.o. intake.  Patient denies any prodromal symptoms so syncopal episode does not sound consistent with vasovagal event.  Given patient's extensive cardiac history there is concern for arrhythmia or valvular abnormality causing his syncope.  Given his multiple electrolyte abnormalities, EKG changes, AKI and multitude of constitutional symptoms, we feel as though the patient would benefit from admission and inpatient work-up for syncope.  Patient admitted to family medicine service.  For details on hospital course following admission, please refer to inpatient team's note.  Patient stable at time of admission.  Patient assessed and evaluated with Dr. Laverta Baltimore.  Nadeen Landau, MD    Final Clinical Impression(s) / ED Diagnoses Final diagnoses:  Syncope and collapse    Rx / DC Orders ED Discharge Orders    None       Nadeen Landau,  MD 07/02/19 0139    Margette Fast, MD 07/02/19 [phone]

## 2019-07-01 NOTE — ED Notes (Signed)
Daughter updated per request.

## 2019-07-01 NOTE — H&P (Addendum)
[facility] Admission History and Physical Service Pager: [phone]  Patient name: [name] Medical record number: [id number] Date of birth: 07/07/1960 Age: 59 y.o. Gender: male  Primary Care Provider: Danna Hefty, DO Consultants: None Code Status: Full Code Preferred Emergency Contact: Forman Parekh, daughter, [phone]  Chief Complaint: Syncope, weakness  Assessment and Plan: Ambros Hafele is a 59 y.o. male presenting with syncopal episode. PMH is significant for T2DM, Hx of MI, Microcytic Anemia 2/2 iron deficiency, COPD, HTN, HLD, GERD  Syncope  Patient reports syncope event earlier today while at work.  He was sitting in a chair and stood up and "blacked out".  He fell to the ground and reports that he hit his head.  In the emergency department patient vital signs showed pulse ranging from 73-1 06, respirations ranging from 16-25, blood pressures ranging from 91/54-117/66, O2 sat 97-100% on room air.  Patient's lab work was significant for K-3.2, CR-1.72, Mg-1.2, albumin-3, WBC-13.9, Hgb-9.5, MVC-65.4, troponin-4 > less than 2.  EKG showed sinus rhythm.  Head CT showed no acute intracranial or cervical spine trauma.  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  Differential includes orthostatic hypotension vs cardiac syncope vs seizure activity.  This is most likely orthostasis related given patient's poor p.o. intake along with soft blood pressures on admission.  Orthostatic vitals were not collected in the ED but will be done once patient is admitted.  Cardiac syncope is less likely given patient has recent echo and no regular rhythm documented recently.  It is still worth evaluating so we should consider repeat echocardiogram and overnight cardiac telemetry.  Also the differential would be seizures.  Patient has no history of seizure activity and the description of the event is not  consistent with seizure. -Admit for observation to inpatient teaching service with Dr. McDiarmid as attending -Continuous cardiac monitoring -Repeat echocardiogram ordered -Repeat EKG in a.m. -Orthostatic vital signs -Consider neuro consult given findings on head CT -Holding home blood pressure medications due to soft blood pressures along with AKI -Strict I's and O's -Zofran 4 mg every 8 as needed for nausea or vomiting -Daily weights -IVF with normal saline at 100 mL/h -PT/OT eval and treat  HFrEF Patient's most recent echo was completed 04/10/2019 and showed an LVEF 25 to 30% with mild increased left ventricular hypertrophy.  Left ventricle demonstrates regional wall motion abnormalities.  Grade 1 diastolic dysfunction.  Home medications include Entresto 97-1 9 3  mg twice daily.  Patient appears volume down at this time. -Holding Entresto at this time given soft blood pressures along with AKI -Gentle rehydration due to AKI as well as patient appears volume down -Daily weights -Monitor respiratory status  AKI Patient's creatinine on admission was 1.72 up from 1.13 one-day prior.  Most likely due to poor p.o. fluid intake. -Normal saline at 75 mL/h -Holding home blood pressure medications -Avoid nephrotoxic agents -Morning BMP  Primary hypertension Blood pressure since arrival have ranged from 91/54-117/66.  Home medications include Entresto 97-103 mg twice daily. -Holding home medications due to low blood pressures along with AKI  Hypokalemia Potassium on admission was 3.2.  Patient received 40 mEq of Klor-Con and the emergency department. -Morning CMP -Replete as necessary  Hypomagnesemia Magnesium in the ED prior to admission was 1.2.  This was repleted in the ED. -Morning mag check -Replete as necessary  COPD with asthma Home medications include Proventil as needed for shortness of  breath, Dulera, prednisone 10 mg daily. -Continue home medications  Iron deficiency  anemia Hemoglobin on admission 9.5, MVC-65.4.  Patient was recently seen by PCP for this issue and recent iron studies showed decreased iron, elevated U IBC, decreased ferritin, decreased iron saturation.  Patient was prescribed ferrous sulfate 325 mg daily. -Continue ferrous sulfate 325 mg daily  Type 2 diabetes Medications include Metformin 1000 mg twice daily.  Most recent hemoglobin A1c was 6.9 in December 2020. -Holding Metformin at this time due to AKI  Hyperlipidemia Home medications include Lipitor 40 mg daily. -Continue Lipitor  GERD Home medications include Protonix 20 mg daily. -Continue home medications  FEN/GI: Carb modified Prophylaxis: Lovenox  Disposition: Admit to Observation, Med-Surg  History of Present Illness:  [name] is a 59 y.o. male presenting with a syncopal episode that occurred today. 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 He describes the sensation as more unable to stand. He has had some dizziness that started every time he stands up for the past four days that has not been present before. He remembers falling but states he did "black out" and woke up on the floor. He does not endorse feeling like the room is spinning. He states at the time he completely lost consciousness and passed out. Lately he has had poor oral intake. He went to Urgent Care the day before because he has had intermittent upset stomach and nausea recently causing him not to eat. He states he got some "stomach pills". He has also not been drinking a lot of fluid recently also. No blood in the urine or stool. He did not have chest pain with this event or within the past few months. No shortness of breath. No headaches. No weakness. He endorses lack of appetite because his heartburn was causing him to avoid food and was making him nauseated and he was afraid he would vomit.  He has never had a syncopal episode  happen before.  Review Of Systems: Per HPI with the following additions:  Review of Systems  Constitutional: Positive for chills. Negative for fever and malaise/fatigue.  HENT: Negative for congestion and sore throat.   Eyes: Negative for blurred vision and double vision.  Respiratory: Negative for cough, sputum production, shortness of breath and wheezing.   Cardiovascular: Negative for chest pain, palpitations and leg swelling.  Gastrointestinal: Positive for abdominal pain, heartburn and nausea. Negative for blood in stool, constipation, diarrhea, melena and vomiting.  Genitourinary: Negative for dysuria, frequency, hematuria and urgency.  Musculoskeletal: Negative for joint pain and myalgias.  Neurological: Positive for loss of consciousness and weakness. Negative for dizziness, tingling, sensory change, speech change, focal weakness, seizures and headaches.    Patient Active Problem List   Diagnosis Date Noted  . Microcytic anemia 05/18/2019  . GERD (gastroesophageal reflux disease) 10/26/2017  . Primary hypertension 04/22/2017  . COPD with asthma ([place]) 12/18/2014  . Cataracts, bilateral 06/27/2014  . Cardiomyopathy, ischemic 11/11/2011  . Uncontrolled type 2 diabetes mellitus, without long-term current use of insulin ([place]) 06/12/2009  . Atherosclerosis of native coronary artery of native heart 07/13/2008  . History of MI (myocardial infarction) 04/01/2007  . Sinusitis, chronic 06/18/2006    Past Medical History: Past Medical History:  Diagnosis Date  . Asthma   . CAD (coronary artery disease)    a. CAD s/p anterior MI in 2008 treated with DES to the LAD.  Marland Kitchen  Cataract 2018   both eyes  . Cellulitis of leg, right 10/2014  . COPD (chronic obstructive pulmonary disease) ([place])   . Dental caries   . Diabetes mellitus    TYPE 2  . GERD (gastroesophageal reflux disease)   . Hyperlipidemia   . Hypertension 2017  . Ischemic cardiomyopathy   . Leukocytosis    noted on labs   . Microcytic anemia   . Myocardial infarction ([place]) 2008  . Reactive airway disease   . Thyroid nodule    biopsy negative 2006    Past Surgical History: Past Surgical History:  Procedure Laterality Date  . CORONARY STENT PLACEMENT    . ESOPHAGOGASTRODUODENOSCOPY N/A 03/29/2017   Procedure: ESOPHAGOGASTRODUODENOSCOPY (EGD);  Surgeon: Milus Banister, MD;  Location: [facility] ENDOSCOPY;  Service: Endoscopy;  Laterality: N/A;  . ESOPHAGOGASTRODUODENOSCOPY N/A 03/31/2018   Procedure: ESOPHAGOGASTRODUODENOSCOPY (EGD);  Surgeon: Doran Stabler, MD;  Location: [place];  Service: Gastroenterology;  Laterality: N/A;  . FOREIGN BODY REMOVAL N/A 03/31/2018   Procedure: FOREIGN BODY REMOVAL;  Surgeon: Doran Stabler, MD;  Location: [place];  Service: Gastroenterology;  Laterality: N/A;  . solitary pulm and thryoid nodules      Social History: Social History   Tobacco Use  . Smoking status: Former Smoker    Packs/day: 0.50    Years: 22.00    Pack years: 11.00    Types: Cigarettes    Quit date: 04/21/2006    Years since quitting: 13.2  . Smokeless tobacco: Never Used  . Tobacco comment: quit 10 years ago  Substance Use Topics  . Alcohol use: No    Alcohol/week: 0.0 standard drinks  . Drug use: No   Additional social history:  Please also refer to relevant sections of EMR.  Family History: Family History  Problem Relation Age of Onset  . Diabetes Mother   . Heart attack Brother 19  . Colon cancer Neg Hx   . Esophageal cancer Neg Hx   . Rectal cancer Neg Hx   . Stomach cancer Neg Hx     Allergies and Medications: No Known Allergies No current facility-administered medications on file prior to encounter.   Current Outpatient Medications on File Prior to Encounter  Medication Sig Dispense Refill  . acetaminophen (TYLENOL) 500 MG tablet Take 1,000 mg by mouth every 6 (six) hours as needed for headache (pain).    Marland Kitchen albuterol (PROVENTIL HFA;VENTOLIN HFA) 108 (90 Base)  MCG/ACT inhaler Inhale 2 puffs into the lungs every 4 (four) hours as needed for wheezing or shortness of breath (((PLAN B))). 3 Inhaler 3  . aspirin EC 81 MG tablet Take 81 mg by mouth daily.    Marland Kitchen atorvastatin (LIPITOR) 80 MG tablet Take 0.5 tablets (40 mg total) by mouth daily. (Patient taking differently: Take 40 mg by mouth at bedtime. ) 45 tablet 3  . calcium-vitamin D (OSCAL WITH D) 250-125 MG-UNIT tablet Take 1 tablet by mouth 2 (two) times daily.    . Ferrous Sulfate (IRON) 325 (65 Fe) MG TABS Take 1 tablet (325 mg total) by mouth in the morning and at bedtime. 60 tablet 1  . metFORMIN (GLUCOPHAGE) 1000 MG tablet Take 1 tablet (1,000 mg total) by mouth 2 (two) times daily with a meal. 180 tablet 3  . mometasone-formoterol (DULERA) 200-5 MCG/ACT AERO Inhale 2 puffs into the lungs 2 (two) times daily. 1 Inhaler 0  . Multiple Vitamin (MULTIVITAMIN WITH MINERALS) TABS tablet Take 1 tablet by mouth daily.    Marland Kitchen  ondansetron (ZOFRAN ODT) 4 MG disintegrating tablet Take 1 tablet (4 mg total) by mouth every 8 (eight) hours as needed for nausea or vomiting. 20 tablet 0  . pantoprazole (PROTONIX) 20 MG tablet Take 1 tablet (20 mg total) by mouth daily. 30 tablet 0  . predniSONE (DELTASONE) 10 MG tablet Take 1 tablet (10 mg total) by mouth daily with breakfast. 90 tablet 1  . sacubitril-valsartan (ENTRESTO) 97-103 MG Take 1 tablet by mouth 2 (two) times daily. 60 tablet 11    Objective: BP (!) 106/53   Pulse 92   Temp 98.2 F (36.8 C) (Oral)   Resp (!) 24   SpO2 99%  Physical Exam  Constitutional: He is well-developed, well-nourished, and in no distress.  HENT:  Head: Normocephalic and atraumatic.  Eyes: Pupils are equal, round, and reactive to light. Conjunctivae and EOM are normal.  Neck: No thyromegaly present.  Cardiovascular: Regular rhythm, normal heart sounds and intact distal pulses. Exam reveals no gallop and no friction rub.  No murmur heard. Tachycardic in the low 100s.    Pulmonary/Chest: Effort normal and breath sounds normal. No respiratory distress. He has no wheezes. He has no rales. He exhibits no tenderness.  Abdominal: Soft. Bowel sounds are normal. He exhibits no distension.  Musculoskeletal:        General: No tenderness or edema.     Cervical back: Normal range of motion and neck supple.  Lymphadenopathy:    He has no cervical adenopathy.  Neurological: He is alert. No cranial nerve deficit. GCS score is 15.  Skin: Skin is warm and dry. No rash noted. No erythema.  Psychiatric: Mood, affect and judgment normal.     Labs and Imaging: CBC BMET  Recent Labs  Lab 07/01/19 2010  WBC 13.9*  HGB 9.5*  HCT 33.1*  PLT 252   Recent Labs  Lab 07/01/19 1922  NA 142  K 3.2*  CL 115*  CO2 17*  BUN 13  CREATININE 1.72*  GLUCOSE 104*  CALCIUM 7.5*    Troponin 4>2  EKG: Sinus rhythm with ST changes laterally.  No STEMI  CT Head Wo Contrast  Result Date: 07/01/2019 CLINICAL DATA:  Minor head trauma with normal mental status. Syncope. EXAM: CT HEAD WITHOUT CONTRAST CT CERVICAL SPINE WITHOUT CONTRAST TECHNIQUE: Multidetector CT imaging of the head and cervical spine was performed following the standard protocol without intravenous contrast. Multiplanar CT image reconstructions of the cervical spine were also generated. COMPARISON:  MR brain dated Sep 13, 2004. CT maxillofacial dated February 27, 2015. FINDINGS: CT HEAD FINDINGS Brain: Chronic left medial frontal and right inferior frontal encephalomalacia and small left frontal subcortical white matter infarction similar to 2006. No acute intracranial hemorrhage, midline shift, mass effect or abnormal extra-axial fluid collection. Subacute appearing hypoattenuation in the right frontal subcortical white matter, increased in size since 2006. Vascular: No hyperdense vessel. Mild calcified atherosclerosis of the bilateral carotid siphons. Skull: No acute skull fracture, including overlying the frontal  sinuses. Stable appearance of the right skull base petrous temporal bone and lesser wing of sphenoid compared to 2016, which may be secondary to demineralization or remote prior trauma. No subgaleal hematoma or scalp contusion. Sinuses/Orbits: Chronic bilateral sphenomaxillary and ethmoidal sinusitis with thickened sinus walls. Remote prior surgical excision of the bilateral medial maxillary walls. Air-fluid level in the left frontal sinus and bilateral frontal sinus mild mucosal thickening. Normal orbital soft tissues. Other: Bilateral recurrent mastoid effusions. Extension of fluid and/or cholesteatoma in both middle ears.  CT CERVICAL SPINE FINDINGS Alignment: Reversal of the cervical lordosis. Grade 1 retrolisthesis of C5 on C6, likely degenerative given the hypertrophic endplate changes in this region. Skull base and vertebrae: No vertebral compression fracture or fracture lucency. Normal bilateral craniocervical articulation. Soft tissues and spinal canal: Fatty infiltration of the bilateral parotid glands, commonly seen in the setting of metabolic syndrome. Bilateral internal carotid artery mild calcified atherosclerosis. Disc levels: Moderate disc space narrowing at C5-6 and C6-7. no traumatic disc space widening or displacement. Upper chest: No apical pneumothorax. A 5 mm subsolid nodule in the right lung apex, series 5, image 97. No follow-up recommended. This recommendation follows the consensus statement: Guidelines for Management of Incidental Pulmonary Nodules Detected on CT Images: From the Fleischner Society 2017; Radiology 2017; [phone]. Other: A 5 mm benign-density 1,177 Hounsfield unit ovoid sclerotic lesion in the T3 vertebral body, and a second measuring 1,004 19 Hounsfield units and 7 mm in the right C3 pedicle. A periapical lucency at the right mandibular second molar. IMPRESSION: 1. No CT evidence of acute intracranial or cervical spine trauma. 2. Subacute-appearing infarct of the right  frontal subcortical white matter, increased in size since 2006. 3. Chronic paranasal sinus disease with sinus wall thickening and sequela of prior sinus surgery, as well as an acute air-fluid level in the left frontal sinus. 4. Bilateral recurrent or persistent mastoid effusions with fluid and/or cholesteatoma in both middle ears. 5. Stable appearance of the right skull base petrous temporal bone and lesser wing of sphenoid compared to 2016, which may be secondary to trabecular demineralization or remote prior trauma. 6. Sclerotic lesions of the T3 vertebral body and right C3 pedicle with Hounsfield unit measurements consistent with a benign bone islands or treated metastatic disease. 7. Fatty infiltration of the bilateral parotid glands, commonly seen in the setting of metabolic syndrome. 8. Right mandibular second molar periapical lucency. 9. Apical 5 mm subsolid nodule of the right lung apex. No follow-up needed if patient is low-risk. A dedicated non-contrast chest CT can be considered now and again in 12 months if patient is high-risk. This recommendation follows the consensus statement: Guidelines for Management of Incidental Pulmonary Nodules Detected on CT Images: From the Fleischner Society 2017; Radiology 2017; [phone]. Electronically Signed   By: Revonda Humphrey   On: 07/01/2019 21:49   CT Cervical Spine Wo Contrast  Result Date: 07/01/2019 CLINICAL DATA:  Minor head trauma with normal mental status. Syncope. EXAM: CT HEAD WITHOUT CONTRAST CT CERVICAL SPINE WITHOUT CONTRAST TECHNIQUE: Multidetector CT imaging of the head and cervical spine was performed following the standard protocol without intravenous contrast. Multiplanar CT image reconstructions of the cervical spine were also generated. COMPARISON:  MR brain dated Sep 13, 2004. CT maxillofacial dated February 27, 2015. FINDINGS: CT HEAD FINDINGS Brain: Chronic left medial frontal and right inferior frontal encephalomalacia and small left  frontal subcortical white matter infarction similar to 2006. No acute intracranial hemorrhage, midline shift, mass effect or abnormal extra-axial fluid collection. Subacute appearing hypoattenuation in the right frontal subcortical white matter, increased in size since 2006. Vascular: No hyperdense vessel. Mild calcified atherosclerosis of the bilateral carotid siphons. Skull: No acute skull fracture, including overlying the frontal sinuses. Stable appearance of the right skull base petrous temporal bone and lesser wing of sphenoid compared to 2016, which may be secondary to demineralization or remote prior trauma. No subgaleal hematoma or scalp contusion. Sinuses/Orbits: Chronic bilateral sphenomaxillary and ethmoidal sinusitis with thickened sinus walls. Remote prior surgical excision of the  bilateral medial maxillary walls. Air-fluid level in the left frontal sinus and bilateral frontal sinus mild mucosal thickening. Normal orbital soft tissues. Other: Bilateral recurrent mastoid effusions. Extension of fluid and/or cholesteatoma in both middle ears. CT CERVICAL SPINE FINDINGS Alignment: Reversal of the cervical lordosis. Grade 1 retrolisthesis of C5 on C6, likely degenerative given the hypertrophic endplate changes in this region. Skull base and vertebrae: No vertebral compression fracture or fracture lucency. Normal bilateral craniocervical articulation. Soft tissues and spinal canal: Fatty infiltration of the bilateral parotid glands, commonly seen in the setting of metabolic syndrome. Bilateral internal carotid artery mild calcified atherosclerosis. Disc levels: Moderate disc space narrowing at C5-6 and C6-7. no traumatic disc space widening or displacement. Upper chest: No apical pneumothorax. A 5 mm subsolid nodule in the right lung apex, series 5, image 97. No follow-up recommended. This recommendation follows the consensus statement: Guidelines for Management of Incidental Pulmonary Nodules Detected on CT  Images: From the Fleischner Society 2017; Radiology 2017; [phone]. Other: A 5 mm benign-density 1,177 Hounsfield unit ovoid sclerotic lesion in the T3 vertebral body, and a second measuring 1,004 19 Hounsfield units and 7 mm in the right C3 pedicle. A periapical lucency at the right mandibular second molar. IMPRESSION: 1. No CT evidence of acute intracranial or cervical spine trauma. 2. 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 3. Chronic paranasal sinus disease with sinus wall thickening and sequela of prior sinus surgery, as well as an acute air-fluid level in the left frontal sinus. 4. Bilateral recurrent or persistent mastoid effusions with fluid and/or cholesteatoma in both middle ears. 5. Stable appearance of the right skull base petrous temporal bone and lesser wing of sphenoid compared to 2016, which may be secondary to trabecular demineralization or remote prior trauma. 6. Sclerotic lesions of the T3 vertebral body and right C3 pedicle with Hounsfield unit measurements consistent with a benign bone islands or treated metastatic disease. 7. Fatty infiltration of the bilateral parotid glands, commonly seen in the setting of metabolic syndrome. 8. Right mandibular second molar periapical lucency. 9. Apical 5 mm subsolid nodule of the right lung apex. No follow-up needed if patient is low-risk. A dedicated non-contrast chest CT can be considered now and again in 12 months if patient is high-risk. This recommendation follows the consensus statement: Guidelines for Management of Incidental Pulmonary Nodules Detected on CT Images: From the Fleischner Society 2017; Radiology 2017; [id number]. Electronically Signed   By: Revonda Humphrey   On: 07/01/2019 21:49   DG Chest Portable 1 View  Result Date: 07/01/2019 CLINICAL DATA:  Syncope EXAM: PORTABLE CHEST 1 VIEW COMPARISON:  03/29/2017 chest radiograph. FINDINGS: Stable cardiomediastinal silhouette  with normal heart size. No pneumothorax. No pleural effusion. Lungs appear clear, with no acute consolidative airspace disease and no pulmonary edema. IMPRESSION: No active disease. Electronically Signed   By: Ilona Sorrel M.D.   On: 07/01/2019 20:36     Gifford Shave, MD 07/01/2019, 10:36 PM PGY-1, [place] Intern pager: [phone], text pages welcome  Resident Attestation   I saw and evaluated the patient, performing the key elements of the service. I personally performed or re-performed the history, physical exam, and medical decision making activities of this service and have verified that the service and findings are accurately documented in the resident's note. I developed the management plan that is described in the resident's note, and I agree with the content, with my edits above in red.  Harolyn Rutherford, DO [facility], PGY-3

## 2019-07-01 NOTE — ED Triage Notes (Signed)
Pt stated that he was in a store & began to feel dizzy and felt like everything went black in vision but denies LOC as he fell to the floor, he endorses hitting his head on the hardwood floor and denies any HA or pain. Upon arrival to ED pt, verbal - able to make needs known, no distress noted. PMHx of a stent, asymptomatic of this type of event.

## 2019-07-01 NOTE — ED Notes (Signed)
Patient's daughter, Presley Raddle, looking for update on her dad. Phone number [phone]

## 2019-07-02 ENCOUNTER — Observation Stay (HOSPITAL_BASED_OUTPATIENT_CLINIC_OR_DEPARTMENT_OTHER): Payer: Self-pay

## 2019-07-02 DIAGNOSIS — R55 Syncope and collapse: Secondary | ICD-10-CM

## 2019-07-02 DIAGNOSIS — N179 Acute kidney failure, unspecified: Secondary | ICD-10-CM | POA: Diagnosis present

## 2019-07-02 LAB — BASIC METABOLIC PANEL
Anion gap: 11 (ref 5–15)
BUN: 15 mg/dL (ref 6–20)
CO2: 23 mmol/L (ref 22–32)
Calcium: 8.9 mg/dL (ref 8.9–10.3)
Chloride: 104 mmol/L (ref 98–111)
Creatinine, Ser: 1.62 mg/dL — ABNORMAL HIGH (ref 0.61–1.24)
GFR calc Af Amer: 53 mL/min — ABNORMAL LOW (ref >60)
GFR calc non Af Amer: 45 mL/min — ABNORMAL LOW (ref >60)
Glucose, Bld: 134 mg/dL — ABNORMAL HIGH (ref 70–99)
Potassium: 4.8 mmol/L (ref 3.5–5.1)
Sodium: 138 mmol/L (ref 135–145)

## 2019-07-02 LAB — CBC
HCT: 31.5 % — ABNORMAL LOW (ref 39.0–52.0)
Hemoglobin: 9.1 g/dL — ABNORMAL LOW (ref 13.0–17.0)
MCH: 18.6 pg — ABNORMAL LOW (ref 26.0–34.0)
MCHC: 28.9 g/dL — ABNORMAL LOW (ref 30.0–36.0)
MCV: 64.3 fL — ABNORMAL LOW (ref 80.0–100.0)
Platelets: 267 10*3/uL (ref 150–400)
RBC: 4.9 MIL/uL (ref 4.22–5.81)
RDW: 19 % — ABNORMAL HIGH (ref 11.5–15.5)
WBC: 12.1 10*3/uL — ABNORMAL HIGH (ref 4.0–10.5)
nRBC: 0 % (ref 0.0–0.2)

## 2019-07-02 LAB — MAGNESIUM: Magnesium: 2.1 mg/dL (ref 1.7–2.4)

## 2019-07-02 LAB — ECHOCARDIOGRAM COMPLETE

## 2019-07-02 LAB — GLUCOSE, CAPILLARY: Glucose-Capillary: 108 mg/dL — ABNORMAL HIGH (ref 70–99)

## 2019-07-02 LAB — SARS CORONAVIRUS 2 (TAT 6-24 HRS): SARS Coronavirus 2: NEGATIVE

## 2019-07-02 MED ORDER — PERFLUTREN LIPID MICROSPHERE
1.0000 mL | INTRAVENOUS | Status: AC | PRN
  Administered 2019-07-02: 09:00:00 2 mL via INTRAVENOUS
  Filled 2019-07-02: qty 10

## 2019-07-02 MED ORDER — ATORVASTATIN CALCIUM 40 MG PO TABS: 40.0000 mg | ORAL_TABLET | Freq: Every day | ORAL | 0 refills | Status: DC

## 2019-07-02 MED ORDER — SODIUM CHLORIDE 0.9 % IV BOLUS
500.0000 mL | Freq: Once | INTRAVENOUS | Status: AC
  Administered 2019-07-02: 500 mL via INTRAVENOUS

## 2019-07-02 NOTE — Progress Notes (Signed)
Discharge: IV access discontinued.  CCMD notified and telebox returned.  Discharge medications and instructions reviewed with patient.

## 2019-07-02 NOTE — Progress Notes (Signed)
  Echocardiogram 2D Echocardiogram has been performed.  [name] 07/02/2019, 8:41 AM

## 2019-07-02 NOTE — Assessment & Plan Note (Signed)
Metzner Index is less than 13 which is consistent with a minor beta-thallasemia

## 2019-07-02 NOTE — Evaluation (Signed)
Physical Therapy Evaluation Patient Details Name: [name] MRN: [id number] DOB: December 25, 1960 Today's Date: 07/02/2019   History of Present Illness  Pt is a 59 y/o male who presents with syncope event while at work. He was sitting in a chair and stood up and "blacked out". He fell to the ground and reports that he hit his head. 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 PMHx includes asthma, CAD, cataract, COPD, DM, HTN, MI   Clinical Impression  Pt presents to PT with no functional deficits, at his baseline, and mobilizing independently. Pt does demonstrate orthostatic BP during transition from supine to standing but is asymptomatic. PT provides education on strategies to reduce risk of having another syncopal episode due to orthostatic BP. Pt expresses no concerns for return home and has no PT or DME needs at this time. Acute PT signing off.    Follow Up Recommendations No PT follow up    Equipment Recommendations  None recommended by PT    Recommendations for Other Services       Precautions / Restrictions Precautions Precautions: Other (comment) Precaution Comments: watch BP, orthostatic with initial sit/stand Restrictions Weight Bearing Restrictions: No      Mobility  Bed Mobility Overal bed mobility: Modified Independent             General bed mobility comments: increased time  Transfers Overall transfer level: Independent Equipment used: None                Ambulation/Gait Ambulation/Gait assistance: Independent Gait Distance (Feet): 150 Feet Assistive device: None Gait Pattern/deviations: Step-through pattern Gait velocity: functional Gait velocity interpretation: >2.62 ft/sec, indicative of community ambulatory General Gait Details: steady step through gait, able to perform head turns in all directions without LOB or  reports of dizziness  Stairs Stairs: (pt declines need for stair training)          Wheelchair Mobility    Modified Rankin (Stroke Patients Only)       Balance Overall balance assessment: No apparent balance deficits (not formally assessed)                                           Pertinent Vitals/Pain Pain Assessment: No/denies pain    Home Living Family/patient expects to be discharged to:: Private residence Living Arrangements: Spouse/significant other Available Help at Discharge: Family;Available 24 hours/day Type of Home: Apartment Home Access: Stairs to enter Entrance Stairs-Rails: None Entrance Stairs-Number of Steps: 3 Home Layout: One level Home Equipment: None      Prior Function Level of Independence: Independent         Comments: working, works at a [employer]        Extremity/Trunk Assessment   Upper Extremity Assessment Upper Extremity Assessment: Overall WFL for tasks assessed    Lower Extremity Assessment Lower Extremity Assessment: Overall WFL for tasks assessed    Cervical / Trunk Assessment Cervical / Trunk Assessment: Normal  Communication   Communication: Other (comment)(broken english at times)  Cognition Arousal/Alertness: Awake/alert Behavior During Therapy: WFL for tasks assessed/performed Overall Cognitive Status: Within Functional Limits for tasks assessed  General Comments General comments (skin integrity, edema, etc.): Orthostatic vitals: supine 139/63, sitting 120/57, standing 108/64, standign after 2 minutes 125/66. Pt denies symptoms. PT provides education on changing positions slowly, allowing time before going from laying to standing, and performing LE exercise to mitigate drop in BP prior to standing. Also educates pt on taking seated breaks while working, avoid locking knees, and remain hydrated during work.     Exercises     Assessment/Plan    PT Assessment Patent does not need any further PT services  PT Problem List         PT Treatment Interventions      PT Goals (Current goals can be found in the Care Plan section)  Acute Rehab PT Goals Patient Stated Goal: home when able    Frequency     Barriers to discharge        Co-evaluation PT/OT/SLP Co-Evaluation/Treatment: Yes Reason for Co-Treatment: For patient/therapist safety PT goals addressed during session: Mobility/safety with mobility OT goals addressed during session: ADL's and self-care       AM-PAC PT "6 Clicks" Mobility  Outcome Measure Help needed turning from your back to your side while in a flat bed without using bedrails?: None Help needed moving from lying on your back to sitting on the side of a flat bed without using bedrails?: None Help needed moving to and from a bed to a chair (including a wheelchair)?: None Help needed standing up from a chair using your arms (e.g., wheelchair or bedside chair)?: None Help needed to walk in hospital room?: None Help needed climbing 3-5 steps with a railing? : None 6 Click Score: 24    End of Session   Activity Tolerance: Patient tolerated treatment well Patient left: in chair;with call bell/phone within reach Nurse Communication: Mobility status      Time: [id number] PT Time Calculation (min) (ACUTE ONLY): 18 min   Charges:   PT Evaluation $PT Eval Low Complexity: Bogue Chitto, PT, DPT Acute Rehabilitation Pager: [phone]   Zenaida Niece 07/02/2019, 10:16 AM
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
## 2019-07-02 NOTE — Discharge Instructions (Signed)
While in the hospital you were treated for syncope, or fainting, due to dehydration.  To prevent syncopal episodes, we recommend changing positions slowly: Sit on the edge of the bed or seat for a few minutes and pump your hands or legs before standing.  This should help reduce sensation of fainting, as well as help return blood to your heart from your legs.  Please a follow-up appointment with Dr. Guadalupe Dawn at the family medicine clinic on March 16 at 10 AM.

## 2019-07-02 NOTE — Evaluation (Addendum)
Occupational Therapy Evaluation Patient Details Name: [name] MRN: [id number] DOB: [birth year] Today's Date: 07/02/2019    History of Present Illness Pt is a 59 y/o male who presents with syncope event while at work. He was sitting in a chair and stood up and "blacked out". He fell to the ground and reports that he hit his head. 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 PMHx includes asthma, CAD, cataract, COPD, DM, HTN, MI    Clinical Impression   This 59 y/o male presents with the above. PTA pt independent with ADL, iADL and functional mobility. Today pt tolerated bed mobility, functional transfers and mobility in room and hallway without AD at [place] independent level. Pt did present with orthostatic BP with positional changes (see PT eval for details), with BP stabilizing with prolonged standing; pt denies dizziness/lightheadedness with activity throughout. Educated pt in general safety and simple HEP to increase blood flow prior to sitting/standing given recent orthostasis with pt verbalizing understanding. He reports plans to return home with spouse who can assist PRN. Questions answered with no further acute OT needs identified. Feel pt is safe to return home from OT standpoint once medically ready. Acute OT to sign off, thank you for this referral.     Follow Up Recommendations  No OT follow up;Supervision - Intermittent    Equipment Recommendations  None recommended by OT           Precautions / Restrictions Precautions Precautions: Other (comment) Precaution Comments: watch BP, orthostatic with initial sit/stand Restrictions Weight Bearing Restrictions: No      Mobility Bed Mobility Overal bed mobility: Modified Independent                Transfers Overall transfer level: Independent                     Balance Overall balance assessment: No apparent balance deficits (not formally assessed)                                         ADL either performed or assessed with clinical judgement   ADL Overall ADL's : Needs assistance/impaired Eating/Feeding: Independent;Sitting   Grooming: Supervision/safety;Standing   Upper Body Bathing: Modified independent;Sitting   Lower Body Bathing: Supervison/ safety;Sit to/from stand   Upper Body Dressing : Modified independent;Sitting   Lower Body Dressing: Supervision/safety;Sit to/from stand   Toilet Transfer: Supervision/safety;Ambulation   Toileting- Clothing Manipulation and Hygiene: Supervision/safety;Sit to/from stand       Functional mobility during ADLs: Supervision/safety General ADL Comments: supervision for LB and standing ADL given recent history of syncope event. Pt denies dizziness throughout                         Pertinent Vitals/Pain Pain Assessment: No/denies pain     Hand Dominance     Extremity/Trunk Assessment Upper Extremity Assessment Upper Extremity Assessment: Overall WFL for tasks assessed   Lower Extremity Assessment Lower Extremity Assessment: Defer to PT evaluation;Overall [facility] for tasks assessed   Cervical / Trunk Assessment Cervical / Trunk Assessment: Normal   Communication Communication Communication: Other (comment)(at times with broken english)   Cognition Arousal/Alertness: Awake/alert Behavior During Therapy: WFL for tasks assessed/performed Overall Cognitive Status: Within Functional Limits for tasks assessed  General Comments       Exercises     Shoulder Instructions      Home Living Family/patient expects to be discharged to:: Private residence Living Arrangements: Spouse/significant other Available Help at Discharge: Family;Available 24 hours/day Type of Home: Apartment Home Access: Stairs to  enter Entrance Stairs-Number of Steps: 3 Entrance Stairs-Rails: None Home Layout: One level     Bathroom Shower/Tub: Teacher, early years/pre: Standard     Home Equipment: None          Prior Functioning/Environment Level of Independence: Independent        Comments: working, works at a [employer] Problem List: Decreased knowledge of precautions;Decreased activity tolerance      OT Treatment/Interventions:      OT Goals(Current goals can be found in the care plan section) Acute Rehab OT Goals Patient Stated Goal: home when able OT Goal Formulation: All assessment and education complete, DC therapy  OT Frequency:     Barriers to D/C:            Co-evaluation PT/OT/SLP Co-Evaluation/Treatment: Yes Reason for Co-Treatment: For patient/therapist safety(pt with recent hx of syncopy)   OT goals addressed during session: ADL's and self-care      AM-PAC OT "6 Clicks" Daily Activity     Outcome Measure Help from another person eating meals?: None Help from another person taking care of personal grooming?: None Help from another person toileting, which includes using toliet, bedpan, or urinal?: None Help from another person bathing (including washing, rinsing, drying)?: None Help from another person to put on and taking off regular upper body clothing?: None Help from another person to put on and taking off regular lower body clothing?: None 6 Click Score: 24   End of Session Nurse Communication: Mobility status  Activity Tolerance: Patient tolerated treatment well Patient left: in chair;with call bell/phone within reach  OT Visit Diagnosis: Other abnormalities of gait and mobility (R26.89);Dizziness and giddiness (R42)                Time: [id number] OT Time Calculation (min): 19 min Charges:  OT General Charges $OT Visit: 1 Visit OT Evaluation $OT Eval Moderate Complexity: [place], OT Acute Rehabilitation  Services Pager [phone] Office [phone]   Raymondo Band 07/02/2019, 9:42 AM

## 2019-07-02 NOTE — Progress Notes (Signed)
Family Medicine Teaching Service Daily Progress Note Intern Pager: [phone]  Patient name: [name] Medical record number: [id number] Date of birth: 04-Dec-1960 Age: 59 y.o. Gender: male  Primary Care Provider: Danna Hefty, DO Consultants: None Code Status: Full  Pt Overview and Major Events to Date:  3/12-admitted for syncopal event  Assessment and Plan:  Syncope  Patient reports syncope event earlier today while at work.  He was sitting in a chair and stood up and "blacked out".  He fell to the ground and reports that he hit his head.  In the emergency department patient vital signs showed pulse ranging from 73-1 06, respirations ranging from 16-25, blood pressures ranging from 91/54-117/66, O2 sat 97-100% on room air.  Patient's lab work was significant for K-3.2, CR-1.72, Mg-1.2, albumin-3, WBC-13.9, Hgb-9.5, MVC-65.4, troponin-4 > less than 2.  EKG showed sinus rhythm.  Head CT showed no acute intracranial or cervical spine trauma.  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 Orthostatic vital signs showed stable blood pressures throughout but a drop in the pulse rate after standing for 3 minutes from 120 bpm to 64 bpm. -Continuous cardiac monitoring -Repeat echocardiogram completed this morning, results are pending -Repeat EKG in a.m. -Orthostatic vital signs -Consider neuro consult given findings on head CT -Holding home blood pressure medications due to soft blood pressures along with AKI -Strict I's and O's -Continue Zofran 4 mg every 8 as needed for nausea or vomiting -Daily weights -IVF with normal saline at 100 mL/h -500 mL normal saline bolus -PT/OT eval and treat  HFrEF Patient's most recent echo was completed 04/10/2019 and showed an LVEF 25 to 30% with mild increased left ventricular hypertrophy.  Left ventricle demonstrates regional wall motion abnormalities.  Grade 1 diastolic dysfunction.   Home medications include Entresto 97-193 mg twice daily.  Patient appears volume down at this time. -Holding Entresto at this time given soft blood pressures along with AKI -Gentle rehydration due to AKI as well as patient appears volume down -Daily weights -Monitor respiratory status  AKI Patient's creatinine on admission was 1.72 up from 1.13 one-day prior.  Most likely due to poor p.o. fluid intake.  Repeat creatinine this morning was still elevated but improving at 1.62. -Normal saline at 75 mL/h -Holding home blood pressure medications -Avoid nephrotoxic agents -Morning BMP  Primary hypertension Blood pressure since arrival have ranged from 91/54-117/66.  Home medications include Entresto 97-103 mg twice daily. -Holding home medications due to low blood pressures along with AKI  Hypokalemia Potassium on admission was 3.2.  Patient received 40 mEq of Klor-Con and the emergency department.  Potassium this morning was 4.8. -Morning CMP -Replete as necessary  Hypomagnesemia Magnesium in the ED prior to admission was 1.2.  This was repleted in the ED. Morning magnesium was 2.1. -Morning mag check -Replete as necessary  COPD with asthma Home medications include Proventil as needed for shortness of breath, Dulera, prednisone 10 mg daily. -Continue home medications  Iron deficiency anemia Hemoglobin on admission 9.5, MVC-65.4.  Patient was recently seen by PCP for this issue and recent iron studies showed decreased iron, elevated U IBC, decreased ferritin, decreased iron saturation.  Patient was prescribed ferrous sulfate 325 mg daily. -Continue ferrous sulfate 325 mg daily  Type 2 diabetes Medications include Metformin 1000 mg twice daily.  Most recent hemoglobin A1c was 6.9 in December 2020. -Holding Metformin at this time due to AKI  Hyperlipidemia Home medications  include Lipitor 40 mg daily. -Continue Lipitor  GERD Home medications include Protonix 20 mg  daily. -Continue home medications  FEN/GI: Carb modified Prophylaxis: Lovenox  Disposition: Plan for discharge home today  Subjective:  Patient reports he is doing well this morning.  Denies any dizziness.  Says that he feels weak but feels this is most likely due to the fact that he did not get any dinner last night.  No acute concerns  Objective: Temp:  [98.2 F (36.8 C)] 98.2 F (36.8 C) (03/12 1843) Pulse Rate:  [73-106] 104 (03/12 2300) Resp:  [16-25] 23 (03/12 2300) BP: (91-117)/(52-86) 103/65 (03/13 0027) SpO2:  [97 %-100 %] 99 % (03/12 2300) Physical Exam: General: Sleeping comfortably when I enter the room, easy to arouse Cardiovascular: Tachycardic in the low 100s, regular rhythm, no murmurs appreciated Respiratory: Clear to auscultation, no increased work of breathing Abdomen: Soft, nontender, positive bowel sounds Extremities: No edema noted  Laboratory: Recent Labs  Lab 06/30/19 1455 07/01/19 2010 07/02/19 0329  WBC 14.1* 13.9* 12.1*  HGB 10.8* 9.5* 9.1*  HCT 37.6* 33.1* 31.5*  PLT 317 252 267   Recent Labs  Lab 06/30/19 1455 07/01/19 1922 07/02/19 0329  NA 140 142 138  K 4.4 3.2* 4.8  CL 104 115* 104  CO2 26 17* 23  BUN 14 13 15   CREATININE 1.13 1.72* 1.62*  CALCIUM 10.0 7.5* 8.9  PROT 7.6 5.1*  --   BILITOT 0.7 0.2*  --   ALKPHOS 36* 25*  --   ALT 26 19  --   AST 29 24  --   GLUCOSE 120* 104* 134*   Magnesium-2.1 Troponin -4> less than 2  CT Head Wo Contrast  Result Date: 07/01/2019 CLINICAL DATA:  Minor head trauma with normal mental status. Syncope. EXAM: CT HEAD WITHOUT CONTRAST CT CERVICAL SPINE WITHOUT CONTRAST TECHNIQUE: Multidetector CT imaging of the head and cervical spine was performed following the standard protocol without intravenous contrast. Multiplanar CT image reconstructions of the cervical spine were also generated. COMPARISON:  MR brain dated Sep 13, 2004. CT maxillofacial dated February 27, 2015. FINDINGS: CT HEAD  FINDINGS Brain: Chronic left medial frontal and right inferior frontal encephalomalacia and small left frontal subcortical white matter infarction similar to 2006. No acute intracranial hemorrhage, midline shift, mass effect or abnormal extra-axial fluid collection. Subacute appearing hypoattenuation in the right frontal subcortical white matter, increased in size since 2006. Vascular: No hyperdense vessel. Mild calcified atherosclerosis of the bilateral carotid siphons. Skull: No acute skull fracture, including overlying the frontal sinuses. Stable appearance of the right skull base petrous temporal bone and lesser wing of sphenoid compared to 2016, which may be secondary to demineralization or remote prior trauma. No subgaleal hematoma or scalp contusion. Sinuses/Orbits: Chronic bilateral sphenomaxillary and ethmoidal sinusitis with thickened sinus walls. Remote prior surgical excision of the bilateral medial maxillary walls. Air-fluid level in the left frontal sinus and bilateral frontal sinus mild mucosal thickening. Normal orbital soft tissues. Other: Bilateral recurrent mastoid effusions. Extension of fluid and/or cholesteatoma in both middle ears. CT CERVICAL SPINE FINDINGS Alignment: Reversal of the cervical lordosis. Grade 1 retrolisthesis of C5 on C6, likely degenerative given the hypertrophic endplate changes in this region. Skull base and vertebrae: No vertebral compression fracture or fracture lucency. Normal bilateral craniocervical articulation. Soft tissues and spinal canal: Fatty infiltration of the bilateral parotid glands, commonly seen in the setting of metabolic syndrome. Bilateral internal carotid artery mild calcified atherosclerosis. Disc levels: Moderate disc space narrowing  at C5-6 and C6-7. no traumatic disc space widening or displacement. Upper chest: No apical pneumothorax. A 5 mm subsolid nodule in the right lung apex, series 5, image 97. No follow-up recommended. This recommendation  follows the consensus statement: Guidelines for Management of Incidental Pulmonary Nodules Detected on CT Images: From the Fleischner Society 2017; Radiology 2017; [phone]. Other: A 5 mm benign-density 1,177 Hounsfield unit ovoid sclerotic lesion in the T3 vertebral body, and a second measuring 1,004 19 Hounsfield units and 7 mm in the right C3 pedicle. A periapical lucency at the right mandibular second molar. IMPRESSION: 1. No CT evidence of acute intracranial or cervical spine trauma. 2. 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 3. Chronic paranasal sinus disease with sinus wall thickening and sequela of prior sinus surgery, as well as an acute air-fluid level in the left frontal sinus. 4. Bilateral recurrent or persistent mastoid effusions with fluid and/or cholesteatoma in both middle ears. 5. Stable appearance of the right skull base petrous temporal bone and lesser wing of sphenoid compared to 2016, which may be secondary to trabecular demineralization or remote prior trauma. 6. Sclerotic lesions of the T3 vertebral body and right C3 pedicle with Hounsfield unit measurements consistent with a benign bone islands or treated metastatic disease. 7. Fatty infiltration of the bilateral parotid glands, commonly seen in the setting of metabolic syndrome. 8. Right mandibular second molar periapical lucency. 9. Apical 5 mm subsolid nodule of the right lung apex. No follow-up needed if patient is low-risk. A dedicated non-contrast chest CT can be considered now and again in 12 months if patient is high-risk. This recommendation follows the consensus statement: Guidelines for Management of Incidental Pulmonary Nodules Detected on CT Images: From the Fleischner Society 2017; Radiology 2017; [phone]. Electronically Signed   By: Revonda Humphrey   On: 07/01/2019 21:49   CT Cervical Spine Wo Contrast  Result Date: 07/01/2019 CLINICAL DATA:  Minor head  trauma with normal mental status. Syncope. EXAM: CT HEAD WITHOUT CONTRAST CT CERVICAL SPINE WITHOUT CONTRAST TECHNIQUE: Multidetector CT imaging of the head and cervical spine was performed following the standard protocol without intravenous contrast. Multiplanar CT image reconstructions of the cervical spine were also generated. COMPARISON:  MR brain dated Sep 13, 2004. CT maxillofacial dated February 27, 2015. FINDINGS: CT HEAD FINDINGS Brain: Chronic left medial frontal and right inferior frontal encephalomalacia and small left frontal subcortical white matter infarction similar to 2006. No acute intracranial hemorrhage, midline shift, mass effect or abnormal extra-axial fluid collection. Subacute appearing hypoattenuation in the right frontal subcortical white matter, increased in size since 2006. Vascular: No hyperdense vessel. Mild calcified atherosclerosis of the bilateral carotid siphons. Skull: No acute skull fracture, including overlying the frontal sinuses. Stable appearance of the right skull base petrous temporal bone and lesser wing of sphenoid compared to 2016, which may be secondary to demineralization or remote prior trauma. No subgaleal hematoma or scalp contusion. Sinuses/Orbits: Chronic bilateral sphenomaxillary and ethmoidal sinusitis with thickened sinus walls. Remote prior surgical excision of the bilateral medial maxillary walls. Air-fluid level in the left frontal sinus and bilateral frontal sinus mild mucosal thickening. Normal orbital soft tissues. Other: Bilateral recurrent mastoid effusions. Extension of fluid and/or cholesteatoma in both middle ears. CT CERVICAL SPINE FINDINGS Alignment: Reversal of the cervical lordosis. Grade 1 retrolisthesis of C5 on C6, likely degenerative given the hypertrophic endplate changes in this region. Skull base and vertebrae: No vertebral compression fracture or fracture lucency. Normal  bilateral craniocervical articulation. Soft tissues and spinal canal:  Fatty infiltration of the bilateral parotid glands, commonly seen in the setting of metabolic syndrome. Bilateral internal carotid artery mild calcified atherosclerosis. Disc levels: Moderate disc space narrowing at C5-6 and C6-7. no traumatic disc space widening or displacement. Upper chest: No apical pneumothorax. A 5 mm subsolid nodule in the right lung apex, series 5, image 97. No follow-up recommended. This recommendation follows the consensus statement: Guidelines for Management of Incidental Pulmonary Nodules Detected on CT Images: From the Fleischner Society 2017; Radiology 2017; [phone]. Other: A 5 mm benign-density 1,177 Hounsfield unit ovoid sclerotic lesion in the T3 vertebral body, and a second measuring 1,004 19 Hounsfield units and 7 mm in the right C3 pedicle. A periapical lucency at the right mandibular second molar. IMPRESSION: 1. No CT evidence of acute intracranial or cervical spine trauma. 2. 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 3. Chronic paranasal sinus disease with sinus wall thickening and sequela of prior sinus surgery, as well as an acute air-fluid level in the left frontal sinus. 4. Bilateral recurrent or persistent mastoid effusions with fluid and/or cholesteatoma in both middle ears. 5. Stable appearance of the right skull base petrous temporal bone and lesser wing of sphenoid compared to 2016, which may be secondary to trabecular demineralization or remote prior trauma. 6. Sclerotic lesions of the T3 vertebral body and right C3 pedicle with Hounsfield unit measurements consistent with a benign bone islands or treated metastatic disease. 7. Fatty infiltration of the bilateral parotid glands, commonly seen in the setting of metabolic syndrome. 8. Right mandibular second molar periapical lucency. 9. Apical 5 mm subsolid nodule of the right lung apex. No follow-up needed if patient is low-risk. A dedicated non-contrast  chest CT can be considered now and again in 12 months if patient is high-risk. This recommendation follows the consensus statement: Guidelines for Management of Incidental Pulmonary Nodules Detected on CT Images: From the Fleischner Society 2017; Radiology 2017; [phone]. Electronically Signed   By: Revonda Humphrey   On: 07/01/2019 21:49   DG Chest Portable 1 View  Result Date: 07/01/2019 CLINICAL DATA:  Syncope EXAM: PORTABLE CHEST 1 VIEW COMPARISON:  03/29/2017 chest radiograph. FINDINGS: Stable cardiomediastinal silhouette with normal heart size. No pneumothorax. No pleural effusion. Lungs appear clear, with no acute consolidative airspace disease and no pulmonary edema. IMPRESSION: No active disease. Electronically Signed   By: Ilona Sorrel M.D.   On: 07/01/2019 20:36    Gifford Shave, MD 07/02/2019, 5:48 AM PGY-1, [place] Intern pager: [phone], text pages welcome

## 2019-07-05 ENCOUNTER — Encounter: Payer: Self-pay | Admitting: Family Medicine

## 2019-07-05 ENCOUNTER — Other Ambulatory Visit: Payer: Self-pay

## 2019-07-05 ENCOUNTER — Ambulatory Visit (INDEPENDENT_AMBULATORY_CARE_PROVIDER_SITE_OTHER): Payer: Self-pay | Admitting: Family Medicine

## 2019-07-05 VITALS — BP 132/68 | HR 100 | Wt 157.2 lb

## 2019-07-05 DIAGNOSIS — IMO0002 Reserved for concepts with insufficient information to code with codable children: Secondary | ICD-10-CM

## 2019-07-05 DIAGNOSIS — J449 Chronic obstructive pulmonary disease, unspecified: Secondary | ICD-10-CM

## 2019-07-05 DIAGNOSIS — J4489 Other specified chronic obstructive pulmonary disease: Secondary | ICD-10-CM

## 2019-07-05 DIAGNOSIS — D509 Iron deficiency anemia, unspecified: Secondary | ICD-10-CM

## 2019-07-05 DIAGNOSIS — E1165 Type 2 diabetes mellitus with hyperglycemia: Secondary | ICD-10-CM

## 2019-07-05 DIAGNOSIS — N179 Acute kidney failure, unspecified: Secondary | ICD-10-CM

## 2019-07-05 DIAGNOSIS — I10 Essential (primary) hypertension: Secondary | ICD-10-CM

## 2019-07-05 DIAGNOSIS — R55 Syncope and collapse: Secondary | ICD-10-CM

## 2019-07-05 DIAGNOSIS — I255 Ischemic cardiomyopathy: Secondary | ICD-10-CM

## 2019-07-05 LAB — POCT GLYCOSYLATED HEMOGLOBIN (HGB A1C): HbA1c, POC (controlled diabetic range): 7.3 % — AB (ref 0.0–7.0)

## 2019-07-05 MED ORDER — PREDNISONE 10 MG PO TABS: 10.0000 mg | ORAL_TABLET | Freq: Every day | ORAL | 0 refills | Status: DC

## 2019-07-05 NOTE — Assessment & Plan Note (Signed)
A1c 7.3.  Up a little bit from last check back in December, when it was 6.9.  On metformin 1 g twice daily.  Given his age this is still a good regimen for him.  I will defer the management of this to his PCP, it looks like SGLT2 or GLP-1 have been considered.  Patient has Medicaid by his account, I believe this would open up a lot of avenues for him if he is able to bring in proof of this.

## 2019-07-05 NOTE — Assessment & Plan Note (Signed)
She with recent syncope requiring hospitalization.  Certainly with his history and worsening hypokinesis of his left ventricle this is concerning for cardiac etiology.  He has a follow appointment with his cardiologist in late April.  Did recommend the patient to see if he can get this visit moved up.  Has improved a little bit of his EF since starting the Entresto.  Certainly a potential candidate for a defibrillator as has been discussed.  No arrhythmias captured during admission.

## 2019-07-05 NOTE — Assessment & Plan Note (Addendum)
Currently on Entresto.  Looks like he has bounced between ACE/ARB in the past.  Well-controlled at this visit with BP 132/68.

## 2019-07-05 NOTE — Assessment & Plan Note (Addendum)
Hemoglobin 9.1 on admission.  Looks like the inpatient team considered his iron deficiency and perhaps a minor thalassemia for him.  He was around 12 a couple of years ago, so this is low although the last few checks have been in this range.  I will get a CBC to see how this is progressed with the iron supplementation.

## 2019-07-05 NOTE — Patient Instructions (Addendum)
It was great meeting you today!  I am glad you been feeling a lot better since you left the hospital.  I do think it is a good idea to get some blood work today to check your blood counts, kidney function, and your diabetes.  I make it a big priority to see your pulmonologist and your cardiologist.  I do see that you have a cardiology appointment scheduled, but cannot see the pulmonology appointment in my system.  I would recommend calling to make sure that this is set up.  We will refill your prednisone for a 30-day supply to use you I do see that visualized.  Please be sure to bring by your Medicaid card so that we can status in the system to get your right insurance in your chart.

## 2019-07-05 NOTE — Progress Notes (Signed)
CHIEF COMPLAINT / HPI: 59 year old male who presents for hospital follow-up.  Patient was admitted on 07/01/2019 for syncope.  He was sitting at work when he stood up suddenly, and blacked out.  He states that he did not lose consciousness and was aware of the entire thing but he did fall.  He works as a Merchandiser, retail at [employer].  Fortunately the patient has had no symptoms of syncope since then.  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  This was not discussed with neurology during the admission.  Lab work did show anemia with a hemoglobin of 9.5 and an CV of 65.4.  Of note he had some recent iron lab work showing iron deficiency.  The patient was also found to have an AKI, likely due to poor p.o. intake.  Patient has been having some nausea prior to his admission.   Orthostatics were negative, UA with PT with no issues.  He did have an echocardiogram showing reduced EF of 35 to 40%, moderately decreased left ventricular function.  Severe akinesis of the left ventricle.  Cardiology was not consulted during the admission.  Of note the patient takes [facility] for heart failure.  Has had mildly increased EF since last echo on 04/08/2019, was 25 to 30% at that time.  He has cardiology follow-up on 08/11/2019.  It looks like there were discussions of defibrillator placement at that time.  The patient is asking for refill on prednisone.  He apparently takes 10 mg daily.  States he has been taking this for 12 years.  He takes Dulera, albuterol as needed for mixed COPD/asthma.  He was last seen by pulmonology on 07/13/2018.  He states that he has follow-up with pulmonology in around a month, although I do not see this scheduled.  He sees Dr. Melvyn Novas.  Of note the patient currently has no insurance coverage listed.  He states that he does have Medicaid.  I encouraged him to bring his card by at some point for any of his  visits to get the scan done.  Lastly the patient states that he had trauma as a childhood which required some sort of surgical fixation [place].  He was told by an ear specialist at some point that he had broken bones in his ear and this would need to be fixed surgically.  Further management was deferred due to lack of funds for patient as this was going to cost too much money for him.  Patient believes that this may be related somewhat to his syncope.  PERTINENT  PMH / PSH: As above   OBJECTIVE: BP 132/68   Pulse 100   Wt 157 lb 3.2 oz (71.3 kg)   SpO2 100%   BMI 23.90 kg/m   Gen: Very pleasant 59 year old male, no acute distress HEENT: Oddly shaped ear canals bilaterally.  Appear to taper to a point, which is where he had bilateral myringotomy tubes.  Unclear if this is related to the traumatic injury he described as a child. CV: Regular rate rhythm, no M/R/G Resp: Mild wheezing noted all lobes of the lung bilaterally.  No accessory muscle use, comfortable Neuro: Alert and oriented, Speech clear, No gross deficits   ASSESSMENT / PLAN:  Syncope and collapse Patient doing well from the standpoint.  He has had no further symptoms or episodes of syncope.  The cause is potentially multifactorial given  his anemia, poor p.o. intake, complex cardiac history.  Please see below for further details of management for this individual problems.  COPD with asthma ([place]) Patient apparently takes 10 mg daily of prednisone.  Has backed up to the notes from his pulmonologist Dr. Melvyn Novas.  Last saw them about a year ago.  I refilled his prednisone for 30 tablets today.  He has follow-up apparently scheduled in around a month although I do not see any evidence of this in the chart.  I did encourage the patient to reach out to his pulmonologist to make sure this is the case.  Continue albuterol, continue Dulera.  Uncontrolled type 2 diabetes mellitus, without long-term current use of insulin  (HCC) A1c 7.3.  Up a little bit from last check back in December, when it was 6.9.  On metformin 1 g twice daily.  Given his age this is still a good regimen for him.  I will defer the management of this to his PCP, it looks like SGLT2 or GLP-1 have been considered.  Patient has Medicaid by his account, I believe this would open up a lot of avenues for him if he is able to bring in proof of this.  AKI (acute kidney injury) ([place]) AKI noted on admission.  Creatinine 1.8, only got down to 1.6 prior to discharge.  Baseline appears to be 1.1.  We will recheck this today.  If remains elevated will consider additional work-up based on the results.  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  Looks like the inpatient team considered his iron deficiency and perhaps a minor thalassemia for him.  He was around 12 a couple of years ago, so this is low although the last few checks have been in this range.  I will get a CBC to see how this is progressed with the iron supplementation.  Primary hypertension Currently on Entresto.  Looks like he has bounced between ACE/ARB in the past.  Well-controlled at this visit with BP 132/68.  Cardiomyopathy, ischemic She with recent syncope requiring hospitalization.  Certainly with his history and worsening hypokinesis of his left ventricle this is concerning for cardiac etiology.  He has a follow appointment with his cardiologist in late April.  Did recommend the patient to see if he can get this visit moved up.  Has improved a little bit of his EF since starting the Entresto.  Certainly a potential candidate for a defibrillator as has been discussed.  No arrhythmias captured during admission.     Guadalupe Dawn MD PGY-3 Family Medicine Resident [place]

## 2019-07-05 NOTE — Assessment & Plan Note (Signed)
Patient apparently takes 10 mg daily of prednisone.  Has backed up to the notes from his pulmonologist Dr. Melvyn Novas.  Last saw them about a year ago.  I refilled his prednisone for 30 tablets today.  He has follow-up apparently scheduled in around a month although I do not see any evidence of this in the chart.  I did encourage the patient to reach out to his pulmonologist to make sure this is the case.  Continue albuterol, continue Dulera.

## 2019-07-05 NOTE — Assessment & Plan Note (Signed)
Patient doing well from the standpoint.  He has had no further symptoms or episodes of syncope.  The cause is potentially multifactorial given his anemia, poor p.o. intake, complex cardiac history.  Please see below for further details of management for this individual problems.

## 2019-07-05 NOTE — Assessment & Plan Note (Signed)
AKI noted on admission.  Creatinine 1.8, only got down to 1.6 prior to discharge.  Baseline appears to be 1.1.  We will recheck this today.  If remains elevated will consider additional work-up based on the results.

## 2019-07-06 LAB — BASIC METABOLIC PANEL
BUN/Creatinine Ratio: 16 (ref 10–24)
BUN: 18 mg/dL (ref 8–27)
CO2: 17 mmol/L — ABNORMAL LOW (ref 20–29)
Calcium: 10 mg/dL (ref 8.6–10.2)
Chloride: 102 mmol/L (ref 96–106)
Creatinine, Ser: 1.11 mg/dL (ref 0.76–1.27)
GFR calc Af Amer: 83 mL/min/{1.73_m2} (ref >59)
GFR calc non Af Amer: 72 mL/min/{1.73_m2} (ref >59)
Glucose: 165 mg/dL — ABNORMAL HIGH (ref 65–99)
Potassium: 4.7 mmol/L (ref 3.5–5.2)
Sodium: 139 mmol/L (ref 134–144)

## 2019-07-06 LAB — CBC WITH DIFFERENTIAL/PLATELET
Basophils Absolute: 0.1 10*3/uL (ref 0.0–0.2)
Basos: 1 %
EOS (ABSOLUTE): 0 10*3/uL (ref 0.0–0.4)
Eos: 0 %
Hematocrit: 34.9 % — ABNORMAL LOW (ref 37.5–51.0)
Hemoglobin: 10.1 g/dL — ABNORMAL LOW (ref 13.0–17.7)
Immature Grans (Abs): 0.1 10*3/uL (ref 0.0–0.1)
Immature Granulocytes: 1 %
Lymphocytes Absolute: 1.2 10*3/uL (ref 0.7–3.1)
Lymphs: 11 %
MCH: 18.5 pg — ABNORMAL LOW (ref 26.6–33.0)
MCHC: 28.9 g/dL — ABNORMAL LOW (ref 31.5–35.7)
MCV: 64 fL — ABNORMAL LOW (ref 79–97)
Monocytes Absolute: 0.9 10*3/uL (ref 0.1–0.9)
Monocytes: 8 %
Neutrophils Absolute: 8.9 10*3/uL — ABNORMAL HIGH (ref 1.4–7.0)
Neutrophils: 79 %
Platelets: 309 10*3/uL (ref 150–450)
RBC: 5.46 x10E6/uL (ref 4.14–5.80)
RDW: 20.8 % — ABNORMAL HIGH (ref 11.6–15.4)
WBC: 11.1 10*3/uL — ABNORMAL HIGH (ref 3.4–10.8)

## 2019-07-18 ENCOUNTER — Other Ambulatory Visit: Payer: Self-pay | Admitting: Internal Medicine

## 2019-08-10 NOTE — Progress Notes (Signed)
Chief Complaint  Patient presents with  . Follow-up    CAD     History of Present Illness: 59 yo male with history of CAD, GERD, DM, asthma, hyperlipidemia here today for cardiac follow up. In 2008 he had an anterior MI treated with a drug eluting stent in the LAD. His ejection fraction was 45% at that time. Myoview 2010 showed no ischemia and showed an ejection fraction of 40%. Echo January 2016 with LVEF=40%. Apical hypokinesis. No LV thrombus seen. Echo March 2021 with LVEF=35-40%. He has been on Entresto.   He is here today for follow up. The patient denies any chest pain, dyspnea, palpitations, lower extremity edema, orthopnea, PND, dizziness, near syncope or syncope.   Primary Care Physician: Danna Hefty, DO    Past Medical History:  Diagnosis Date  . Asthma   . CAD (coronary artery disease)    a. CAD s/p anterior MI in 2008 treated with DES to the LAD.  Marland Kitchen Cataract 2018   both eyes  . Cellulitis of leg, right 10/2014  . COPD (chronic obstructive pulmonary disease) ([place])   . Dental caries   . Diabetes mellitus    TYPE 2  . GERD (gastroesophageal reflux disease)   . Hyperlipidemia   . Hypertension 2017  . Ischemic cardiomyopathy   . Leukocytosis    noted on labs  . Microcytic anemia   . Myocardial infarction ([place]) 2008  . Reactive airway disease   . Thyroid nodule    biopsy negative 2006    Past Surgical History:  Procedure Laterality Date  . CORONARY STENT PLACEMENT    . ESOPHAGOGASTRODUODENOSCOPY N/A 03/29/2017   Procedure: ESOPHAGOGASTRODUODENOSCOPY (EGD);  Surgeon: Milus Banister, MD;  Location: [facility] ENDOSCOPY;  Service: Endoscopy;  Laterality: N/A;  . ESOPHAGOGASTRODUODENOSCOPY N/A 03/31/2018   Procedure: ESOPHAGOGASTRODUODENOSCOPY (EGD);  Surgeon: Doran Stabler, MD;  Location: [place];  Service: Gastroenterology;  Laterality: N/A;  . FOREIGN BODY REMOVAL N/A 03/31/2018   Procedure: FOREIGN BODY REMOVAL;  Surgeon: Doran Stabler, MD;   Location: [place];  Service: Gastroenterology;  Laterality: N/A;  . solitary pulm and thryoid nodules      Current Outpatient Medications  Medication Sig Dispense Refill  . acetaminophen (TYLENOL) 500 MG tablet Take 1,000 mg by mouth every 6 (six) hours as needed for headache (pain).    Marland Kitchen albuterol (PROVENTIL HFA;VENTOLIN HFA) 108 (90 Base) MCG/ACT inhaler Inhale 2 puffs into the lungs every 4 (four) hours as needed for wheezing or shortness of breath (((PLAN B))). 3 Inhaler 3  . aspirin EC 81 MG tablet Take 81 mg by mouth daily.    Marland Kitchen atorvastatin (LIPITOR) 40 MG tablet Take 1 tablet (40 mg total) by mouth at bedtime. 30 tablet 11  . calcium-vitamin D (OSCAL WITH D) 250-125 MG-UNIT tablet Take 1 tablet by mouth 2 (two) times daily.    . metFORMIN (GLUCOPHAGE) 1000 MG tablet Take 1 tablet (1,000 mg total) by mouth 2 (two) times daily with a meal. 180 tablet 3  . mometasone-formoterol (DULERA) 200-5 MCG/ACT AERO Inhale 2 puffs into the lungs 2 (two) times daily. 1 Inhaler 0  . Multiple Vitamin (MULTIVITAMIN WITH MINERALS) TABS tablet Take 1 tablet by mouth daily.    . ondansetron (ZOFRAN ODT) 4 MG disintegrating tablet Take 1 tablet (4 mg total) by mouth every 8 (eight) hours as needed for nausea or vomiting. 20 tablet 0  . pantoprazole (PROTONIX) 20 MG tablet Take 1 tablet (20 mg total)  by mouth daily. 30 tablet 0  . predniSONE (DELTASONE) 10 MG tablet Take 1 tablet (10 mg total) by mouth daily with breakfast. 30 tablet 0  . sacubitril-valsartan (ENTRESTO) 97-103 MG Take 1 tablet by mouth 2 (two) times daily. 60 tablet 11  . Ferrous Sulfate (IRON) 325 (65 Fe) MG TABS Take 1 tablet (325 mg total) by mouth in the morning and at bedtime. 60 tablet 1   No current facility-administered medications for this visit.    No Known Allergies  Social History   Socioeconomic History  . Marital status: Married    Spouse name: Not on file  . Number of children: 3  . Years of education: Not on file   . Highest education level: Not on file  Occupational History  . Not on file  Tobacco Use  . Smoking status: Former Smoker    Packs/day: 0.50    Years: 22.00    Pack years: 11.00    Types: Cigarettes    Quit date: 04/21/2006    Years since quitting: 13.3  . Smokeless tobacco: Never Used  . Tobacco comment: quit 10 years ago  Substance and Sexual Activity  . Alcohol use: No    Alcohol/week: 0.0 standard drinks  . Drug use: No  . Sexual activity: Yes    Birth control/protection: Condom  Other Topics Concern  . Not on file  Social History Narrative   Lives with wife; recently moved to [place] from [place] due to health problems. Has no means of affording meds currently + tobacco 1/2ppd x 52yrs, no ETOH no drugs.   Social Determinants of Health   Financial Resource Strain:   . Difficulty of Paying Living Expenses:   Food Insecurity:   . Worried About Charity fundraiser in the Last Year:   . Arboriculturist in the Last Year:   Transportation Needs:   . Film/video editor (Medical):   Marland Kitchen Lack of Transportation (Non-Medical):   Physical Activity:   . Days of Exercise per Week:   . Minutes of Exercise per Session:   Stress:   . Feeling of Stress :   Social Connections:   . Frequency of Communication with Friends and Family:   . Frequency of Social Gatherings with Friends and Family:   . Attends Religious Services:   . Active Member of Clubs or Organizations:   . Attends Archivist Meetings:   Marland Kitchen Marital Status:   Intimate Partner Violence:   . Fear of Current or Ex-Partner:   . Emotionally Abused:   Marland Kitchen Physically Abused:   . Sexually Abused:     Family History  Problem Relation Age of Onset  . Diabetes Mother   . Heart attack Brother 28  . Colon cancer Neg Hx   . Esophageal cancer Neg Hx   . Rectal cancer Neg Hx   . Stomach cancer Neg Hx     Review of Systems:  As stated in the HPI and otherwise negative.   BP 128/64   Pulse 95   Ht 5\' 8"  (1.727 m)   Wt  157 lb (71.2 kg)   SpO2 99%   BMI 23.87 kg/m   Physical Examination: General: Well developed, well nourished, NAD  HEENT: OP clear, mucus membranes moist  SKIN: warm, dry. No rashes. Neuro: No focal deficits  Musculoskeletal: Muscle strength 5/5 all ext  Psychiatric: Mood and affect normal  Neck: No JVD, no carotid bruits, no thyromegaly, no lymphadenopathy.  Lungs:Clear bilaterally,  no wheezes, rhonci, crackles Cardiovascular: Regular rate and rhythm. No murmurs, gallops or rubs. Abdomen:Soft. Bowel sounds present. Non-tender.  Extremities: No lower extremity edema. Pulses are 2 + in the bilateral DP/PT.  Echo January 2016: Left ventricle: The cavity size was normal. Wall thickness was increased in a pattern of mild LVH. There was mild focal basal hypertrophy of the septum. Systolic function was moderately reduced. The estimated ejection fraction was in the range of 35% to 40%. There is akinesis of the anteroseptal and apical myocardium. Doppler parameters are consistent with abnormal left ventricular relaxation (grade 1 diastolic dysfunction).  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 Definity used; no evidence of apical thrombus.  EKG:  EKG is not ordered today. The ekg ordered today demonstrates   Recent Labs: 06/30/2019: TSH 1.165 07/01/2019: ALT 19 07/02/2019: Magnesium 2.1 07/05/2019: BUN 18; Creatinine, Ser 1.11; Hemoglobin 10.1; Platelets 309; Potassium 4.7; Sodium 139   Lipid Panel    Component Value Date/Time   CHOL 99 (L) 04/05/2019 1101   TRIG 72 04/05/2019 1101   HDL 59 04/05/2019 1101   CHOLHDL 1.7 04/05/2019 1101   CHOLHDL 2.0 05/10/2015 0841   VLDL 19 05/10/2015 0841   LDLCALC 25 04/05/2019 1101   LDLDIRECT 91 08/14/2017 1643   LDLDIRECT 98 07/28/2012 1701     Wt Readings from Last 3 Encounters:  08/11/19 157 lb (71.2  kg)  07/05/19 157 lb 3.2 oz (71.3 kg)  05/18/19 164 lb 3.2 oz (74.5 kg)     Other studies Reviewed: Additional studies/ records that were reviewed today include: . Review of the above records demonstrates:    Assessment and Plan:   1. CAD without angina: No chest pain. Continue ASA and statin. No beta blocker due to asthma.      2. Cardiomyopathy, ischemic:  LVEF=40% by echo March 2021. He is not on a beta blocker due to reactive airways disease. Continue Entresto.    3. HTN: BP is well controlled. Continue current therapy  4. Hyperlipidemia: LDL at goal. Continue statin.   Current medicines are reviewed at length with the patient today.  The patient does not have concerns regarding medicines.  The following changes have been made:  no change  Labs/ tests ordered today include:   No orders of the defined types were placed in this encounter.   Disposition:   FU with me in 12  months  Signed, Lauree Chandler, MD 08/11/2019 12:18 PM    [facility] [place], [place], [place]  [address] Phone: [phone]; Fax: [phone]

## 2019-08-11 ENCOUNTER — Encounter: Payer: Self-pay | Admitting: Cardiovascular Disease

## 2019-08-11 ENCOUNTER — Other Ambulatory Visit: Payer: Self-pay

## 2019-08-11 ENCOUNTER — Ambulatory Visit: Payer: Self-pay | Admitting: Cardiovascular Disease

## 2019-08-11 VITALS — BP 128/64 | HR 95 | Ht 68.0 in | Wt 157.0 lb

## 2019-08-11 DIAGNOSIS — I2510 Atherosclerotic heart disease of native coronary artery without angina pectoris: Secondary | ICD-10-CM

## 2019-08-11 DIAGNOSIS — E785 Hyperlipidemia, unspecified: Secondary | ICD-10-CM

## 2019-08-11 DIAGNOSIS — I255 Ischemic cardiomyopathy: Secondary | ICD-10-CM

## 2019-08-11 DIAGNOSIS — I10 Essential (primary) hypertension: Secondary | ICD-10-CM

## 2019-08-11 MED ORDER — ATORVASTATIN CALCIUM 40 MG PO TABS: 40.0000 mg | ORAL_TABLET | Freq: Every day | ORAL | 11 refills | Status: DC

## 2019-08-11 NOTE — Patient Instructions (Signed)
Medication Instructions:  No changes *If you need a refill on your cardiac medications before your next appointment, please call your pharmacy*   Lab Work: None  If you have labs (blood work) drawn today and your tests are completely normal, you will receive your results only by: Marland Kitchen MyChart Message (if you have MyChart) OR . A paper copy in the mail If you have any lab test that is abnormal or we need to change your treatment, we will call you to review the results.   Testing/Procedures: none  Follow-Up: At [facility], you and your health needs are our priority.  As part of our continuing mission to provide you with exceptional heart care, we have created designated Provider Care Teams.  These Care Teams include your primary Cardiologist (physician) and Advanced Practice Providers (APPs -  Physician Assistants and Nurse Practitioners) who all work together to provide you with the care you need, when you need it.  Your next appointment:   12 month(s)  The format for your next appointment:   In Person  Provider:   You may see Lauree Chandler, MD or one of the following Advanced Practice Providers on your designated Care Team:    Melina Copa, PA-C  Ermalinda Barrios, PA-C    Other Instructions

## 2019-08-19 ENCOUNTER — Other Ambulatory Visit: Payer: Self-pay

## 2019-08-19 ENCOUNTER — Ambulatory Visit (INDEPENDENT_AMBULATORY_CARE_PROVIDER_SITE_OTHER): Payer: No Typology Code available for payment source | Admitting: Primary Care

## 2019-08-19 ENCOUNTER — Encounter: Payer: Self-pay | Admitting: Primary Care

## 2019-08-19 VITALS — BP 122/60 | HR 76 | Temp 97.7°F | Ht 68.0 in | Wt 157.2 lb

## 2019-08-19 DIAGNOSIS — K219 Gastro-esophageal reflux disease without esophagitis: Secondary | ICD-10-CM

## 2019-08-19 DIAGNOSIS — J449 Chronic obstructive pulmonary disease, unspecified: Secondary | ICD-10-CM

## 2019-08-19 MED ORDER — MOMETASONE FURO-FORMOTEROL FUM 200-5 MCG/ACT IN AERO: 2.0000 | INHALATION_SPRAY | Freq: Two times a day (BID) | RESPIRATORY_TRACT | 3 refills | Status: DC

## 2019-08-19 MED ORDER — PREDNISONE 10 MG PO TABS: 10.0000 mg | ORAL_TABLET | Freq: Every day | ORAL | 3 refills | Status: DC

## 2019-08-19 NOTE — Progress Notes (Signed)
'@Patient'  ID: [name], male    DOB: 1960-05-28, 59 y.o.   MRN: [id number]  Chief Complaint  Patient presents with  . Follow-up    Referring provider: Danna Hefty, DO  HPI: 59 year old male, former smoker. PMH significant for COPD/asthma. Steriod dependent since 2005. Patient of Dr. Melvyn Novas, last seen March 2020. Maintained on Dulera and prednisone 60m daily (floor 532m ceiling 1067m 08/19/2019 Patient presents today for regular follow-up. He has been doing well. States that his  breathing at his baseline. He continues to use Dulera 200 twice daily and is on 67m51mednisone currently. States that he has tried to decreased prednisone dose twice to 5mg 60m experiences increased wheezing. He was having to use his albuterol rescue inhaler last month twice a week at night d/t wheezing. This month he has not had to use his rescue inhaler. He is not currently on lisinopril.  No acute shortness of breath, cough or wheezing. Denies chest pain or GERD.   No Known Allergies  Immunization History  Administered Date(s) Administered  . Hepatitis A, Adult 03/21/2015  . Hepatitis B, adult 03/21/2015  . Influenza Split 03/21/2011, 12/21/2011, 01/19/2013, 01/20/2015  . Influenza Whole 01/20/2008, 01/30/2009  . Influenza,inj,Quad PF,6+ Mos 12/21/2015, 05/06/2016, 01/05/2018, 04/27/2019  . MMR 03/21/2015  . Meningococcal Mcv4o 05/06/2016  . Pneumococcal Polysaccharide-23 01/30/2009, 08/14/2017  . Td 05/27/2004  . Tdap 02/27/2015    Past Medical History:  Diagnosis Date  . Asthma   . CAD (coronary artery disease)    a. CAD s/p anterior MI in 2008 treated with DES to the LAD.  . CatMarland Kitchenract 2018   both eyes  . Cellulitis of leg, right 10/2014  . COPD (chronic obstructive pulmonary disease) (HCC) [place] Dental caries   . Diabetes mellitus    TYPE 2  . GERD (gastroesophageal reflux disease)   . Hyperlipidemia   . Hypertension 2017  . Ischemic cardiomyopathy   . Leukocytosis    noted on  labs  . Microcytic anemia   . Myocardial infarction (HCC) [place]8  . Reactive airway disease   . Thyroid nodule    biopsy negative 2006    Tobacco History: Social History   Tobacco Use  Smoking Status Former Smoker  . Packs/day: 0.50  . Years: 22.00  . Pack years: 11.00  . Types: Cigarettes  . Quit date: 04/21/2006  . Years since quitting: 13.3  Smokeless Tobacco Never Used  Tobacco Comment   quit 10 years ago   Counseling given: Not Answered Comment: quit 10 years ago   Outpatient Medications Prior to Visit  Medication Sig Dispense Refill  . acetaminophen (TYLENOL) 500 MG tablet Take 1,000 mg by mouth every 6 (six) hours as needed for headache (pain).    . albMarland Kitchenterol (PROVENTIL HFA;VENTOLIN HFA) 108 (90 Base) MCG/ACT inhaler Inhale 2 puffs into the lungs every 4 (four) hours as needed for wheezing or shortness of breath (((PLAN B))). 3 Inhaler 3  . aspirin EC 81 MG tablet Take 81 mg by mouth daily.    . atoMarland Kitchenvastatin (LIPITOR) 40 MG tablet Take 1 tablet (40 mg total) by mouth at bedtime. 30 tablet 11  . calcium-vitamin D (OSCAL WITH D) 250-125 MG-UNIT tablet Take 1 tablet by mouth 2 (two) times daily.    . metFORMIN (GLUCOPHAGE) 1000 MG tablet Take 1 tablet (1,000 mg total) by mouth 2 (two) times daily with a meal. 180 tablet 3  . mometasone-formoterol (DULERA) 200-5 MCG/ACT AERO Inhale 2 puffs  into the lungs 2 (two) times daily. 1 Inhaler 0  . Multiple Vitamin (MULTIVITAMIN WITH MINERALS) TABS tablet Take 1 tablet by mouth daily.    . ondansetron (ZOFRAN ODT) 4 MG disintegrating tablet Take 1 tablet (4 mg total) by mouth every 8 (eight) hours as needed for nausea or vomiting. 20 tablet 0  . pantoprazole (PROTONIX) 20 MG tablet Take 1 tablet (20 mg total) by mouth daily. 30 tablet 0  . sacubitril-valsartan (ENTRESTO) 97-103 MG Take 1 tablet by mouth 2 (two) times daily. 60 tablet 11  . predniSONE (DELTASONE) 10 MG tablet Take 1 tablet (10 mg total) by mouth daily with breakfast. 30  tablet 0  . Ferrous Sulfate (IRON) 325 (65 Fe) MG TABS Take 1 tablet (325 mg total) by mouth in the morning and at bedtime. 60 tablet 1   No facility-administered medications prior to visit.    Review of Systems  Review of Systems  Respiratory: Positive for wheezing. Negative for cough and shortness of breath.   Cardiovascular: Negative.     Physical Exam  BP 122/60 (BP Location: Right Arm, Cuff Size: Normal)   Pulse 76   Temp 97.7 F (36.5 C) (Temporal)   Ht '5\' 8"'  (1.727 m)   Wt 157 lb 3.2 oz (71.3 kg)   SpO2 98%   BMI 23.90 kg/m  Physical Exam Constitutional:      Appearance: Normal appearance.  Cardiovascular:     Rate and Rhythm: Normal rate and regular rhythm.  Pulmonary:     Effort: Pulmonary effort is normal.     Breath sounds: Normal breath sounds.     Comments: Dull insp wheeze Neurological:     Mental Status: He is alert.  Psychiatric:        Mood and Affect: Mood normal.        Behavior: Behavior normal.        Thought Content: Thought content normal.        Judgment: Judgment normal.      Lab Results:  CBC    Component Value Date/Time   WBC 11.1 (H) 07/05/2019 1219   WBC 12.1 (H) 07/02/2019 0329   RBC 5.46 07/05/2019 1219   RBC 4.90 07/02/2019 0329   HGB 10.1 (L) 07/05/2019 1219   HGB 13.3 07/05/2014 1431   HCT 34.9 (L) 07/05/2019 1219   HCT 40.1 07/05/2014 1431   PLT 309 07/05/2019 1219   MCV 64 (L) 07/05/2019 1219   MCV 72 (L) 07/05/2014 1431   MCH 18.5 (L) 07/05/2019 1219   MCH 18.6 (L) 07/02/2019 0329   MCHC 28.9 (L) 07/05/2019 1219   MCHC 28.9 (L) 07/02/2019 0329   RDW 20.8 (H) 07/05/2019 1219   RDW 14.4 07/05/2014 1431   LYMPHSABS 1.2 07/05/2019 1219   LYMPHSABS 1.3 07/05/2014 1431   MONOABS 1.0 06/30/2019 1455   EOSABS 0.0 07/05/2019 1219   EOSABS 0.1 07/05/2014 1431   BASOSABS 0.1 07/05/2019 1219   BASOSABS 0.1 07/05/2014 1431    BMET    Component Value Date/Time   NA 139 07/05/2019 1219   K 4.7 07/05/2019 1219   CL  102 07/05/2019 1219   CO2 17 (L) 07/05/2019 1219   GLUCOSE 165 (H) 07/05/2019 1219   GLUCOSE 134 (H) 07/02/2019 0329   BUN 18 07/05/2019 1219   CREATININE 1.11 07/05/2019 1219   CREATININE 0.98 09/17/2012 1602   CALCIUM 10.0 07/05/2019 1219   GFRNONAA 72 07/05/2019 1219   GFRAA 83 07/05/2019 1219    BNP  No results found for: BNP  ProBNP    Component Value Date/Time   PROBNP 131.0 (H) 02/20/2007 0425    Imaging: No results found.   Assessment & Plan:   COPD with asthma ([place]) - Stable; Breathing baseline, increased SABA use in April d/t seasonal allergies. Wheezing worse on 50m prednisone, controlled on 170mdaily. Continue Dulera 200 two puffs twice daily. Refer to pharmacy for PA.   GERD (gastroesophageal reflux disease) - Improved, no current cough or chest pain - Continue PPI    ElMartyn EhrichNP 08/20/2019

## 2019-08-19 NOTE — Patient Instructions (Addendum)
COPD/asthma: Refill Dulera Refill prednisone   Follow-up: 6 months with Dr. Melvyn Novas  Pharmacy for PA for [facility]

## 2019-08-20 ENCOUNTER — Encounter: Payer: Self-pay | Admitting: Primary Care

## 2019-08-20 NOTE — Assessment & Plan Note (Addendum)
-   Stable; Breathing baseline, increased SABA use in April d/t seasonal allergies. Wheezing worse on 5mg  prednisone, controlled on 10mg  daily. Continue Dulera 200 two puffs twice daily. Refer to pharmacy for PA.

## 2019-08-20 NOTE — Assessment & Plan Note (Signed)
-   Improved, no current cough or chest pain - Continue PPI

## 2019-09-06 ENCOUNTER — Other Ambulatory Visit: Payer: Self-pay

## 2019-09-06 ENCOUNTER — Ambulatory Visit (INDEPENDENT_AMBULATORY_CARE_PROVIDER_SITE_OTHER): Payer: No Typology Code available for payment source | Admitting: Pharmacist

## 2019-09-06 DIAGNOSIS — Z599 Problem related to housing and economic circumstances, unspecified: Secondary | ICD-10-CM

## 2019-09-06 DIAGNOSIS — Z7189 Other specified counseling: Secondary | ICD-10-CM

## 2019-09-06 DIAGNOSIS — J449 Chronic obstructive pulmonary disease, unspecified: Secondary | ICD-10-CM

## 2019-09-06 NOTE — Addendum Note (Signed)
Addended by: Mariella Saa C on: 09/06/2019 03:12 PM   Modules accepted: Level of Service

## 2019-09-06 NOTE — Progress Notes (Signed)
HPI Patient presents today to [facility] to see pharmacy team to discuss inhaler options.  Past medical history includes COPD, asthma, CAD, HTN, GERD, DM2,and history of MI.  He is currently receiving Dulera through patient assistance and brought paperwork to re-enroll.    Respiratory Medications Current: Dulera and albuterol prn Tried in past: Qvar, Flovent, Advair, Spiriva Respimat, Spiriva Handihaler Patient reports no known adherence challenges  OBJECTIVE No Known Allergies  Outpatient Encounter Medications as of 09/06/2019  Medication Sig Note  . albuterol (PROVENTIL HFA;VENTOLIN HFA) 108 (90 Base) MCG/ACT inhaler Inhale 2 puffs into the lungs every 4 (four) hours as needed for wheezing or shortness of breath (((PLAN B))). 09/06/2019: Used once in the last 2 weeks.  Marland Kitchen aspirin EC 81 MG tablet Take 81 mg by mouth daily.   Marland Kitchen atorvastatin (LIPITOR) 40 MG tablet Take 1 tablet (40 mg total) by mouth at bedtime.   . calcium-vitamin D (OSCAL WITH D) 250-125 MG-UNIT tablet Take 1 tablet by mouth 2 (two) times daily.   . metFORMIN (GLUCOPHAGE) 1000 MG tablet Take 1 tablet (1,000 mg total) by mouth 2 (two) times daily with a meal.   . mometasone-formoterol (DULERA) 200-5 MCG/ACT AERO Inhale 2 puffs into the lungs in the morning and at bedtime.   . Multiple Vitamin (MULTIVITAMIN WITH MINERALS) TABS tablet Take 1 tablet by mouth daily.   . pantoprazole (PROTONIX) 20 MG tablet Take 1 tablet (20 mg total) by mouth daily.   . predniSONE (DELTASONE) 10 MG tablet Take 1 tablet (10 mg total) by mouth daily with breakfast.   . sacubitril-valsartan (ENTRESTO) 97-103 MG Take 1 tablet by mouth 2 (two) times daily.   Marland Kitchen acetaminophen (TYLENOL) 500 MG tablet Take 1,000 mg by mouth every 6 (six) hours as needed for headache (pain).   . Ferrous Sulfate (IRON) 325 (65 Fe) MG TABS Take 1 tablet (325 mg total) by mouth in the morning and at bedtime. (Patient taking differently: Take 325 mg by mouth daily. )     . [DISCONTINUED] mometasone-formoterol (DULERA) 200-5 MCG/ACT AERO Inhale 2 puffs into the lungs 2 (two) times daily.   . [DISCONTINUED] ondansetron (ZOFRAN ODT) 4 MG disintegrating tablet Take 1 tablet (4 mg total) by mouth every 8 (eight) hours as needed for nausea or vomiting. (Patient not taking: Reported on 09/06/2019)    No facility-administered encounter medications on file as of 09/06/2019.     Immunization History  Administered Date(s) Administered  . Hepatitis A, Adult 03/21/2015  . Hepatitis B, adult 03/21/2015  . Influenza Split 03/21/2011, 12/21/2011, 01/19/2013, 01/20/2015  . Influenza Whole 01/20/2008, 01/30/2009  . Influenza,inj,Quad PF,6+ Mos 12/21/2015, 05/06/2016, 01/05/2018, 04/27/2019  . MMR 03/21/2015  . Meningococcal Mcv4o 05/06/2016  . Pneumococcal Polysaccharide-23 01/30/2009, 08/14/2017  . Td 05/27/2004  . Tdap 02/27/2015     Assessment   1. Inhaler Optimization  Patient is currently on Dulera and feels like it is adequately controlling his symptoms.  He brought paperwork in today to reapply for [facility] patient assistance [employer]. He currently has 3 months worth of Dulera left. Believe they are no longer offering patient assistance for [facility].  We will submit and notify patient if no longer offering patient assistance.  Discussed alternative inhalers.  Patient states he has used Advair in the past with adequate response.  Patient filled out paperwork for Advair patient assistance through [place].  We will submit in the event he is no longer able to continue with [facility] patient assistance  Inspiratory flow measured  using the [place] and was in range of 30-90 for use of medium-low resistance DPI device (Ellipta, Diskus). Optimal range is greater than 60 L/min.  Patient scored 65 after coaching.  Patient is a good candidate for Advair if needed in the future.  Patient also states he has difficulty affording his rescue inhaler.  Patient filled out paperwork for  proair patient assistance through [facility] [facility].  Will fax for approval.  Patient was counseled on the purpose, proper use, and adverse effects of Dulera inhaler.  Instructed patient to rinse mouth with water after using in order to prevent fungal infection.  Patient verbalized understanding.  Reviewed appropriate use of maintenance vs rescue inhalers.  Stressed importance of using maintenance inhaler daily and rescue inhaler only as needed.  Patient verbalized understanding.  Demonstrated proper inhaler technique using demo inhaler.  Patient able to demonstrate proper inhaler technique using teach back method.   2. Medication Reconciliation  A drug regimen assessment was performed, including review of allergies, interactions, disease-state management, dosing and immunization history. Medications were reviewed with the patient, including name, instructions, indication, goals of therapy, potential side effects, importance of adherence, and safe use.  Drug interaction(s): no major drug interactions identified  3. Immunizations  Patient is up to date with annual influenzae and Pneumovax 23.    PLAN  Continue Dulera 2 puffs twice a day  Apply for Advair assistance through [place]  If program for [employer] is no longer available   Apply for Proair patient assistance through [place]  All questions encouraged and answered.  Instructed patient to reach out with any further questions or concerns.  Thank you for allowing pharmacy to participate in this patient's care.  This appointment required 45 minutes of patient care (this includes precharting, chart review, review of results, face-to-face care, etc.).   Mariella Saa, PharmD, [place], [place] [facility] Pharmacist [phone]  09/06/2019 3:10 PM

## 2019-09-06 NOTE — Patient Instructions (Addendum)
Thank you for meeting with the pharmacy team today!  Below is a recap of what we discussed today:  Continue Dulera 2 puffs twice a day  Apply for Advair assistance through [place] [phone])  If program for [facility] is no longer available through [employer] [phone])  Apply for [employer] assistance  through [facility] [phone])   Recommend Shingrix and Covid-19 vaccines  Please call the programs at the numbers listed above for updates about status of application.

## 2019-12-02 ENCOUNTER — Telehealth: Payer: Self-pay

## 2019-12-02 NOTE — Telephone Encounter (Signed)
Called and scheduled follow up appointment with patient.  Inquired about metformin. Patient states that he is still taking metformin but that when he went to [place] he was not given metformin.  However, he did get all of his other medications.  Patient is confused as to why he can not get his medication.  Informed patient that he should call [facility] and see if his RX is ready for pick up because Dr. Tarry Kos wants him to continue on it.  Patient agreed and will call and let [facility] know if there is a problem.  Ozella Almond, [place]

## 2019-12-02 NOTE — Telephone Encounter (Signed)
Please have patient schedule a follow up appointment at earliest convenience for check up and diabetes follow up. Thank you.

## 2019-12-02 NOTE — Telephone Encounter (Signed)
Please inform pharmacist that patient should be on this medication. A call to the patient may be beneficial for him as a reminder.

## 2019-12-02 NOTE — Telephone Encounter (Signed)
Received phone call from Butch Penny at [facility]. Reports that patient has not picked up refill on metformin since January 2021. Pharmacist wants to clarify if patient should still receive refill as he has not been compliant on medication regimen.   To PCP  Talbot Grumbling, RN

## 2019-12-05 ENCOUNTER — Other Ambulatory Visit: Payer: Self-pay | Admitting: Family Medicine

## 2019-12-05 DIAGNOSIS — IMO0002 Reserved for concepts with insufficient information to code with codable children: Secondary | ICD-10-CM

## 2019-12-05 DIAGNOSIS — E1165 Type 2 diabetes mellitus with hyperglycemia: Secondary | ICD-10-CM

## 2019-12-28 ENCOUNTER — Telehealth: Payer: Self-pay | Admitting: Primary Care

## 2019-12-28 MED ORDER — ADVAIR HFA 230-21 MCG/ACT IN AERO: 2.0000 | INHALATION_SPRAY | Freq: Two times a day (BID) | RESPIRATORY_TRACT | 5 refills | Status: DC

## 2019-12-28 NOTE — Telephone Encounter (Signed)
Patient came and signed application.  Submitted Patient Assistance Application to [place] for Advair HFA along  income documents. Will update patient when we receive a response.  Fax# [phone] Phone# [phone]

## 2019-12-28 NOTE — Telephone Encounter (Signed)
Rx for Advair has been signed by [place] and handed to pharmacy team.

## 2019-12-28 NOTE — Telephone Encounter (Signed)
Thank you :)

## 2019-12-28 NOTE — Telephone Encounter (Signed)
Merchant navy officer. Discontinued Dulera, change to Advair hfa 230-21 mcg two puffs twice daily.

## 2019-12-28 NOTE — Telephone Encounter (Signed)
Spoke to patient, He advised that [employer] is no longer carrying Dulera for patient assistance. He met with pharmacist previously, who discussed switching to Advair and apply through [employer] PAP if this happened. Did not see any [employer] applications in his chart. Filled out new application and patient will come by to sign. ° °Will need a printed prescription for Advair (not sure of dosage) to go with patient's application. Please place in pharmacy box. ° °Application will be placed up front for patient to stop by to sign. ° °Thanks! °Rachael N Perry, CPhT ° °

## 2019-12-29 ENCOUNTER — Other Ambulatory Visit: Payer: Self-pay | Admitting: Family Medicine

## 2019-12-29 DIAGNOSIS — IMO0002 Reserved for concepts with insufficient information to code with codable children: Secondary | ICD-10-CM

## 2020-01-03 NOTE — Telephone Encounter (Signed)
Received a fax from  [place] regarding an approval for [employer] patient assistance from 12/29/19 to 12/27/20.    Phone [place] patient to advise, left message.

## 2020-01-10 ENCOUNTER — Ambulatory Visit (INDEPENDENT_AMBULATORY_CARE_PROVIDER_SITE_OTHER): Payer: No Typology Code available for payment source | Admitting: Family Medicine

## 2020-01-10 ENCOUNTER — Other Ambulatory Visit: Payer: Self-pay

## 2020-01-10 ENCOUNTER — Ambulatory Visit: Payer: No Typology Code available for payment source | Admitting: Family Medicine

## 2020-01-10 VITALS — BP 120/80 | HR 105 | Wt 156.2 lb

## 2020-01-10 DIAGNOSIS — D509 Iron deficiency anemia, unspecified: Secondary | ICD-10-CM

## 2020-01-10 DIAGNOSIS — I10 Essential (primary) hypertension: Secondary | ICD-10-CM

## 2020-01-10 DIAGNOSIS — I2510 Atherosclerotic heart disease of native coronary artery without angina pectoris: Secondary | ICD-10-CM

## 2020-01-10 DIAGNOSIS — Z87891 Personal history of nicotine dependence: Secondary | ICD-10-CM

## 2020-01-10 DIAGNOSIS — E782 Mixed hyperlipidemia: Secondary | ICD-10-CM

## 2020-01-10 DIAGNOSIS — IMO0002 Reserved for concepts with insufficient information to code with codable children: Secondary | ICD-10-CM

## 2020-01-10 DIAGNOSIS — E1165 Type 2 diabetes mellitus with hyperglycemia: Secondary | ICD-10-CM

## 2020-01-10 DIAGNOSIS — Z23 Encounter for immunization: Secondary | ICD-10-CM

## 2020-01-10 LAB — POCT GLYCOSYLATED HEMOGLOBIN (HGB A1C): HbA1c, POC (controlled diabetic range): 8 % — AB (ref 0.0–7.0)

## 2020-01-10 MED ORDER — IRON 325 (65 FE) MG PO TABS: 325.0000 mg | ORAL_TABLET | Freq: Every day | ORAL | Status: DC

## 2020-01-10 MED ORDER — METFORMIN HCL 1000 MG PO TABS: 1000.0000 mg | ORAL_TABLET | Freq: Two times a day (BID) | ORAL | 1 refills | Status: DC

## 2020-01-10 NOTE — Progress Notes (Signed)
Subjective:   Patient ID: [name]    DOB: February 28, 1961, 59 y.o. male   MRN: [id number]  Reinhard Schack is a 59 y.o. male with a history of atherosclerotic disease, cardiomyopathy, HTN, h/o syncope, COPD, chronic sinusitis, GERD, uncontrolled T2DM, h/o MI, microcytic anemia here for diabetes check up.  Diabetes: Last three A1C's below. Currently on Metformin 1000mg  BID. Endorses compliance. Denies any polyuria, polydipsia, polyphagia. Last BMP with Cr 1.11 which is his baseline. Due for diabetic eye exam.  Lab Results  Component Value Date   HGBA1C 8.0 (A) 01/10/2020   HGBA1C 7.3 (A) 07/05/2019   HGBA1C 6.9 (H) 04/05/2019    HTN:  BP: 120/80 today. Currently on Entresto 97-103mg  BID. Endorses compliance. Former smoker (1 PPD x 18 years, quit in 2008). Denies any chest pain, SOB, vision changes, or headaches.    HLD: Last lipid panel below. Currently on Lipitor 40mg  QD. Endorses compliance. Denies any muscles aches or weakness.  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 Colonoscopy and endoscopy in 2019/2020 with several polyps that were found to be benign and "pre-cancerous" polyps. Recommended to have repeat colonoscopy in 3 years.  Health Maintenance: Due for COVID vaccine, diabetic eye exam, and flu vaccine  Review of Systems:  Per HPI.   Objective:   BP 120/80   Pulse (!) 105   Wt 156 lb 3.2 oz (70.9 kg)   SpO2 98%   BMI 23.75 kg/m  Vitals and nursing note reviewed.  General: pleasant older male, sitting comfortably in exam chair, well nourished, well developed, in no acute distress with non-toxic appearance HEENT: normocephalic, atraumatic, moist mucous membranes, oropharynx clear without erythema or  exudate, TM normal bilaterally  Neck: supple, normal ROM CV: regular rate and rhythm without murmurs, rubs, or gallops, no lower extremity edema, 2+ radial and pedal pulses bilaterally Lungs: clear to auscultation bilaterally with normal work of breathing on room air Resp: breathing comfortably on room air, speaking in full sentences Abdomen: soft, non-tender Skin: warm, dry, no rashes or lesions Extremities: warm and well perfused, normal tone MSK: gait normal Neuro: Alert and oriented, speech normal  Assessment & Plan:   Uncontrolled type 2 diabetes mellitus, without long-term current use of insulin (HCC) Chronic, uncontrolled. A1C worsening from 7.3 to 8.0. Endorses compliant with Metformin 1000mg  BID. Patient would benefit from an additional agent, however he has [employer] thus unsure what can be offered. Will reach out to our pharmacy team to further evaluate. Will discuss with patient once determined. - Discussed healthy eating, portion control, and limiting carbohydrates.  -Continue Metformin 1000 mg twice daily -Instructed to schedule diabetic eye exam at earliest convenience.  Referral placed to ophthalmology -Likely add additional agent once determined an affordable option.  Will discuss with pharmacy team - follow up in 3 months -Stable kidney function per BMP in March 2021 -Former smoker, currently on statin     Primary hypertension Chronic, well controlled.  Tolerating Entresto without complication. -Continue Entresto as prescribed -Former smoker  Mixed hyperlipidemia Chronic, well controlled on Lipitor 40mg  QD. 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 Endorses compliance with iron supplement. Refill provided.  -  Repeat colonoscopy in 3 years  Atherosclerosis of native coronary artery of native heart Denies any chest pain, SOB - continue ASA and Statin as prescribed  Former  smoker Former smoker of 1 PPD x 18 years, quit in 2008.  (18 pack year history). Does not qualify for lung cancer screen at this time.   Health Maintenance: Patient has been counseled on and encouraged to received the COVID-19 vaccination. All questions were answered.  Flu vaccine and COVID vaccine given today.  Patient was monitored for 15 minutes after vaccine administration without any adverse reactions experienced.    Orders Placed This Encounter  Procedures  . Flu Vaccine QUAD 36+ mos IM  . Lipid Panel  . CBC  . Ambulatory referral to Ophthalmology    Referral Priority:   Routine    Referral Type:   Consultation    Referral Reason:   Specialty Services Required    Requested Specialty:   Ophthalmology    Number of Visits Requested:   1  . POCT glycosylated hemoglobin (Hb A1C)   Meds ordered this encounter  Medications  . Ferrous Sulfate (IRON) 325 (65 Fe) MG TABS    Sig: Take 1 tablet (325 mg total) by mouth daily.  . metFORMIN (GLUCOPHAGE) 1000 MG tablet    Sig: Take 1 tablet (1,000 mg total) by mouth 2 (two) times daily with a meal.    Dispense:  180 tablet    Refill:  [place], DO PGY-3, [facility] 01/13/2020 9:22 AM

## 2020-01-11 LAB — LIPID PANEL
Chol/HDL Ratio: 2.3 ratio (ref 0.0–5.0)
Cholesterol, Total: 129 mg/dL (ref 100–199)
HDL: 57 mg/dL (ref >39)
LDL Chol Calc (NIH): 38 mg/dL (ref 0–99)
Triglycerides: 222 mg/dL — ABNORMAL HIGH (ref 0–149)
VLDL Cholesterol Cal: 34 mg/dL (ref 5–40)

## 2020-01-11 LAB — CBC
Hematocrit: 35.7 % — ABNORMAL LOW (ref 37.5–51.0)
Hemoglobin: 10.3 g/dL — ABNORMAL LOW (ref 13.0–17.7)
MCH: 18.2 pg — ABNORMAL LOW (ref 26.6–33.0)
MCHC: 28.9 g/dL — ABNORMAL LOW (ref 31.5–35.7)
MCV: 63 fL — ABNORMAL LOW (ref 79–97)
Platelets: 280 10*3/uL (ref 150–450)
RBC: 5.67 x10E6/uL (ref 4.14–5.80)
RDW: 19.7 % — ABNORMAL HIGH (ref 11.6–15.4)
WBC: 14.5 10*3/uL — ABNORMAL HIGH (ref 3.4–10.8)

## 2020-01-13 ENCOUNTER — Other Ambulatory Visit: Payer: Self-pay

## 2020-01-13 DIAGNOSIS — Z87891 Personal history of nicotine dependence: Secondary | ICD-10-CM | POA: Insufficient documentation

## 2020-01-13 MED ORDER — ENTRESTO 97-103 MG PO TABS: 1.0000 | ORAL_TABLET | Freq: Two times a day (BID) | ORAL | 1 refills | Status: DC

## 2020-01-13 NOTE — Assessment & Plan Note (Signed)
Denies any chest pain, SOB - continue ASA and Statin as prescribed

## 2020-01-13 NOTE — Assessment & Plan Note (Signed)
Hemoglobin stable at 10.3. Endorses compliance with iron supplement. Refill provided.  - Repeat colonoscopy in 3 years

## 2020-01-13 NOTE — Addendum Note (Signed)
Addended by: Carter Kitten D on: 01/13/2020 09:35 AM   Modules accepted: Orders

## 2020-01-13 NOTE — Assessment & Plan Note (Signed)
Former smoker of 1 PPD x 18 years, quit in 2008.  (18 pack year history). Does not qualify for lung cancer screen at this time.

## 2020-01-13 NOTE — Patient Instructions (Signed)
Thank you for coming to see me today. It was a pleasure to see you.   I am so glad you got your Covid vaccine and your flu shot! Please follow-up as scheduled for your second dose.  Please continue Metformin as prescribed I will call you after discussing with our pharmacy team concerning additional diabetes agents to add   We are checking some labs today, I will call you if they are abnormal will send you a MyChart message or a letter if they are normal.  If you do not hear about your labs in the next 2 weeks please let us know.  Please follow-up with me in 3 months.  If you have any questions or concerns, please do not hesitate to call the office at [phone].  Take Care,  Dr. Mina Marble, DO Resident Physician [place] [phone]

## 2020-01-13 NOTE — Assessment & Plan Note (Addendum)
Chronic, uncontrolled. A1C worsening from 7.3 to 8.0. Endorses compliant with Metformin 1000mg  BID. Patient would benefit from an additional agent, however he has [employer] thus unsure what can be offered. Will reach out to our pharmacy team to further evaluate. Will discuss with patient once determined. - Discussed healthy eating, portion control, and limiting carbohydrates.  -Continue Metformin 1000 mg twice daily -Instructed to schedule diabetic eye exam at earliest convenience.  Referral placed to ophthalmology -Likely add additional agent once determined an affordable option.  Will discuss with pharmacy team - follow up in 3 months -Stable kidney function per BMP in March 2021 -Former smoker, currently on statin

## 2020-01-13 NOTE — Assessment & Plan Note (Signed)
Chronic, well controlled.  Tolerating Entresto without complication. -Continue Entresto as prescribed -Former smoker

## 2020-01-13 NOTE — Assessment & Plan Note (Signed)
Chronic, well controlled on Lipitor 40mg  QD. - continue Lipitor 40mg  QD

## 2020-01-16 ENCOUNTER — Other Ambulatory Visit: Payer: Self-pay

## 2020-01-16 ENCOUNTER — Ambulatory Visit: Payer: No Typology Code available for payment source | Admitting: Internal Medicine

## 2020-01-16 ENCOUNTER — Encounter: Payer: Self-pay | Admitting: Internal Medicine

## 2020-01-16 DIAGNOSIS — J449 Chronic obstructive pulmonary disease, unspecified: Secondary | ICD-10-CM

## 2020-01-16 MED ORDER — ALBUTEROL SULFATE HFA 108 (90 BASE) MCG/ACT IN AERS: 2.0000 | INHALATION_SPRAY | RESPIRATORY_TRACT | 1 refills | Status: DC | PRN

## 2020-01-16 MED ORDER — PREDNISONE 10 MG PO TABS: 10.0000 mg | ORAL_TABLET | Freq: Every day | ORAL | 3 refills | Status: DC

## 2020-01-16 NOTE — Patient Instructions (Addendum)
Try taking dulera in pm 15 min before your evening walk to see if reduces your need for albuterol   Only use your albuterol as a rescue medication to be used if you can't catch your breath by resting or doing a relaxed purse lip breathing pattern.  - The less you use it, the better it will work when you need it. - Ok to use up to 2 puffs  every 4 hours if you must but call for immediate appointment if use goes up over your usual need - Don't leave home without it !!  (think of it like the spare tire for your car)    Ok with using a floor dose of presnisone of 10 mg and a ceiling of 20 mg    Please schedule a follow up visit in 6  months but call sooner if needed
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
## 2020-01-17 ENCOUNTER — Encounter: Payer: Self-pay | Admitting: Internal Medicine

## 2020-01-17 NOTE — Assessment & Plan Note (Addendum)
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 2008  - PFT's 2007  FEV1  1.78 (50%) ratio 52 and 19% resp to saba, dlco 75% - d/c coreg 09/03/2013  - spirometry 12/18/2014 >  FEV1 1.04 (34%) ratio 53     > trial of dulera 200 2bid and prednisone 5 mg daily   - Spiriva respimat added 05/01/2015  - max gerd rx1/01/2016 > improved 06/12/2015   - FENO 12/21/2015  =   9  p am symb 160 rx - Spirometry 12/21/2015  FEV1 1.33 (47%)  Ratio 53 p am symb 160/spiriva  - Spirometry 06/12/2017  FEV1 1.43 (49%)  Ratio 51 p dulera and off spiriva xone week so leave off spriva - 07/13/2018  After extensive coaching inhaler device,  effectiveness =    90% with hfa > continue dulera 200 2bid and reduce pred floor to 5 mg  08/19/2019 Stable follow-up. Off ACEi, improved cough/chest pain. Continue Dulera 200 and prednisone10mg  (wheezing worse on 5mg )  ACOS pattern remains pred dependent with floor of 10 and ceiling of 20 mg on dulera 200 2bid though using saba too much at baseline  Advised: I spent extra time with pt today reviewing appropriate use of albuterol for prn use on exertion with the following points: 1) saba is for relief of sob that does not improve by walking a slower pace or resting but rather if the pt does not improve after trying this first. 2) If the pt is convinced, as many are, that saba helps recover from activity faster then it's easy to tell if this is the case by re-challenging : ie stop, take the inhaler, then p 5 minutes try the exact same activity (intensity of workload) that just caused the symptoms and see if they are substantially diminished or not after saba 3) if there is an activity that reproducibly causes the symptoms, try the saba 15 min before the activity on alternate days   If in fact the saba really does help, then fine to continue to use it prn but advised may need to look closer at the maintenance regimen being used to achieve better control of airways disease with exertion.  (can  use dulera 15 min before evening walk, for example - see avs)           Each maintenance medication was reviewed in detail including emphasizing most importantly the difference between maintenance and prns and under what circumstances the prns are to be triggered using an action plan format where appropriate.  Total time for H and P, chart review, counseling, teaching device and generating customized AVS unique to this office visit / charting = 15 min

## 2020-01-31 ENCOUNTER — Ambulatory Visit: Payer: No Typology Code available for payment source

## 2020-02-07 ENCOUNTER — Other Ambulatory Visit: Payer: Self-pay

## 2020-02-07 ENCOUNTER — Ambulatory Visit (INDEPENDENT_AMBULATORY_CARE_PROVIDER_SITE_OTHER): Payer: No Typology Code available for payment source

## 2020-02-07 DIAGNOSIS — Z23 Encounter for immunization: Secondary | ICD-10-CM

## 2020-02-07 NOTE — Progress Notes (Signed)
   Covid-19 Vaccination Clinic  Name:  [name]    MRN: [id number] DOB: 01/01/1961  02/07/2020  [name] was observed post Covid-19 immunization for 15 minutes without incident. He was provided with Vaccine Information Sheet and instruction to access the V-Safe system.   [name] was instructed to call 911 with any severe reactions post vaccine: Marland Kitchen Difficulty breathing  . Swelling of face and throat  . A fast heartbeat  . A bad rash all over body  . Dizziness and weakness   #2 Covid Vaccine administered RD without complication.

## 2020-02-15 ENCOUNTER — Ambulatory Visit: Payer: No Typology Code available for payment source | Admitting: Internal Medicine

## 2020-02-16 NOTE — Patient Instructions (Addendum)
It was a pleasure to see you today!  Thank you for choosing [facility] for your primary care.  [name] was seen for dental referral.   Our plans for today were:  Please call [facility] at [phone]. They are located on [address], [place], [place] [address]  Please call an eye doctor to schedule diabetic eye exam at earliest convenience. You can try [facility] at [address] [place], [place] [address]. Phone: [phone]  I have placed a referral for dentist. You are able to receive dentist assistance at [facility] at [place], [place], [place] [address]. You can also try calling to schedule at [phone]  I have started a new medicine for you called [place]. This is for your diabetes. You will need to pick this up at the [facility] as they will help with the cost. You will take this medicine once a day. Be sure to drink plenty of water.  Please follow up in 1 month (on 03/21/20) for lab work. This is scheduled for 10:30 am that morning. I will call you with the results if abnormal.  Please continue your Metformin twice a day as prescribed. I have sent in refills to the pharmacy at [place].   Please schedule follow up in December/January for diabetes evaluation  To keep you healthy, please keep in mind the following health maintenance items that you are due for:   1.   Diabetic eye exam - call to schedule at earliest convenience    You should return to our clinic in 2 months for diabetes follow up.   Best Wishes,   Mina Marble, DO

## 2020-02-16 NOTE — Progress Notes (Signed)
   Subjective:   Patient ID: [name]    DOB: [birth year], 59 y.o. male   MRN: [id number]  [name] is a 59 y.o. 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  Dental Cares: Patient notes that he has many missing teeth and would like a dental referral.  He is aware of the dental resources provided by the orange card but requires a referral.  Review of Systems:  Per HPI.   Objective:   BP 122/60   Pulse 86   Ht 5\' 8"  (1.727 m)   Wt 158 lb 9.6 oz (71.9 kg)   SpO2 96%   BMI 24.12 kg/m  Vitals and nursing note reviewed.  General: pleasant older gentleman, sitting comfortably in exam chair, well nourished, well developed, in no acute distress with non-toxic appearance HEENT: Very poor dentition throughout with 11 missing teeth with presence of black decay and old fillings on remaining teeth Resp: breathing comfortably on room air, speaking in full sentences MSK: gait normal Neuro: Alert and oriented, speech normal  Assessment & Plan:   Uncontrolled type 2 diabetes mellitus, without long-term current use of insulin (HCC) Elevated A1C of 8.0, worsening despite max Metformin. Spoke with pharmacist at [facility] medication assistant program who provided list of covered medications including Farxiga, Janumet, Januiva, Jardiance among others. - Start Farxiga 10mg  QD  - Follow up in 1 month for repeat BMP  - follow up in 2 months for repeat A1C - patient given resources for eye doctor that he can call for diabetic eye exam  Poor dentition Evidence of poor detention on physical exam.   Patient is able to obtain dental care through orange card. Information provided.  Referral sent for [facility]  Orders Placed This Encounter  Procedures  . Ambulatory  referral to Dentistry    Referral Priority:   Routine    Referral Type:   Consultation    Referral Reason:   Specialty Services Required    Requested Specialty:   Dental General Practice    Number of Visits Requested:   1   Meds ordered this encounter  Medications  . metFORMIN (GLUCOPHAGE) 1000 MG tablet    Sig: Take 1 tablet (1,000 mg total) by mouth 2 (two) times daily with a meal.    Dispense:  180 tablet    Refill:  1  . DISCONTD: dapagliflozin propanediol (FARXIGA) 10 MG TABS tablet    Sig: Take 1 tablet (10 mg total) by mouth daily.    Dispense:  30 tablet    Refill:  2  . dapagliflozin propanediol (FARXIGA) 10 MG TABS tablet    Sig: Take 1 tablet (10 mg total) by mouth daily.    Dispense:  30 tablet    Refill:  2    Mina Marble, DO PGY-3, [facility] 02/18/2020 11:23 PM

## 2020-02-17 ENCOUNTER — Other Ambulatory Visit: Payer: Self-pay

## 2020-02-17 ENCOUNTER — Ambulatory Visit (INDEPENDENT_AMBULATORY_CARE_PROVIDER_SITE_OTHER): Payer: No Typology Code available for payment source | Admitting: Family Medicine

## 2020-02-17 ENCOUNTER — Encounter: Payer: Self-pay | Admitting: Family Medicine

## 2020-02-17 VITALS — BP 122/60 | HR 86 | Ht 68.0 in | Wt 158.6 lb

## 2020-02-17 DIAGNOSIS — E1165 Type 2 diabetes mellitus with hyperglycemia: Secondary | ICD-10-CM

## 2020-02-17 DIAGNOSIS — K089 Disorder of teeth and supporting structures, unspecified: Secondary | ICD-10-CM

## 2020-02-17 DIAGNOSIS — IMO0002 Reserved for concepts with insufficient information to code with codable children: Secondary | ICD-10-CM

## 2020-02-17 MED ORDER — METFORMIN HCL 1000 MG PO TABS: 1000.0000 mg | ORAL_TABLET | Freq: Two times a day (BID) | ORAL | 1 refills | Status: DC

## 2020-02-17 MED ORDER — DAPAGLIFLOZIN PROPANEDIOL 10 MG PO TABS: 10.0000 mg | ORAL_TABLET | Freq: Every day | ORAL | 2 refills | Status: DC

## 2020-02-18 DIAGNOSIS — K089 Disorder of teeth and supporting structures, unspecified: Secondary | ICD-10-CM | POA: Insufficient documentation

## 2020-02-18 NOTE — Assessment & Plan Note (Addendum)
Elevated A1C of 8.0, worsening despite max Metformin. Spoke with pharmacist at [facility] medication assistant program who provided list of covered medications including Farxiga, Janumet, Januiva, Jardiance among others. - Start Farxiga 10mg  QD  - Follow up in 1 month for repeat BMP  - follow up in 2 months for repeat A1C - patient given resources for eye doctor that he can call for diabetic eye exam

## 2020-02-18 NOTE — Assessment & Plan Note (Addendum)
Evidence of poor detention on physical exam.   Patient is able to obtain dental care through [facility]. Information provided.  Referral sent for [facility]

## 2020-02-29 ENCOUNTER — Ambulatory Visit: Payer: No Typology Code available for payment source | Admitting: Family Medicine

## 2020-03-15 ENCOUNTER — Encounter (HOSPITAL_COMMUNITY): Payer: Self-pay

## 2020-03-15 ENCOUNTER — Other Ambulatory Visit: Payer: Self-pay

## 2020-03-15 ENCOUNTER — Emergency Department (HOSPITAL_COMMUNITY)
Admission: EM | Admit: 2020-03-15 | Discharge: 2020-03-15 | Disposition: A | Discharge status: Home / Self Care | Payer: No Typology Code available for payment source | Attending: Emergency Medicine | Admitting: Emergency Medicine

## 2020-03-15 DIAGNOSIS — E119 Type 2 diabetes mellitus without complications: Secondary | ICD-10-CM | POA: Insufficient documentation

## 2020-03-15 DIAGNOSIS — Z7984 Long term (current) use of oral hypoglycemic drugs: Secondary | ICD-10-CM | POA: Insufficient documentation

## 2020-03-15 DIAGNOSIS — J45909 Unspecified asthma, uncomplicated: Secondary | ICD-10-CM | POA: Insufficient documentation

## 2020-03-15 DIAGNOSIS — Z7951 Long term (current) use of inhaled steroids: Secondary | ICD-10-CM | POA: Insufficient documentation

## 2020-03-15 DIAGNOSIS — Z7982 Long term (current) use of aspirin: Secondary | ICD-10-CM | POA: Insufficient documentation

## 2020-03-15 DIAGNOSIS — Z79899 Other long term (current) drug therapy: Secondary | ICD-10-CM | POA: Insufficient documentation

## 2020-03-15 DIAGNOSIS — Z87891 Personal history of nicotine dependence: Secondary | ICD-10-CM | POA: Insufficient documentation

## 2020-03-15 DIAGNOSIS — I2510 Atherosclerotic heart disease of native coronary artery without angina pectoris: Secondary | ICD-10-CM | POA: Insufficient documentation

## 2020-03-15 DIAGNOSIS — Y939 Activity, unspecified: Secondary | ICD-10-CM | POA: Insufficient documentation

## 2020-03-15 DIAGNOSIS — M7021 Olecranon bursitis, right elbow: Secondary | ICD-10-CM

## 2020-03-15 DIAGNOSIS — I10 Essential (primary) hypertension: Secondary | ICD-10-CM | POA: Insufficient documentation

## 2020-03-15 MED ORDER — CLINDAMYCIN PALMITATE HCL 75 MG/5ML PO SOLR: 300.0000 mg | Freq: Three times a day (TID) | ORAL | 0 refills | Status: AC

## 2020-03-15 MED ORDER — NAPROXEN 375 MG PO TABS: 375.0000 mg | ORAL_TABLET | Freq: Two times a day (BID) | ORAL | 0 refills | Status: AC

## 2020-03-15 NOTE — Discharge Instructions (Signed)
You were given a prescription for antibiotics. Please take the antibiotic prescription fully.   You may alternate taking Tylenol and Naproxen as needed for pain control. You may take Naproxen twice daily as directed on your discharge paperwork and you may take  [phone] mg of Tylenol every 6 hours. Do not exceed 4000 mg of Tylenol daily as this can lead to liver damage. Also, make sure to take Naproxen with meals as it can cause an upset stomach. Do not take other NSAIDs while taking Naproxen such as (Aleve, Ibuprofen, Aspirin, Celebrex, etc) and do not take more than the prescribed dose as this can lead to ulcers and bleeding in your GI tract. You may use warm and cold compresses to help with your symptoms.   Please follow up with your primary doctor within the next 7-10 days for re-evaluation and further treatment of your symptoms.   Please return to the ER sooner if you have any new or worsening symptoms.

## 2020-03-15 NOTE — ED Triage Notes (Signed)
Pt arrives to ED w/ c/o R elbow pain and swelling x 1 week. Pt denies injury/trauma. Pt reports 7/10 pain.

## 2020-03-15 NOTE — ED Provider Notes (Signed)
[facility] EMERGENCY DEPARTMENT Provider Note   CSN: [id number] Arrival date & time: 03/15/20  1155     History Chief Complaint  Patient presents with  . Elbow Pain    [name] is a 59 y.o. male.  HPI   58 year old male with a history of asthma, CAD, cellulitis, COPD, diabetes, GERD, hyperlipidemia, hypertension, leukocytosis, MI, reactive airway disease, thyroid nodule, who presents emergency department today for evaluation of right elbow pain and swelling.  He denies any known injury or trauma.  States his elbow has been swelling for the last week or so.  He denies any fevers or pain with range of motion of the elbow.  He does have some pain when he presses on the elbow.  Rates pain 7/10.  Denies any interventions for symptoms.  Past Medical History:  Diagnosis Date  . Asthma   . CAD (coronary artery disease)    a. CAD s/p anterior MI in 2008 treated with DES to the LAD.  Marland Kitchen Cataract 2018   both eyes  . Cellulitis of leg, right 10/2014  . COPD (chronic obstructive pulmonary disease) ([place])   . Dental caries   . Diabetes mellitus    TYPE 2  . GERD (gastroesophageal reflux disease)   . Hyperlipidemia   . Hypertension 2017  . Ischemic cardiomyopathy   . Leukocytosis    noted on labs  . Microcytic anemia   . Myocardial infarction ([place]) 2008  . Reactive airway disease   . Thyroid nodule    biopsy negative 2006    Patient Active Problem List   Diagnosis Date Noted  . Poor dentition 02/18/2020  . Former smoker 01/13/2020  . Syncope and collapse   . 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 GERD (gastroesophageal reflux disease) 10/26/2017  . Mixed hyperlipidemia 07/13/2017  . Primary hypertension 04/22/2017  . COPD with asthma ([place]) 12/18/2014  . Cataracts, bilateral 06/27/2014  . Cardiomyopathy, ischemic 11/11/2011  . Uncontrolled type 2 diabetes mellitus, without long-term current use of insulin ([place])  06/12/2009  . Atherosclerosis of native coronary artery of native heart 07/13/2008  . History of MI (myocardial infarction) 04/01/2007  . Sinusitis, chronic 06/18/2006    Past Surgical History:  Procedure Laterality Date  . CORONARY STENT PLACEMENT    . ESOPHAGOGASTRODUODENOSCOPY N/A 03/29/2017   Procedure: ESOPHAGOGASTRODUODENOSCOPY (EGD);  Surgeon: Milus Banister, MD;  Location: [facility] ENDOSCOPY;  Service: Endoscopy;  Laterality: N/A;  . ESOPHAGOGASTRODUODENOSCOPY N/A 03/31/2018   Procedure: ESOPHAGOGASTRODUODENOSCOPY (EGD);  Surgeon: Doran Stabler, MD;  Location: [facility];  Service: Gastroenterology;  Laterality: N/A;  . FOREIGN BODY REMOVAL N/A 03/31/2018   Procedure: FOREIGN BODY REMOVAL;  Surgeon: Doran Stabler, MD;  Location: [facility];  Service: Gastroenterology;  Laterality: N/A;  . solitary pulm and thryoid nodules         Family History  Problem Relation Age of Onset  . Diabetes Mother   . Heart attack Brother 81  . Colon cancer Neg Hx   . Esophageal cancer Neg Hx   . Rectal cancer Neg Hx   . Stomach cancer Neg Hx     Social History   Tobacco Use  . Smoking status: Former Smoker    Packs/day: 0.50    Years: 22.00    Pack years: 11.00    Types: Cigarettes    Quit date: 04/21/2006    Years since quitting: 13.9  . Smokeless tobacco: Never Used  .  Tobacco comment: quit 10 years ago  Vaping Use  . Vaping Use: Never used  Substance Use Topics  . Alcohol use: No    Alcohol/week: 0.0 standard drinks  . Drug use: No    Home Medications Prior to Admission medications   Medication Sig Start Date End Date Taking? Authorizing Provider  acetaminophen (TYLENOL) 500 MG tablet Take 1,000 mg by mouth every 6 (six) hours as needed for headache (pain).    [provider]  albuterol (VENTOLIN HFA) 108 (90 Base) MCG/ACT inhaler Inhale 2 puffs into the lungs every 4 (four) hours as needed for wheezing or shortness of breath (((PLAN B))). 01/16/20   Tanda Rockers, MD  aspirin EC 81 MG tablet Take 81 mg by mouth daily.    [provider]  atorvastatin (LIPITOR) 40 MG tablet Take 1 tablet (40 mg total) by mouth at bedtime. 08/11/19   Burnell Blanks, MD  calcium-vitamin D (OSCAL WITH D) 250-125 MG-UNIT tablet Take 1 tablet by mouth 2 (two) times daily.    [provider]  clindamycin (CLEOCIN) 75 MG/5ML solution Take 20 mLs (300 mg total) by mouth 3 (three) times daily for 7 days. 03/15/20 03/22/20  Euan Wandler S, PA-C  dapagliflozin propanediol (FARXIGA) 10 MG TABS tablet Take 1 tablet (10 mg total) by mouth daily. 02/17/20   Mullis, Kiersten P, DO  Ferrous Sulfate (IRON) 325 (65 Fe) MG TABS Take 1 tablet (325 mg total) by mouth daily. 01/10/20 02/09/20  Mullis, Kiersten P, DO  fluticasone-salmeterol (ADVAIR HFA) 230-21 MCG/ACT inhaler Inhale 2 puffs into the lungs 2 (two) times daily. 12/28/19   Martyn Ehrich, NP  metFORMIN (GLUCOPHAGE) 1000 MG tablet Take 1 tablet (1,000 mg total) by mouth 2 (two) times daily with a meal. 02/17/20   Mullis, Kiersten P, DO  Multiple Vitamin (MULTIVITAMIN WITH MINERALS) TABS tablet Take 1 tablet by mouth daily.    [provider]  naproxen (NAPROSYN) 375 MG tablet Take 1 tablet (375 mg total) by mouth 2 (two) times daily for 7 days. 03/15/20 03/22/20  Deven Audi S, PA-C  pantoprazole (PROTONIX) 20 MG tablet Take 1 tablet (20 mg total) by mouth daily. 06/30/19   Chase Picket, MD  predniSONE (DELTASONE) 10 MG tablet Take 1 tablet (10 mg total) by mouth daily with breakfast. 01/16/20   Tanda Rockers, MD  sacubitril-valsartan (ENTRESTO) 97-103 MG Take 1 tablet by mouth 2 (two) times daily. 01/13/20   Burnell Blanks, MD    Allergies    Patient has no known allergies.  Review of Systems   Review of Systems  Constitutional: Negative for fever.  Musculoskeletal:       Right elbow pain and swelling  Skin: Negative for rash.    Physical Exam Updated Vital  Signs BP (!) 162/80   Pulse 89   Temp 98.2 F (36.8 C) (Oral)   Resp 16   Ht 5\' 8"  (1.727 m)   Wt 71.7 kg   SpO2 98%   BMI 24.02 kg/m   Physical Exam Vitals and nursing note reviewed.  Constitutional:      Appearance: He is well-developed.  HENT:     Head: Normocephalic and atraumatic.  Eyes:     Conjunctiva/sclera: Conjunctivae normal.  Cardiovascular:     Rate and Rhythm: Normal rate.  Pulmonary:     Effort: Pulmonary effort is normal.  Abdominal:     Palpations: Abdomen is soft.  Musculoskeletal:     Cervical back:  Neck supple.     Comments: TTP and swelling noted to the right olecranon. There is some mild warmth noted. There is a small scab noted that is superficial. The is no purulent drainage and there is a very small amount of erythema.  Painless range of motion of the right elbow  Skin:    General: Skin is warm and dry.  Neurological:     Mental Status: He is alert.     ED Results / Procedures / Treatments   Labs (all labs ordered are listed, but only abnormal results are displayed) Labs Reviewed - No data to display  EKG None  Radiology No results found.  Procedures Procedures (including critical care time)  Medications Ordered in ED Medications - No data to display  ED Course  I have reviewed the triage vital signs and the nursing notes.  Pertinent labs & imaging results that were available during my care of the patient were reviewed by me and considered in my medical decision making (see chart for details).    MDM Rules/Calculators/A&P                          59 year old male presenting for evaluation of right elbow swelling and pain starting 4 days ago.  Patient's vital signs are reassuring other than some hypertension.  He has no fever, tachycardia.  On exam it appears he has olecranon bursitis.  There is a very small superficial scab with some very mild surrounding erythema.  There is no significant induration or signs of any extensive  cellulitis.  Clinically I suspect that this is a simple olecranon bursitis but I will cover him with a course of antibiotics out of an abundance of caution.  I will also prescribe a course of anti-inflammatories.  He has a PCP and I advised he follow-up with his PCP in about a week to ensure resolution of symptoms and advised to come to the ED for any worsening of symptoms in the meantime.  Voiced understanding of plan reasons return.  Questions answered.  Patient stable for discharge.   Final Clinical Impression(s) / ED Diagnoses Final diagnoses:  Olecranon bursitis of right elbow    Rx / DC Orders ED Discharge Orders         Ordered    clindamycin (CLEOCIN) 75 MG/5ML solution  3 times daily        03/15/20 1222    naproxen (NAPROSYN) 375 MG tablet  2 times daily        03/15/20 [address], Rubel Heckard S, PA-C 03/15/20 Merkel, Ankit, MD 03/16/20 1346

## 2020-03-21 ENCOUNTER — Other Ambulatory Visit: Payer: No Typology Code available for payment source

## 2020-03-21 ENCOUNTER — Other Ambulatory Visit: Payer: Self-pay

## 2020-03-21 DIAGNOSIS — Z79899 Other long term (current) drug therapy: Secondary | ICD-10-CM

## 2020-03-22 ENCOUNTER — Encounter: Payer: Self-pay | Admitting: Family Medicine

## 2020-03-22 LAB — BASIC METABOLIC PANEL
BUN/Creatinine Ratio: 10 (ref 10–24)
BUN: 11 mg/dL (ref 8–27)
CO2: 21 mmol/L (ref 20–29)
Calcium: 9.6 mg/dL (ref 8.6–10.2)
Chloride: 102 mmol/L (ref 96–106)
Creatinine, Ser: 1.1 mg/dL (ref 0.76–1.27)
GFR calc Af Amer: 83 mL/min/{1.73_m2} (ref >59)
GFR calc non Af Amer: 72 mL/min/{1.73_m2} (ref >59)
Glucose: 145 mg/dL — ABNORMAL HIGH (ref 65–99)
Potassium: 4.4 mmol/L (ref 3.5–5.2)
Sodium: 139 mmol/L (ref 134–144)

## 2020-03-26 ENCOUNTER — Ambulatory Visit (INDEPENDENT_AMBULATORY_CARE_PROVIDER_SITE_OTHER): Payer: No Typology Code available for payment source | Admitting: Family Medicine

## 2020-03-26 ENCOUNTER — Other Ambulatory Visit: Payer: Self-pay

## 2020-03-26 ENCOUNTER — Encounter: Payer: Self-pay | Admitting: Family Medicine

## 2020-03-26 VITALS — BP 120/80 | HR 69 | Ht 68.0 in | Wt 153.0 lb

## 2020-03-26 DIAGNOSIS — M7021 Olecranon bursitis, right elbow: Secondary | ICD-10-CM

## 2020-03-26 DIAGNOSIS — B353 Tinea pedis: Secondary | ICD-10-CM

## 2020-03-26 DIAGNOSIS — IMO0002 Reserved for concepts with insufficient information to code with codable children: Secondary | ICD-10-CM

## 2020-03-26 DIAGNOSIS — E1165 Type 2 diabetes mellitus with hyperglycemia: Secondary | ICD-10-CM

## 2020-03-26 MED ORDER — METFORMIN HCL 1000 MG PO TABS: 1000.0000 mg | ORAL_TABLET | Freq: Two times a day (BID) | ORAL | 1 refills | Status: DC

## 2020-03-26 NOTE — Patient Instructions (Signed)
It was a pleasure to see you today!  Thank you for choosing [facility] for your primary care.  [name] was seen for elbow and foot rash.   Our plans for today were:  Right elbow: this is olecranon bursitis. Very common. Occurds due to excessive pressure ot the elbow or trauma. I recommend cleaning elbow daily with soap and water. Apply small amount of vaseline and bandage. I then would wrap the area with a Velcro elbow brace  Foot rash: this is tinea pedis. This is very common in diabetics. It is a yeast infection that likes warm, moist areas. Treatment for this includes:   Lotrmin AF spray twice a day for 1 week or until resolves  Zeasorb can be used once this heals to help prevent recurrence  I recommend buying new tennis shoes to help prevent recurrence because most people will continue to get the rash if they use the same shoes that caused the rash  I am sorry we did not have enough time to discuss your other concerns today. Please schedule a follow-up visit at your earliest convenience to discuss these.  Your daughter may see all of our discussions through my chart. Best Wishes,   Mina Marble, DO

## 2020-03-26 NOTE — Progress Notes (Signed)
Subjective:   Patient ID: [name]    DOB: 23-Nov-1960, 59 y.o. male   MRN: [id number]  [name] is a 59 y.o. male with a history of atherosclerosis, cardiomyopathy, primary hypertension, COPD with asthma, sinusitis, GERD, ported tension, uncontrolled diabetes, bilateral cataracts, former smoker, history of MI, microcytic anemia, mixed hyperlipidemia, syncope/collapse here for follow up of olecranon bursitis.  Olecranon Bursitis: ED on 03/15/2020 for right elbow pain and swelling.  He denied any injury or trauma.  He denied any fevers or pain with range of motion.  He was noted to have a small scab with mild surrounding erythema but no concerns for cellulitis.  He was treated with a course of antibiotics (clindamycin solution 3 times daily) for extra precaution and a course of anti-inflammatories (naproxen 375 mg twice daily).  He was advised to follow-up with PCP.  He notes that it burst and started draining on Saturday of serosanguineous fluid.  He notes that it is itchy now.  Denies any pain, fever, chills.  He notes that he completed the antibiotic ointment as instructed.   Rash between Toe: Patient complains of rash 2nd-5th toes that is red and moist.  Denies any itching.   Review of Systems:  Per HPI.   Objective:   BP 120/80   Pulse 69   Ht 5\' 8"  (1.727 m)   Wt 153 lb (69.4 kg)   SpO2 98%   BMI 23.26 kg/m  Vitals and nursing note reviewed.  General: pleasant older gentleman, sitting comfortably in exam chair, well nourished, well developed, in no acute distress with non-toxic appearance Resp: breathing comfortably on room air, speaking in full sentences Abdomen: soft, non-tender, non-distended, no masses or organomegaly palpable, normoactive bowel sounds Skin: warm, dry, right posterior elbow along the olecranon erythematous without increased warmth no open wound or active drainage or bleeding. Erythematous scaling rash with some moisture between 2nd-5th webspaces that  extend up to dorsal aspect of foot MSK/Extremities: warm and well perfused, bilateral upper extremities with full range of motion 5 out of 5 strength, right olecranon nontender to palpation, small fluid appreciated along right olecranon bursa Neuro: Alert and oriented, speech normal    Assessment & Plan:   Olecranon bursitis of right elbow Established problem, seen in ED and treated with topical antibiotics and NSAIDs.  Has since drained and improved.  No signs of acute bacterial infection.  Good range of motion and nontender to palpation. -Recommend cleaning area and applying Vaseline and bandage -Elbow brace with Velcro until resolved - RTC if worsening swelling, warmth, pain, abnormal discharge, fever/chills  Tinea pedis of right foot History and physical exam appear most consistent with tenia pedis in setting of uncontrolled type 2 diabetes.  Will treat with topical Lotrimin AF spray until resolved.  Recommended new tennis shoes.  Zeasorb powder for prevention.  Follow-up in 2 to 3 weeks if no improvement, sooner if worsening.  Uncontrolled type 2 diabetes mellitus, without long-term current use of insulin (HCC) Requested refill of Metformin. Refill provided. Follow up in January 2022 for diabetes follow up  No orders of the defined types were placed in this encounter.  Meds ordered this encounter  Medications  . DISCONTD: metFORMIN (GLUCOPHAGE) 1000 MG tablet    Sig: Take 1 tablet (1,000 mg total) by mouth 2 (two) times daily with a meal.    Dispense:  180 tablet    Refill:  1  . metFORMIN (GLUCOPHAGE) 1000 MG tablet    Sig: Take 1 tablet (  1,000 mg total) by mouth 2 (two) times daily with a meal.    Dispense:  180 tablet    Refill:  [place], DO PGY-3, [facility] 03/27/2020 12:32 PM

## 2020-03-27 DIAGNOSIS — M7020 Olecranon bursitis, unspecified elbow: Secondary | ICD-10-CM | POA: Insufficient documentation

## 2020-03-27 DIAGNOSIS — M7021 Olecranon bursitis, right elbow: Secondary | ICD-10-CM | POA: Insufficient documentation

## 2020-03-27 DIAGNOSIS — B353 Tinea pedis: Secondary | ICD-10-CM | POA: Insufficient documentation

## 2020-03-27 MED ORDER — METFORMIN HCL 1000 MG PO TABS: 1000.0000 mg | ORAL_TABLET | Freq: Two times a day (BID) | ORAL | 1 refills | Status: DC

## 2020-03-27 NOTE — Assessment & Plan Note (Addendum)
Established problem, seen in ED and treated with topical antibiotics and NSAIDs.  Has since drained and improved.  No signs of acute bacterial infection.  Good range of motion and nontender to palpation. -Recommend cleaning area and applying Vaseline and bandage -Elbow brace with Velcro until resolved - RTC if worsening swelling, warmth, pain, abnormal discharge, fever/chills

## 2020-03-27 NOTE — Assessment & Plan Note (Signed)
History and physical exam appear most consistent with tenia pedis in setting of uncontrolled type 2 diabetes.  Will treat with topical Lotrimin AF spray until resolved.  Recommended new tennis shoes.  Zeasorb powder for prevention.  Follow-up in 2 to 3 weeks if no improvement, sooner if worsening.

## 2020-03-27 NOTE — Assessment & Plan Note (Addendum)
Requested refill of Metformin. Refill provided. Follow up in January 2022 for diabetes follow up

## 2020-03-30 ENCOUNTER — Encounter: Payer: Self-pay | Admitting: Family Medicine

## 2020-03-30 ENCOUNTER — Telehealth: Payer: Self-pay | Admitting: Internal Medicine

## 2020-03-30 ENCOUNTER — Ambulatory Visit (INDEPENDENT_AMBULATORY_CARE_PROVIDER_SITE_OTHER): Payer: No Typology Code available for payment source | Admitting: Family Medicine

## 2020-03-30 ENCOUNTER — Other Ambulatory Visit: Payer: Self-pay

## 2020-03-30 DIAGNOSIS — M7021 Olecranon bursitis, right elbow: Secondary | ICD-10-CM

## 2020-03-30 NOTE — Telephone Encounter (Signed)
I have called and spoke with pts daughter and she is aware of the number to call [place] to find out about the advair.  He was approved to receive this via pt assistance and it was approved from 12/2019-12/2020.

## 2020-03-30 NOTE — Patient Instructions (Signed)
Elbow injury: You do not have an infection.  We do not need to give you antibiotic pills.  I only want to avoid an infection to the inside of your elbow.  Please use the antibiotic ointment I provided for the next 3 or 4 days and also keep your elbow lightly wrapped with the Ace wrap I have provided.  I expect that you may have more fluid accumulation once the skin is healed.  I think that is okay and you do not need to drain it again.  Please return to clinic if you are having significant pain/discomfort in your arm or fevers or new symptoms.

## 2020-03-30 NOTE — Assessment & Plan Note (Signed)
Spontaneously ruptured.  No evidence of infection.  For now we will focus on avoiding any possible future infections. -Bacitracin bandages and Ace wrap provided and applied in clinic. -Return to clinic for worsened or new symptoms. -No need to drain future fluid accumulations.

## 2020-03-30 NOTE — Progress Notes (Signed)
    SUBJECTIVE:   CHIEF COMPLAINT / HPI:   Olecranon bursitis, spontaneously ruptured Octavia notes that the bump on his elbow seem to have spontaneously burst about 3 days ago.  He has been squeezing it to get extra liquid out which is always clear.  It burst again this morning which is why he came back in.  He wanted to make sure there is no infection that needs to be treated with antibiotics.  He is not having any elbow pain.  He specifically denies fever, redness, rash, nausea, chills.  PERTINENT  PMH / PSH: Diabetes  OBJECTIVE:   Pulse 88   Ht 5\' 8"  (1.727 m)   Wt 155 lb 8 oz (70.5 kg)   SpO2 98%   BMI 23.64 kg/m    General: Alert and cooperative and appears to be in no acute distress. Respiratory: Breathing comfortably on room air.  No respiratory distress. Left elbow: No notable erythema.  No evidence of purulence or tenderness to palpation.  Range of motion fully intact with passive and active range of motion.   Media Information         Document Information  Photos    03/30/2020 14:59  Attached To:  Office Visit on 03/30/20 with Matilde Haymaker, MD   Source Information  Matilde Haymaker, MD  [facility] Resident     ASSESSMENT/PLAN:   Olecranon bursitis Spontaneously ruptured.  No evidence of infection.  For now we will focus on avoiding any possible future infections. -Bacitracin bandages and Ace wrap provided and applied in clinic. -Return to clinic for worsened or new symptoms. -No need to drain future fluid accumulations.     Matilde Haymaker, MD [place]

## 2020-04-11 ENCOUNTER — Ambulatory Visit (INDEPENDENT_AMBULATORY_CARE_PROVIDER_SITE_OTHER): Payer: No Typology Code available for payment source | Admitting: Family Medicine

## 2020-04-11 ENCOUNTER — Encounter: Payer: Self-pay | Admitting: Family Medicine

## 2020-04-11 ENCOUNTER — Other Ambulatory Visit: Payer: Self-pay

## 2020-04-11 VITALS — BP 126/70 | HR 70 | Ht 68.0 in | Wt 156.4 lb

## 2020-04-11 DIAGNOSIS — M7021 Olecranon bursitis, right elbow: Secondary | ICD-10-CM

## 2020-04-11 MED ORDER — NAPROXEN 375 MG PO TABS: 375.0000 mg | ORAL_TABLET | Freq: Two times a day (BID) | ORAL | 1 refills | Status: DC

## 2020-04-11 MED ORDER — ACETAMINOPHEN 500 MG PO TABS: 1000.0000 mg | ORAL_TABLET | Freq: Three times a day (TID) | ORAL | 1 refills | Status: DC

## 2020-04-11 NOTE — Progress Notes (Deleted)
   Subjective:   Patient ID: [name]    DOB: February 20, 1961, 59 y.o. male   MRN: [id number]  [name] is a 59 y.o. male with a history of CAD, ischemic cardiomyopathy, hypertension, COPD with asthma, chronic sinusitis, GERD, poor dentition, uncontrolled type 2 diabetes, bilateral cataracts, former smoker, history of MI, microcytic anemia, mixed hyperlipidemia, syncope and collapse here for persistent olecranon bursitis  Olecranon bursitis: Patient presents with continued redness and irritation of his right elbow.  He was diagnosed with olecranon bursitis on 11/25.  He was treated with clindamycin solution 3 times daily and naproxen.  He followed up with me on 12/6 with improvement in symptoms and spontaneous drainage.  He had good range of motion and was nontender to palpation.  He was recommended to apply Vaseline and bandage and Velcro base.  He followed up again on 12/10 with Dr. Matilde Haymaker because the elbow burst again and was spontaneously draining.  He wanted to make sure there is no infection that needed to be treated.  There is no evidence of infection at that time.  He was treated with bacitracin bandage and Ace wrap.  Today he notes***  Review of Systems:  Per HPI.   Objective:   There were no vitals taken for this visit. Vitals and nursing note reviewed.  General: pleasant ***, sitting comfortably in exam chair, well nourished, well developed, in no acute distress with non-toxic appearance HEENT: normocephalic, atraumatic, moist mucous membranes, oropharynx clear without erythema or exudate, TM normal bilaterally  Neck: supple, non-tender without lymphadenopathy CV: regular rate and rhythm without murmurs, rubs, or gallops, no lower extremity edema, 2+ radial and pedal pulses bilaterally Lungs: clear to auscultation bilaterally with normal work of breathing on room air Resp: breathing comfortably on room air, speaking in full sentences Abdomen: soft, non-tender,  non-distended, no masses or organomegaly palpable, normoactive bowel sounds Skin: warm, dry, no rashes or lesions Extremities: warm and well perfused, normal tone MSK: ROM grossly intact, strength intact, gait normal Neuro: Alert and oriented, speech normal  Assessment & Plan:   No problem-specific Assessment & Plan notes found for this encounter.  No orders of the defined types were placed in this encounter.  No orders of the defined types were placed in this encounter.   Mina Marble, DO PGY-3, [facility] 04/11/2020 8:17 AM

## 2020-04-11 NOTE — Patient Instructions (Signed)
Thank you for coming in to see [place] today! Please see below to review our plan for today's visit:  1. Naproxen 1 tablet 2 times daily for 5 days to decrease elbow swelling/inflammation. 2. Take Tylenol 1 tablet 3 times daily for 5 days to decrease elbow swelling/inflammation. 3. Bursitis can take a few months to go away. Your elbow does not look infected. 4. Continue to wear your elbow wrap to decrease swelling.  5. You can use heat/cold compresses to help shrink the area.   Please call the clinic at [phone] if your symptoms worsen or you have any concerns. It was our pleasure to serve you!   Dr. Milus Banister [facility]

## 2020-04-11 NOTE — Progress Notes (Signed)
   Subjective:   Patient ID: [name]    DOB: [birth year], 59 y.o. male   MRN: [id number]  [name] is a 59 y.o. male with a history of CAD, ischemic cardiomyopathy, hypertension, COPD with asthma, chronic sinusitis, GERD, poor dentition, uncontrolled type 2 diabetes, bilateral cataracts, former smoker, history of MI, microcytic anemia, mixed hyperlipidemia, syncope and collapse here for persistent olecranon bursitis  Olecranon bursitis of right elbow: Patient presents with continued redness and irritation of his right elbow.  He was diagnosed with olecranon bursitis on 11/25.  He was treated with clindamycin solution 3 times daily and naproxen.  He followed up with PCP Dr. Alyssa Grove on 12/6 reporting improvement in symptoms and spontaneous drainage.  He had good range of motion and was nontender to palpation.  He was recommended to apply Vaseline and bandage and Velcro elbow brace.  He followed up again on 12/10 with Dr. Matilde Haymaker because the elbow burst open again and was spontaneously draining.  He wanted to make sure there is no infection that needed to be treated.  There is no evidence of infection at that time.  He was treated with bacitracin bandage and Ace wrap.    Today he notes the elbow has stopped draining, reports it stopped draining a couple of days ago.  Continues to be swollen but is less swollen than previously.  He is asking for neck steps in making his elbow back to normal.  He denies any pain in the elbow, has full range of motion on physical exam, denies fevers, body aches, chills.  Review of Systems:  Per HPI.   Objective:   Blood pressure 126/70, pulse 70, height 5\' 8"  (1.727 m), weight 156 lb 6.4 oz (70.9 kg), SpO2 97 %.  Physical exam: General: Well-appearing, nontoxic-appearing, pleasant patient Respiratory: Comfortable work of breathing, speaking complete sentences Right Elbow: - Inspection: Swelling with mild erythema appreciated to right elbow  indicative of olecranon bursitis, not warm to the touch, no ecchymoses appreciated - Palpation: No TTP - ROM: full active ROM in flexion and extension. No crepitus - Strength: 5/5 strength in wrist flexion and extension without pain. 5/5 strength in biceps, triceps. - Neuro: NV intact distally  Assessment & Plan:   Olecranon bursitis: Patient reassured about appearance of olecranon bursitis, educated this could take 2-3 months to go away on its own, however conservative support at home can help the swelling to decrease. -Restarted on Tylenol 1 tablet 3 times daily with naproxen 1 tablet twice daily for anti-inflammation measures -Patient instructed to continue to wear his elbow compression sleeve to help reduce the size of the olecranon bursitis -Patient can apply warm/cold compresses as needed for comfort or to assist draining, was warned that applying warm compress may make the elbow swell more -Instructed to not place pressure on the elbow against a hard surface as this can worsen the inflammation -Does not appear infected, no antibiotics indicated at this time  Milus Banister, [place], PGY-3 04/11/2020 12:38 PM

## 2020-07-02 ENCOUNTER — Other Ambulatory Visit: Payer: Self-pay | Admitting: Internal Medicine

## 2020-07-09 ENCOUNTER — Other Ambulatory Visit: Payer: Self-pay | Admitting: Family Medicine

## 2020-07-09 DIAGNOSIS — IMO0002 Reserved for concepts with insufficient information to code with codable children: Secondary | ICD-10-CM

## 2020-07-09 DIAGNOSIS — E1165 Type 2 diabetes mellitus with hyperglycemia: Secondary | ICD-10-CM

## 2020-07-12 NOTE — Telephone Encounter (Signed)
Called and lvm for patient to call back and schedule appointment.  

## 2020-07-12 NOTE — Telephone Encounter (Signed)
Please have patient schedule follow up appointment for diabetes recheck with me at earliest appointment. Thank you.

## 2020-07-24 ENCOUNTER — Other Ambulatory Visit: Payer: Self-pay | Admitting: Family Medicine

## 2020-07-24 ENCOUNTER — Other Ambulatory Visit: Payer: Self-pay | Admitting: Physician Assistant

## 2020-07-27 NOTE — Telephone Encounter (Signed)
Please have patient schedule appt with PCP for chronic care management (diabetes, cholesterol, blood pressure) as it has almost been a year since he was evaluated for these. I have provided 2 month rx to get him until he can see me. Thank you.

## 2020-07-27 NOTE — Telephone Encounter (Signed)
Please inform patient he will need to call his pulmonologist for further refills.

## 2020-07-30 NOTE — Telephone Encounter (Signed)
LVM for patient to call his Pulmonologist for refills of prednisone.  [name], [place]

## 2020-07-31 ENCOUNTER — Other Ambulatory Visit: Payer: Self-pay | Admitting: Internal Medicine

## 2020-07-31 NOTE — Telephone Encounter (Signed)
Called patient and scheduled follow up appointment.

## 2020-08-05 NOTE — Progress Notes (Signed)
Subjective:   Patient ID: [name]    DOB: [birth year], 60 y.o. male   MRN: [id number]  [name] is a 60 y.o. male with a history of atherosclerosis of native CAD with ischemic cardiomyopathy, HTN, COPD with asthma, chronic sinusitis, GERD, poor dentition, uncontrolled T2DM, bilateral cataracts, former smoker, h/o MI, microcytic anemia, HLD, h/o syncope here for chronic care management  Diabetes: Last three A1C's below. Currently on Metformin 1000mg  BID, Farxiga 10mg  QD. Endorses compliance. Notes CBGs range 95-180, with highest 150-190. Denies any hypoglycemia. Denies any polyuria, polydipsia, polyphagia. Due for diabetic eye exam and foot exam.  Lab Results  Component Value Date   HGBA1C 7.5 (A) 08/07/2020   HGBA1C 8.0 (A) 01/10/2020   HGBA1C 7.3 (A) 07/05/2019    HTN:  BP: 118/62 today. Currently on Entresto 97-103 BID. Endorses compliance. Non-smoker. Denies any chest pain, SOB, vision changes, or headaches.   Lab Results  Component Value Date   CREATININE 1.10 03/21/2020   CREATININE 1.11 07/05/2019   CREATININE 1.62 (H) 07/02/2019    HLD: Last lipid panel below. Currently on Atorvastatin 40mg  QD. Endorses compliance. Denies any muscles aches or weakness.   Lab Results  Component Value Date   CHOL 129 01/10/2020   HDL 57 01/10/2020   LDLCALC 38 01/10/2020   LDLDIRECT 91 08/14/2017   TRIG 222 (H) 01/10/2020   CHOLHDL 2.3 01/10/2020   Chronic corticosteroid use: Patient chronically on Prednisone 10mg  QD for severe COPD/Asthma. Takes Vitamin D and calcium.  Health Maintenance: Health Maintenance Due  Topic  . OPHTHALMOLOGY EXAM    Review of Systems:  Per HPI.   Objective:   BP 118/62   Pulse 84   Ht 5\' 8"  (1.727 m)   Wt 147 lb 6.4 oz (66.9 kg)   SpO2 97%   BMI 22.41 kg/m  Vitals and nursing note reviewed.  General: pleasant older gentleman, sitting comfortably in exam chair, well nourished, well developed, in no acute distress with non-toxic  appearance CV: regular rate and rhythm without murmurs, rubs, or gallops, no lower extremity edema, 2+ radial and pedal pulses bilaterally Lungs: clear to auscultation bilaterally with normal work of breathing on room air, speaking in full sentences Skin: warm, dry Extremities: warm and well perfused, right elbow bursitis has resolved and back to normal MSK:  gait normal Neuro: Alert and oriented, speech normal  Diabetic foot exam was performed with the following findings:   No deformities, ulcerations, or other skin breakdown Normal sensation of 10g monofilament Intact posterior tibialis and dorsalis pedis pulses Thickened toenails on all 5 toe nails on right foot.    Depression screen [facility] 2/9 08/07/2020 04/11/2020 03/30/2020  Decreased Interest 1 0 0  Down, Depressed, Hopeless 0 0 0  PHQ - 2 Score 1 0 0  Altered sleeping 0 0 0  Tired, decreased energy 1 0 1  Change in appetite 0 0 0  Feeling bad or failure about yourself  0 0 0  Trouble concentrating 0 0 0  Moving slowly or fidgety/restless 1 0 0  Suicidal thoughts 0 0 0  PHQ-9 Score 3 0 1  Difficult doing work/chores - Not difficult at all -  Some recent data might be hidden    Assessment & Plan:   Primary hypertension Chronic, well controlled. Nonsmoker. Continue Entresto as prescribed. Last BMP in Dec 2021 with normal kidney function   Mixed hyperlipidemia Chronic. 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 Colonoscopy in 2020 notable for tubular adenoma.  - encouraged to take iron supplement - recommend repeat CBC and ferritin at follow up visit in 3 months  Current chronic use of systemic steroids On chronic Prednisone for resistant COPD/Asthma.  Dexa scan today  If elevated, plan to start bisphosphonate  Currently on Vitamin D and  Calcium  Uncontrolled type 2 diabetes mellitus, without long-term current use of insulin (HCC) Chronic, improving. A1C improved from 7.5 to 8 today. Nonsmoker. On statin and ARB. - continue Metformin 1000mg  BID and Farxiga 10mg  QD - follow up in 3-6 months - If worsening, recommend GLP-1  Health Maintenance; - had diabetic eye exam ~4 months ago. Will request results - COVID vaccine given today  Orders Placed This Encounter  Procedures  . DG Bone Density    Standing Status:   Future    Standing Expiration Date:   08/07/2021    Order Specific Question:   Reason for Exam (SYMPTOM  OR DIAGNOSIS REQUIRED)    Answer:   chronic corticosteroid use    Order Specific Question:   Preferred imaging location?    Answer:   [facility]  . POCT glycosylated hemoglobin (Hb A1C)   No orders of the defined types were placed in this encounter.    Mina Marble, DO PGY-3, [facility] 08/07/2020 11:08 AM

## 2020-08-07 ENCOUNTER — Ambulatory Visit (INDEPENDENT_AMBULATORY_CARE_PROVIDER_SITE_OTHER): Payer: No Typology Code available for payment source | Admitting: Family Medicine

## 2020-08-07 ENCOUNTER — Encounter: Payer: Self-pay | Admitting: Family Medicine

## 2020-08-07 ENCOUNTER — Ambulatory Visit (INDEPENDENT_AMBULATORY_CARE_PROVIDER_SITE_OTHER): Payer: No Typology Code available for payment source

## 2020-08-07 ENCOUNTER — Other Ambulatory Visit: Payer: Self-pay

## 2020-08-07 VITALS — BP 118/62 | HR 84 | Ht 68.0 in | Wt 147.4 lb

## 2020-08-07 DIAGNOSIS — D509 Iron deficiency anemia, unspecified: Secondary | ICD-10-CM

## 2020-08-07 DIAGNOSIS — I10 Essential (primary) hypertension: Secondary | ICD-10-CM

## 2020-08-07 DIAGNOSIS — IMO0002 Reserved for concepts with insufficient information to code with codable children: Secondary | ICD-10-CM

## 2020-08-07 DIAGNOSIS — E782 Mixed hyperlipidemia: Secondary | ICD-10-CM

## 2020-08-07 DIAGNOSIS — Z7952 Long term (current) use of systemic steroids: Secondary | ICD-10-CM

## 2020-08-07 DIAGNOSIS — E1165 Type 2 diabetes mellitus with hyperglycemia: Secondary | ICD-10-CM

## 2020-08-07 DIAGNOSIS — Z23 Encounter for immunization: Secondary | ICD-10-CM

## 2020-08-07 DIAGNOSIS — H259 Unspecified age-related cataract: Secondary | ICD-10-CM

## 2020-08-07 LAB — POCT GLYCOSYLATED HEMOGLOBIN (HGB A1C): HbA1c, POC (controlled diabetic range): 7.5 % — AB (ref 0.0–7.0)

## 2020-08-07 NOTE — Assessment & Plan Note (Signed)
On chronic Prednisone for resistant COPD/Asthma.  Dexa scan today  If elevated, plan to start bisphosphonate  Currently on Vitamin D and Calcium

## 2020-08-07 NOTE — Assessment & Plan Note (Addendum)
Chronic, improving. A1C improved from 7.5 to 8 today. Nonsmoker. On statin and ARB. - continue Metformin 1000mg  BID and Farxiga 10mg  QD - follow up in 3-6 months - If worsening, recommend GLP-1

## 2020-08-07 NOTE — Assessment & Plan Note (Signed)
Chronic. - continue Atorvastatin 40mg  QD - lipid panel in Sept 2022

## 2020-08-07 NOTE — Assessment & Plan Note (Signed)
History of iron deficiency based on labs and D-iran hemoglobin on electrophoresis. Colonoscopy in 2020 notable for tubular adenoma.  - encouraged to take iron supplement - recommend repeat CBC and ferritin at follow up visit in 3 months

## 2020-08-07 NOTE — Patient Instructions (Signed)
It was a pleasure to see you today!  Thank you for choosing [facility] for your primary care.   Our plans for today were:  Your A1C improved from 8 to 7.5!! Congratulation!!  Continue all of your medications as prescribed  Follow up in 3 months  Please call to schedule your bone scan at your earliest convenience.    BRING ALL OF YOUR MEDICATIONS WITH YOU TO EVERY VISIT   You should return to our clinic in 3 months for diabetes follow up.   Best Wishes,   Mina Marble, DO

## 2020-08-07 NOTE — Assessment & Plan Note (Addendum)
Chronic, well controlled. Nonsmoker. Continue Entresto as prescribed. Last BMP in Dec 2021 with normal kidney function

## 2020-09-10 ENCOUNTER — Other Ambulatory Visit: Payer: Self-pay | Admitting: Primary Care

## 2020-09-20 ENCOUNTER — Inpatient Hospital Stay (HOSPITAL_COMMUNITY)
Admission: EM | Admit: 2020-09-20 | Discharge: 2020-09-22 | DRG: 247 | Disposition: A | Discharge status: Home / Self Care | Payer: No Typology Code available for payment source | Attending: Cardiology | Admitting: Cardiology

## 2020-09-20 ENCOUNTER — Emergency Department (HOSPITAL_COMMUNITY): Payer: No Typology Code available for payment source

## 2020-09-20 ENCOUNTER — Other Ambulatory Visit: Payer: Self-pay

## 2020-09-20 ENCOUNTER — Encounter (HOSPITAL_COMMUNITY): Payer: Self-pay | Admitting: Pharmacy Technician

## 2020-09-20 ENCOUNTER — Encounter (HOSPITAL_COMMUNITY)
Admission: EM | Disposition: A | Discharge status: Home / Self Care | Payer: Self-pay | Source: Home / Self Care | Attending: Cardiovascular Disease

## 2020-09-20 DIAGNOSIS — Z20822 Contact with and (suspected) exposure to covid-19: Secondary | ICD-10-CM | POA: Diagnosis present

## 2020-09-20 DIAGNOSIS — Z791 Long term (current) use of non-steroidal anti-inflammatories (NSAID): Secondary | ICD-10-CM

## 2020-09-20 DIAGNOSIS — I472 Ventricular tachycardia, unspecified: Secondary | ICD-10-CM

## 2020-09-20 DIAGNOSIS — I5022 Chronic systolic (congestive) heart failure: Secondary | ICD-10-CM | POA: Diagnosis present

## 2020-09-20 DIAGNOSIS — I2510 Atherosclerotic heart disease of native coronary artery without angina pectoris: Secondary | ICD-10-CM | POA: Diagnosis present

## 2020-09-20 DIAGNOSIS — I253 Aneurysm of heart: Secondary | ICD-10-CM

## 2020-09-20 DIAGNOSIS — Z833 Family history of diabetes mellitus: Secondary | ICD-10-CM

## 2020-09-20 DIAGNOSIS — I25110 Atherosclerotic heart disease of native coronary artery with unstable angina pectoris: Secondary | ICD-10-CM

## 2020-09-20 DIAGNOSIS — Z8249 Family history of ischemic heart disease and other diseases of the circulatory system: Secondary | ICD-10-CM

## 2020-09-20 DIAGNOSIS — I110 Hypertensive heart disease with heart failure: Secondary | ICD-10-CM | POA: Diagnosis present

## 2020-09-20 DIAGNOSIS — Z79899 Other long term (current) drug therapy: Secondary | ICD-10-CM

## 2020-09-20 DIAGNOSIS — K219 Gastro-esophageal reflux disease without esophagitis: Secondary | ICD-10-CM | POA: Diagnosis present

## 2020-09-20 DIAGNOSIS — Z7984 Long term (current) use of oral hypoglycemic drugs: Secondary | ICD-10-CM

## 2020-09-20 DIAGNOSIS — I959 Hypotension, unspecified: Secondary | ICD-10-CM | POA: Diagnosis present

## 2020-09-20 DIAGNOSIS — Z7982 Long term (current) use of aspirin: Secondary | ICD-10-CM

## 2020-09-20 DIAGNOSIS — Z87891 Personal history of nicotine dependence: Secondary | ICD-10-CM

## 2020-09-20 DIAGNOSIS — I255 Ischemic cardiomyopathy: Secondary | ICD-10-CM | POA: Diagnosis present

## 2020-09-20 DIAGNOSIS — E118 Type 2 diabetes mellitus with unspecified complications: Secondary | ICD-10-CM | POA: Diagnosis present

## 2020-09-20 DIAGNOSIS — I10 Essential (primary) hypertension: Secondary | ICD-10-CM | POA: Diagnosis present

## 2020-09-20 DIAGNOSIS — Z7951 Long term (current) use of inhaled steroids: Secondary | ICD-10-CM

## 2020-09-20 DIAGNOSIS — J449 Chronic obstructive pulmonary disease, unspecified: Secondary | ICD-10-CM | POA: Diagnosis present

## 2020-09-20 DIAGNOSIS — I252 Old myocardial infarction: Secondary | ICD-10-CM

## 2020-09-20 DIAGNOSIS — I214 Non-ST elevation (NSTEMI) myocardial infarction: Principal | ICD-10-CM | POA: Diagnosis present

## 2020-09-20 DIAGNOSIS — Z955 Presence of coronary angioplasty implant and graft: Secondary | ICD-10-CM

## 2020-09-20 DIAGNOSIS — E782 Mixed hyperlipidemia: Secondary | ICD-10-CM | POA: Diagnosis present

## 2020-09-20 DIAGNOSIS — Z7952 Long term (current) use of systemic steroids: Secondary | ICD-10-CM

## 2020-09-20 HISTORY — PX: LEFT HEART CATH AND CORONARY ANGIOGRAPHY: CATH118249

## 2020-09-20 HISTORY — PX: CORONARY STENT INTERVENTION: CATH118234

## 2020-09-20 LAB — COMPREHENSIVE METABOLIC PANEL
ALT: 19 U/L (ref 0–44)
AST: 47 U/L — ABNORMAL HIGH (ref 15–41)
Albumin: 3.6 g/dL (ref 3.5–5.0)
Alkaline Phosphatase: 39 U/L (ref 38–126)
Anion gap: 10 (ref 5–15)
BUN: 13 mg/dL (ref 8–23)
CO2: 19 mmol/L — ABNORMAL LOW (ref 22–32)
Calcium: 8.9 mg/dL (ref 8.9–10.3)
Chloride: 107 mmol/L (ref 98–111)
Creatinine, Ser: 1.07 mg/dL (ref 0.61–1.24)
GFR, Estimated: >60 mL/min (ref >60)
Glucose, Bld: 191 mg/dL — ABNORMAL HIGH (ref 70–99)
Potassium: 4.9 mmol/L (ref 3.5–5.1)
Sodium: 136 mmol/L (ref 135–145)
Total Bilirubin: 1.1 mg/dL (ref 0.3–1.2)
Total Protein: 6.8 g/dL (ref 6.5–8.1)

## 2020-09-20 LAB — CBC WITH DIFFERENTIAL/PLATELET
Abs Immature Granulocytes: 0.09 10*3/uL — ABNORMAL HIGH (ref 0.00–0.07)
Basophils Absolute: 0.2 10*3/uL — ABNORMAL HIGH (ref 0.0–0.1)
Basophils Relative: 1 %
Eosinophils Absolute: 0.9 10*3/uL — ABNORMAL HIGH (ref 0.0–0.5)
Eosinophils Relative: 6 %
HCT: 47.6 % (ref 39.0–52.0)
Hemoglobin: 14.7 g/dL (ref 13.0–17.0)
Immature Granulocytes: 1 %
Lymphocytes Relative: 31 %
Lymphs Abs: 4.6 10*3/uL — ABNORMAL HIGH (ref 0.7–4.0)
MCH: 23.8 pg — ABNORMAL LOW (ref 26.0–34.0)
MCHC: 30.9 g/dL (ref 30.0–36.0)
MCV: 77 fL — ABNORMAL LOW (ref 80.0–100.0)
Monocytes Absolute: 1.1 10*3/uL — ABNORMAL HIGH (ref 0.1–1.0)
Monocytes Relative: 7 %
Neutro Abs: 7.9 10*3/uL — ABNORMAL HIGH (ref 1.7–7.7)
Neutrophils Relative %: 54 %
Platelets: 269 10*3/uL (ref 150–400)
RBC: 6.18 MIL/uL — ABNORMAL HIGH (ref 4.22–5.81)
RDW: 15.3 % (ref 11.5–15.5)
WBC: 14.6 10*3/uL — ABNORMAL HIGH (ref 4.0–10.5)
nRBC: 0 % (ref 0.0–0.2)

## 2020-09-20 LAB — TROPONIN I (HIGH SENSITIVITY)
Troponin I (High Sensitivity): 3 ng/L (ref <18)
Troponin I (High Sensitivity): 438 ng/L (ref <18)
Troponin I (High Sensitivity): 1615 ng/L (ref <18)
Troponin I (High Sensitivity): 2569 ng/L (ref <18)

## 2020-09-20 LAB — POCT ACTIVATED CLOTTING TIME
Activated Clotting Time: 279 seconds
Activated Clotting Time: 279 seconds

## 2020-09-20 LAB — I-STAT CHEM 8, ED
BUN: 18 mg/dL (ref 8–23)
Calcium, Ion: 1.11 mmol/L — ABNORMAL LOW (ref 1.15–1.40)
Chloride: 108 mmol/L (ref 98–111)
Creatinine, Ser: 1 mg/dL (ref 0.61–1.24)
Glucose, Bld: 169 mg/dL — ABNORMAL HIGH (ref 70–99)
HCT: 46 % (ref 39.0–52.0)
Hemoglobin: 15.6 g/dL (ref 13.0–17.0)
Potassium: 4.7 mmol/L (ref 3.5–5.1)
Sodium: 139 mmol/L (ref 135–145)
TCO2: 23 mmol/L (ref 22–32)

## 2020-09-20 LAB — GLUCOSE, CAPILLARY
Glucose-Capillary: 110 mg/dL — ABNORMAL HIGH (ref 70–99)
Glucose-Capillary: 185 mg/dL — ABNORMAL HIGH (ref 70–99)

## 2020-09-20 LAB — RESP PANEL BY RT-PCR (FLU A&B, COVID) ARPGX2
Influenza A by PCR: NEGATIVE
Influenza B by PCR: NEGATIVE
SARS Coronavirus 2 by RT PCR: NEGATIVE

## 2020-09-20 LAB — MAGNESIUM: Magnesium: 1.8 mg/dL (ref 1.7–2.4)

## 2020-09-20 LAB — HIV ANTIBODY (ROUTINE TESTING W REFLEX): HIV Screen 4th Generation wRfx: NONREACTIVE

## 2020-09-20 SURGERY — LEFT HEART CATH AND CORONARY ANGIOGRAPHY
Anesthesia: LOCAL

## 2020-09-20 MED ORDER — SODIUM CHLORIDE 0.9% FLUSH
3.0000 mL | Freq: Two times a day (BID) | INTRAVENOUS | Status: DC
Start: 2020-09-20 — End: 2020-09-20

## 2020-09-20 MED ORDER — FAMOTIDINE IN NACL 20-0.9 MG/50ML-% IV SOLN
INTRAVENOUS | Status: AC | PRN
  Administered 2020-09-20: 20 mg via INTRAVENOUS

## 2020-09-20 MED ORDER — CLOPIDOGREL BISULFATE 300 MG PO TABS
ORAL_TABLET | ORAL | Status: DC | PRN
  Administered 2020-09-20: 600 mg via ORAL

## 2020-09-20 MED ORDER — MIDAZOLAM HCL 2 MG/2ML IJ SOLN
INTRAMUSCULAR | Status: AC
  Filled 2020-09-20: qty 2

## 2020-09-20 MED ORDER — SODIUM CHLORIDE 0.9% FLUSH: 3.0000 mL | Freq: Two times a day (BID) | INTRAVENOUS | Status: DC

## 2020-09-20 MED ORDER — SODIUM CHLORIDE 0.9 % IV SOLN: 250.0000 mL | INTRAVENOUS | Status: DC | PRN

## 2020-09-20 MED ORDER — LIDOCAINE HCL (PF) 1 % IJ SOLN
INTRAMUSCULAR | Status: DC | PRN
  Administered 2020-09-20: 2 mL

## 2020-09-20 MED ORDER — PREDNISONE 10 MG PO TABS
10.0000 mg | ORAL_TABLET | Freq: Every day | ORAL | Status: DC
  Administered 2020-09-21 – 2020-09-22 (×2): 10 mg via ORAL
  Filled 2020-09-20 (×2): qty 1

## 2020-09-20 MED ORDER — CALCIUM CARBONATE-VITAMIN D 500-200 MG-UNIT PO TABS
0.5000 | ORAL_TABLET | Freq: Two times a day (BID) | ORAL | Status: DC
  Administered 2020-09-20 – 2020-09-22 (×4): 0.5 via ORAL
  Filled 2020-09-20 (×5): qty 0.5

## 2020-09-20 MED ORDER — ASPIRIN 81 MG PO CHEW
81.0000 mg | CHEWABLE_TABLET | Freq: Every day | ORAL | Status: DC
  Administered 2020-09-21 – 2020-09-22 (×2): 81 mg via ORAL
  Filled 2020-09-20 (×2): qty 1

## 2020-09-20 MED ORDER — ACETAMINOPHEN 325 MG PO TABS: 650.0000 mg | ORAL_TABLET | ORAL | Status: DC | PRN

## 2020-09-20 MED ORDER — NITROGLYCERIN 1 MG/10 ML FOR IR/CATH LAB
INTRA_ARTERIAL | Status: AC
  Filled 2020-09-20: qty 10

## 2020-09-20 MED ORDER — HEPARIN SODIUM (PORCINE) 1000 UNIT/ML IJ SOLN
INTRAMUSCULAR | Status: DC | PRN
  Administered 2020-09-20: 3500 [IU] via INTRAVENOUS
  Administered 2020-09-20: 4000 [IU] via INTRAVENOUS

## 2020-09-20 MED ORDER — LIDOCAINE HCL (PF) 1 % IJ SOLN
INTRAMUSCULAR | Status: AC
  Filled 2020-09-20: qty 30

## 2020-09-20 MED ORDER — SODIUM CHLORIDE 0.9% FLUSH: 3.0000 mL | INTRAVENOUS | Status: DC | PRN

## 2020-09-20 MED ORDER — CLOPIDOGREL BISULFATE 300 MG PO TABS
ORAL_TABLET | ORAL | Status: AC
  Filled 2020-09-20: qty 1

## 2020-09-20 MED ORDER — ONDANSETRON HCL 4 MG/2ML IJ SOLN: 4.0000 mg | Freq: Four times a day (QID) | INTRAMUSCULAR | Status: DC | PRN

## 2020-09-20 MED ORDER — LABETALOL HCL 5 MG/ML IV SOLN: 10.0000 mg | INTRAVENOUS | Status: AC | PRN

## 2020-09-20 MED ORDER — MIDAZOLAM HCL 5 MG/5ML IJ SOLN
INTRAMUSCULAR | Status: AC | PRN
  Administered 2020-09-20: 2 mg via INTRAVENOUS

## 2020-09-20 MED ORDER — HEPARIN (PORCINE) IN NACL 1000-0.9 UT/500ML-% IV SOLN
INTRAVENOUS | Status: AC
  Filled 2020-09-20: qty 1000

## 2020-09-20 MED ORDER — AMIODARONE HCL IN DEXTROSE 360-4.14 MG/200ML-% IV SOLN
60.0000 mg/h | INTRAVENOUS | Status: AC
  Filled 2020-09-20: qty 200

## 2020-09-20 MED ORDER — ENOXAPARIN SODIUM 40 MG/0.4ML IJ SOSY
40.0000 mg | PREFILLED_SYRINGE | INTRAMUSCULAR | Status: DC
  Administered 2020-09-21: 40 mg via SUBCUTANEOUS
  Filled 2020-09-20: qty 0.4

## 2020-09-20 MED ORDER — HYDRALAZINE HCL 20 MG/ML IJ SOLN: 10.0000 mg | INTRAMUSCULAR | Status: AC | PRN

## 2020-09-20 MED ORDER — INSULIN ASPART 100 UNIT/ML IJ SOLN: 0.0000 [IU] | Freq: Every day | INTRAMUSCULAR | Status: DC

## 2020-09-20 MED ORDER — ADULT MULTIVITAMIN W/MINERALS CH
1.0000 | ORAL_TABLET | Freq: Every day | ORAL | Status: DC
  Administered 2020-09-20 – 2020-09-22 (×3): 1 via ORAL
  Filled 2020-09-20 (×3): qty 1

## 2020-09-20 MED ORDER — INSULIN ASPART 100 UNIT/ML IJ SOLN
0.0000 [IU] | Freq: Three times a day (TID) | INTRAMUSCULAR | Status: DC
Start: 2020-09-20 — End: 2020-09-22
  Administered 2020-09-21: 2 [IU] via SUBCUTANEOUS
  Administered 2020-09-21: 3 [IU] via SUBCUTANEOUS

## 2020-09-20 MED ORDER — NITROGLYCERIN 0.4 MG SL SUBL: 0.4000 mg | SUBLINGUAL_TABLET | SUBLINGUAL | Status: DC | PRN

## 2020-09-20 MED ORDER — AMIODARONE HCL IN DEXTROSE 360-4.14 MG/200ML-% IV SOLN
30.0000 mg/h | INTRAVENOUS | Status: DC
  Administered 2020-09-21 – 2020-09-22 (×3): 30 mg/h via INTRAVENOUS
  Filled 2020-09-20 (×2): qty 200

## 2020-09-20 MED ORDER — CALCIUM CARBONATE-VITAMIN D 250-125 MG-UNIT PO TABS: 1.0000 | ORAL_TABLET | Freq: Two times a day (BID) | ORAL | Status: DC

## 2020-09-20 MED ORDER — ATORVASTATIN CALCIUM 40 MG PO TABS
40.0000 mg | ORAL_TABLET | Freq: Every day | ORAL | Status: DC
  Administered 2020-09-20: 40 mg via ORAL
  Filled 2020-09-20: qty 1

## 2020-09-20 MED ORDER — SODIUM CHLORIDE 0.9 % IV SOLN
INTRAVENOUS | Status: DC
Start: 2020-09-20 — End: 2020-09-20

## 2020-09-20 MED ORDER — ALPRAZOLAM 0.25 MG PO TABS: 0.2500 mg | ORAL_TABLET | Freq: Two times a day (BID) | ORAL | Status: DC | PRN

## 2020-09-20 MED ORDER — IOHEXOL 350 MG/ML SOLN
INTRAVENOUS | Status: DC | PRN
  Administered 2020-09-20: 65 mL via INTRA_ARTERIAL

## 2020-09-20 MED ORDER — CLOPIDOGREL BISULFATE 75 MG PO TABS
75.0000 mg | ORAL_TABLET | Freq: Every day | ORAL | Status: DC
  Administered 2020-09-21 – 2020-09-22 (×2): 75 mg via ORAL
  Filled 2020-09-20 (×2): qty 1

## 2020-09-20 MED ORDER — DEXTROSE 5 % IV SOLN
INTRAVENOUS | Status: DC | PRN
  Administered 2020-09-20: 150 mg via INTRAVENOUS

## 2020-09-20 MED ORDER — VERAPAMIL HCL 2.5 MG/ML IV SOLN
INTRAVENOUS | Status: AC
  Filled 2020-09-20: qty 2

## 2020-09-20 MED ORDER — SODIUM CHLORIDE 0.9 % IV SOLN: INTRAVENOUS | Status: AC

## 2020-09-20 MED ORDER — HEPARIN (PORCINE) IN NACL 1000-0.9 UT/500ML-% IV SOLN
INTRAVENOUS | Status: DC | PRN
  Administered 2020-09-20 (×2): 500 mL

## 2020-09-20 MED ORDER — AMIODARONE HCL IN DEXTROSE 360-4.14 MG/200ML-% IV SOLN
60.0000 mg/h | INTRAVENOUS | Status: AC
  Administered 2020-09-20 (×2): 60 mg/h via INTRAVENOUS
  Filled 2020-09-20: qty 200

## 2020-09-20 MED ORDER — HEPARIN SODIUM (PORCINE) 1000 UNIT/ML IJ SOLN
INTRAMUSCULAR | Status: AC
  Filled 2020-09-20: qty 1

## 2020-09-20 MED ORDER — MOMETASONE FURO-FORMOTEROL FUM 200-5 MCG/ACT IN AERO
2.0000 | INHALATION_SPRAY | Freq: Two times a day (BID) | RESPIRATORY_TRACT | Status: DC
  Administered 2020-09-20 – 2020-09-22 (×4): 2 via RESPIRATORY_TRACT
  Filled 2020-09-20: qty 8.8

## 2020-09-20 MED ORDER — VERAPAMIL HCL 2.5 MG/ML IV SOLN
INTRAVENOUS | Status: DC | PRN
  Administered 2020-09-20 (×2): 5 mL via INTRA_ARTERIAL

## 2020-09-20 MED ORDER — FAMOTIDINE IN NACL 20-0.9 MG/50ML-% IV SOLN
INTRAVENOUS | Status: AC
  Filled 2020-09-20: qty 50

## 2020-09-20 MED ORDER — MORPHINE SULFATE (PF) 2 MG/ML IV SOLN: 2.0000 mg | INTRAVENOUS | Status: DC | PRN

## 2020-09-20 MED ORDER — ALBUTEROL SULFATE HFA 108 (90 BASE) MCG/ACT IN AERS
2.0000 | INHALATION_SPRAY | RESPIRATORY_TRACT | Status: DC | PRN
  Filled 2020-09-20: qty 6.7

## 2020-09-20 MED ORDER — ZOLPIDEM TARTRATE 5 MG PO TABS: 5.0000 mg | ORAL_TABLET | Freq: Every evening | ORAL | Status: DC | PRN

## 2020-09-20 MED ORDER — AMIODARONE HCL IN DEXTROSE 360-4.14 MG/200ML-% IV SOLN
30.0000 mg/h | INTRAVENOUS | Status: DC
  Filled 2020-09-20: qty 200

## 2020-09-20 MED ORDER — PANTOPRAZOLE SODIUM 20 MG PO TBEC
20.0000 mg | DELAYED_RELEASE_TABLET | Freq: Every day | ORAL | Status: DC
  Administered 2020-09-20 – 2020-09-22 (×3): 20 mg via ORAL
  Filled 2020-09-20 (×3): qty 1

## 2020-09-20 MED ORDER — SODIUM CHLORIDE 0.9% FLUSH
3.0000 mL | Freq: Two times a day (BID) | INTRAVENOUS | Status: DC
  Administered 2020-09-21 (×2): 3 mL via INTRAVENOUS

## 2020-09-20 MED ORDER — ASPIRIN 81 MG PO CHEW: 81.0000 mg | CHEWABLE_TABLET | ORAL | Status: DC

## 2020-09-20 MED ORDER — ASPIRIN EC 81 MG PO TBEC: 81.0000 mg | DELAYED_RELEASE_TABLET | Freq: Every day | ORAL | Status: DC

## 2020-09-20 SURGICAL SUPPLY — 21 items
BALLN SAPPHIRE 2.0X12 (BALLOONS) ×2
BALLN SAPPHIRE ~~LOC~~ 2.5X15 (BALLOONS) ×2 IMPLANT
BALLOON SAPPHIRE 2.0X12 (BALLOONS) ×1 IMPLANT
CATH INFINITI 5FR ANG PIGTAIL (CATHETERS) ×2 IMPLANT
CATH OPTITORQUE TIG 4.0 5F (CATHETERS) ×2 IMPLANT
CATH VISTA GUIDE 6FR JR4 (CATHETERS) ×2 IMPLANT
DEVICE RAD COMP TR BAND LRG (VASCULAR PRODUCTS) ×2 IMPLANT
ELECT DEFIB PAD ADLT CADENCE (PAD) ×2 IMPLANT
GLIDESHEATH SLEND A-KIT 6F 22G (SHEATH) ×2 IMPLANT
GUIDEWIRE INQWIRE 1.5J.035X260 (WIRE) ×1 IMPLANT
INQWIRE 1.5J .035X260CM (WIRE) ×2
KIT ENCORE 26 ADVANTAGE (KITS) ×2 IMPLANT
KIT HEART LEFT (KITS) ×2 IMPLANT
PACK CARDIAC CATHETERIZATION (CUSTOM PROCEDURE TRAY) ×2 IMPLANT
STENT RESOLUTE ONYX 2.25X18 (Permanent Stent) ×2 IMPLANT
TRANSDUCER W/STOPCOCK (MISCELLANEOUS) ×2 IMPLANT
TUBING ART PRESS 72  MALE/FEM (TUBING) ×2
TUBING ART PRESS 72 MALE/FEM (TUBING) ×1 IMPLANT
TUBING CIL FLEX 10 FLL-RA (TUBING) ×2 IMPLANT
WIRE ASAHI PROWATER 180CM (WIRE) ×2 IMPLANT
WIRE HI TORQ VERSACORE-J 145CM (WIRE) ×2 IMPLANT

## 2020-09-20 NOTE — ED Triage Notes (Signed)
Pt bib ems from home with reports of acute onset chest pain. HX MI. Pt found to be in vt. 150mg  amio bolus given en route. Pt alert and oriented.  18g RFA

## 2020-09-20 NOTE — H&P (Addendum)
Cardiology Admission History and Physical:   Patient ID: [name] MRN: [id number]; DOB: 1960/07/29   Admission date: 09/20/2020  PCP:  Danna Hefty, DO   [facility] Providers Cardiologist:  Lauree Chandler, MD 08/11/2019:1}     Chief Complaint:  VT  Patient Profile:   [name] is a 60 y.o. male with CAD, GERD, DM, asthma, hyperlipidemia. In 2008 he had an anterior MI treated with a drug eluting stent in the LAD. His ejection fraction was 45% at that time. Myoview 2010 showed no ischemia and showed an ejection fraction of 40%. Echo January 2016 with LVEF=40%. Apical hypokinesis. No LV thrombus seen. Echo March 2021 with LVEF=35-40%. He has been on Entresto.    He is being seen 09/20/2020 for the evaluation of VT and chest pain.  History of Present Illness:   [name] has been in his usual state of health recently.  He has not had any dyspnea on exertion, denies orthopnea or PND.  No lower extremity edema.  He has not had any chest pain.  He does not exercise, but has recently been training for a new job and has been walking around without any symptoms at all.  Today, he was sleeping and was awakened by chest pressure and palpitations.  The pressure was a 10/10 and reminded him of his MI in 2008.  It was not associated with shortness of breath, nausea, vomiting, or diaphoresis.  He had palpitations and was a little lightheaded.  This was about 930.  He alerted his son about 50 and EMS was called.  He was in a wide-complex tachycardia and was given amiodarone 150 mg IV in route to the hospital.  In the hospital, he was unstable with hypotension (SBP 70s), chest pain, and VT.  He was sedated and defibrillated at 100 J into sinus rhythm.  Once he was put back into sinus rhythm, his chest pain resolved.  His chest pain reminded him of his MI, he has not had it in years.   Past Medical History:  Diagnosis Date  . Asthma   . CAD (coronary artery disease)     a. CAD s/p anterior MI in 2008 treated with DES to the LAD.  Marland Kitchen Cataract 2018   both eyes  . Cellulitis of leg, right 10/2014  . COPD (chronic obstructive pulmonary disease) ([place])   . Dental caries   . Diabetes mellitus    TYPE 2  . GERD (gastroesophageal reflux disease)   . Hyperlipidemia   . Hypertension 2017  . Ischemic cardiomyopathy   . Leukocytosis    noted on labs  . Microcytic anemia   . Myocardial infarction ([place]) 2008  . Reactive airway disease   . Thyroid nodule    biopsy negative 2006    Past Surgical History:  Procedure Laterality Date  . CORONARY STENT PLACEMENT    . ESOPHAGOGASTRODUODENOSCOPY N/A 03/29/2017   Procedure: ESOPHAGOGASTRODUODENOSCOPY (EGD);  Surgeon: Milus Banister, MD;  Location[facility] ENDOSCOPY;  Service: Endoscopy;  Laterality: N/A;  . ESOPHAGOGASTRODUODENOSCOPY N/A 03/31/2018   Procedure: ESOPHAGOGASTRODUODENOSCOPY (EGD);  Surgeon: Doran Stabler, MD;  Location: [facility];  Service: Gastroenterology;  Laterality: N/A;  . FOREIGN BODY REMOVAL N/A 03/31/2018   Procedure: FOREIGN BODY REMOVAL;  Surgeon: Doran Stabler, MD;  Location: [facility];  Service: Gastroenterology;  Laterality: N/A;  . solitary pulm and thryoid nodules       Medications Prior to Admission: Prior to Admission medications   Medication Sig Start  Date End Date Taking? Authorizing Provider  acetaminophen (TYLENOL) 500 MG tablet Take 2 tablets (1,000 mg total) by mouth every 8 (eight) hours. 04/11/20  Yes Anderson, Hannah C, DO  ADVAIR HFA 230-21 MCG/ACT inhaler INHALE 2 PUFFS INTO THE LUNGS 2 TIMES A DAY. Patient taking differently: Inhale 2 puffs into the lungs 2 (two) times daily. 09/10/20  Yes Tanda Rockers, MD  aspirin EC 81 MG tablet Take 81 mg by mouth daily.   Yes [provider]  atorvastatin (LIPITOR) 40 MG tablet TAKE 1 TABLET BY MOUTH AT BEDTIME Patient taking differently: Take 40 mg by mouth at bedtime. 07/27/20  Yes Mullis, Kiersten P, DO   calcium-vitamin D (OSCAL WITH D) 250-125 MG-UNIT tablet Take 1 tablet by mouth 2 (two) times daily.   Yes [provider]  FARXIGA 10 MG TABS tablet TAKE 1 TABLET (10MG  TOTAL) BY MOUTH DAILY. Patient taking differently: Take 10 mg by mouth daily. 07/12/20  Yes Mullis, Kiersten P, DO  ferrous sulfate 325 (65 FE) MG tablet Take 325 mg by mouth daily with breakfast.   Yes [provider]  metFORMIN (GLUCOPHAGE) 1000 MG tablet Take 1 tablet (1,000 mg total) by mouth 2 (two) times daily with a meal. 03/27/20  Yes Mullis, Kiersten P, DO  Multiple Vitamin (MULTIVITAMIN WITH MINERALS) TABS tablet Take 1 tablet by mouth daily.   Yes [provider]  naproxen (NAPROSYN) 375 MG tablet Take 1 tablet (375 mg total) by mouth 2 (two) times daily with a meal. 04/11/20  Yes Milus Banister C, DO  pantoprazole (PROTONIX) 20 MG tablet Take 1 tablet (20 mg total) by mouth daily. Patient taking differently: Take 20 mg by mouth daily as needed for heartburn or indigestion. 06/30/19  Yes Lamptey, Myrene Galas, MD  predniSONE (DELTASONE) 10 MG tablet Take 1 tablet by mouth once daily with breakfast Patient taking differently: Take 10 mg by mouth daily with breakfast. 07/27/20  Yes Mullis, Kiersten P, DO  PROAIR HFA 108 (90 Base) MCG/ACT inhaler INHALE 2 PUFFS INTO THE LUNGS EVERY 4 HOURS AS NEEDED FOR WHEEZING OR SHORTNESS OF BREATH (PLAN B). Patient taking differently: Inhale 2 puffs into the lungs every 4 (four) hours as needed for wheezing or shortness of breath. 07/31/20  Yes Tanda Rockers, MD  sacubitril-valsartan (ENTRESTO) 97-103 MG Take 1 tablet by mouth 2 (two) times daily. 01/13/20  Yes Burnell Blanks, MD     Allergies:   No Known Allergies  Social History:   Social History   Socioeconomic History  . Marital status: Married    Spouse name: Not on file  . Number of children: 3  . Years of education: Not on file  . Highest education level: Not on file  Occupational History   . Not on file  Tobacco Use  . Smoking status: Former Smoker    Packs/day: 0.50    Years: 22.00    Pack years: 11.00    Types: Cigarettes    Quit date: 04/21/2006    Years since quitting: 14.4  . Smokeless tobacco: Never Used  . Tobacco comment: quit 10 years ago  Vaping Use  . Vaping Use: Never used  Substance and Sexual Activity  . Alcohol use: No    Alcohol/week: 0.0 standard drinks  . Drug use: No  . Sexual activity: Yes    Birth control/protection: Condom  Other Topics Concern  . Not on file  Social History Narrative   Lives with wife; recently moved to [facility]  from [place] due to health problems. Has no means of affording meds currently + tobacco 1/2ppd x 48yrs, no ETOH no drugs.   Social Determinants of Health   Financial Resource Strain: Not on file  Food Insecurity: Not on file  Transportation Needs: Not on file  Physical Activity: Not on file  Stress: Not on file  Social Connections: Not on file  Intimate Partner Violence: Not on file    Family History:   The patient's family history includes Diabetes in his mother; Heart attack (age of onset: 43) in his brother. There is no history of Colon cancer, Esophageal cancer, Rectal cancer, or Stomach cancer.   He indicated that his mother is deceased. He indicated that his father is deceased. He indicated that the status of his brother is unknown. He indicated that his maternal grandmother is deceased. He indicated that his maternal grandfather is deceased. He indicated that his paternal grandmother is deceased. He indicated that his paternal grandfather is deceased. He indicated that the status of his neg hx is unknown.  ROS:  Please see the history of present illness.  All other ROS reviewed and negative.     Physical Exam/Data:   Vitals:   09/20/20 1230 09/20/20 1245 09/20/20 1300 09/20/20 1330  BP: 97/66 106/62 100/81 102/65  Pulse: 81 84 81 74  Resp: 18 18 18  (!) 21  Temp:      TempSrc:      SpO2: 98% 97% 98% 98%    No intake or output data in the 24 hours ending 09/20/20 1413 Last 3 Weights 08/07/2020 04/11/2020 03/30/2020  Weight (lbs) 147 lb 6.4 oz 156 lb 6.4 oz 155 lb 8 oz  Weight (kg) 66.86 kg 70.943 kg 70.534 kg     There is no height or weight on file to calculate BMI.  General:  Well nourished, well developed, in no acute distress HEENT: normal Lymph: no adenopathy Neck: no JVD Endocrine:  No thryomegaly Vascular: No carotid bruits; 4/4 extremity pulses 2+   Cardiac:  normal S1, S2; RRR; no murmur  Lungs:  clear to auscultation bilaterally, no wheezing, rhonchi or rales  Abd: soft, nontender, no hepatomegaly  Ext: no edema Musculoskeletal:  No deformities, BUE and BLE strength normal and equal Skin: warm and dry  Neuro:  CNs 2-12 intact, no focal abnormalities noted Psych:  Normal affect    EKG:  The ECG was personally reviewed  10:29 ECG is ventricular tachycardia, heart rate 197, negative deflection in V2- 6 1043 ECG is sinus rhythm, heart rate 67, Q waves in inferior leads and V3-6 more lateral T waves, slightly different from 06/2019  Relevant CV Studies:  ECHO: 07/02/2019  1. No apical mural thrombus seen. Left ventricular ejection fraction, by  estimation, is 35 to 40%. The left ventricle has moderately decreased  function. The left ventricle demonstrates regional wall motion  abnormalities (see scoring diagram/findings for   description). There is moderate left ventricular hypertrophy. Left  ventricular diastolic parameters are consistent with Grade I diastolic  dysfunction (impaired relaxation). Elevated left ventricular end-diastolic  pressure. There is severe akinesis of  the left ventricular, entire anterior wall, anteroseptal wall, apical  segment and inferoapical segment.   2. Right ventricular systolic function is normal. The right ventricular  size is normal.   3. The mitral valve is grossly normal. Trivial mitral valve  regurgitation.   4. The aortic valve is  tricuspid. Aortic valve regurgitation is not  visualized.   5. The inferior vena  cava is normal in size with greater than 50%  respiratory variability, suggesting right atrial pressure of 3 mmHg.   EXERCISE MYOVIEW: 05/01/2009 Stress Procedure  The patient exercised for 7;08. The patient stopped due to extreme SOB, fatigue,  and denied any chest pain.  There were no significant ST-T wave changes.  Myoview was injected at peak exercise and myocardial perfusion imaging was performed after a brief delay.   QPS Raw Data Images:  Normal; no motion artifact; normal heart/lung ratio. Stress Images:  There is no uptake in the mid to distal anterior wall, anteroseptum, apex and distal inferior wall Rest Images:  There is no uptake in the mid to distal anterior wall, anteroseptum, apex and distal inferior wall Subtraction (SDS):  Previous anterior, apical and distal inferior MI. No signficant ischemia. Transient Ischemic Dilatation:  1.06  (Normal <1.22)  Lung/Heart Ratio:  .34  (Normal <0.45)   Quantitative Gated Spect Images QGS EDV:  120 ml QGS ESV:  72 ml QGS EF:  40 % QGS cine images:  Previous anterior, apical and distal inferior MI with aneurysmal dilation.    Findings Abnormal nuclear study   Evidence for inferior infarct  Evidence for LV Dysfunction LV Dysfunction      Overall Impression   Exercise Capacity: Fair exercise capacity. BP Response: Hypertensive blood pressure response. Clinical Symptoms: Extreme fatigue ECG Impression: No significant ST segment change suggestive of ischemia. Overall Impression: Abnormal stress nuclear study. Overall Impression Comments: Previous anterior, apical and distal inferior MI. No signficant ischemia. Increased RV traced uptake suggestive of elevated R-sided pressures.   Appended Document: Cardiology Nuclear Study Addendum: Symptoms included dyspnea and wheezing. Given albuterol inhaler. O2 sat by pulse ox did not go below 93%.  CARDIAC  CATH: 02/18/2007  The patient had been given chewable aspirin and Plavix and heparin in  the emergency department.  He was given Angiomax bolus and infusion.  We  used a Q 3.5, 6-French guiding catheter with side holes.  We cross the  lesion without too much difficulty with a PT2 light support wire.  We  dilated the lesion with a 2.0 x 50 mm Maverick, performing one inflation  to 10 atmospheres for 30 seconds.  We then deployed a 2.75 x 23 mm  Promus stent, performing this with one inflammation to 14 atmospheres  for 30 seconds.  This crossed a moderate sized diagonal branch.  We then  performed IVUS run with an [facility] IVUS catheter and automatic pullback.  This documented the distal reference diameter was 2.5 to 2.75 and the  proximal reference diameter was 3.75.  The stent was not quite totally  opposed in its very proximal edges in the proximal few millimeters.  Based on the IVUS findings, we went back in with a 3.5 x 20 mm Quantum  Maverick and performed two inflations up to 16 atmospheres for 30  seconds avoiding the very distal edge of the stent.  We then went in  with a 3.75 x 12 mm Quantum Maverick and performed two inflations up to  16 atmospheres for 30 seconds in the proximal aspect of the stent.  We  then performed the final IVUS run which showed good transition at the  proximal and distal edges, a well expanded stent in full apposition.  The right coronary artery was closed with Angio-Seal at the end of the  procedure.  The patient tolerated the procedure well and left the  laboratory in satisfactory condition.    RESULTS:  Left  Main Coronary Artery:  Left main coronary artery was free  of significant disease.    Left Anterior Descending Artery:  Left anterior descending artery gave  rise to a small to moderate sized diagonal branch and the septal  perforating end was completely occluded.  This LAD filled via septum  with septal collaterals.    Left Circumferential  Artery:  Left circumflex artery gave rise to  marginal branch, an atrial branch and a posterolateral branches.  There  was 90% opposite stenosis in the marginal branch.    Right Coronary Artery:  The right coronary artery is moderate size  vessel that gave rise to two right ventricular branches and posterior  descending branches and two posterolateral branches.  There were  irregularities but no significant obstruction.    Left Ventriculogram:  The left ventriculogram performed in the RAO  projection showed akinesis of the anterolateral on apex extending about  1/3 of the way and including this distal 1/3 of the inferior wall.  The  estimated ejection fraction was 35%.    Following stenting of the lesion, the LAD of central narrowing from 100%  to 0% and the flow improved from TIMI 0 to TIMI 3 flow.    CONCLUSION:  1. Acute anterior wall myocardial infarction with total occlusion of      the left anterior descending artery, 90% opposite stenosis of the      marginal branch of circumflex artery, no significant obstruction of      the right coronary artery and anterolateral and apical akinesis      with an estimated ejection fraction of 35%.  2. Successful reperfusion and stenting of the lesion in the proximal      to mid left anterior descending with improvement of central      narrowing from 100% to 0% and improvement of flow from TIMI 0 to      TIMI 3 flow using a Promus drug-eluting stent.  Laboratory Data:  High Sensitivity Troponin:   Recent Labs  Lab 09/20/20 1034  TROPONINIHS 3      Chemistry Recent Labs  Lab 09/20/20 1034 09/20/20 1046  NA 136 139  K 4.9 4.7  CL 107 108  CO2 19*  --   GLUCOSE 191* 169*  BUN 13 18  CREATININE 1.07 1.00  CALCIUM 8.9  --   GFRNONAA >60  --   ANIONGAP 10  --     Magnesium  Date Value Ref Range Status  09/20/2020 1.8 1.7 - 2.4 mg/dL Final    Comment:    Performed at [facility], [place] [address] [place], [place]  [address]  07/02/2019 2.1 1.7 - 2.4 mg/dL Final    Comment:    Performed at [facility] [address], [place], [place] [address]  07/01/2019 1.2 (L) 1.7 - 2.4 mg/dL Final    Comment:    Performed at [facility] [address] [place], [place] [address]    Recent Labs  Lab 09/20/20 1034  PROT 6.8  ALBUMIN 3.6  AST 47*  ALT 19  ALKPHOS 39  BILITOT 1.1   Hematology Recent Labs  Lab 09/20/20 1034 09/20/20 1046  WBC 14.6*  --   RBC 6.18*  --   HGB 14.7 15.6  HCT 47.6 46.0  MCV 77.0*  --   MCH 23.8*  --   MCHC 30.9  --   RDW 15.3  --   PLT 269  --  BNPNo results for input(s): BNP, PROBNP in the last 168 hours.  DDimer No results for input(s): DDIMER in the last 168 hours. Lab Results  Component Value Date   TSH 1.165 06/30/2019   Lab Results  Component Value Date   HGBA1C 7.5 (A) 08/07/2020   Lab Results  Component Value Date   CHOL 129 01/10/2020   HDL 57 01/10/2020   LDLCALC 38 01/10/2020   LDLDIRECT 91 08/14/2017   TRIG 222 (H) 01/10/2020   CHOLHDL 2.3 01/10/2020     Radiology/Studies:  DG Chest Port 1 View  Result Date: 09/20/2020 CLINICAL DATA:  Chest pain. EXAM: PORTABLE CHEST 1 VIEW COMPARISON:  Aug 31, 2019. FINDINGS: The heart size and mediastinal contours are within normal limits. Both lungs are clear. The visualized skeletal structures are unremarkable. IMPRESSION: No active disease. Electronically Signed   By: Marijo Conception M.D.   On: 09/20/2020 10:54     Assessment and Plan:   1. VT: -Potential causes include ischemia, electrolyte imbalances, or scar - Prior to this event, he was having no ischemic symptoms and his initial troponin is negative - EF is known to be 35-40%, recheck - Keep potassium greater than 4 and magnesium greater than 2 >> supp Mg - Continue IV amiodarone for now - Discuss further evaluation with MD.  2.  CAD: - His last cath was 2008, his last stress test was 2011 - In the setting of VT, MD advise  on ruling out an ischemic cause with cardiac cath - Cardiac catheterization was discussed with the patient and his son fully. The patient understands that risks include but are not limited to stroke (1 in 1000), death (1 in 63), kidney failure [usually temporary] (1 in 500), bleeding (1 in 200), allergic reaction [possibly serious] (1 in 200).  The patient understands and is willing to proceed.    3. Chronic systolic CHF - not on diuretic pta - volume status good by exam - with BP issues today, hold Entresto 97-103 - he has not been on aldactone or BB (but is on inhalers and chronic steroids for lung dz)  4. DM - hold home metformin and Farxiga - use SSI - recent A1c 7.5  5. HTN - SBP 100s or less today - hold rx and follow, restart as BP will allow  6. HLD - ck profile, continue statin    Risk Assessment/Risk Scores:    HEAR Score (for undifferentiated chest pain):  HEAR Score: 5  New York Heart Association (NYHA) Functional Class NYHA Class II    For questions or updates, please contact [facility] Please consult [email or web address] for contact info under     Signed, Rosaria Ferries, PA-C  09/20/2020 2:13 PM   I have seen and examined the patient along with Rosaria Ferries, PA-C .  I have reviewed the chart, notes and new data.  I agree with PA/NP's note.  Key new complaints: The patient experienced palpitations before the onset of angina; angina resolved promptly after successful cardioversion of ventricular tachycardia.  He has not had exertional angina or dyspnea in the last several years. Key examination changes: Mild lateral displacement of the apical impulse, otherwise normal cardiovascular exam.  Clinically euvolemic without findings that would suggest heart failure. 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 Initial troponin is 3 second troponin is slightly elevated at 438 (after VT and defibrillation shock).  Electrolytes are normal.  CBC suggest thalassemia trait.  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 Recommend coronary angiography to make sure that the trigger for the arrhythmia is not a new coronary event, but the marginal increase in troponin can be readily attributed to the tachyarrhythmia and shock, is not in a range that would support a true atherothrombotic acute coronary event. If he does not require coronary revascularization, recommend implantation of a defibrillator for secondary prevention (sustained symptomatic VT). If revascularization is performed, may have to delay defibrillator implantation and recommend wearing a LifeVest for the next 3 months.  We will ask for EP opinion on this. The cardiac catheterization and possible coronary angioplasty-stent procedure has been fully reviewed with the patient and written informed consent has been obtained.   Sanda Klein, MD, [place] [phone] 09/20/2020, 3:07 PM

## 2020-09-20 NOTE — ED Notes (Signed)
Pt now in NSR

## 2020-09-20 NOTE — ED Provider Notes (Signed)
[facility] EMERGENCY DEPARTMENT Provider Note   CSN: [id number] Arrival date & time: 09/20/20  1025     History No chief complaint on file.   Gergory Biello is a 60 y.o. male.  Patient is a 60 year old male who presents with chest pain.  He has a history of prior coronary artery disease status post stent placement in 2008.  He was awoken this morning with chest pressure.  It is pressure across the center of his chest.  He has some shortness of breath associated with it and feeling like his heart is beating fast.  EMS arrived and noted that he was in ventricular tachycardia.  They have administered amiodarone 150 mg with no improvement.  He denies any dizziness.  He denies any similar symptoms in the past.  He denies any recent illnesses.  No fevers, coughing or cold.  No vomiting or diarrhea.        Past Medical History:  Diagnosis Date  . Asthma   . CAD (coronary artery disease)    a. CAD s/p anterior MI in 2008 treated with DES to the LAD.  Marland Kitchen Cataract 2018   both eyes  . Cellulitis of leg, right 10/2014  . COPD (chronic obstructive pulmonary disease) ([place])   . Dental caries   . Diabetes mellitus    TYPE 2  . GERD (gastroesophageal reflux disease)   . Hyperlipidemia   . Hypertension 2017  . Ischemic cardiomyopathy   . Leukocytosis    noted on labs  . Microcytic anemia   . Myocardial infarction ([place]) 2008  . Reactive airway disease   . Thyroid nodule    biopsy negative 2006    Patient Active Problem List   Diagnosis Date Noted  . Current chronic use of systemic steroids 08/07/2020  . Poor dentition 02/18/2020  . Former smoker 01/13/2020  . Syncope and collapse   . 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 GERD (gastroesophageal reflux disease) 10/26/2017  . Mixed hyperlipidemia 07/13/2017  . Primary hypertension 04/22/2017  . COPD with asthma ([place]) 12/18/2014  . Cataracts, bilateral 06/27/2014  .  Cardiomyopathy, ischemic 11/11/2011  . Uncontrolled type 2 diabetes mellitus, without long-term current use of insulin ([place]) 06/12/2009  . Atherosclerosis of native coronary artery of native heart 07/13/2008  . History of MI (myocardial infarction) 04/01/2007  . Sinusitis, chronic 06/18/2006    Past Surgical History:  Procedure Laterality Date  . CORONARY STENT PLACEMENT    . ESOPHAGOGASTRODUODENOSCOPY N/A 03/29/2017   Procedure: ESOPHAGOGASTRODUODENOSCOPY (EGD);  Surgeon: Milus Banister, MD;  Location: [facility] ENDOSCOPY;  Service: Endoscopy;  Laterality: N/A;  . ESOPHAGOGASTRODUODENOSCOPY N/A 03/31/2018   Procedure: ESOPHAGOGASTRODUODENOSCOPY (EGD);  Surgeon: Doran Stabler, MD;  Location: [facility];  Service: Gastroenterology;  Laterality: N/A;  . FOREIGN BODY REMOVAL N/A 03/31/2018   Procedure: FOREIGN BODY REMOVAL;  Surgeon: Doran Stabler, MD;  Location: [facility];  Service: Gastroenterology;  Laterality: N/A;  . solitary pulm and thryoid nodules         Family History  Problem Relation Age of Onset  . Diabetes Mother   . Heart attack Brother 2  . Colon cancer Neg Hx   . Esophageal cancer Neg Hx   . Rectal cancer Neg Hx   . Stomach cancer Neg Hx     Social History   Tobacco Use  . Smoking status: Former Smoker    Packs/day: 0.50    Years: 22.00  Pack years: 11.00    Types: Cigarettes    Quit date: 04/21/2006    Years since quitting: 14.4  . Smokeless tobacco: Never Used  . Tobacco comment: quit 10 years ago  Vaping Use  . Vaping Use: Never used  Substance Use Topics  . Alcohol use: No    Alcohol/week: 0.0 standard drinks  . Drug use: No    Home Medications Prior to Admission medications   Medication Sig Start Date End Date Taking? Authorizing Provider  acetaminophen (TYLENOL) 500 MG tablet Take 2 tablets (1,000 mg total) by mouth every 8 (eight) hours. 04/11/20   Daisy Floro, DO  ADVAIR HFA [phone] MCG/ACT inhaler INHALE 2 PUFFS INTO THE  LUNGS 2 TIMES A DAY. 09/10/20   Tanda Rockers, MD  aspirin EC 81 MG tablet Take 81 mg by mouth daily.    [provider]  atorvastatin (LIPITOR) 40 MG tablet TAKE 1 TABLET BY MOUTH AT BEDTIME 07/27/20   Mullis, Kiersten P, DO  calcium-vitamin D (OSCAL WITH D) 250-125 MG-UNIT tablet Take 1 tablet by mouth 2 (two) times daily.    [provider]  FARXIGA 10 MG TABS tablet TAKE 1 TABLET (10MG  TOTAL) BY MOUTH DAILY. 07/12/20   Mullis, Kiersten P, DO  Ferrous Sulfate (IRON) 325 (65 Fe) MG TABS Take 1 tablet (325 mg total) by mouth daily. 01/10/20 02/09/20  Mullis, Kiersten P, DO  metFORMIN (GLUCOPHAGE) 1000 MG tablet Take 1 tablet (1,000 mg total) by mouth 2 (two) times daily with a meal. 03/27/20   Mullis, Kiersten P, DO  Multiple Vitamin (MULTIVITAMIN WITH MINERALS) TABS tablet Take 1 tablet by mouth daily.    [provider]  naproxen (NAPROSYN) 375 MG tablet Take 1 tablet (375 mg total) by mouth 2 (two) times daily with a meal. 04/11/20   Milus Banister C, DO  pantoprazole (PROTONIX) 20 MG tablet Take 1 tablet (20 mg total) by mouth daily. 06/30/19   Chase Picket, MD  predniSONE (DELTASONE) 10 MG tablet Take 1 tablet by mouth once daily with breakfast 07/27/20   Mullis, Kiersten P, DO  PROAIR HFA 108 (90 Base) MCG/ACT inhaler INHALE 2 PUFFS INTO THE LUNGS EVERY 4 HOURS AS NEEDED FOR WHEEZING OR SHORTNESS OF BREATH (PLAN B). 07/31/20   Tanda Rockers, MD  sacubitril-valsartan (ENTRESTO) 97-103 MG Take 1 tablet by mouth 2 (two) times daily. 01/13/20   Burnell [facility], MD    Allergies    Patient has no known allergies.  Review of Systems   Review of Systems  Constitutional: Negative for chills, diaphoresis, fatigue and fever.  HENT: Negative for congestion, rhinorrhea and sneezing.   Eyes: Negative.   Respiratory: Positive for chest tightness and shortness of breath. Negative for cough.   Cardiovascular: Positive for chest pain. Negative for leg swelling.   Gastrointestinal: Negative for abdominal pain, blood in stool, diarrhea, nausea and vomiting.  Genitourinary: Negative for difficulty urinating, flank pain, frequency and hematuria.  Musculoskeletal: Negative for arthralgias and back pain.  Skin: Negative for rash.  Neurological: Negative for dizziness, speech difficulty, weakness, numbness and headaches.    Physical Exam Updated Vital Signs BP (!) 93/58   Pulse 85   Temp (!) 97 F (36.1 C) (Temporal)   Resp 19   SpO2 96%   Physical Exam Constitutional:      Appearance: He is well-developed.  HENT:     Head: Normocephalic and atraumatic.  Eyes:     Pupils: Pupils are equal, round,  and reactive to light.  Cardiovascular:     Rate and Rhythm: Regular rhythm. Tachycardia present.     Heart sounds: Normal heart sounds.  Pulmonary:     Effort: Pulmonary effort is normal. No respiratory distress.     Breath sounds: Normal breath sounds. No wheezing or rales.  Chest:     Chest wall: No tenderness.  Abdominal:     General: Bowel sounds are normal.     Palpations: Abdomen is soft.     Tenderness: There is no abdominal tenderness. There is no guarding or rebound.  Musculoskeletal:        General: Normal range of motion.     Cervical back: Normal range of motion and neck supple.  Lymphadenopathy:     Cervical: No cervical adenopathy.  Skin:    General: Skin is warm and dry.     Findings: No rash.  Neurological:     Mental Status: He is alert and oriented to person, place, and time.     ED Results / Procedures / Treatments   Labs (all labs ordered are listed, but only abnormal results are displayed) Labs Reviewed  CBC WITH DIFFERENTIAL/PLATELET - Abnormal; Notable for the following components:      Result Value   WBC 14.6 (*)    RBC 6.18 (*)    MCV 77.0 (*)    MCH 23.8 (*)    Neutro Abs 7.9 (*)    Lymphs Abs 4.6 (*)    Monocytes Absolute 1.1 (*)    Eosinophils Absolute 0.9 (*)    Basophils Absolute 0.2 (*)    Abs  Immature Granulocytes 0.09 (*)    All other components within normal limits  COMPREHENSIVE METABOLIC PANEL - Abnormal; Notable for the following components:   CO2 19 (*)    Glucose, Bld 191 (*)    AST 47 (*)    All other components within normal limits  I-STAT CHEM 8, ED - Abnormal; Notable for the following components:   Glucose, Bld 169 (*)    Calcium, Ion 1.11 (*)    All other components within normal limits  RESP PANEL BY RT-PCR (FLU A&B, COVID) ARPGX2  MAGNESIUM  TROPONIN I (HIGH SENSITIVITY)    EKG EKG Interpretation  Date/Time:  Thursday September 20 2020 10:43:42 EDT Ventricular Rate:  75 PR Interval:  161 QRS Duration: 103 QT Interval:  373 QTC Calculation: 417 R Axis:   -65 Text Interpretation: Sinus rhythm Abnormal lateral Q waves Anterior infarct, recent ST elevation, consider inferior injury Confirmed by Malvin Johns [phone]) on 09/20/2020 10:48:30 AM   Radiology DG Chest Port 1 View  Result Date: 09/20/2020 CLINICAL DATA:  Chest pain. EXAM: PORTABLE CHEST 1 VIEW COMPARISON:  Aug 31, 2019. FINDINGS: The heart size and mediastinal contours are within normal limits. Both lungs are clear. The visualized skeletal structures are unremarkable. IMPRESSION: No active disease. Electronically Signed   By: Marijo Conception M.D.   On: 09/20/2020 10:54    Procedures .Cardioversion  Date/Time: 09/20/2020 11:42 AM Performed by: Malvin Johns, MD Authorized by: Malvin Johns, MD   Consent:    Consent obtained:  Emergent situation Pre-procedure details:    Cardioversion basis:  Emergent   Rhythm:  Ventricular tachycardia Patient sedated: No Attempt one:    Cardioversion mode:  Synchronous   Waveform:  Biphasic   Shock (Joules):  100 (100)   Shock outcome:  Conversion to normal sinus rhythm Post-procedure details:    Patient status:  Alert  Patient tolerance of procedure:  Tolerated well, no immediate complications     Medications Ordered in ED Medications  midazolam  (VERSED) 5 MG/5ML injection (2 mg Intravenous Given 09/20/20 1033)  midazolam (VERSED) 2 MG/2ML injection (has no administration in time range)  amiodarone (NEXTERONE PREMIX) 360-4.14 MG/200ML-% (1.8 mg/mL) IV infusion (has no administration in time range)    Followed by  amiodarone (NEXTERONE PREMIX) 360-4.14 MG/200ML-% (1.8 mg/mL) IV infusion (has no administration in time range)  amiodarone (NEXTERONE PREMIX) 360-4.14 MG/200ML-% (1.8 mg/mL) IV infusion (60 mg/hr Intravenous New Bag/Given 09/20/20 1042)  amiodarone (NEXTERONE PREMIX) 360-4.14 MG/200ML-% (1.8 mg/mL) IV infusion (has no administration in time range)    ED Course  I have reviewed the triage vital signs and the nursing notes.  Pertinent labs & imaging results that were available during my care of the patient were reviewed by me and considered in my medical decision making (see chart for details).    MDM Rules/Calculators/A&P                          Patient is a 60 year old male who presents with ventricular tachycardia.  He complains of chest pressure but otherwise is asymptomatic.  He initially appeared to be stable on arrival to the ED and was given a trial of amiodarone.  He been given initial dose by EMS and was given a second 150 mg bolus in the ED.  Subsequently, a he dropped his blood pressure and was hypotensive with a systolic blood pressure in the 70s.  He was still alert.  However given his drop in blood pressure, he was given a dose of Versed and cardioverted.  He converted to a sinus rhythm.  He felt much better after this.  He says his chest pain resolved.  A follow-up EKG had showed some potential mild increase in ST elevation as compared to her old EKG.  I contacted cardiology and Dr. Gwenlyn Found reviewed the EKG.  He felt that it was similar to her prior EKGs.  His labs are reviewed and are nonconcerning.  Cardiology was consulted to admit the patient.  CRITICAL CARE Performed by: Malvin Johns Total critical care time:  60 minutes Critical care time was exclusive of separately billable procedures and treating other patients. Critical care was necessary to treat or prevent imminent or life-threatening deterioration. Critical care was time spent personally by me on the following activities: development of treatment plan with patient and/or surrogate as well as nursing, discussions with consultants, evaluation of patient's response to treatment, examination of patient, obtaining history from patient or surrogate, ordering and performing treatments and interventions, ordering and review of laboratory studies, ordering and review of radiographic studies, pulse oximetry and re-evaluation of patient's condition.  Final Clinical Impression(s) / ED Diagnoses Final diagnoses:  Ventricular tachycardia [facility])    Rx / DC Orders ED Discharge Orders    None       Malvin Johns, MD 09/20/20 1145

## 2020-09-20 NOTE — Code Documentation (Signed)
defib at 100j

## 2020-09-21 ENCOUNTER — Encounter (HOSPITAL_COMMUNITY): Payer: Self-pay | Admitting: Cardiovascular Disease

## 2020-09-21 ENCOUNTER — Inpatient Hospital Stay (HOSPITAL_COMMUNITY): Payer: No Typology Code available for payment source

## 2020-09-21 DIAGNOSIS — E785 Hyperlipidemia, unspecified: Secondary | ICD-10-CM

## 2020-09-21 DIAGNOSIS — I214 Non-ST elevation (NSTEMI) myocardial infarction: Principal | ICD-10-CM

## 2020-09-21 DIAGNOSIS — I472 Ventricular tachycardia: Secondary | ICD-10-CM

## 2020-09-21 DIAGNOSIS — I5022 Chronic systolic (congestive) heart failure: Secondary | ICD-10-CM

## 2020-09-21 LAB — BASIC METABOLIC PANEL
Anion gap: 7 (ref 5–15)
BUN: 7 mg/dL — ABNORMAL LOW (ref 8–23)
CO2: 23 mmol/L (ref 22–32)
Calcium: 8.9 mg/dL (ref 8.9–10.3)
Chloride: 107 mmol/L (ref 98–111)
Creatinine, Ser: 1.03 mg/dL (ref 0.61–1.24)
GFR, Estimated: >60 mL/min (ref >60)
Glucose, Bld: 112 mg/dL — ABNORMAL HIGH (ref 70–99)
Potassium: 4.1 mmol/L (ref 3.5–5.1)
Sodium: 137 mmol/L (ref 135–145)

## 2020-09-21 LAB — CBC
HCT: 41.4 % (ref 39.0–52.0)
Hemoglobin: 13 g/dL (ref 13.0–17.0)
MCH: 23.8 pg — ABNORMAL LOW (ref 26.0–34.0)
MCHC: 31.4 g/dL (ref 30.0–36.0)
MCV: 75.7 fL — ABNORMAL LOW (ref 80.0–100.0)
Platelets: 203 10*3/uL (ref 150–400)
RBC: 5.47 MIL/uL (ref 4.22–5.81)
RDW: 15.2 % (ref 11.5–15.5)
WBC: 15.3 10*3/uL — ABNORMAL HIGH (ref 4.0–10.5)
nRBC: 0 % (ref 0.0–0.2)

## 2020-09-21 LAB — ECHOCARDIOGRAM COMPLETE
AR max vel: 2.28 cm2
AV Area VTI: 2.35 cm2
AV Area mean vel: 2.22 cm2
AV Mean grad: 3 mmHg
AV Peak grad: 6.3 mmHg
Ao pk vel: 1.25 m/s
Area-P 1/2: 3.85 cm2
Height: 67 in
S' Lateral: 4 cm
Weight: 2331.2 oz

## 2020-09-21 LAB — GLUCOSE, CAPILLARY
Glucose-Capillary: 119 mg/dL — ABNORMAL HIGH (ref 70–99)
Glucose-Capillary: 130 mg/dL — ABNORMAL HIGH (ref 70–99)
Glucose-Capillary: 179 mg/dL — ABNORMAL HIGH (ref 70–99)
Glucose-Capillary: 116 mg/dL — ABNORMAL HIGH (ref 70–99)

## 2020-09-21 LAB — LIPID PANEL
Cholesterol: 130 mg/dL (ref 0–200)
HDL: 40 mg/dL — ABNORMAL LOW (ref >40)
LDL Cholesterol: 70 mg/dL (ref 0–99)
Total CHOL/HDL Ratio: 3.3 RATIO
Triglycerides: 98 mg/dL (ref <150)
VLDL: 20 mg/dL (ref 0–40)

## 2020-09-21 MED ORDER — PERFLUTREN LIPID MICROSPHERE
1.0000 mL | INTRAVENOUS | Status: AC | PRN
  Administered 2020-09-21: 5 mL via INTRAVENOUS
  Filled 2020-09-21: qty 10

## 2020-09-21 MED ORDER — ATORVASTATIN CALCIUM 80 MG PO TABS
80.0000 mg | ORAL_TABLET | Freq: Every day | ORAL | Status: DC
  Administered 2020-09-21: 80 mg via ORAL
  Filled 2020-09-21: qty 1

## 2020-09-21 MED ORDER — BISOPROLOL FUMARATE 5 MG PO TABS
2.5000 mg | ORAL_TABLET | Freq: Every day | ORAL | Status: DC
  Administered 2020-09-21 – 2020-09-22 (×2): 2.5 mg via ORAL
  Filled 2020-09-21 (×2): qty 1

## 2020-09-21 MED ORDER — DAPAGLIFLOZIN PROPANEDIOL 10 MG PO TABS
10.0000 mg | ORAL_TABLET | Freq: Every day | ORAL | Status: DC
  Administered 2020-09-21 – 2020-09-22 (×2): 10 mg via ORAL
  Filled 2020-09-21 (×2): qty 1

## 2020-09-21 MED ORDER — MAGNESIUM SULFATE 2 GM/50ML IV SOLN
2.0000 g | Freq: Once | INTRAVENOUS | Status: AC
  Administered 2020-09-21: 2 g via INTRAVENOUS
  Filled 2020-09-21: qty 50

## 2020-09-21 NOTE — Consult Note (Addendum)
ELECTROPHYSIOLOGY CONSULT NOTE    Patient ID: [name] MRN: [id number], DOB/AGE: Jun 14, 1960 60 y.o.  Admit date: 09/20/2020 Date of Consult: 09/21/2020  Primary Physician: Danna Hefty, DO Primary Cardiologist: Lauree Chandler, MD  Electrophysiologist: New  Referring Provider: Dr. Sallyanne Kuster  Patient Profile: [name] is a 60 y.o. male with CAD, GERD, DM, asthma, hyperlipidemia. In 2008 he had an anterior MI treated with a drug eluting stent in the LAD. His ejection fraction was 45% at that time. Myoview 2010 showed no ischemia and showed an ejection fraction of 40%. Echo January 2016 with LVEF=40%. Apical hypokinesis. No LV thrombus seen. Echo March 2021 with LVEF=35-40%. He has been on Entresto.     He is being seen today for the evaluation of VT and ICM at the request of Dr. Orene Desanctis.  HPI:  [name] is a 60 y.o. male with medical history as above.   Presented 09/20/2020 with chest pain.  He was awakened by chest pressure and palpitations. The pressure was 10/10 and reminded him of his prior MI in 2008. It was not associated with SOB, N/V, or diaphoresis.   He called EMS and was in a Wide-complex tachycardia and was given amio 150 IV en-route to the hospital.   He was unstable on arrival to the hospital with SBP in the 70s with chest pain and VT requiring urgent defibrillation at 100J which returned him to sinus rhythm.  His chest pain resolved with NSR. He was [facility] for [facility] which showed:  Prox RCA lesion is 80% stenosed. Previously placed Prox LAD to Mid LAD stent (unknown type) is widely patent. Dist Cx lesion is 100% stenosed. Prox Cx lesion is 30% stenosed. 1st Mrg lesion is 90% stenosed. 2nd Diag lesion is 90% stenosed.  He underwent successful stenting of a ulcerate proximal dominant RCA. High-grade OM1 was treated medically as had been previously described in 2008.  Currently, he is doing well. He denies chest pain, palpitations, dyspnea, PND,  orthopnea, nausea, vomiting, dizziness, syncope, edema, weight gain, or early satiety.  Past Medical History:  Diagnosis Date   Asthma    CAD (coronary artery disease)    a. CAD s/p anterior MI in 2008 treated with DES to the LAD.   Cataract 2018   both eyes   Cellulitis of leg, right 10/2014   COPD (chronic obstructive pulmonary disease) ([place])    Dental caries    Diabetes mellitus    TYPE 2   GERD (gastroesophageal reflux disease)    Hyperlipidemia    Hypertension 2017   Ischemic cardiomyopathy    Leukocytosis    noted on labs   Microcytic anemia    Myocardial infarction [facility]) 2008   Reactive airway disease    Thyroid nodule    biopsy negative 2006     Surgical History:  Past Surgical History:  Procedure Laterality Date   CORONARY STENT INTERVENTION N/A 09/20/2020   Procedure: CORONARY STENT INTERVENTION;  Surgeon: Lorretta Harp, MD;  Location: [place] CV LAB;  Service: Cardiovascular;  Laterality: N/A;   CORONARY STENT PLACEMENT     ESOPHAGOGASTRODUODENOSCOPY N/A 03/29/2017   Procedure: ESOPHAGOGASTRODUODENOSCOPY (EGD);  Surgeon: Milus Banister, MD;  Location: [facility] ENDOSCOPY;  Service: Endoscopy;  Laterality: N/A;   ESOPHAGOGASTRODUODENOSCOPY N/A 03/31/2018   Procedure: ESOPHAGOGASTRODUODENOSCOPY (EGD);  Surgeon: Doran Stabler, MD;  Location: [place];  Service: Gastroenterology;  Laterality: N/A;   FOREIGN BODY REMOVAL N/A 03/31/2018   Procedure: FOREIGN BODY REMOVAL;  Surgeon: Wilfrid Lund  L III, MD;  Location: [place];  Service: Gastroenterology;  Laterality: N/A;   LEFT HEART CATH AND CORONARY ANGIOGRAPHY N/A 09/20/2020   Procedure: LEFT HEART CATH AND CORONARY ANGIOGRAPHY;  Surgeon: Lorretta Harp, MD;  Location: [place] CV LAB;  Service: Cardiovascular;  Laterality: N/A;   solitary pulm and thryoid nodules       Medications Prior to Admission  Medication Sig Dispense Refill Last Dose   acetaminophen (TYLENOL) 500 MG tablet Take 2 tablets  (1,000 mg total) by mouth every 8 (eight) hours. 30 tablet 1 unk   ADVAIR HFA 230-21 MCG/ACT inhaler INHALE 2 PUFFS INTO THE LUNGS 2 TIMES A DAY. (Patient taking differently: Inhale 2 puffs into the lungs 2 (two) times daily.) 1 each 2 09/19/2020 at Unknown time   aspirin EC 81 MG tablet Take 81 mg by mouth daily.   09/20/2020 at Unknown time   atorvastatin (LIPITOR) 40 MG tablet TAKE 1 TABLET BY MOUTH AT BEDTIME (Patient taking differently: Take 40 mg by mouth at bedtime.) 60 tablet 0 09/19/2020 at Unknown time   calcium-vitamin D (OSCAL WITH D) 250-125 MG-UNIT tablet Take 1 tablet by mouth 2 (two) times daily.   09/19/2020 at Unknown time   FARXIGA 10 MG TABS tablet TAKE 1 TABLET (10MG TOTAL) BY MOUTH DAILY. (Patient taking differently: Take 10 mg by mouth daily.) 90 tablet 0 09/19/2020 at Unknown time   ferrous sulfate 325 (65 FE) MG tablet Take 325 mg by mouth daily with breakfast.   09/19/2020 at Unknown time   metFORMIN (GLUCOPHAGE) 1000 MG tablet Take 1 tablet (1,000 mg total) by mouth 2 (two) times daily with a meal. 180 tablet 1 09/19/2020 at Unknown time   Multiple Vitamin (MULTIVITAMIN WITH MINERALS) TABS tablet Take 1 tablet by mouth daily.   09/19/2020 at Unknown time   naproxen (NAPROSYN) 375 MG tablet Take 1 tablet (375 mg total) by mouth 2 (two) times daily with a meal. 30 tablet 1 unk   pantoprazole (PROTONIX) 20 MG tablet Take 1 tablet (20 mg total) by mouth daily. (Patient taking differently: Take 20 mg by mouth daily as needed for heartburn or indigestion.) 30 tablet 0 Past Week at Unknown time   predniSONE (DELTASONE) 10 MG tablet Take 1 tablet by mouth once daily with breakfast (Patient taking differently: Take 10 mg by mouth daily with breakfast.) 30 tablet 0 09/19/2020 at Unknown time   PROAIR HFA 108 (90 Base) MCG/ACT inhaler INHALE 2 PUFFS INTO THE LUNGS EVERY 4 HOURS AS NEEDED FOR WHEEZING OR SHORTNESS OF BREATH (PLAN B). (Patient taking differently: Inhale 2 puffs into the lungs every 4 (four)  hours as needed for wheezing or shortness of breath.) 8 each 5 unk   sacubitril-valsartan (ENTRESTO) 97-103 MG Take 1 tablet by mouth 2 (two) times daily. 180 tablet 1 09/19/2020 at Unknown time    Inpatient Medications:   aspirin  81 mg Oral Daily   atorvastatin  80 mg Oral QHS   calcium-vitamin D  0.5 tablet Oral BID   clopidogrel  75 mg Oral Q breakfast   enoxaparin (LOVENOX) injection  40 mg Subcutaneous Q24H   insulin aspart  0-15 Units Subcutaneous TID WC   insulin aspart  0-5 Units Subcutaneous QHS   mometasone-formoterol  2 puff Inhalation BID   multivitamin with minerals  1 tablet Oral Daily   pantoprazole  20 mg Oral Daily   predniSONE  10 mg Oral Q breakfast   sodium chloride flush  3 mL  Intravenous Q12H    Allergies: No Known Allergies  Social History   Socioeconomic History   Marital status: Married    Spouse name: Not on file   Number of children: 3   Years of education: Not on file   Highest education level: Not on file  Occupational History   Not on file  Tobacco Use   Smoking status: Former Smoker    Packs/day: 0.50    Years: 22.00    Pack years: 11.00    Types: Cigarettes    Quit date: 04/21/2006    Years since quitting: 14.4   Smokeless tobacco: Never Used   Tobacco comment: quit 10 years ago  Vaping Use   Vaping Use: Never used  Substance and Sexual Activity   Alcohol use: No    Alcohol/week: 0.0 standard drinks   Drug use: No   Sexual activity: Yes    Birth control/protection: Condom  Other Topics Concern   Not on file  Social History Narrative   Lives with wife; recently moved to [place] from [place] due to health problems. Has no means of affording meds currently + tobacco 1/2ppd x 50yr, no ETOH no drugs.   Social Determinants of Health   Financial Resource Strain: Not on file  Food Insecurity: Not on file  Transportation Needs: Not on file  Physical Activity: Not on file  Stress: Not on file  Social Connections: Not on file  Intimate Partner  Violence: Not on file     Family History  Problem Relation Age of Onset   Diabetes Mother    Heart attack Brother 373  Colon cancer Neg Hx    Esophageal cancer Neg Hx    Rectal cancer Neg Hx    Stomach cancer Neg Hx      Review of Systems: All other systems reviewed and are otherwise negative except as noted above.  Physical Exam: Vitals:   09/20/20 2325 09/21/20 0303 09/21/20 0545 09/21/20 0700  BP: 130/66 124/68  (!) 107/48  Pulse: 69 83    Resp: '18 19  16  ' Temp: 98.1 F (36.7 C) 98.8 F (37.1 C)  98.1 F (36.7 C)  TempSrc: Oral Oral  Oral  SpO2: 98% 97%    Weight:   66.1 kg   Height:        GEN- The patient is well appearing, alert and oriented x 3 today.   HEENT: normocephalic, atraumatic; sclera clear, conjunctiva pink; hearing intact; oropharynx clear; neck supple Lungs- Clear to ausculation bilaterally, normal work of breathing.  No wheezes, rales, rhonchi Heart- Regular rate and rhythm, no murmurs, rubs or gallops GI- soft, non-tender, non-distended, bowel sounds present Extremities- no clubbing, cyanosis, or edema; DP/PT/radial pulses 2+ bilaterally MS- no significant deformity or atrophy Skin- warm and dry, no rash or lesion Psych- euthymic mood, full affect Neuro- strength and sensation are intact  Labs:   Lab Results  Component Value Date   WBC 15.3 (H) 09/21/2020   HGB 13.0 09/21/2020   HCT 41.4 09/21/2020   MCV 75.7 (L) 09/21/2020   PLT 203 09/21/2020    Recent Labs  Lab 09/20/20 1034 09/20/20 1046 09/21/20 0300  NA 136   < > 137  K 4.9   < > 4.1  CL 107   < > 107  CO2 19*  --  23  BUN 13   < > 7*  CREATININE 1.07   < > 1.03  CALCIUM 8.9  --  8.9  PROT 6.8  --   --  BILITOT 1.1  --   --   ALKPHOS 39  --   --   ALT 19  --   --   AST 47*  --   --   GLUCOSE 191*   < > 112*   < > = values in this interval not displayed.      Radiology/Studies: CARDIAC CATHETERIZATION  Result Date: 09/20/2020  Prox RCA lesion is 80% stenosed.   Previously placed Prox LAD to Mid LAD stent (unknown type) is widely patent.  Dist Cx lesion is 100% stenosed.  Prox Cx lesion is 30% stenosed.  1st Mrg lesion is 90% stenosed.  2nd Diag lesion is 90% stenosed.  [name] is a 60 y.o. male  [id number] LOCATION:  FACILITY: [facility] PHYSICIAN: Quay Burow, M.D. May 29, 1960 DATE OF PROCEDURE:  09/20/2020 DATE OF DISCHARGE: CARDIAC CATHETERIZATION / PCI DES RCA History obtained from chart review.[name] is a 60 y.o. male with CAD, GERD, DM, asthma, hyperlipidemia. In 2008 he had an anterior MI treated with a drug eluting stent in the LAD. His ejection fraction was 45% at that time. Myoview 2010 showed no ischemia and showed an ejection fraction of 40%. Echo January 2016 with LVEF=40%. Apical hypokinesis. No LV thrombus seen. Echo March 2021 with LVEF=35-40%. He has been on Entresto.    He is being seen 09/20/2020 for the evaluation of VT and chest pain.  He underwent DC cardioversion to sinus rhythm.  He was placed on amiodarone.  His post cardioversion rhythm/EKG revealed Q waves across his precordium with persistent ST segment elevation consistent with LV apical thrombus.  He presents now for diagnostic coronary angiography to rule out an ischemic etiology.  There is intention to implant a ICD. PROCEDURE DESCRIPTION: The patient was brought to the second floor [place] Cardiac cath lab in the postabsorptive state. He was not premedicated . His right wrist was prepped and shaved in usual sterile fashion. Xylocaine 1% was used for local anesthesia. A 6 French sheath was inserted into the right radial  artery using standard Seldinger technique. The patient received 3500 units  of heparin intravenously.  5 [place] TIG catheter and pigtail catheters were used for selective coronary angiography and obtain left heart pressures.  Isovue dye is used for the entirety of the case.  Retrograde aortic, ventricular and pullback pressures were recorded.  Radial cocktail was  administered via the SideArm sheath. Patient received a total of 7500 units of heparin per ACT of 279.  He received 60 mg of p.o. Plavix followed by 20 mg of IV Pepcid.  Isovue dye was used for the entirety of the intervention.  Retrograde aortic pressures monitored during the case.  A total of 65 cc of contrast was administered the patient.  Radial cocktail was administered via the SideArm sheath. Using a 6 [place] JR4 guide catheter along with 0.14/190 cm long [place] guidewire and a 2 mm x 12 mm balloon in the proximal RCA was predilated.  Following this a 2.25 x 18 mm long Medtronic resolute Onyx drug-eluting stent was then carefully positioned and deployed at 14 atm in the proximal RCA across the disease segment.  It was postdilated with a 2.5 mm x 15 mm long noncompliant balloon at 16 atm (2.6 mm) resulting reduction of a 80 to 90% ulcerated proximal RCA stenosis to 0% residual.  Patient taught procedure well.  The guidewire and catheter were removed.  The sheath was removed and a TR band was placed on the right wrist to  achieve patent hemostasis.  The patient left lab in stable condition.   [name] presented with VT and chest pain.  His pain resolved with cardioversion.  His enzymes are negative initially.  He does have queues across his precordium with persistent ST segment elevation consistent with apical thrombus suggesting that his VT may be "scar mediated".  His cath revealed widely patent proximal LAD stent with TIMI II flow down the LAD.  He does have a high-grade first marginal branch stenosis at the ostium which was described in the cath report in 2008 and was treated medically at that time.  His distal circumflex involving small posterolateral branches is subtotally occluded as well.  I believe his "culprit lesion" is an ulcerated proximal dominant RCA which was successfully stented.  I reviewed the films with both with Drs. Croitoru, his attending physician and Dr. Lovena Le, the  electrophysiologist.  He will need implantation of an ICD for secondary prevention of sudden cardiac death given his presentation with ventricular tachycardia. Quay Burow. MD, [facility] 09/20/2020 3:24 PM   DG Chest Port 1 View  Result Date: 09/20/2020 CLINICAL DATA:  Chest pain. EXAM: PORTABLE CHEST 1 VIEW COMPARISON:  Aug 31, 2019. FINDINGS: The heart size and mediastinal contours are within normal limits. Both lungs are clear. The visualized skeletal structures are unremarkable. IMPRESSION: No active disease. Electronically Signed   By: Marijo Conception M.D.   On: 09/20/2020 10:54    EKG: on arrival showed ventricular tachycardia at 197 bpm (personally reviewed)  Post defib shows NSR at 75 bpm with lateral Q waves (personally reviewed)  TELEMETRY: NSR with one 13 beat run NSVT over night (personally reviewed)  Assessment/Plan: 1.  Ventricular tachycardia In setting of NSTEMI Updated echo pending. Underwent stenting of RCA Remains on amiodarone. Would stop for home.  Agree with adding BB.  By guidelines, patient does not meet criteria for immediate ICD with re-vascularization 6/2. If VT recurs >48 hours, will be candidate for ICD at that time.  If EF improved on Echo today, will eventually need EPS +/- ICD.  If EF remains down, would repeat Echo in 3 months post revascularization and continued titration of GDMT as tolerated. If EF remains down at that time would proceed with ICD.   2. CAD s/p DES Presented with CP and now s/p stent to RCA which was suspected target lesion Per primary.   3. Chronic systolic CHF Echo 12/5619 LVEF 35-40% - > update pending GDMT per primary team Has been on Entresto and Farxiga at home Has not been on BB due to reactive airway disease. Now on bisoprolol.  For questions or updates, please contact [place] Please consult [email or web address] for contact info under Cardiology/STEMI.  Jacalyn Lefevre, PA-C  09/21/2020 10:07 AM  EP  Attending  Patient seen and examined. I reviewed the situation with the patient and his son. He is a pleasant man with a long h/o CAD, s/p remote acute anterior MI. His EF is 35%. He presented with sustained VT and was found to have an ulcerative RCA lesion for which he underwent PCI of the RCA with stenting. He also has a large marginal branch not particularly amendable to PCI. He has been placed on amiodarone. His exam is as noted above. As he has undergone PCI of a culprit lesion, we will have him wear a life vest and repeat his 2D echo in 3 months post revascularization. If his EF is 35% or less then he will undergo ICD insertion.  If his EF is above 35%, then EP study will be recommended. In the mean time he will be maintained on guideline directed medical therapy. He is encouraged to wear the life vest at all t execpt which he showers.   Carleene Overlie Sharrod Achille,MD

## 2020-09-21 NOTE — Progress Notes (Signed)
CARDIAC REHAB PHASE I   PRE:  Rate/Rhythm: 79 SR    BP: sitting 132/71    SaO2: 94 RA  MODE:  Ambulation: 430 ft   POST:  Rate/Rhythm: 106 ST    BP: sitting 125/73    SaO2: 97 RA  Tolerated well, quick pace. Some SOB but pt did not complain. VSS. Discussed stent, Plavix, restrictions, diet, exercise, NTG and [place] CRPII. Will refer. Son present for education. [place], ACSM 09/21/2020 11:37 AM

## 2020-09-21 NOTE — Progress Notes (Addendum)
Progress Note  Patient Name: [name] Date of Encounter: 09/21/2020  [facility] Cardiologist: Lauree Chandler, MD   Subjective   Feeling well this morning.  Inpatient Medications    Scheduled Meds: . aspirin  81 mg Oral Daily  . atorvastatin  80 mg Oral QHS  . calcium-vitamin D  0.5 tablet Oral BID  . clopidogrel  75 mg Oral Q breakfast  . enoxaparin (LOVENOX) injection  40 mg Subcutaneous Q24H  . insulin aspart  0-15 Units Subcutaneous TID WC  . insulin aspart  0-5 Units Subcutaneous QHS  . mometasone-formoterol  2 puff Inhalation BID  . multivitamin with minerals  1 tablet Oral Daily  . pantoprazole  20 mg Oral Daily  . predniSONE  10 mg Oral Q breakfast  . sodium chloride flush  3 mL Intravenous Q12H   Continuous Infusions: . sodium chloride    . amiodarone 30 mg/hr (09/21/20 0151)   PRN Meds: sodium chloride, acetaminophen, albuterol, ALPRAZolam, morphine injection, nitroGLYCERIN, ondansetron (ZOFRAN) IV, sodium chloride flush, zolpidem   Vital Signs    Vitals:   09/20/20 2325 09/21/20 0303 09/21/20 0545 09/21/20 0700  BP: 130/66 124/68  (!) 107/48  Pulse: 69 83    Resp: _0 Temp: 98.1 F (36.7 C) 98.8 F (37.1 C)  98.1 F (36.7 C)  TempSrc: Oral Oral  Oral  SpO2: 98% 97%    Weight:   66.1 kg   Height:        Intake/Output Summary (Last 24 hours) at 09/21/2020 0949 Last data filed at 09/21/2020 0151 Gross per 24 hour  Intake 1064.8 ml  Output --  Net 1064.8 ml   Last 3 Weights 09/21/2020 09/20/2020 08/07/2020  Weight (lbs) 145 lb 11.2 oz 149 lb 1.6 oz 147 lb 6.4 oz  Weight (kg) 66.089 kg 67.631 kg 66.86 kg      Telemetry    SR, had 13 beat run of NSVT overnight, PVCs - Personally Reviewed  ECG    SR with q waves in inferior leads, continued STE in v3-v5 - Personally Reviewed  Physical Exam   GEN: No acute distress.   Neck: No JVD Cardiac: RRR, no murmurs, rubs, or gallops.  Respiratory: mild expiratory wheezing GI: Soft,  nontender, non-distended  MS: No edema; No deformity. Right radial cath site stable.  Neuro:  Nonfocal  Psych: Normal affect   Labs    High Sensitivity Troponin:   Recent Labs  Lab 09/20/20 1034 09/20/20 1255 09/20/20 1608 09/20/20 1758  TROPONINIHS 3 438* 1,615* 2,569*      Chemistry Recent Labs  Lab 09/20/20 1034 09/20/20 1046 09/21/20 0300  NA 136 139 137  K 4.9 4.7 4.1  CL 107 108 107  CO2 19*  --  23  GLUCOSE 191* 169* 112*  BUN 13 18 7*  CREATININE 1.07 1.00 1.03  CALCIUM 8.9  --  8.9  PROT 6.8  --   --   ALBUMIN 3.6  --   --   AST 47*  --   --   ALT 19  --   --   ALKPHOS 39  --   --   BILITOT 1.1  --   --   GFRNONAA >60  --  >60  ANIONGAP 10  --  7     Hematology Recent Labs  Lab 09/20/20 1034 09/20/20 1046 09/21/20 0300  WBC 14.6*  --  15.3*  RBC 6.18*  --  5.47  HGB 14.7 15.6 13.0  HCT 47.6 46.0 41.4  MCV 77.0*  --  75.7*  MCH 23.8*  --  23.8*  MCHC 30.9  --  31.4  RDW 15.3  --  15.2  PLT 269  --  203    BNPNo results for input(s): BNP, PROBNP in the last 168 hours.   DDimer No results for input(s): DDIMER in the last 168 hours.   Radiology    CARDIAC CATHETERIZATION  Result Date: 09/20/2020  Prox RCA lesion is 80% stenosed.  Previously placed Prox LAD to Mid LAD stent (unknown type) is widely patent.  Dist Cx lesion is 100% stenosed.  Prox Cx lesion is 30% stenosed.  1st Mrg lesion is 90% stenosed.  2nd Diag lesion is 90% stenosed.  [name] is a 60 y.o. male  [id number] LOCATION:  FACILITY: [facility] PHYSICIAN: [name], M.D. [birth year] DATE OF PROCEDURE:  09/20/2020 DATE OF DISCHARGE: CARDIAC CATHETERIZATION / PCI DES RCA History obtained from chart review.[name] is a 60 y.o. male with CAD, GERD, DM,asthma, hyperlipidemia. In 2008 he had an anterior MI treated with adrug eluting stent in the LAD. His ejection fraction was 45% at that time. Myoview 2010 showed no ischemia and showed an ejection fraction of 40%. Echo  January 2016 with LVEF=40%. Apical hypokinesis. No LV thrombus seen.Echo March 2021 with LVEF=35-40%. He has been on Entresto.   He is being seen 09/20/2020 for the evaluation of VT and chest pain.  He underwent DC cardioversion to sinus rhythm.  He was placed on amiodarone.  His post cardioversion rhythm/EKG revealed Q waves across his precordium with persistent ST segment elevation consistent with LV apical thrombus.  He presents now for diagnostic coronary angiography to rule out an ischemic etiology.  There is intention to implant a ICD. PROCEDURE DESCRIPTION: The patient was brought to the second floor [facility]iac cath lab in the postabsorptive state. He was not premedicated . His right wrist was prepped and shaved in usual sterile fashion. Xylocaine 1% was used for local anesthesia. A 6 French sheath was inserted into the right radial  artery using standard Seldinger technique. The patient received 3500 units  of heparin intravenously.  5 [place] TIG catheter and pigtail catheters were used for selective coronary angiography and obtain left heart pressures.  Isovue dye is used for the entirety of the case.  Retrograde aortic, ventricular and pullback pressures were recorded.  Radial cocktail was administered via the SideArm sheath. Patient received a total of 7500 units of heparin per ACT of 279.  He received 60 mg of p.o. Plavix followed by 20 mg of IV Pepcid.  Isovue dye was used for the entirety of the intervention.  Retrograde aortic pressures monitored during the case.  A total of 65 cc of contrast was administered the patient.  Radial cocktail was administered via the SideArm sheath. Using a 6 [place] JR4 guide catheter along with 0.14/190 cm long [place] guidewire and a 2 mm x 12 mm balloon in the proximal RCA was predilated.  Following this a 2.25 x 18 mm long Medtronic resolute Onyx drug-eluting stent was then carefully positioned and deployed at 14 atm in the proximal RCA across the disease  segment.  It was postdilated with a 2.5 mm x 15 mm long noncompliant balloon at 16 atm (2.6 mm) resulting reduction of a 80 to 90% ulcerated proximal RCA stenosis to 0% residual.  Patient taught procedure well.  The guidewire and catheter were removed.  The sheath was removed and a TR  band was placed on the right wrist to achieve patent hemostasis.  The patient left lab in stable condition.   Mr Stann presented with VT and chest pain.  His pain resolved with cardioversion.  His enzymes are negative initially.  He does have queues across his precordium with persistent ST segment elevation consistent with apical thrombus suggesting that his VT may be "scar mediated".  His cath revealed widely patent proximal LAD stent with TIMI II flow down the LAD.  He does have a high-grade first marginal branch stenosis at the ostium which was described in the cath report in 2008 and was treated medically at that time.  His distal circumflex involving small posterolateral branches is subtotally occluded as well.  I believe his "culprit lesion" is an ulcerated proximal dominant RCA which was successfully stented.  I reviewed the films with both with Drs. Croitoru, his attending physician and Dr. Lovena Le, the electrophysiologist.  He will need implantation of an ICD for secondary prevention of sudden cardiac death given his presentation with ventricular tachycardia. [name]. MD, [facility] 09/20/2020 3:24 PM   DG Chest Port 1 View  Result Date: 09/20/2020 CLINICAL DATA:  Chest pain. EXAM: PORTABLE CHEST 1 VIEW COMPARISON:  Aug 31, 2019. FINDINGS: The heart size and mediastinal contours are within normal limits. Both lungs are clear. The visualized skeletal structures are unremarkable. IMPRESSION: No active disease. Electronically Signed   By: Marijo Conception M.D.   On: 09/20/2020 10:54    Cardiac Studies   Cath: 09/20/20   Prox RCA lesion is 80% stenosed.  Previously placed Prox LAD to Mid LAD stent (unknown type) is  widely patent.  Dist Cx lesion is 100% stenosed.  Prox Cx lesion is 30% stenosed.  1st Mrg lesion is 90% stenosed.  2nd Diag lesion is 90% stenosed.  IMPRESSION: Mr Ruz presented with VT and chest pain.  His pain resolved with cardioversion.  His enzymes are negative initially.  He does have queues across his precordium with persistent ST segment elevation consistent with apical thrombus suggesting that his VT may be "scar mediated".  His cath revealed widely patent proximal LAD stent with TIMI II flow down the LAD.  He does have a high-grade first marginal branch stenosis at the ostium which was described in the cath report in 2008 and was treated medically at that time.  His distal circumflex involving small posterolateral branches is subtotally occluded as well.  I believe his "culprit lesion" is an ulcerated proximal dominant RCA which was successfully stented.  I reviewed the films with both with Drs. Croitoru, his attending physician and Dr. Lovena Le, the electrophysiologist.  He will need implantation of an ICD for secondary prevention of sudden cardiac death given his presentation with ventricular tachycardia.  [name]. MD, [facility] 09/20/2020  Diagnostic Dominance: Right     Patient Profile     60 y.o. male with CAD, GERD, DM,asthma, hyperlipidemia. In 2008 he had an anterior MI treated with adrug eluting stent in the LAD. His ejection fraction was 45% at that time. Myoview 2010 showed no ischemia and showed an ejection fraction of 40%. Echo January 2016 with LVEF=40%. Apical hypokinesis. No LV thrombus seen.Echo March 2021 with LVEF=35-40%. He has been on Entresto. Presented on 6/2 with VT and admitted for further management.   Assessment & Plan    1. VT: presented with chest pain/palpitations and found to be in [facility]. Given 121m amiodarone via EMS and defibrillated x1 in the ED with return of NSR.  --  K+ 4.1, Mag 1.8--> will suppl -- remains on IV amiodarone -- has not  been on BB in the past 2/2 asthma, will attempt low bisoprolol 2.40m daily -- EP consult today for ICD vs lifevest  2. CAD with abnormal EKG: was taken urgently to the cath lab, noted above with culprit lesion of ulcerated pRCA with PCI/DESx1. Placed on DAPT with ASA/plavix for at least one year. No further chest pain overnight -- on ASA, plavix, and statin  3. Chronic systolic HF: has a known EF of 35-40%. Has been on Entresto as an outpatient. No BB in the past 2/2 to asthma, as above will attempt bisoprolol. EDelene Lollhas been held in the setting of soft BPs. Consider addition of spiro at discharge if BP stable. -- repeat echo pending  4. HLD: statin increased to atorvastatin to 839mdaily  5. DM: PTA meds--> metformin and farxiga  -- resume farxiga  6. Asthma: continue inhalers, daily prednisone  For questions or updates, please contact [facility] consult [email or web address] for contact info under        Signed, LiReino BellisNP  09/21/2020, 9:49 AM    I have personally seen and examined this patient. I agree with the assessment and plan as outlined above.  Doing well this am. NSVT on tele. No chest pain.  Will plan to continue ASA and plavix.  Continue IV amiodarone today but will not plan oral amiodarone at home Consider cardioselective beta blocker EP to see today. Likely will need Lifevest.   ChLauree Chandler/06/2020 10:44 AM

## 2020-09-21 NOTE — Progress Notes (Signed)
[facility] Dr. Alveta Heimlich paged as follows "6E RM28, MR. Deshong, pt had a 13 beat run of VT. VSS, IV amiodarone infusing. Notifying" No new orders at this time, pt otherwise stable.

## 2020-09-21 NOTE — Care Management (Addendum)
09-21-20 1422 Submitted information to [employer] for [employer]. Case Manager will follow for additional needs.    09-21-20 1657 Case Manager received update from [place] will be delivered tomorrow morning.

## 2020-09-21 NOTE — Progress Notes (Signed)
    Order placed for lifevest per EP rec's. Rep contacted, informed possible DC tomorrow.   SignedReino Bellis, NP-C 09/21/2020, 1:30 PM Pager: [phone]

## 2020-09-22 DIAGNOSIS — I255 Ischemic cardiomyopathy: Secondary | ICD-10-CM

## 2020-09-22 DIAGNOSIS — I214 Non-ST elevation (NSTEMI) myocardial infarction: Secondary | ICD-10-CM

## 2020-09-22 LAB — BASIC METABOLIC PANEL
Anion gap: 9 (ref 5–15)
BUN: 9 mg/dL (ref 8–23)
CO2: 21 mmol/L — ABNORMAL LOW (ref 22–32)
Calcium: 8.7 mg/dL — ABNORMAL LOW (ref 8.9–10.3)
Chloride: 109 mmol/L (ref 98–111)
Creatinine, Ser: 1.2 mg/dL (ref 0.61–1.24)
GFR, Estimated: >60 mL/min (ref >60)
Glucose, Bld: 100 mg/dL — ABNORMAL HIGH (ref 70–99)
Potassium: 4 mmol/L (ref 3.5–5.1)
Sodium: 139 mmol/L (ref 135–145)

## 2020-09-22 LAB — MAGNESIUM: Magnesium: 2.1 mg/dL (ref 1.7–2.4)

## 2020-09-22 MED ORDER — NITROGLYCERIN 0.4 MG SL SUBL: 0.4000 mg | SUBLINGUAL_TABLET | SUBLINGUAL | 2 refills | Status: AC | PRN

## 2020-09-22 MED ORDER — ATORVASTATIN CALCIUM 80 MG PO TABS: 80.0000 mg | ORAL_TABLET | Freq: Every day | ORAL | 2 refills | Status: DC

## 2020-09-22 MED ORDER — CLOPIDOGREL BISULFATE 75 MG PO TABS: 75.0000 mg | ORAL_TABLET | Freq: Every day | ORAL | 11 refills | Status: DC

## 2020-09-22 MED ORDER — BISOPROLOL FUMARATE 5 MG PO TABS: 2.5000 mg | ORAL_TABLET | Freq: Every day | ORAL | 2 refills | Status: DC

## 2020-09-22 NOTE — Discharge Summary (Signed)
Discharge Summary    Patient ID: [name] MRN: [id number]; DOB: November 19, 1960  Admit date: 09/20/2020 Discharge date: 09/22/2020  PCP:  Danna Hefty, DO   [facility] Providers Cardiologist:  Lauree Chandler, MD   {  Discharge Diagnoses    Principal Problem:   VT (ventricular tachycardia) ([place]) Active Problems:   CAD (coronary artery disease)   Non-ST elevation (NSTEMI) myocardial infarction ([place])   Cardiomyopathy, ischemic   Type 2 diabetes mellitus with complication, without long-term current use of insulin ([place])   COPD with asthma ([place])   Primary hypertension   Mixed hyperlipidemia    Diagnostic Studies/Procedures    Left Cardiac Catheterization 09/20/2020:  Prox RCA lesion is 80% stenosed.  Previously placed Prox LAD to Mid LAD stent (unknown type) is widely patent.  Dist Cx lesion is 100% stenosed.  Prox Cx lesion is 30% stenosed.  1st Mrg lesion is 90% stenosed.  2nd Diag lesion is 90% stenosed.  Diagnostic: Dominance: Right   Impressions: [name] presented with VT and chest pain.  His pain resolved with cardioversion.  His enzymes are negative initially.  He does have queues across his precordium with persistent ST segment elevation consistent with apical thrombus suggesting that his VT may be "scar mediated".  His cath revealed widely patent proximal LAD stent with TIMI II flow down the LAD.  He does have a high-grade first marginal branch stenosis at the ostium which was described in the cath report in 2008 and was treated medically at that time.  His distal circumflex involving small posterolateral branches is subtotally occluded as well.  I believe his "culprit lesion" is an ulcerated proximal dominant RCA which was successfully stented.  I reviewed the films with both with Drs. Croitoru, his attending physician and Dr. Lovena Le, the electrophysiologist.  He will need implantation of an ICD for secondary prevention of sudden cardiac death given  his presentation with ventricular tachycardia.  _____________  Echocardiogram 09/21/2020: Impressions: 1. Left ventricular ejection fraction, by estimation, is 30 to 35%. The  left ventricle has moderately decreased function. The left ventricle  demonstrates regional wall motion abnormalities. The mid-to-apical  anteroseptal, mid-to-apical inferoseptal,  mid-to-apical anterior, apical inferior, and apical lateral LV segments.  Apex appears aneurysmal. No LV thrombus visualized. There is mild  asymmetric left ventricular hypertrophy of the basal-septal segment. Left  ventricular diastolic parameters are  consistent with Grade I diastolic dysfunction (impaired relaxation).  Elevated left atrial pressure.  2. Right ventricular systolic function is normal. The right ventricular  size is normal.  3. The mitral valve is normal in structure. Trivial mitral valve  regurgitation. No evidence of mitral stenosis.  4. The aortic valve is tricuspid. There is mild calcification of the  aortic valve. There is mild thickening of the aortic valve. Aortic valve  regurgitation is not visualized. Mild aortic valve sclerosis is present,  with no evidence of aortic valve  stenosis.  5. The inferior vena cava is normal in size with greater than 50%  respiratory variability, suggesting right atrial pressure of 3 mmHg.   Comparison(s): Compared to prior study on 06/2019, there is no significant  change. The LVEF appears similar based on side-by-side comparison at about  35% with akinesis in LAD territory and aneurysmal apex.     History of Present Illness    [name] is a 60 year old male with a history of CAD with anterior MI in 2008 s/p DES ot LAD, ischemic cardiomyopathy with EF of 35-40% on  Echo in 06/2019, hypertension, hyperlipidemia, diabetes mellitus, COPD/asthma, and GERD who was admitted on 09/20/2020 with VT.   Patient in his usual state of health with no cardiac symptoms until the  morning of presentation when he was awaked from sleep with chest pressure and palpitation. The pressure was 10/10 on the pain scale and reminded him of his MI in 2008. No associated shortness of breath, nausea, vomiting, or diaphoresis. He had palpitations and was little lightheaded. 911 was called and upon EMS arrival he was noted to be in a wide-complex tachycardia. He was given 116m of IV Amiodarone in route to the ED.   Upon arrival to the ED, he was hypotensive with systolic BP in the 727Pand was still having chest pressure. He was still in VT. Therefore, he was sedated and defibrillated at 100 J. Once back in sinus rhythm, chest pressure resolved. He was admitted for further evaluation.    Hospital Course     Consultants: EP  Ventricular Tachycardia  Patient presented with VT requiring successful defibrillation in the ED with return of normal sinus rhythm as stated above. Patient underwent urgent cardiac catheterization on 09/20/2020 and underwent successful PCI with DES to proximal RCA. He was started on IV Amiodarone. Magnesium was supplemented as needed but Potassium remained above goal of >4.0. Patient had not been on beta-blocker in the past due asthma. He was started on low dose Bisoprolol 2.569mdaily this admission and tolerated this well. Patient has continued to have ventricular couplets and short runs of non-sustained VT but no sustained VT. EP was consulted and recommend LifeVest at discharge. They will follow-up with patient in a couple of months and discuss ICD vs EPS +/- ICD. Continue Bisoprolol at discharge. He will not be discharged on any PO Amiodarone. Will be discharged with LifeVest. No driving for 6 months given patient presented in VT requiring defibrillation - discussed this recommendation with patient.  NSTEMI History of CAD  Chest pain resolved after restoration of normal sinus rhythm. Initial troponin negative but repeats elevated at 438 >> 1,615 >> 2,569 (however this  was after VT and defibrillation). He did undergo urgent cardiac catheterization which showed widely patent stent in proximal LAD, 80% stenosis of proximal RCA, 30% stenosis of proximal LCX and 100% stenosis of distal LCX, 90% stenosis of OM1, and 90% stenosis of 2nd Diag. Culprit lesion was felt to be the ulcerated proximal RCA lesion which was successfully stented with DES. He tolerated procedure well and had no recurrent chest pain. He was started on DAPT with Aspirin and Plavix. He was started on beta-blocker as above and statin was increased.   Ischemic Cardiomyopathy  Echo showed LVEF of 30-35% with akinesis in the LAD territory and aneurysmal apex. Volume status stable this admission and has not required diuresis. Home Entresto was stopped due to soft BP - can consider restarting at outpatient follow-up if BP stabilizes. Started on low dose Bisoprolol as stated above. Continue home [place]  Hypertension  History of hypertension but was hypotensive on arrival in the setting of VT.  Continue medications for CHF as above.   Hyperlipidemia  Lipid panel this admission: Total Cholesterol 130, Triglycerides 98, HDL 40, LDL 70. LDL goal <70 given CAD. Home Lipitor increased to 8067maily. Will need repeat lipid panel and LFTs in 6-8 weeks.   Type 2 Diabetes Mellitus  Hemoglobin A1c this admission is pending at this time. He was placed on sliding scale insulin during admission. Can restart home medications  at discharge. Metformin can be restarted tomorrow.   COPD/Asthma Continue home inhalers.  Patient seen and examined by Dr. Domenic Polite and determined to be stable for discharge. Will help arrange outpatient follow-up. Medications as below.    Did the patient have an acute coronary syndrome (MI, NSTEMI, STEMI, etc) this admission?:  Yes                               AHA/ACC Clinical Performance & Quality Measures: 1. Aspirin prescribed? - Yes 2. ADP Receptor Inhibitor (Plavix/Clopidogrel,  Brilinta/Ticagrelor or Effient/Prasugrel) prescribed (includes medically managed patients)? - Yes 3. Beta Blocker prescribed? - Yes 4. High Intensity Statin (Lipitor 40-5m or Crestor 20-443m prescribed? - Yes 5. EF assessed during THIS hospitalization? - Yes 6. For EF <40%, was ACEI/ARB prescribed? - No - Reason:  Soft BP. Can consider restarting Entresto as outpatient. 7. For EF <40%, Aldosterone Antagonist (Spironolactone or Eplerenone) prescribed? - No - Reason:  Soft BP. Can considering starting as outpatient. 8. Cardiac Rehab Phase II ordered (including medically managed patients)? - Yes    _____________  Discharge Vitals Blood pressure (!) 109/58, pulse (!) 58, temperature 98.2 F (36.8 C), temperature source Oral, resp. rate 18, height _0  (1.702 m), weight 65.6 kg, SpO2 96 %.  Filed Weights   09/20/20 1546 09/21/20 0545 09/22/20 0542  Weight: 67.6 kg 66.1 kg 65.6 kg    Labs & Radiologic Studies    CBC Recent Labs    09/20/20 1034 09/20/20 1046 09/21/20 0300  WBC 14.6*  --  15.3*  NEUTROABS 7.9*  --   --   HGB 14.7 15.6 13.0  HCT 47.6 46.0 41.4  MCV 77.0*  --  75.7*  PLT 269  --  20678 Basic Metabolic Panel Recent Labs    09/20/20 1034 09/20/20 1046 09/21/20 0300 09/22/20 0255  NA 136   < > 137 139  K 4.9   < > 4.1 4.0  CL 107   < > 107 109  CO2 19*  --  23 21*  GLUCOSE 191*   < > 112* 100*  BUN 13   < > 7* 9  CREATININE 1.07   < > 1.03 1.20  CALCIUM 8.9  --  8.9 8.7*  MG 1.8  --   --  2.1   < > = values in this interval not displayed.   Liver Function Tests Recent Labs    09/20/20 1034  AST 47*  ALT 19  ALKPHOS 39  BILITOT 1.1  PROT 6.8  ALBUMIN 3.6   No results for input(s): LIPASE, AMYLASE in the last 72 hours. High Sensitivity Troponin:   Recent Labs  Lab 09/20/20 1034 09/20/20 1255 09/20/20 1608 09/20/20 1758  TROPONINIHS 3 438* 1,615* 2,569*    BNP Invalid input(s): POCBNP D-Dimer No results for input(s): DDIMER in the last  72 hours. Hemoglobin A1C No results for input(s): HGBA1C in the last 72 hours. Fasting Lipid Panel Recent Labs    09/21/20 0300  CHOL 130  HDL 40*  LDLCALC 70  TRIG 98  CHOLHDL 3.3   Thyroid Function Tests No results for input(s): TSH, T4TOTAL, T3FREE, THYROIDAB in the last 72 hours.  Invalid input(s): FREET3 _____________  CARDIAC CATHETERIZATION  Result Date: 09/20/2020  Prox RCA lesion is 80% stenosed.  Previously placed Prox LAD to Mid LAD stent (unknown type) is widely patent.  Dist Cx lesion is 100% stenosed.  Prox  Cx lesion is 30% stenosed.  1st Mrg lesion is 90% stenosed.  2nd Diag lesion is 90% stenosed.  [name] is a 60 y.o. male  [id number] LOCATION:  FACILITY: [facility] PHYSICIAN: [name], M.D. 21-Oct-1960 DATE OF PROCEDURE:  09/20/2020 DATE OF DISCHARGE: CARDIAC CATHETERIZATION / PCI DES RCA History obtained from chart review.[name] is a 60 y.o. male with CAD, GERD, DM,asthma, hyperlipidemia. In 2008 he had an anterior MI treated with adrug eluting stent in the LAD. His ejection fraction was 45% at that time. Myoview 2010 showed no ischemia and showed an ejection fraction of 40%. Echo January 2016 with LVEF=40%. Apical hypokinesis. No LV thrombus seen.Echo March 2021 with LVEF=35-40%. He has been on Entresto.   He is being seen 09/20/2020 for the evaluation of VT and chest pain.  He underwent DC cardioversion to sinus rhythm.  He was placed on amiodarone.  His post cardioversion rhythm/EKG revealed Q waves across his precordium with persistent ST segment elevation consistent with LV apical thrombus.  He presents now for diagnostic coronary angiography to rule out an ischemic etiology.  There is intention to implant a ICD. PROCEDURE DESCRIPTION: The patient was brought to the second floor [facility]iac cath lab in the postabsorptive state. He was not premedicated . His right wrist was prepped and shaved in usual sterile fashion. Xylocaine 1% was used for local  anesthesia. A 6 French sheath was inserted into the right radial  artery using standard Seldinger technique. The patient received 3500 units  of heparin intravenously.  5 [place] TIG catheter and pigtail catheters were used for selective coronary angiography and obtain left heart pressures.  Isovue dye is used for the entirety of the case.  Retrograde aortic, ventricular and pullback pressures were recorded.  Radial cocktail was administered via the SideArm sheath. Patient received a total of 7500 units of heparin per ACT of 279.  He received 60 mg of p.o. Plavix followed by 20 mg of IV Pepcid.  Isovue dye was used for the entirety of the intervention.  Retrograde aortic pressures monitored during the case.  A total of 65 cc of contrast was administered the patient.  Radial cocktail was administered via the SideArm sheath. Using a 6 [place] JR4 guide catheter along with 0.14/190 cm long [place] guidewire and a 2 mm x 12 mm balloon in the proximal RCA was predilated.  Following this a 2.25 x 18 mm long Medtronic resolute Onyx drug-eluting stent was then carefully positioned and deployed at 14 atm in the proximal RCA across the disease segment.  It was postdilated with a 2.5 mm x 15 mm long noncompliant balloon at 16 atm (2.6 mm) resulting reduction of a 80 to 90% ulcerated proximal RCA stenosis to 0% residual.  Patient taught procedure well.  The guidewire and catheter were removed.  The sheath was removed and a TR band was placed on the right wrist to achieve patent hemostasis.  The patient left lab in stable condition.   Mr [name] presented with VT and chest pain.  His pain resolved with cardioversion.  His enzymes are negative initially.  He does have queues across his precordium with persistent ST segment elevation consistent with apical thrombus suggesting that his VT may be "scar mediated".  His cath revealed widely patent proximal LAD stent with TIMI II flow down the LAD.  He does have a high-grade first  marginal branch stenosis at the ostium which was described in the cath report in 2008 and was treated medically  at that time.  His distal circumflex involving small posterolateral branches is subtotally occluded as well.  I believe his "culprit lesion" is an ulcerated proximal dominant RCA which was successfully stented.  I reviewed the films with both with Drs. Croitoru, his attending physician and Dr. Lovena Le, the electrophysiologist.  He will need implantation of an ICD for secondary prevention of sudden cardiac death given his presentation with ventricular tachycardia. [name]. MD, [facility] 09/20/2020 3:24 PM   DG Chest Port 1 View  Result Date: 09/20/2020 CLINICAL DATA:  Chest pain. EXAM: PORTABLE CHEST 1 VIEW COMPARISON:  Aug 31, 2019. FINDINGS: The heart size and mediastinal contours are within normal limits. Both lungs are clear. The visualized skeletal structures are unremarkable. IMPRESSION: No active disease. Electronically Signed   By: Marijo Conception M.D.   On: 09/20/2020 10:54   ECHOCARDIOGRAM COMPLETE  Result Date: 09/21/2020    ECHOCARDIOGRAM REPORT   Patient Name:   [name] Date of Exam: 09/21/2020 Medical Rec #:  [id number]      Height:       67.0 in Accession #:    [id number]     Weight:       145.7 lb Date of Birth:  07-23-1960      BSA:          1.768 m Patient Age:    26 years       BP:           124/68 mmHg Patient Gender: M              HR:           83 bpm. Exam Location:  Inpatient Procedure: 2D Echo, Cardiac Doppler and Color Doppler Indications:    Ventricular tachycardia  History:        Patient has prior history of Echocardiogram examinations, most                 recent 07/02/2019. Cardiomyopathy, CAD and Previous Myocardial                 Infarction, COPD; Risk Factors:Diabetes and Hypertension.  Sonographer:    Cammy Brochure Referring Phys: [id number] RHONDA G BARRETT IMPRESSIONS  1. Left ventricular ejection fraction, by estimation, is 30 to 35%. The left ventricle has  moderately decreased function. The left ventricle demonstrates regional wall motion abnormalities. The mid-to-apical anteroseptal, mid-to-apical inferoseptal, mid-to-apical anterior, apical inferior, and apical lateral LV segments. Apex appears aneurysmal. No LV thrombus visualized. There is mild asymmetric left ventricular hypertrophy of the basal-septal segment. Left ventricular diastolic parameters are consistent with Grade I diastolic dysfunction (impaired relaxation). Elevated left atrial pressure.  2. Right ventricular systolic function is normal. The right ventricular size is normal.  3. The mitral valve is normal in structure. Trivial mitral valve regurgitation. No evidence of mitral stenosis.  4. The aortic valve is tricuspid. There is mild calcification of the aortic valve. There is mild thickening of the aortic valve. Aortic valve regurgitation is not visualized. Mild aortic valve sclerosis is present, with no evidence of aortic valve stenosis.  5. The inferior vena cava is normal in size with greater than 50% respiratory variability, suggesting right atrial pressure of 3 mmHg. Comparison(s): Compared to prior study on 06/2019, there is no significant change. The LVEF appears similar based on side-by-side comparison at about 35% with akinesis in LAD territory and aneurysmal apex. FINDINGS  Left Ventricle: Left ventricular ejection fraction, by estimation, is 30 to 35%. The left  ventricle has moderately decreased function. The left ventricle demonstrates regional wall motion abnormalities. The mid-to-apical anteroseptal, mid-to-apical inferoseptal, mid-to-apical anterior, apical inferior, and apical lateral LV segments. Apex appears aneurysmal. No LV thrombus visualized. The left ventricular internal cavity size was normal in size. There is mild asymmetric left ventricular hypertrophy  of the basal-septal segment. Left ventricular diastolic parameters are consistent with Grade I diastolic dysfunction  (impaired relaxation). Elevated left atrial pressure. Right Ventricle: The right ventricular size is normal. No increase in right ventricular wall thickness. Right ventricular systolic function is normal. Left Atrium: Left atrial size was normal in size. Right Atrium: Right atrial size was normal in size. Pericardium: There is no evidence of pericardial effusion. Mitral Valve: The mitral valve is normal in structure. There is mild thickening of the mitral valve leaflet(s). Trivial mitral valve regurgitation. No evidence of mitral valve stenosis. Tricuspid Valve: The tricuspid valve is normal in structure. Tricuspid valve regurgitation is trivial. Aortic Valve: The aortic valve is tricuspid. There is mild calcification of the aortic valve. There is mild thickening of the aortic valve. Aortic valve regurgitation is not visualized. Mild aortic valve sclerosis is present, with no evidence of aortic valve stenosis. Aortic valve mean gradient measures 3.0 mmHg. Aortic valve peak gradient measures 6.2 mmHg. Aortic valve area, by VTI measures 2.35 cm. Pulmonic Valve: The pulmonic valve was normal in structure. Pulmonic valve regurgitation is trivial. Aorta: The aortic root and ascending aorta are structurally normal, with no evidence of dilitation. Venous: The inferior vena cava is normal in size with greater than 50% respiratory variability, suggesting right atrial pressure of 3 mmHg. IAS/Shunts: No atrial level shunt detected by color flow Doppler.  LEFT VENTRICLE PLAX 2D LVIDd:         4.80 cm  Diastology LVIDs:         4.00 cm  LV e' medial:    5.00 cm/s LV PW:         1.00 cm  LV E/e' medial:  18.4 LV IVS:        1.40 cm  LV e' lateral:   6.85 cm/s LVOT diam:     2.10 cm  LV E/e' lateral: 13.4 LV SV:         54 LV SV Index:   31 LVOT Area:     3.46 cm  RIGHT VENTRICLE             IVC RV Basal diam:  3.00 cm     IVC diam: 1.10 cm RV S prime:     12.70 cm/s LEFT ATRIUM             Index       RIGHT ATRIUM            Index LA diam:        3.50 cm 1.98 cm/m  RA Area:     12.10 cm LA Vol (A2C):   34.6 ml 19.58 ml/m RA Volume:   31.30 ml  17.71 ml/m LA Vol (A4C):   30.5 ml 17.26 ml/m LA Biplane Vol: 33.7 ml 19.07 ml/m  AORTIC VALVE AV Area (Vmax):    2.28 cm AV Area (Vmean):   2.22 cm AV Area (VTI):     2.35 cm AV Vmax:           125.00 cm/s AV Vmean:          85.500 cm/s AV VTI:            0.230 m  AV Peak Grad:      6.2 mmHg AV Mean Grad:      3.0 mmHg LVOT Vmax:         82.30 cm/s LVOT Vmean:        54.800 cm/s LVOT VTI:          0.156 m LVOT/AV VTI ratio: 0.68  AORTA Ao Root diam: 3.40 cm Ao Asc diam:  3.00 cm MITRAL VALVE MV Area (PHT): 3.85 cm     SHUNTS MV Decel Time: 197 msec     Systemic VTI:  0.16 m MV E velocity: 91.80 cm/s   Systemic Diam: 2.10 cm MV A velocity: 109.00 cm/s MV E/A ratio:  0.84 Gwyndolyn Kaufman MD Electronically signed by Gwyndolyn Kaufman MD Signature Date/Time: 09/21/2020/12:04:03 PM    Final    Disposition   Patient is being discharged home today in good condition.  Follow-up Plans & Appointments     Follow-up Information    Burnell Blanks, MD Follow up.   Specialty: Cardiology Why: Our office will call you to arrange schedule a follow-up visit within 7-10 days. If you do not hear from [place] on Monday, please give our office a call on Tuesday 09/25/2020. If you need help with transportation to this visit, please let us know as well. Contact information: [address] [place] [address] [phone]              Discharge Instructions    Amb Referral to Cardiac Rehabilitation   Complete by: As directed    Diagnosis:  Coronary Stents PTCA     After initial evaluation and assessments completed: Virtual Based Care may be provided alone or in conjunction with Phase 2 Cardiac Rehab based on patient barriers.: Yes   Diet - low sodium heart healthy   Complete by: As directed    Increase activity slowly   Complete by: As directed        Discharge Medications   Allergies as of 09/22/2020   No Known Allergies     Medication List    STOP taking these medications   Entresto 97-103 MG Generic drug: sacubitril-valsartan   naproxen 375 MG tablet Commonly known as: NAPROSYN     TAKE these medications   acetaminophen 500 MG tablet Commonly known as: TYLENOL Take 2 tablets (1,000 mg total) by mouth every 8 (eight) hours.   Advair HFA 230-21 MCG/ACT inhaler Generic drug: fluticasone-salmeterol INHALE 2 PUFFS INTO THE LUNGS 2 TIMES A DAY. What changed: See the new instructions.   aspirin EC 81 MG tablet Take 81 mg by mouth daily.   atorvastatin 80 MG tablet Commonly known as: LIPITOR Take 1 tablet (80 mg total) by mouth at bedtime. What changed:   medication strength  how much to take   bisoprolol 5 MG tablet Commonly known as: ZEBETA Take 0.5 tablets (2.5 mg total) by mouth daily.   calcium-vitamin D 250-125 MG-UNIT tablet Commonly known as: OSCAL WITH D Take 1 tablet by mouth 2 (two) times daily.   clopidogrel 75 MG tablet Commonly known as: PLAVIX Take 1 tablet (75 mg total) by mouth daily with breakfast.   Farxiga 10 MG Tabs tablet Generic drug: dapagliflozin propanediol TAKE 1 TABLET (10MG TOTAL) BY MOUTH DAILY. What changed: See the new instructions.   ferrous sulfate 325 (65 FE) MG tablet Take 325 mg by mouth daily with breakfast.   metFORMIN 1000 MG tablet Commonly known as: GLUCOPHAGE Take 1 tablet (1,000 mg total) by  mouth 2 (two) times daily with a meal.   multivitamin with minerals Tabs tablet Take 1 tablet by mouth daily.   nitroGLYCERIN 0.4 MG SL tablet Commonly known as: NITROSTAT Place 1 tablet (0.4 mg total) under the tongue every 5 (five) minutes x 3 doses as needed for chest pain.   pantoprazole 20 MG tablet Commonly known as: PROTONIX Take 1 tablet (20 mg total) by mouth daily. What changed:   when to take this  reasons to take this   predniSONE 10 MG  tablet Commonly known as: DELTASONE Take 1 tablet by mouth once daily with breakfast   ProAir HFA 108 (90 Base) MCG/ACT inhaler Generic drug: albuterol INHALE 2 PUFFS INTO THE LUNGS EVERY 4 HOURS AS NEEDED FOR WHEEZING OR SHORTNESS OF BREATH (PLAN B). What changed: See the new instructions.            Durable Medical Equipment  (From admission, onward)         Start     Ordered   09/21/20 1328  For home use only DME Vest life vest  Once       Comments: Start Date: 09/21/20   Length of need: 3 months   09/21/20 1328             Outstanding Labs/Studies   Repeat lipid panel and LFTs in 6-8 weeks after increasing statin.  Duration of Discharge Encounter   Greater than 30 minutes including physician time.  Signed, Darreld Mclean, PA-C 09/22/2020, 8:52 AM

## 2020-09-22 NOTE — Plan of Care (Signed)
  Problem: Education: Goal: Knowledge of General Education information will improve Description: Including pain rating scale, medication(s)/side effects and non-pharmacologic comfort measures Outcome: Completed/Met   Problem: Health Behavior/Discharge Planning: Goal: Ability to manage health-related needs will improve Outcome: Completed/Met   Problem: Education: Goal: Ability to manage disease process will improve Outcome: Completed/Met Goal: Individualized Educational Video(s) Outcome: Completed/Met   Problem: Cardiac: Goal: Ability to achieve and maintain adequate cardiopulmonary perfusion will improve Outcome: Completed/Met   Problem: Education: Goal: Understanding of CV disease, CV risk reduction, and recovery process will improve Outcome: Completed/Met Goal: Individualized Educational Video(s) Outcome: Completed/Met   Problem: Activity: Goal: Ability to return to baseline activity level will improve Outcome: Completed/Met   Problem: Cardiovascular: Goal: Ability to achieve and maintain adequate cardiovascular perfusion will improve Outcome: Completed/Met Goal: Vascular access site(s) Level 0-1 will be maintained Outcome: Completed/Met   Problem: Health Behavior/Discharge Planning: Goal: Ability to safely manage health-related needs after discharge will improve Outcome: Completed/Met   Discharge instructions reviewed with patient and his family.  This included, but was not limited to, the following:  discharge medications, follow-up appointments, when to call the MD, medication instructions, including new medications and rational for use, Life Vest instructions (as reviewed by Estill Bamberg from the company), when to call the MD, etc.  Comprehension of instructions ascertained via "teach-back" technique.  Patient discharged to home with wife and son.  Escorted to exit via wheelchair by nurse tech.

## 2020-09-22 NOTE — Progress Notes (Signed)
Progress Note  Patient Name: Javarian Jakubiak Date of Encounter: 09/22/2020  Primary Cardiologist: Lauree Chandler, MD  Subjective   No chest pain or shortness of breath.  No palpitations.  Inpatient Medications    Scheduled Meds: . aspirin  81 mg Oral Daily  . atorvastatin  80 mg Oral QHS  . bisoprolol  2.5 mg Oral Daily  . calcium-vitamin D  0.5 tablet Oral BID  . clopidogrel  75 mg Oral Q breakfast  . dapagliflozin propanediol  10 mg Oral Daily  . enoxaparin (LOVENOX) injection  40 mg Subcutaneous Q24H  . insulin aspart  0-15 Units Subcutaneous TID WC  . insulin aspart  0-5 Units Subcutaneous QHS  . mometasone-formoterol  2 puff Inhalation BID  . multivitamin with minerals  1 tablet Oral Daily  . pantoprazole  20 mg Oral Daily  . predniSONE  10 mg Oral Q breakfast  . sodium chloride flush  3 mL Intravenous Q12H   Continuous Infusions: . sodium chloride    . amiodarone 30 mg/hr (09/22/20 0244)   PRN Meds: sodium chloride, acetaminophen, albuterol, ALPRAZolam, morphine injection, nitroGLYCERIN, ondansetron (ZOFRAN) IV, sodium chloride flush, zolpidem   Vital Signs    Vitals:   09/21/20 2022 09/21/20 2330 09/22/20 0538 09/22/20 0542  BP: (!) 100/55 (!) 98/59 (!) 109/58   Pulse: (!) 58 (!) 57 (!) 58   Resp: _0 Temp: 98.2 F (36.8 C) 98 F (36.7 C) 98.2 F (36.8 C)   TempSrc: Oral Oral Oral   SpO2: 97% 97% 96%   Weight:    65.6 kg  Height:        Intake/Output Summary (Last 24 hours) at 09/22/2020 0801 Last data filed at 09/21/2020 2359 Gross per 24 hour  Intake 355.2 ml  Output --  Net 355.2 ml   Filed Weights   09/20/20 1546 09/21/20 0545 09/22/20 0542  Weight: 67.6 kg 66.1 kg 65.6 kg    Telemetry    Sinus rhythm.  Personally reviewed.  ECG    An ECG dated 09/20/2020 was personally reviewed today and demonstrated:  Sinus rhythm with poor R wave progression, left anterior fascicular block, diffuse nonspecific ST-T changes.  Physical Exam    GEN: No acute distress.   Neck: No JVD. Cardiac: RRR, no murmur, rub, or gallop.  Respiratory: Nonlabored. Clear to auscultation bilaterally. GI: Soft, nontender, bowel sounds present. MS: No edema; No deformity. Neuro:  Nonfocal. Psych: Alert and oriented x 3. Normal affect.  Labs    Chemistry Recent Labs  Lab 09/20/20 1034 09/20/20 1046 09/21/20 0300 09/22/20 0255  NA 136 139 137 139  K 4.9 4.7 4.1 4.0  CL 107 108 107 109  CO2 19*  --  23 21*  GLUCOSE 191* 169* 112* 100*  BUN 13 18 7* 9  CREATININE 1.07 1.00 1.03 1.20  CALCIUM 8.9  --  8.9 8.7*  PROT 6.8  --   --   --   ALBUMIN 3.6  --   --   --   AST 47*  --   --   --   ALT 19  --   --   --   ALKPHOS 39  --   --   --   BILITOT 1.1  --   --   --   GFRNONAA >60  --  >60 >60  ANIONGAP 10  --  7 9     Hematology Recent Labs  Lab 09/20/20 1034 09/20/20 1046 09/21/20 0300  WBC 14.6*  --  15.3*  RBC 6.18*  --  5.47  HGB 14.7 15.6 13.0  HCT 47.6 46.0 41.4  MCV 77.0*  --  75.7*  MCH 23.8*  --  23.8*  MCHC 30.9  --  31.4  RDW 15.3  --  15.2  PLT 269  --  203    Cardiac Enzymes Recent Labs  Lab 09/20/20 1034 09/20/20 1255 09/20/20 1608 09/20/20 1758  TROPONINIHS 3 438* 1,615* 2,569*    Radiology    CARDIAC CATHETERIZATION  Result Date: 09/20/2020  Prox RCA lesion is 80% stenosed.  Previously placed Prox LAD to Mid LAD stent (unknown type) is widely patent.  Dist Cx lesion is 100% stenosed.  Prox Cx lesion is 30% stenosed.  1st Mrg lesion is 90% stenosed.  2nd Diag lesion is 90% stenosed.  Adriell Polansky is a 60 y.o. male  [id number] LOCATION:  FACILITY: [facility] PHYSICIAN: Quay Burow, M.D. 1960/07/26 DATE OF PROCEDURE:  09/20/2020 DATE OF DISCHARGE: CARDIAC CATHETERIZATION / PCI DES RCA History obtained from chart review.Bryndan Bilyk is a 60 y.o. male with CAD, GERD, DM,asthma, hyperlipidemia. In 2008 he had an anterior MI treated with adrug eluting stent in the LAD. His ejection fraction was 45% at  that time. Myoview 2010 showed no ischemia and showed an ejection fraction of 40%. Echo January 2016 with LVEF=40%. Apical hypokinesis. No LV thrombus seen.Echo March 2021 with LVEF=35-40%. He has been on Entresto.   He is being seen 09/20/2020 for the evaluation of VT and chest pain.  He underwent DC cardioversion to sinus rhythm.  He was placed on amiodarone.  His post cardioversion rhythm/EKG revealed Q waves across his precordium with persistent ST segment elevation consistent with LV apical thrombus.  He presents now for diagnostic coronary angiography to rule out an ischemic etiology.  There is intention to implant a ICD. PROCEDURE DESCRIPTION: The patient was brought to the second floor [facility] lab in the postabsorptive state. He was not premedicated . His right wrist was prepped and shaved in usual sterile fashion. Xylocaine 1% was used for local anesthesia. A 6 French sheath was inserted into the right radial  artery using standard Seldinger technique. The patient received 3500 units  of heparin intravenously.  5 [place] TIG catheter and pigtail catheters were used for selective coronary angiography and obtain left heart pressures.  Isovue dye is used for the entirety of the case.  Retrograde aortic, ventricular and pullback pressures were recorded.  Radial cocktail was administered via the SideArm sheath. Patient received a total of 7500 units of heparin per ACT of 279.  He received 60 mg of p.o. Plavix followed by 20 mg of IV Pepcid.  Isovue dye was used for the entirety of the intervention.  Retrograde aortic pressures monitored during the case.  A total of 65 cc of contrast was administered the patient.  Radial cocktail was administered via the SideArm sheath. Using a 6 [place] JR4 guide catheter along with 0.14/190 cm long [place] guidewire and a 2 mm x 12 mm balloon in the proximal RCA was predilated.  Following this a 2.25 x 18 mm long Medtronic resolute Onyx drug-eluting stent was  then carefully positioned and deployed at 14 atm in the proximal RCA across the disease segment.  It was postdilated with a 2.5 mm x 15 mm long noncompliant balloon at 16 atm (2.6 mm) resulting reduction of a 80 to 90% ulcerated proximal RCA stenosis to 0% residual.  Patient taught procedure  well.  The guidewire and catheter were removed.  The sheath was removed and a TR band was placed on the right wrist to achieve patent hemostasis.  The patient left lab in stable condition.   Mr Kichline presented with VT and chest pain.  His pain resolved with cardioversion.  His enzymes are negative initially.  He does have queues across his precordium with persistent ST segment elevation consistent with apical thrombus suggesting that his VT may be "scar mediated".  His cath revealed widely patent proximal LAD stent with TIMI II flow down the LAD.  He does have a high-grade first marginal branch stenosis at the ostium which was described in the cath report in 2008 and was treated medically at that time.  His distal circumflex involving small posterolateral branches is subtotally occluded as well.  I believe his "culprit lesion" is an ulcerated proximal dominant RCA which was successfully stented.  I reviewed the films with both with Drs. Croitoru, his attending physician and Dr. Lovena Le, the electrophysiologist.  He will need implantation of an ICD for secondary prevention of sudden cardiac death given his presentation with ventricular tachycardia. Quay Burow. MD, [facility] 09/20/2020 3:24 PM   DG Chest Port 1 View  Result Date: 09/20/2020 CLINICAL DATA:  Chest pain. EXAM: PORTABLE CHEST 1 VIEW COMPARISON:  Aug 31, 2019. FINDINGS: The heart size and mediastinal contours are within normal limits. Both lungs are clear. The visualized skeletal structures are unremarkable. IMPRESSION: No active disease. Electronically Signed   By: Marijo Conception M.D.   On: 09/20/2020 10:54   ECHOCARDIOGRAM COMPLETE  Result Date: 09/21/2020     ECHOCARDIOGRAM REPORT   Patient Name:   [name] Date of Exam: 09/21/2020 Medical Rec #:  [id number]      Height:       67.0 in Accession #:    [id number]     Weight:       145.7 lb Date of Birth:  [birth year]      BSA:          1.768 m Patient Age:    60 years       BP:           124/68 mmHg Patient Gender: M              HR:           83 bpm. Exam Location:  Inpatient Procedure: 2D Echo, Cardiac Doppler and Color Doppler Indications:    Ventricular tachycardia  History:        Patient has prior history of Echocardiogram examinations, most                 recent 07/02/2019. Cardiomyopathy, CAD and Previous Myocardial                 Infarction, COPD; Risk Factors:Diabetes and Hypertension.  Sonographer:    Cammy Brochure Referring Phys: [id number] RHONDA G BARRETT IMPRESSIONS  1. Left ventricular ejection fraction, by estimation, is 30 to 35%. The left ventricle has moderately decreased function. The left ventricle demonstrates regional wall motion abnormalities. The mid-to-apical anteroseptal, mid-to-apical inferoseptal, mid-to-apical anterior, apical inferior, and apical lateral LV segments. Apex appears aneurysmal. No LV thrombus visualized. There is mild asymmetric left ventricular hypertrophy of the basal-septal segment. Left ventricular diastolic parameters are consistent with Grade I diastolic dysfunction (impaired relaxation). Elevated left atrial pressure.  2. Right ventricular systolic function is normal. The right ventricular size is normal.  3. The mitral valve is  normal in structure. Trivial mitral valve regurgitation. No evidence of mitral stenosis.  4. The aortic valve is tricuspid. There is mild calcification of the aortic valve. There is mild thickening of the aortic valve. Aortic valve regurgitation is not visualized. Mild aortic valve sclerosis is present, with no evidence of aortic valve stenosis.  5. The inferior vena cava is normal in size with greater than 50% respiratory variability,  suggesting right atrial pressure of 3 mmHg. Comparison(s): Compared to prior study on 06/2019, there is no significant change. The LVEF appears similar based on side-by-side comparison at about 35% with akinesis in LAD territory and aneurysmal apex. FINDINGS  Left Ventricle: Left ventricular ejection fraction, by estimation, is 30 to 35%. The left ventricle has moderately decreased function. The left ventricle demonstrates regional wall motion abnormalities. The mid-to-apical anteroseptal, mid-to-apical inferoseptal, mid-to-apical anterior, apical inferior, and apical lateral LV segments. Apex appears aneurysmal. No LV thrombus visualized. The left ventricular internal cavity size was normal in size. There is mild asymmetric left ventricular hypertrophy  of the basal-septal segment. Left ventricular diastolic parameters are consistent with Grade I diastolic dysfunction (impaired relaxation). Elevated left atrial pressure. Right Ventricle: The right ventricular size is normal. No increase in right ventricular wall thickness. Right ventricular systolic function is normal. Left Atrium: Left atrial size was normal in size. Right Atrium: Right atrial size was normal in size. Pericardium: There is no evidence of pericardial effusion. Mitral Valve: The mitral valve is normal in structure. There is mild thickening of the mitral valve leaflet(s). Trivial mitral valve regurgitation. No evidence of mitral valve stenosis. Tricuspid Valve: The tricuspid valve is normal in structure. Tricuspid valve regurgitation is trivial. Aortic Valve: The aortic valve is tricuspid. There is mild calcification of the aortic valve. There is mild thickening of the aortic valve. Aortic valve regurgitation is not visualized. Mild aortic valve sclerosis is present, with no evidence of aortic valve stenosis. Aortic valve mean gradient measures 3.0 mmHg. Aortic valve peak gradient measures 6.2 mmHg. Aortic valve area, by VTI measures 2.35 cm.  Pulmonic Valve: The pulmonic valve was normal in structure. Pulmonic valve regurgitation is trivial. Aorta: The aortic root and ascending aorta are structurally normal, with no evidence of dilitation. Venous: The inferior vena cava is normal in size with greater than 50% respiratory variability, suggesting right atrial pressure of 3 mmHg. IAS/Shunts: No atrial level shunt detected by color flow Doppler.  LEFT VENTRICLE PLAX 2D LVIDd:         4.80 cm  Diastology LVIDs:         4.00 cm  LV e' medial:    5.00 cm/s LV PW:         1.00 cm  LV E/e' medial:  18.4 LV IVS:        1.40 cm  LV e' lateral:   6.85 cm/s LVOT diam:     2.10 cm  LV E/e' lateral: 13.4 LV SV:         54 LV SV Index:   31 LVOT Area:     3.46 cm  RIGHT VENTRICLE             IVC RV Basal diam:  3.00 cm     IVC diam: 1.10 cm RV S prime:     12.70 cm/s LEFT ATRIUM             Index       RIGHT ATRIUM           Index LA  diam:        3.50 cm 1.98 cm/m  RA Area:     12.10 cm LA Vol (A2C):   34.6 ml 19.58 ml/m RA Volume:   31.30 ml  17.71 ml/m LA Vol (A4C):   30.5 ml 17.26 ml/m LA Biplane Vol: 33.7 ml 19.07 ml/m  AORTIC VALVE AV Area (Vmax):    2.28 cm AV Area (Vmean):   2.22 cm AV Area (VTI):     2.35 cm AV Vmax:           125.00 cm/s AV Vmean:          85.500 cm/s AV VTI:            0.230 m AV Peak Grad:      6.2 mmHg AV Mean Grad:      3.0 mmHg LVOT Vmax:         82.30 cm/s LVOT Vmean:        54.800 cm/s LVOT VTI:          0.156 m LVOT/AV VTI ratio: 0.68  AORTA Ao Root diam: 3.40 cm Ao Asc diam:  3.00 cm MITRAL VALVE MV Area (PHT): 3.85 cm     SHUNTS MV Decel Time: 197 msec     Systemic VTI:  0.16 m MV E velocity: 91.80 cm/s   Systemic Diam: 2.10 cm MV A velocity: 109.00 cm/s MV E/A ratio:  0.84 Gwyndolyn Kaufman MD Electronically signed by Gwyndolyn Kaufman MD Signature Date/Time: 09/21/2020/12:04:03 PM    Final     Patient Profile     60 y.o. male with a history of type 2 diabetes mellitus, asthma, hyperlipidemia, and CAD status post  anterior infarct in 2008 managed with DES to the LAD.  Also known cardiomyopathy.  He presented on June 2 with chest pain and sustained VT requiring cardioversion.  Urgent cardiac catheterization demonstrated ulcerated proximal RCA stenosis managed with DES.  He has residual cardiomyopathy with LVEF in the range of 30 to 35% at this point.  Assessment & Plan    1.  NSTEMI, peak high-sensitivity troponin I 2569.  Patient presented with sustained VT requiring cardioversion, underwent cardiac catheterization demonstrating an ulcerated proximal RCA stenosis that was culprit lesion managed with DES.  Proximal to mid LAD stent site patent.  Branch vessel disease also noted and managed medically.  2.  Ischemic cardiomyopathy, LVEF 30 to 35% at this point.  No LV mural thrombus.  3.  Mixed hyperlipidemia, now on atorvastatin 80 mg daily.  4.  Type 2 diabetes mellitus, has been on metformin and Farxiga as an outpatient.  Anticipate discharge home today.  LifeVest will be placed today prior to discharge.  Would discontinue IV amiodarone, do not plan to start oral amiodarone for discharge.  Continue aspirin, Plavix, bisoprolol, Farxiga, and Lipitor.  On Entresto as an outpatient, would hold resumption given current low to low normal blood pressures.  He will need outpatient follow-up in the next 7 to 10 days for reevaluation and further medication titration.  Signed, Rozann Lesches, MD  09/22/2020, 8:01 AM

## 2020-09-22 NOTE — Discharge Instructions (Signed)
Medication Changes: - START Plavix 75mg  daily in addition to the Aspirin 81mg  daily which you are already taking. These medication are very important in helping keep the new stent in your heart open. - START Bisoprolol 2.5mg  daily.  - INCREASE Atorvastatin (Lipitor) to 80mg  daily. - STOP Entresto for now. We may restart this at your follow-up visit depending on what your blood pressure looks like.  - Please stop Naproxen and avoid other NSAIDs (Ibuprofen, Motrin, Advil, Meloxicam). Recommend using Tylenol as needed for pain instead. - Continue all other medications as described elsewhere on discharge summary.  ** NO DRIVING for 6 months given you presented with ventricular tachycardia requiring defibrillation ** _______________  Post NSTEMI: NO HEAVY LIFTING X 2 WEEKS. NO SEXUAL ACTIVITY X 2 WEEKS. NO SOAKING BATHS, HOT TUBS, POOLS, ETC., X 7 DAYS.  Radial Site Care: Refer to this sheet in the next few weeks. These instructions provide you with information on caring for yourself after your procedure. Your caregiver may also give you more specific instructions. Your treatment has been planned according to current medical practices, but problems sometimes occur. Call your caregiver if you have any problems or questions after your procedure. HOME CARE INSTRUCTIONS  You may shower the day after the procedure.Remove the bandage (dressing) and gently wash the site with plain soap and water.Gently pat the site dry.   Do not apply powder or lotion to the site.   Do not submerge the affected site in water for 3 to 5 days.   Inspect the site at least twice daily.   Do not flex or bend the affected arm for 24 hours.   No lifting over 5 pounds (2.3 kg) for 5 days after your procedure.   Do not drive home if you are discharged the same day of the procedure. Have someone else drive you.  What to expect:  Any bruising will usually fade within 1 to 2 weeks.   Blood that collects in the tissue  (hematoma) may be painful to the touch. It should usually decrease in size and tenderness within 1 to 2 weeks.  SEEK IMMEDIATE MEDICAL CARE IF:  You have unusual pain at the radial site.   You have redness, warmth, swelling, or pain at the radial site.   You have drainage (other than a small amount of blood on the dressing).   You have chills.   You have a fever or persistent symptoms for more than 72 hours.   You have a fever and your symptoms suddenly get worse.   Your arm becomes pale, cool, tingly, or numb.   You have heavy bleeding from the site. Hold pressure on the site.  _______________  Heart Failure Education: 1. Weigh yourself EVERY morning after you go to the bathroom but before you eat or drink anything. Write this number down in a weight log/diary. If you gain 3 pounds overnight or 5 pounds in a week, call the office. 2. Take your medicines as prescribed. If you have concerns about your medications, please call us before you stop taking them.  3. Eat low salt foods--Limit salt (sodium) to 2000 mg per day. This will help prevent your body from holding onto fluid. Read food labels as many processed foods have a lot of sodium, especially canned goods and prepackaged meats. If you would like some assistance choosing low sodium foods, we would be happy to set you up with a nutritionist. 4. Limit all fluids for the day to less than 2  liters (64 ounces). Fluid includes all drinks, coffee, juice, ice chips, soup, jello, and all other liquids. 5. Stay as active as you can everyday. Staying active will give you more energy and make your muscles stronger. Start with 5 minutes at a time and work your way up to 30 minutes a day. Break up your activities--do some in the morning and some in the afternoon. Start with 3 days per week and work your way up to 5 days as you can.  If you have chest pain, feel short of breath, dizzy, or lightheaded, STOP. If you don't feel better after a short rest,  call 911. If you do feel better, call the office to let us know you have symptoms with exercise.

## 2020-09-24 ENCOUNTER — Encounter: Payer: Self-pay | Admitting: Family Medicine

## 2020-09-24 DIAGNOSIS — I5020 Unspecified systolic (congestive) heart failure: Secondary | ICD-10-CM | POA: Insufficient documentation

## 2020-09-24 LAB — HEMOGLOBIN A1C
Hgb A1c MFr Bld: 7.4 % — ABNORMAL HIGH (ref 4.8–5.6)
Mean Plasma Glucose: 166 mg/dL

## 2020-09-25 ENCOUNTER — Telehealth: Payer: Self-pay | Admitting: Physician Assistant

## 2020-09-25 NOTE — Telephone Encounter (Addendum)
**Note De-Identified Edom Schmuhl Obfuscation** Patient contacted regarding discharge from [facility], [facility], [facility] on 09/22/2020.  Patient understands to follow up with provider Richardson Dopp, PA-c on 09/28/2020 at 8:15 at [address], [address] [place] in [place], [place] [address]. Patient understands discharge instructions? Yes Patient understands medications and regiment? Yes Patient understands to bring all medications to this visit? Yes  Ask patient:  Are you enrolled in My Chart: No as the pt states he has no device that connects to the Internet at this time.  The pt state she is doing "good" and is without any c/o CP/discomfort, worsening SOB (he does have COPD with asthma), nausea, diaphoresis, dizziness or lightheadedness.  He states that he is currently wearing his Life Vest and that he has Dr [employer] office phone number at [facility] to call if he has any questions or concerns.  He thanked me for calling him.

## 2020-09-25 NOTE — Telephone Encounter (Signed)
    Pt is scheduled for TOC with Richardson Dopp on 09/28/20 at 8:15 am per staff message

## 2020-09-27 NOTE — Progress Notes (Addendum)
Cardiology Office Note:    Date:  09/28/2020   ID:  [name], DOB 1960-08-25, MRN [id number]  PCP:  Danna Hefty, DO   [facility] Providers Cardiologist:  Lauree Chandler, MD Electrophysiologist:  Cristopher Peru, MD      Referring MD: Danna Hefty, DO   Chief Complaint:  Hospitalization Follow-up (VT in the setting of NSTEMI requiring defib, PCI)    Patient Profile:    [name] is a 60 y.o. male with:  Coronary artery disease Anterior STEMI in 2008 s/p DES to LAD NSTEMI 6/22 s/p DES to RCA (presented in VT requiring defib) Patent LAD stent; branch vessel dz managed medically (HFrEF) heart failure with reduced ejection fraction  Ischemic CM, EF 45 at time of MI Echocardiogram 3/21: EF 35-40  Echo 6/22: EF 30-35, apex aneurysmal, no LV clot Ventricular tachycardia In setting of NSTEMI in 6/22 >> defib LifeVest>>repeat Echocardiogram 90 days post PCI Diabetes mellitus  Hyperlipidemia  Hypertension Asthma GERD   Prior CV studies: Echocardiogram 09/21/2020 EF 30-35, anteroseptal/inferoseptal/apical anterior/apical inferior/apical lateral wall motion abnormality, aneurysmal apex, no LV thrombus, mild asymmetric LVH, GR 1 DD, normal RVSF, trivial MR, AV sclerosis without stenosis   LEFT HEART CATH 09/20/2020 LAD proximal stent patent; D2 90 LCx proximal 30, distal 100; OM1 90 RCA proximal 80 PCI: 2.25 x 18 mm Medtronic resolute Onyx DES to RCA    History of Present Illness: [name] was last seen by Dr. Angelena Form in 4/21.        The patient was admitted 6/2-6/4 with sustained ventricular tachycardia in the setting of non-STEMI.  He received IV amiodarone bolus in route via EMS but did not convert and underwent defibrillation in the emergency room.  Cardiac catheterization demonstrated severe disease in the RCA which was treated with PCI with a DES.  LAD stent was patent.  He had severe branch vessel disease which is to be treated medically.   Echocardiogram demonstrated EF 30-35%.  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  There was no LV thrombus noted.  He was seen by electrophysiology (Dr. Lovena Le).  LifeVest was recommended at discharge with reassessment of LV function 3 months post PCI.  If his EF is >35% 3 months post PCI, he would need an EP study.  If his EF remains <35%, he would need ICD.  Amiodarone was discontinued.  He was discharged on aspirin, clopidogrel, bisoprolol, dapagliflozin.  His Entresto was held at discharge due to hypotension.  He returns for follow-up.  He is here today with his daughter.  Since discharge, he has been doing well.  He does note that, prior to his heart attack, he would skip his medications at times.  He would sometimes go a month with medicines in a month without.  He has gotten all of his medications since discharge.  He has not had chest discomfort, shortness of breath, orthopnea, leg edema.  He has not had syncope.  He has not had any alarms or discharges from his LifeVest.  He is currently applying for disability.  He works in Engineer, drilling.    Past Medical History:  Diagnosis Date   Asthma    CAD (coronary artery disease)    a. CAD s/p anterior MI in 2008 treated with DES to the LAD.   Cataract 2018   both eyes   Cellulitis of leg, right 10/2014   COPD (chronic obstructive pulmonary disease) (HCC)    Dental caries    Diabetes mellitus    TYPE 2  GERD (gastroesophageal reflux disease)    Hyperlipidemia    Hypertension 2017   Ischemic cardiomyopathy    Leukocytosis    noted on labs   Microcytic anemia    Myocardial infarction [facility]) 2008   Reactive airway disease    Thyroid nodule    biopsy negative 2006    Current Medications: Current Meds  Medication Sig   acetaminophen (TYLENOL) 500 MG tablet Take 2 tablets (1,000 mg total) by mouth every 8 (eight) hours.   ADVAIR HFA 230-21 MCG/ACT inhaler INHALE 2 PUFFS INTO THE LUNGS 2 TIMES A DAY.   aspirin EC 81 MG tablet Take 81 mg by mouth  daily.   atorvastatin (LIPITOR) 80 MG tablet Take 1 tablet (80 mg total) by mouth at bedtime.   bisoprolol (ZEBETA) 5 MG tablet Take 0.5 tablets (2.5 mg total) by mouth daily.   calcium-vitamin D (OSCAL WITH D) 250-125 MG-UNIT tablet Take 1 tablet by mouth 2 (two) times daily.   clopidogrel (PLAVIX) 75 MG tablet Take 1 tablet (75 mg total) by mouth daily with breakfast.   FARXIGA 10 MG TABS tablet TAKE 1 TABLET (10MG TOTAL) BY MOUTH DAILY.   ferrous sulfate 325 (65 FE) MG tablet Take 325 mg by mouth daily with breakfast.   metFORMIN (GLUCOPHAGE) 1000 MG tablet Take 1 tablet (1,000 mg total) by mouth 2 (two) times daily with a meal.   Multiple Vitamin (MULTIVITAMIN WITH MINERALS) TABS tablet Take 1 tablet by mouth daily.   nitroGLYCERIN (NITROSTAT) 0.4 MG SL tablet Place 1 tablet (0.4 mg total) under the tongue every 5 (five) minutes x 3 doses as needed for chest pain.   pantoprazole (PROTONIX) 20 MG tablet Take 1 tablet (20 mg total) by mouth daily.   predniSONE (DELTASONE) 10 MG tablet Take 1 tablet by mouth once daily with breakfast   PROAIR HFA 108 (90 Base) MCG/ACT inhaler INHALE 2 PUFFS INTO THE LUNGS EVERY 4 HOURS AS NEEDED FOR WHEEZING OR SHORTNESS OF BREATH (PLAN B).   valsartan (DIOVAN) 40 MG tablet Take 1 tablet (40 mg total) by mouth daily.     Allergies:   Patient has no known allergies.   Social History   Tobacco Use   Smoking status: Former    Packs/day: 0.50    Years: 22.00    Pack years: 11.00    Types: Cigarettes    Quit date: 04/21/2006    Years since quitting: 14.4   Smokeless tobacco: Never   Tobacco comments:    quit 10 years ago  Vaping Use   Vaping Use: Never used  Substance Use Topics   Alcohol use: No    Alcohol/week: 0.0 standard drinks   Drug use: No     Family Hx: The patient's family history includes Diabetes in his mother; Heart attack (age of onset: 86) in his brother. There is no history of Colon cancer, Esophageal cancer, Rectal cancer, or  Stomach cancer.  Review of Systems  Constitutional: Negative for weight gain.    EKGs/Labs/Other Test Reviewed:    EKG:  EKG is  ordered today.  The ekg ordered today demonstrates sinus bradycardia, HR 55, inferior Q waves, anteroseptal Q waves, inferolateral T wave inversions, QTC 413, similar to prior tracings  Recent Labs: 09/20/2020: ALT 19 09/21/2020: Hemoglobin 13.0; Platelets 203 09/22/2020: BUN 9; Creatinine, Ser 1.20; Magnesium 2.1; Potassium 4.0; Sodium 139   Recent Lipid Panel Lab Results  Component Value Date/Time   CHOL 130 09/21/2020 03:00 AM   CHOL 129 01/10/2020  03:53 PM   TRIG 98 09/21/2020 03:00 AM   HDL 40 (L) 09/21/2020 03:00 AM   HDL 57 01/10/2020 03:53 PM   LDLCALC 70 09/21/2020 03:00 AM   LDLCALC 38 01/10/2020 03:53 PM   LDLDIRECT 91 08/14/2017 04:43 PM   LDLDIRECT 98 07/28/2012 05:01 PM      Risk Assessment/Calculations:      Physical Exam:    VS:  BP 122/62   Pulse (!) 55   Ht '5\' 7"'  (1.702 m)   Wt 148 lb (67.1 kg)   SpO2 96%   BMI 23.18 kg/m     Wt Readings from Last 3 Encounters:  09/28/20 148 lb (67.1 kg)  09/22/20 144 lb 9.6 oz (65.6 kg)  08/07/20 147 lb 6.4 oz (66.9 kg)     Constitutional:      Appearance: Healthy appearance. Not in distress.  Neck:     Vascular: JVD normal.  Pulmonary:     Breath sounds: No wheezing. No rales.  Cardiovascular:     Normal rate. Regular rhythm. Normal S1. Normal S2.      Murmurs: There is no murmur.     Comments: R wrist without hematoma Edema:    Peripheral edema absent.  Abdominal:     Palpations: Abdomen is soft. There is no hepatomegaly.  Skin:    General: Skin is warm and dry.  Neurological:     General: No focal deficit present.     Mental Status: Alert and oriented to person, place and time.     Cranial Nerves: Cranial nerves are intact.         ASSESSMENT & PLAN:    1. NSTEMI (non-ST elevation myocardial infarction) [facility]) History of prior anterior STEMI in 2008 treated with a  drug-eluting stent to the LAD.  He recently presented with ventricular tachycardia complicated by hypotension in the setting of a non-STEMI.  He required defibrillation and cardiac catheterization demonstrated high-grade stenosis in the proximal RCA.  This was treated with a DES.  LAD stent was patent.  There was branch vessel disease that will be managed medically.  He is currently doing well without anginal symptoms.  We discussed the importance of uninterrupted dual antiplatelet therapy for the next 12 months.  Given his driving restrictions, he does not think he can attend cardiac rehabilitation.  He is also unable to work at this time given the need for LifeVest and the inability to drive.  He is currently applying for disability.  I have provided him with a letter today to cover him for at least the next 6 months as we continue to follow-up on his management.  -Continue aspirin, clopidogrel, bisoprolol, atorvastatin.  -Follow-up with Dr. Angelena Form in 6 to 8 weeks  2. Ventricular tachycardia ([place]) As noted, he required defibrillation in the emergency room due to hypotension.  He was on amiodarone briefly but this was discontinued prior to discharge.  He remains on beta-blocker therapy.  He was seen by EP (Dr. Lovena Le).  Plan is to repeat an echocardiogram 3 months post MI to reassess LV function.  If his EF is greater than 35%, he will require EPS.  If his EF is less than 35%, he will require ICD implantation.  No driving for 6 months.  3. HFrEF (heart failure with reduced ejection fraction) (HCC) EF 30-35.  Ischemic cardiomyopathy.  NYHA II.  Volume status stable.  Hypotension limited GDMT in the hospital.  Continue current dose of bisoprolol, dapagliflozin.  He was previously on Entresto.  However, I do not think he will be able to tolerate Entresto even at the low dose.  Therefore, I will place him on valsartan 40 mg daily.  I will have him see the pharmacist in 2 to 3 weeks.  If his blood pressure can  tolerate, we can consider increasing valsartan to twice daily versus transitioning to Entresto 24/26 mg twice daily.  Obtain follow-up BMET in 1 to 2 weeks.  4. Ischemic cardiomyopathy As noted, follow-up echocardiogram will be arranged in 90 days to reassess ejection fraction.  5. Essential hypertension The patient's blood pressure is controlled on his current regimen.  Medications will be adjusted for CHF as outlined above.  6. Mixed hyperlipidemia Continue high intensity statin therapy.  Arrange fasting c-Met, lipids in 6 to 8 weeks.       Dispo:  Return in about 8 weeks (around 11/23/2020) for Routine Follow Up with Dr. Odessa Fleming.   Medication Adjustments/Labs and Tests Ordered: Current medicines are reviewed at length with the patient today.  Concerns regarding medicines are outlined above.  Tests Ordered: Orders Placed This Encounter  Procedures   Basic Metabolic Panel (BMET)   Lipid Profile   Comp Met (CMET)   AMB Referral to [facility]care Pharm-D   EKG 12-Lead   ECHOCARDIOGRAM LIMITED    Medication Changes: Meds ordered this encounter  Medications   valsartan (DIOVAN) 40 MG tablet    Sig: Take 1 tablet (40 mg total) by mouth daily.    Dispense:  90 tablet    Refill:  3     Signed, Richardson Dopp, PA-C  09/28/2020 10:25 AM    [facility] [place], [place], [place]  [address] Phone: [phone]; Fax: [phone]

## 2020-09-28 ENCOUNTER — Ambulatory Visit (INDEPENDENT_AMBULATORY_CARE_PROVIDER_SITE_OTHER): Payer: No Typology Code available for payment source | Admitting: Physician Assistant

## 2020-09-28 ENCOUNTER — Other Ambulatory Visit: Payer: Self-pay

## 2020-09-28 ENCOUNTER — Encounter: Payer: Self-pay | Admitting: Physician Assistant

## 2020-09-28 VITALS — BP 122/62 | HR 55 | Ht 67.0 in | Wt 148.0 lb

## 2020-09-28 DIAGNOSIS — I255 Ischemic cardiomyopathy: Secondary | ICD-10-CM

## 2020-09-28 DIAGNOSIS — I5020 Unspecified systolic (congestive) heart failure: Secondary | ICD-10-CM

## 2020-09-28 DIAGNOSIS — I10 Essential (primary) hypertension: Secondary | ICD-10-CM

## 2020-09-28 DIAGNOSIS — E782 Mixed hyperlipidemia: Secondary | ICD-10-CM

## 2020-09-28 DIAGNOSIS — I214 Non-ST elevation (NSTEMI) myocardial infarction: Secondary | ICD-10-CM

## 2020-09-28 DIAGNOSIS — I472 Ventricular tachycardia, unspecified: Secondary | ICD-10-CM

## 2020-09-28 MED ORDER — VALSARTAN 40 MG PO TABS: 40.0000 mg | ORAL_TABLET | Freq: Every day | ORAL | 3 refills | Status: DC

## 2020-09-28 NOTE — Patient Instructions (Signed)
Medication Instructions:  START Valsartan one tablet by mouth ( 40 mg) daily, sent # 90 to requested pharmacy.   *If you need a refill on your cardiac medications before your next appointment, please call your pharmacy*   Lab Work: Your physician recommends that you return for lab work on Thursday, June 16 between 7:30 - 4:30.   Your physician recommends that you return for a FASTING lipid profile/cmet on Friday, July 22 between 7:30 - 4:30 fasting from midnight the night before.   If you have labs (blood work) drawn today and your tests are completely normal, you will receive your results only by: [facility] (if you have MyChart) OR A paper copy in the mail If you have any lab test that is abnormal or we need to change your treatment, we will call you to review the results.   Testing/Procedures: Your physician has requested that you have an echocardiogram. Friday, September 9 9:05 AM. Echocardiography is a painless test that uses sound waves to create images of your heart. It provides your doctor with information about the size and shape of your heart and how well your heart's chambers and valves are working. This procedure takes approximately one hour. There are no restrictions for this procedure.    Follow-Up: At [facility], you and your health needs are our priority.  As part of our continuing mission to provide you with exceptional heart care, we have created designated Provider Care Teams.  These Care Teams include your primary Cardiologist (physician) and Advanced Practice Providers (APPs -  Physician Assistants and Nurse Practitioners) who all work together to provide you with the care you need, when you need it.  We recommend signing up for the patient portal called "MyChart".  Sign up information is provided on this After Visit Summary.  MyChart is used to connect with patients for Virtual Visits (Telemedicine).  Patients are able to view lab/test results, encounter notes,  upcoming appointments, etc.  Non-urgent messages can be sent to your provider as well.   To learn more about what you can do with MyChart, go to [email or web address].    Your next appointment:   8 week(s) WITH DR. Angelena Form ON Wednesday, AUGUST 3 @ 2:30 PM.   The format for your next appointment:   In Person  Provider:   Lauree Chandler, MD   Other Instructions You have been referred TO THE PHARMACIST FOR MEDICATION TITRATION, PLEASE BRING ALL YOUR MEDICATIONS ON Thursday, June 30 @ 9:00 AM.

## 2020-10-02 ENCOUNTER — Other Ambulatory Visit: Payer: Self-pay | Admitting: Cardiovascular Disease

## 2020-10-03 ENCOUNTER — Telehealth (HOSPITAL_COMMUNITY): Payer: Self-pay

## 2020-10-03 NOTE — Telephone Encounter (Signed)
Attempted to contact pt in regards to CR. LMTCB

## 2020-10-04 ENCOUNTER — Other Ambulatory Visit: Payer: No Typology Code available for payment source | Admitting: *Deleted

## 2020-10-04 ENCOUNTER — Other Ambulatory Visit: Payer: Self-pay

## 2020-10-04 DIAGNOSIS — I5020 Unspecified systolic (congestive) heart failure: Secondary | ICD-10-CM

## 2020-10-04 DIAGNOSIS — I255 Ischemic cardiomyopathy: Secondary | ICD-10-CM

## 2020-10-04 DIAGNOSIS — I472 Ventricular tachycardia, unspecified: Secondary | ICD-10-CM

## 2020-10-04 DIAGNOSIS — I10 Essential (primary) hypertension: Secondary | ICD-10-CM

## 2020-10-04 DIAGNOSIS — I214 Non-ST elevation (NSTEMI) myocardial infarction: Secondary | ICD-10-CM

## 2020-10-04 DIAGNOSIS — E782 Mixed hyperlipidemia: Secondary | ICD-10-CM

## 2020-10-04 LAB — BASIC METABOLIC PANEL
BUN/Creatinine Ratio: 12 (ref 10–24)
BUN: 15 mg/dL (ref 8–27)
CO2: 24 mmol/L (ref 20–29)
Calcium: 10.1 mg/dL (ref 8.6–10.2)
Chloride: 100 mmol/L (ref 96–106)
Creatinine, Ser: 1.21 mg/dL (ref 0.76–1.27)
Glucose: 115 mg/dL — ABNORMAL HIGH (ref 65–99)
Potassium: 4.2 mmol/L (ref 3.5–5.2)
Sodium: 140 mmol/L (ref 134–144)
eGFR: 68 mL/min/{1.73_m2} (ref >59)

## 2020-10-16 ENCOUNTER — Encounter (HOSPITAL_COMMUNITY): Payer: Self-pay | Admitting: Cardiovascular Disease

## 2020-10-18 ENCOUNTER — Telehealth: Payer: Self-pay | Admitting: Pharmacist

## 2020-10-18 ENCOUNTER — Other Ambulatory Visit: Payer: Self-pay

## 2020-10-18 ENCOUNTER — Telehealth (HOSPITAL_COMMUNITY): Payer: Self-pay

## 2020-10-18 ENCOUNTER — Telehealth: Payer: Self-pay | Admitting: Licensed Clinical Social Worker

## 2020-10-18 ENCOUNTER — Ambulatory Visit (INDEPENDENT_AMBULATORY_CARE_PROVIDER_SITE_OTHER): Payer: No Typology Code available for payment source | Admitting: Pharmacist

## 2020-10-18 VITALS — BP 142/70 | HR 64 | Wt 147.0 lb

## 2020-10-18 DIAGNOSIS — I5020 Unspecified systolic (congestive) heart failure: Secondary | ICD-10-CM

## 2020-10-18 DIAGNOSIS — IMO0002 Reserved for concepts with insufficient information to code with codable children: Secondary | ICD-10-CM

## 2020-10-18 DIAGNOSIS — I2510 Atherosclerotic heart disease of native coronary artery without angina pectoris: Secondary | ICD-10-CM

## 2020-10-18 DIAGNOSIS — I10 Essential (primary) hypertension: Secondary | ICD-10-CM

## 2020-10-18 MED ORDER — VALSARTAN 40 MG PO TABS
40.0000 mg | ORAL_TABLET | Freq: Every day | ORAL | 3 refills | Status: DC
  Filled 2020-10-18: qty 30, 30d supply, fill #0

## 2020-10-18 MED ORDER — CLOPIDOGREL BISULFATE 75 MG PO TABS
75.0000 mg | ORAL_TABLET | Freq: Every day | ORAL | 11 refills | Status: DC
  Filled 2020-10-18: qty 30, 30d supply, fill #0

## 2020-10-18 MED ORDER — METFORMIN HCL 1000 MG PO TABS
1000.0000 mg | ORAL_TABLET | Freq: Two times a day (BID) | ORAL | 1 refills | Status: DC
  Filled 2020-10-18: qty 60, 30d supply, fill #0

## 2020-10-18 MED ORDER — ATORVASTATIN CALCIUM 80 MG PO TABS
80.0000 mg | ORAL_TABLET | Freq: Every day | ORAL | 2 refills | Status: DC
  Filled 2020-10-18: qty 30, 30d supply, fill #0

## 2020-10-18 MED ORDER — ASPIRIN EC 81 MG PO TBEC
81.0000 mg | DELAYED_RELEASE_TABLET | Freq: Every day | ORAL | 2 refills | Status: DC
  Filled 2020-10-18: qty 30, 30d supply, fill #0

## 2020-10-18 MED ORDER — BISOPROLOL FUMARATE 5 MG PO TABS
2.5000 mg | ORAL_TABLET | Freq: Every day | ORAL | 2 refills | Status: DC
  Filled 2020-10-18: qty 15, 30d supply, fill #0

## 2020-10-18 NOTE — Progress Notes (Signed)
Heart and Vascular Care Navigation  10/18/2020  [name] [birth year] [id number]  Reason for Referral:  Engaged with patient by telephone for initial visit for Heart and Vascular Care Coordination.                                                                                                   Assessment:                                    LCSW received a referral from pharmacy team at [facility] office. Pt shared challenges with medication costs at this time. I reached out to [employer] financial counselors to see if pt eligible for Medicaid. They state that pt had told them he had a pending application for Medicaid at this time.   LCSW reached out to pt at [phone]. Introduced self, role, reason for call. Pt confirmed home address, PCP; he lives with his wife there and they have two adult children in the area. It has been touch financially as he has been out of work, his wife does not work due to uncontrolled diabetes. His son and daughter generally assist with finances, however his son has recently lost his job so it is just his daughter and son in law assisting at this time. He does receive some medications through the Health Department and has regularly applied for/been approved for [employer] and CAFA assistance. I was able to confirm with pt that he has applied for Medicaid w/ DSS, states he received additional paperwork which he has completed and returned to them yesterday.   I shared that pt would need to wait to hear a determination regarding his Medicaid but if he is denied then he can re-apply for [employer] and [employer]  at that time. I shared that pt may be eligible for assistance with some of his medication through [facility] program. Inquired if pt interested in applying and pt amenable. I will send through that application to him and his wife with instructions for applying. I recommended that pt f/u with his PCP office and I will f/u  with cardiology to get his medications transferred to [place] as pt may also be eligible for assistance via [place] program.   I offered to send additional resources to pt as well regarding potential assistance for costs of living. Pt agreeable.  HRT/VAS Care Coordination     Patients Home Cardiology Office [facility]   Outpatient Care Team Social Worker   Social Worker Name: Valeda Malm, [place] [phone]   Living arrangements for the past 2 months Apartment   Lives with: Spouse   Patient Current Insurance Coverage Self-Pay   Patient Has Concern With Paying Medical Bills Yes   Patient Concerns With Medical Bills no coverage, ongoing medical care, recent hospitalization   Medical Bill Referrals: pt states he has a pending Medicaid application as of 08/23/37   Does Patient Have Prescription Coverage? No   Patient Prescription Assistance Programs [place] [employer];  [employer]; Other   Blue Card Medications directed pt to have his medications transferred to [facility] and request [employer] application   [place] [employer] Medications mailed assistance application   Other Assistance Programs Medications receives [place] via Health Department per his report   Home Assistive Devices/Equipment None       Social History:                                                                             SDOH Screenings   Alcohol Screen: Not on file  Depression (PHQ2-9): Low Risk    PHQ-2 Score: 3  Financial Resource Strain: High Risk   Difficulty of Paying Living Expenses: Hard  Food Insecurity: No Food Insecurity   Worried About Charity fundraiser in the Last Year: Never true   Ran Out of Food in the Last Year: Never true  Housing: Medium Risk   Last Housing Risk Score: 1  Physical Activity: Not on file  Social Connections: Not on file  Stress: Not on file  Tobacco Use: Medium Risk   Smoking Tobacco Use: Former   Smokeless Tobacco Use: Never  Transportation  Needs: No Transportation Needs   Lack of Transportation (Medical): No   Lack of Transportation (Non-Medical): No    SDOH Interventions: Financial Resources:  Sales promotion account executive Interventions: Development worker, community, Other (Comment) (CAFA/[place] card mailed but pt not eligible until determination made for Medicaid; [employer]; food pantry resources/SNAP application) DSS for financial assistance  Food Insecurity:  Food Insecurity Interventions: Intervention Not Indicated (family currently assisting- will send SNAP application)  Housing Insecurity:  Housing Interventions: Other (Comment) (provided w/ assistance resource packet- pt daughter currently assisting w/ costs)  Transportation:   Transportation Interventions: Intervention Not Indicated (pt daughter drives them to appointments)     Other Care Navigation Interventions:     Provided Pharmacy assistance resources [place] [facility], [employer], Other (Health Department)  Patient expressed Mental Health concerns No.   Follow-up plan:   LCSW f/u with [facility] to request pt medications managed by their providers to be sent to [place]. Pt was instructed to call PCP and do the same this week since he only has one week of medications left. LCSW has mailed pt [employer] application, CAFA/[place] Card (with instructions not to complete until Medicaid determination), transportation resources, food resources/SNAP application, and my card. I will f/u with pt when back in the office on July 11th to ensure received and answer any questions.

## 2020-10-18 NOTE — Patient Instructions (Signed)
It was nice meeting you today  We would like your blood pressure to stay less than 130/80  Please call me with your blood pressure reading about 2 hours after you take your morning medication today  Continue Farxiga 10 mg daily Continue valsartan 40 mg daily  I will contact the social worker to get in touch with you regarding the cost of your medicines  Please try to eat less than 1500 mg of salt a day  Please try to weigh yourself every morning and if you gain more than 3 pounds overnight of 5 pounds in a week, let us know  Please call with any questions  Karren Cobble, PharmD, BCACP, [place], [place] [address], [place], [place] [address] Phone: [phone]; Fax: [phone] 10/18/2020 9:32 AM

## 2020-10-18 NOTE — Progress Notes (Signed)
Patient ID: [name]                 DOB: 01-18-1961                      MRN: [id number]     HPI: Vir Whetstine is a 60 y.o. male referred by Richardson Dopp to HTN clinic. PMH is significant for CAD, CHF (last EF 30-35%), DM (A1c 7.4%) , asthma, HLD, and history of MI.   Had cardiac cath on 09/20/20 after reporting to ED with chest pain. Discharged on bisoprolol and [place].  Entresto was stopped due to soft BP.  At last visit with Richardson Dopp, valsartan was added in place of Entresto.  Patient presents today with wife.  Reports he fells much better.  Denies chest pain, SOB, DOE, or swelling.  Reports occasionally will be dizzy after he lays down in bed. Has had no side effects to valsartan.  Wife makes sure he takes his medication.  Chief complaint today is concern over affording medications.  Charlton Haws and [place] through patient assistance.  However when Delene Loll was stopped he had to pay cash price for valsartan which was 80 dollars.  He is not currently working so he said he no longer qualifies for the [employer].  Reports home BP readings of 117/60 and 127/70 but did not bring cuff or log.  Uses automatic upper arm cuff.  Says his systolic has not been over 938 and his diastolic has not been above 70.  Is worried that his pulse is low since it is frequently in the 50s.  Is not weighing himself at home but does have a scale.  Has not been watching his salt intake.  He has not taken his medications yet today.  Current HTN/CHF meds: valsartan 40mg , bisoprolol 2.5mg  BID, Farxiga 10mg  daily Previously tried: [place] (discontinued in hospital due to low BP) BP goal: <130/80  Social History: no tobacco, no Etoh   Wt Readings from Last 3 Encounters:  09/28/20 148 lb (67.1 kg)  09/22/20 144 lb 9.6 oz (65.6 kg)  08/07/20 147 lb 6.4 oz (66.9 kg)   BP Readings from Last 3 Encounters:  09/28/20 122/62  09/22/20 (!) 109/58  08/07/20 118/62   Pulse Readings from Last 3  Encounters:  09/28/20 (!) 55  09/22/20 (!) 58  08/07/20 84    Renal function: CrCl cannot be calculated (Unknown ideal weight.).  Past Medical History:  Diagnosis Date   Asthma    CAD (coronary artery disease)    a. CAD s/p anterior MI in 2008 treated with DES to the LAD.   Cataract 2018   both eyes   Cellulitis of leg, right 10/2014   COPD (chronic obstructive pulmonary disease) (HCC)    Dental caries    Diabetes mellitus    TYPE 2   GERD (gastroesophageal reflux disease)    Hyperlipidemia    Hypertension 2017   Ischemic cardiomyopathy    Leukocytosis    noted on labs   Microcytic anemia    Myocardial infarction [facility]) 2008   Reactive airway disease    Thyroid nodule    biopsy negative 2006    Current Outpatient Medications on File Prior to Visit  Medication Sig Dispense Refill   acetaminophen (TYLENOL) 500 MG tablet Take 2 tablets (1,000 mg total) by mouth every 8 (eight) hours. 30 tablet 1   ADVAIR HFA 230-21 MCG/ACT inhaler INHALE 2 PUFFS INTO THE LUNGS 2 TIMES A DAY.  1 each 2   aspirin EC 81 MG tablet Take 81 mg by mouth daily.     atorvastatin (LIPITOR) 80 MG tablet Take 1 tablet (80 mg total) by mouth at bedtime. 30 tablet 2   bisoprolol (ZEBETA) 5 MG tablet Take 0.5 tablets (2.5 mg total) by mouth daily. 15 tablet 2   calcium-vitamin D (OSCAL WITH D) 250-125 MG-UNIT tablet Take 1 tablet by mouth 2 (two) times daily.     clopidogrel (PLAVIX) 75 MG tablet Take 1 tablet (75 mg total) by mouth daily with breakfast. 30 tablet 11   FARXIGA 10 MG TABS tablet TAKE 1 TABLET (10MG  TOTAL) BY MOUTH DAILY. 90 tablet 0   ferrous sulfate 325 (65 FE) MG tablet Take 325 mg by mouth daily with breakfast.     metFORMIN (GLUCOPHAGE) 1000 MG tablet Take 1 tablet (1,000 mg total) by mouth 2 (two) times daily with a meal. 180 tablet 1   Multiple Vitamin (MULTIVITAMIN WITH MINERALS) TABS tablet Take 1 tablet by mouth daily.     nitroGLYCERIN (NITROSTAT) 0.4 MG SL tablet Place 1 tablet  (0.4 mg total) under the tongue every 5 (five) minutes x 3 doses as needed for chest pain. 25 tablet 2   pantoprazole (PROTONIX) 20 MG tablet Take 1 tablet (20 mg total) by mouth daily. 30 tablet 0   predniSONE (DELTASONE) 10 MG tablet Take 1 tablet by mouth once daily with breakfast 30 tablet 0   PROAIR HFA 108 (90 Base) MCG/ACT inhaler INHALE 2 PUFFS INTO THE LUNGS EVERY 4 HOURS AS NEEDED FOR WHEEZING OR SHORTNESS OF BREATH (PLAN B). 8 each 5   valsartan (DIOVAN) 40 MG tablet Take 1 tablet (40 mg total) by mouth daily. 90 tablet 3   No current facility-administered medications on file prior to visit.    No Known Allergies   Assessment/Plan:  1. Hypertension/CHF -  Patient BP in room today 142/70 and pulse rate 64 which is above goal of <130/80.  Also elevated compared to his home readings and hospital readings. However, has not taken his morning medications this morning.  Instructed patient to take morning medications and to call me back 2 hours later with home BP reading.  Will consider increasing valsartan if needed.  Patient also receives Entresto for free from patient assistance so that remains an option if he can tolerate.  Patient's greatest concern is affording medications.  Sent information over to LCSW team to reach out to patient to explore affordability options.  Recommended patient begin weighing himself daily and to call clinic if he gains more than 3# overnight or over 5# in a week.  Recommended restricting sodium to less than 1500mg  a day.  Lab work since starting valsartan was normal.  Has recheck scheduled with cardiologist in late July.  No recheck needed with pharmacy unless BP remains elevated at home.  Continue bisoprolol 2.5mg  BID Continue Farxiga 10mg  daily Continue valsartan 40mg  daily Recheck as needed  Karren Cobble, PharmD, BCACP, [place], [place] [address] [place], [place] [address] Phone: [phone]; Fax: [phone] 10/18/2020 10:07 AM

## 2020-10-18 NOTE — Telephone Encounter (Signed)
Pt is interested in the cardiac rehab VCR program. I advised pt of the backlog of 1-3 months and that we would give him a call at a later date to schedule.

## 2020-10-18 NOTE — Telephone Encounter (Signed)
Per LCSW, patient only has ~3 days of medications left.  She recommended they be sent to [facility] as they may be able to afford financial assistance.

## 2020-10-23 ENCOUNTER — Other Ambulatory Visit: Payer: Self-pay

## 2020-10-24 ENCOUNTER — Other Ambulatory Visit: Payer: Self-pay | Admitting: Family Medicine

## 2020-10-24 DIAGNOSIS — IMO0002 Reserved for concepts with insufficient information to code with codable children: Secondary | ICD-10-CM

## 2020-10-31 ENCOUNTER — Telehealth: Payer: Self-pay | Admitting: Licensed Clinical Social Worker

## 2020-10-31 NOTE — Telephone Encounter (Signed)
LCSW f/u with pt this afternoon via telephone, at [phone], to check if pt had received [place] [facility] application. Pt shares he has received the application, completed it and mailed it back in to [facility]. LCSW will assist pt by f/u with [facility] regarding application status on Friday, 7/15.  Westley Hummer, MSW, [place]  [phone]

## 2020-11-02 ENCOUNTER — Telehealth: Payer: Self-pay | Admitting: Licensed Clinical Social Worker

## 2020-11-02 NOTE — Telephone Encounter (Signed)
LCSW f/u with [facility], no application received yet.  Per pt it had been mailed last week. LCSW will f/u again next week to see if received.   Westley Hummer, MSW, [place]  [phone]

## 2020-11-07 NOTE — Telephone Encounter (Signed)
Called patient to see if he was interested in participating in the Cardiac Rehab Program. Patient stated yes for the virtual cardiac rehab only. Patient will come in for orientation on 12/11/2020@1 :15pm.   Mailed package.

## 2020-11-09 ENCOUNTER — Other Ambulatory Visit: Payer: Self-pay | Admitting: *Deleted

## 2020-11-09 ENCOUNTER — Other Ambulatory Visit: Payer: Self-pay

## 2020-11-09 DIAGNOSIS — I5020 Unspecified systolic (congestive) heart failure: Secondary | ICD-10-CM

## 2020-11-09 DIAGNOSIS — I10 Essential (primary) hypertension: Secondary | ICD-10-CM

## 2020-11-09 DIAGNOSIS — I214 Non-ST elevation (NSTEMI) myocardial infarction: Secondary | ICD-10-CM

## 2020-11-09 DIAGNOSIS — E782 Mixed hyperlipidemia: Secondary | ICD-10-CM

## 2020-11-09 DIAGNOSIS — I255 Ischemic cardiomyopathy: Secondary | ICD-10-CM

## 2020-11-09 DIAGNOSIS — I472 Ventricular tachycardia, unspecified: Secondary | ICD-10-CM

## 2020-11-09 LAB — LIPID PANEL
Chol/HDL Ratio: 1.7 ratio (ref 0.0–5.0)
Cholesterol, Total: 101 mg/dL (ref 100–199)
HDL: 59 mg/dL (ref >39)
LDL Chol Calc (NIH): 20 mg/dL (ref 0–99)
Triglycerides: 129 mg/dL (ref 0–149)
VLDL Cholesterol Cal: 22 mg/dL (ref 5–40)

## 2020-11-09 LAB — COMPREHENSIVE METABOLIC PANEL
ALT: 31 IU/L (ref 0–44)
AST: 15 IU/L (ref 0–40)
Albumin/Globulin Ratio: 1.4 (ref 1.2–2.2)
Albumin: 4.2 g/dL (ref 3.8–4.8)
Alkaline Phosphatase: 61 IU/L (ref 44–121)
BUN/Creatinine Ratio: 19 (ref 10–24)
BUN: 21 mg/dL (ref 8–27)
Bilirubin Total: 0.2 mg/dL (ref 0.0–1.2)
CO2: 21 mmol/L (ref 20–29)
Calcium: 9.8 mg/dL (ref 8.6–10.2)
Chloride: 103 mmol/L (ref 96–106)
Creatinine, Ser: 1.09 mg/dL (ref 0.76–1.27)
Globulin, Total: 3.1 g/dL (ref 1.5–4.5)
Glucose: 100 mg/dL — ABNORMAL HIGH (ref 65–99)
Potassium: 4.3 mmol/L (ref 3.5–5.2)
Sodium: 141 mmol/L (ref 134–144)
Total Protein: 7.3 g/dL (ref 6.0–8.5)
eGFR: 77 mL/min/{1.73_m2} (ref >59)

## 2020-11-14 ENCOUNTER — Telehealth: Payer: Self-pay | Admitting: Licensed Clinical Social Worker

## 2020-11-14 ENCOUNTER — Telehealth: Payer: Self-pay | Admitting: Pharmacist

## 2020-11-14 DIAGNOSIS — I214 Non-ST elevation (NSTEMI) myocardial infarction: Secondary | ICD-10-CM

## 2020-11-14 DIAGNOSIS — E1165 Type 2 diabetes mellitus with hyperglycemia: Secondary | ICD-10-CM

## 2020-11-14 DIAGNOSIS — IMO0002 Reserved for concepts with insufficient information to code with codable children: Secondary | ICD-10-CM

## 2020-11-14 DIAGNOSIS — I2510 Atherosclerotic heart disease of native coronary artery without angina pectoris: Secondary | ICD-10-CM

## 2020-11-14 MED ORDER — ASPIRIN EC 81 MG PO TBEC: 81.0000 mg | DELAYED_RELEASE_TABLET | Freq: Every day | ORAL | 11 refills | Status: AC

## 2020-11-14 MED ORDER — CLOPIDOGREL BISULFATE 75 MG PO TABS: 75.0000 mg | ORAL_TABLET | Freq: Every day | ORAL | 11 refills | Status: DC

## 2020-11-14 MED ORDER — ATORVASTATIN CALCIUM 80 MG PO TABS: 80.0000 mg | ORAL_TABLET | Freq: Every day | ORAL | 11 refills | Status: DC

## 2020-11-14 NOTE — Telephone Encounter (Signed)
Patient approved by [facility].  Per LCSW, send prescriptions to [facility].  Rx Sent.

## 2020-11-14 NOTE — Telephone Encounter (Signed)
LCSW called [place] [facility] [place]. They have approved pt application through [id number]. The following medications were transferred to that pharmacy w/ the assistance of Gerald Stabs, PharmD: aspirin, clopidogrel, atorvastatin.   The following letter was sent to pt with updates of his pharmacies: Ronny Bacon  Allergies as of 11/14/2020 Review status set to Review Complete on 09/28/2020 No Known Allergies This is your current Medication list. You can find where each of your medications is prescribed and access refills by calling pharmacy in bold red under the medication.    Medications Valid as of: November 14, 2020 - 11:18 AM Generic Name Brand Name Tablet Size Instructions for use  Acetaminophen (Tab) TYLENOL 500 MG Take 2 tablets (1,000 mg total) by mouth every 8 (eight) hours.  Can purchase at any pharmacy including [facility]  Albuterol Sulfate (Aero Soln) ProAir HFA 108 (90 Base) MCG/ACT INHALE 2 PUFFS INTO THE LUNGS EVERY 4 HOURS AS NEEDED FOR WHEEZING OR SHORTNESS OF BREATH (PLAN B).  [facility] Department (Ph: [phone])   Aspirin (Tablet Delayed Response) aspirin EC 81 MG Take 1 tablet (81 mg total) by mouth daily.  Can purchase at any pharmacy including [facility]   Atorvastatin Calcium (Tab) LIPITOR 80 MG Take 1 tablet (80 mg total) by mouth at bedtime.  [place] (ph: [phone])   Bisoprolol Fumarate (Tab) ZEBETA 5 MG Take 0.5 tablets (2.5 mg total) by mouth daily.  [facility] [place] (Ph: [phone])   Calcium Carbonate-Vitamin D (Tab) OSCAL WITH D 250-125 MG-UNIT Take 1 tablet by mouth 2 (two) times daily.  Can purchase at any pharmacy including [facility]   Clopidogrel Bisulfate (Tab) PLAVIX 75 MG Take 1 tablet (75 mg total) by mouth daily with breakfast.  [facility] (ph: [phone])   Dapagliflozin Propanediol (Tab) Farxiga 10 MG TAKE 1  TABLET ('10MG'$  TOTAL) BY MOUTH DAILY.  [facility] Department (Ph: [phone])  Ferrous Sulfate (Tab) ferrous sulfate 325 (65 FE) MG Take 325 mg by mouth daily with breakfast.  [facility] Department (Ph: [phone])   Fluticasone-Salmeterol (Aerosol) Advair HFA 230-21 MCG/ACT INHALE 2 PUFFS INTO THE LUNGS 2 TIMES A DAY.  [facility] Department (Ph: [phone])   metFORMIN HCl (Tab) GLUCOPHAGE 1000 MG Take 1 tablet (1,000 mg total) by mouth 2 (two) times daily with a meal.  [facility] [place] (Ph: [phone])   Multiple Vitamin (Tab) multivitamin with minerals  Take 1 tablet by mouth daily.  Can purchase at any pharmacy including [facility]     Nitroglycerin (SL Tab) NITROSTAT 0.4 MG Place 1 tablet (0.4 mg total) under the tongue every 5 (five) minutes x 3 doses as needed for chest pain.  [facility] [place] (Ph: [phone])   Pantoprazole Sodium (Tablet Delayed Response) PROTONIX 20 MG Take 1 tablet (20 mg total) by mouth daily.  [facility] Department (Ph: [phone])   predniSONE (Tab) DELTASONE 10 MG Take 1 tablet by mouth once daily with breakfast  [place] [facility] [place] (Ph: [phone])   Valsartan (Tab) DIOVAN 40 MG Take 1 tablet (40 mg total) by mouth daily.  [facility] (Ph: [phone])

## 2020-11-16 ENCOUNTER — Other Ambulatory Visit: Payer: Self-pay

## 2020-11-16 ENCOUNTER — Encounter: Payer: Self-pay | Admitting: Cardiovascular Disease

## 2020-11-16 ENCOUNTER — Ambulatory Visit (INDEPENDENT_AMBULATORY_CARE_PROVIDER_SITE_OTHER): Payer: Self-pay | Admitting: Cardiovascular Disease

## 2020-11-16 VITALS — BP 110/60 | HR 59 | Ht 67.0 in | Wt 145.0 lb

## 2020-11-16 DIAGNOSIS — I472 Ventricular tachycardia, unspecified: Secondary | ICD-10-CM

## 2020-11-16 DIAGNOSIS — I10 Essential (primary) hypertension: Secondary | ICD-10-CM

## 2020-11-16 DIAGNOSIS — I5020 Unspecified systolic (congestive) heart failure: Secondary | ICD-10-CM

## 2020-11-16 DIAGNOSIS — I214 Non-ST elevation (NSTEMI) myocardial infarction: Secondary | ICD-10-CM

## 2020-11-16 DIAGNOSIS — I2510 Atherosclerotic heart disease of native coronary artery without angina pectoris: Secondary | ICD-10-CM

## 2020-11-16 DIAGNOSIS — IMO0002 Reserved for concepts with insufficient information to code with codable children: Secondary | ICD-10-CM

## 2020-11-16 DIAGNOSIS — E1165 Type 2 diabetes mellitus with hyperglycemia: Secondary | ICD-10-CM

## 2020-11-16 MED ORDER — CLOPIDOGREL BISULFATE 75 MG PO TABS
75.0000 mg | ORAL_TABLET | Freq: Every day | ORAL | 3 refills | Status: DC
  Filled 2020-11-16: qty 30, 30d supply, fill #0

## 2020-11-16 MED ORDER — VALSARTAN 40 MG PO TABS
40.0000 mg | ORAL_TABLET | Freq: Every day | ORAL | 3 refills | Status: DC
  Filled 2020-11-16: qty 90, 90d supply, fill #0

## 2020-11-16 MED ORDER — VALSARTAN 40 MG PO TABS
40.0000 mg | ORAL_TABLET | Freq: Every day | ORAL | 3 refills | Status: DC
  Filled 2020-11-16 – 2021-01-29 (×2): qty 30, 30d supply, fill #0
  Filled 2021-02-28: qty 30, 30d supply, fill #1
  Filled 2021-03-28: qty 30, 30d supply, fill #2
  Filled 2021-04-26: qty 30, 30d supply, fill #3
  Filled 2021-04-26: qty 30, 30d supply, fill #0
  Filled 2021-05-29: qty 30, 30d supply, fill #1
  Filled 2021-07-02: qty 30, 30d supply, fill #2
  Filled 2021-08-02: qty 30, 30d supply, fill #3
  Filled 2021-09-02: qty 30, 30d supply, fill #4
  Filled 2021-10-28: qty 30, 30d supply, fill #5

## 2020-11-16 MED ORDER — BISOPROLOL FUMARATE 5 MG PO TABS
2.5000 mg | ORAL_TABLET | Freq: Every day | ORAL | 3 refills | Status: DC
  Filled 2020-11-16: qty 45, 90d supply, fill #0

## 2020-11-16 MED ORDER — BISOPROLOL FUMARATE 5 MG PO TABS
2.5000 mg | ORAL_TABLET | Freq: Every day | ORAL | 3 refills | Status: DC
  Filled 2020-11-16: qty 15, 30d supply, fill #0
  Filled 2020-12-25: qty 15, 30d supply, fill #1
  Filled 2021-01-29: qty 15, 30d supply, fill #2
  Filled 2021-02-28: qty 15, 30d supply, fill #3
  Filled 2021-03-28: qty 15, 30d supply, fill #4
  Filled 2021-04-26: qty 15, 30d supply, fill #0
  Filled 2021-04-26: qty 15, 30d supply, fill #5
  Filled 2021-05-29: qty 15, 30d supply, fill #1
  Filled 2021-07-02: qty 15, 30d supply, fill #2
  Filled 2021-08-02: qty 15, 30d supply, fill #3
  Filled 2021-09-02: qty 15, 30d supply, fill #4
  Filled 2021-10-28: qty 15, 30d supply, fill #5

## 2020-11-16 MED ORDER — ATORVASTATIN CALCIUM 80 MG PO TABS
80.0000 mg | ORAL_TABLET | Freq: Every day | ORAL | 3 refills | Status: DC
  Filled 2020-11-16: qty 30, 30d supply, fill #0

## 2020-11-16 MED ORDER — CLOPIDOGREL BISULFATE 75 MG PO TABS
75.0000 mg | ORAL_TABLET | Freq: Every day | ORAL | 3 refills | Status: DC
  Filled 2020-11-16: qty 90, 90d supply, fill #0

## 2020-11-16 MED ORDER — ATORVASTATIN CALCIUM 80 MG PO TABS
80.0000 mg | ORAL_TABLET | Freq: Every day | ORAL | 3 refills | Status: DC
  Filled 2020-11-16: qty 90, 90d supply, fill #0

## 2020-11-16 NOTE — Patient Instructions (Signed)
Medication Instructions:  Your physician recommends that you continue on your current medications as directed. Please refer to the Current Medication list given to you today. We refilled: atorvastatin   Bisoprolol (Zebeta)   Plavix   Valsartan  *If you need a refill on your cardiac medications before your next appointment, please call your pharmacy*   Lab Work: NONE If you have labs (blood work) drawn today and your tests are completely normal, you will receive your results only by: [place] (if you have MyChart) OR A paper copy in the mail If you have any lab test that is abnormal or we need to change your treatment, we will call you to review the results.   Testing/Procedures: NONE   Follow-Up: At [facility], you and your health needs are our priority.  As part of our continuing mission to provide you with exceptional heart care, we have created designated Provider Care Teams.  These Care Teams include your primary Cardiologist (physician) and Advanced Practice Providers (APPs -  Physician Assistants and Nurse Practitioners) who all work together to provide you with the care you need, when you need it.  We recommend signing up for the patient portal called "MyChart".  Sign up information is provided on this After Visit Summary.  MyChart is used to connect with patients for Virtual Visits (Telemedicine).  Patients are able to view lab/test results, encounter notes, upcoming appointments, etc.  Non-urgent messages can be sent to your provider as well.   To learn more about what you can do with MyChart, go to [email or web address].    Your next appointment:   1 year(s)  The format for your next appointment:   In Person  Provider:   You may see Lauree Chandler, MD or one of the following Advanced Practice Providers on your designated Care Team:   Melina Copa, PA-C Ermalinda Barrios, PA-C

## 2020-11-16 NOTE — Progress Notes (Signed)
Chief Complaint  Patient presents with   Follow-up    CAD    History of Present Illness: 60 yo male with history of CAD, VT, GERD, DM, asthma, hyperlipidemia here today for cardiac follow up. In 2008 he had an anterior MI treated with a drug eluting stent in the LAD. His ejection fraction was 45% at that time. Myoview 2010 showed no ischemia and showed an ejection fraction of 40%. Echo January 2016 with LVEF=40%. Apical hypokinesis. No LV thrombus seen. Echo March 2021 with LVEF=35-40%. He has been on Entresto. Admitted to [facility] June 2022 with sustained VT in the setting of a NSTEMI. The severe RCA stenosis was treated with a drug eluting stent. The LAD stent was patent. Echo 09/21/20 with LVEF=30-35%. No valve disease. He was seen by EP and discharged with a [place]. Plans for ICD if LVEF below 35$ after 3 months or for EP study if LVEF over 35%. He was not discharged on amiodarone. He was discharged on ASA, Plavix, bisoprolol. Entresto stopped due to hypotension.   He is here today for follow up. The patient denies any chest pain, dyspnea, palpitations, lower extremity edema, orthopnea, PND, dizziness, near syncope or syncope. He is wearing his Lifevest.   Primary Care Physician: Carlena Bjornstad, MD   Past Medical History:  Diagnosis Date   Asthma    CAD (coronary artery disease)    a. CAD s/p anterior MI in 2008 treated with DES to the LAD.   Cataract 2018   both eyes   Cellulitis of leg, right 10/2014   COPD (chronic obstructive pulmonary disease) ([place])    Dental caries    Diabetes mellitus    TYPE 2   GERD (gastroesophageal reflux disease)    Hyperlipidemia    Hypertension 2017   Ischemic cardiomyopathy    Leukocytosis    noted on labs   Microcytic anemia    Myocardial infarction [facility]) 2008   Reactive airway disease    Thyroid nodule    biopsy negative 2006    Past Surgical History:  Procedure Laterality Date   CORONARY STENT INTERVENTION N/A 09/20/2020   Procedure:  CORONARY STENT INTERVENTION;  Surgeon: Lorretta Harp, MD;  Location: [facility];  Service: Cardiovascular;  Laterality: N/A;   CORONARY STENT PLACEMENT     ESOPHAGOGASTRODUODENOSCOPY N/A 03/29/2017   Procedure: ESOPHAGOGASTRODUODENOSCOPY (EGD);  Surgeon: Milus Banister, MD;  Location: [facility];  Service: Endoscopy;  Laterality: N/A;   ESOPHAGOGASTRODUODENOSCOPY N/A 03/31/2018   Procedure: ESOPHAGOGASTRODUODENOSCOPY (EGD);  Surgeon: Doran Stabler, MD;  Location: [facility];  Service: Gastroenterology;  Laterality: N/A;   FOREIGN BODY REMOVAL N/A 03/31/2018   Procedure: FOREIGN BODY REMOVAL;  Surgeon: Doran Stabler, MD;  Location: [facility];  Service: Gastroenterology;  Laterality: N/A;   LEFT HEART CATH AND CORONARY ANGIOGRAPHY N/A 09/20/2020   Procedure: LEFT HEART CATH AND CORONARY ANGIOGRAPHY;  Surgeon: Lorretta Harp, MD;  Location: [facility];  Service: Cardiovascular;  Laterality: N/A;   solitary pulm and thryoid nodules      Current Outpatient Medications  Medication Sig Dispense Refill   ADVAIR HFA 230-21 MCG/ACT inhaler INHALE 2 PUFFS INTO THE LUNGS 2 TIMES A DAY. 1 each 2   aspirin EC 81 MG tablet Take 1 tablet (81 mg total) by mouth daily. 30 tablet 11   calcium-vitamin D (OSCAL WITH D) 250-125 MG-UNIT tablet Take 1 tablet by mouth 2 (two) times daily.     FARXIGA 10  MG TABS tablet TAKE 1 TABLET ('10MG'$  TOTAL) BY MOUTH DAILY. 90 tablet 0   ferrous sulfate 325 (65 FE) MG tablet Take 325 mg by mouth daily with breakfast.     metFORMIN (GLUCOPHAGE) 1000 MG tablet Take 1 tablet (1,000 mg total) by mouth 2 (two) times daily with a meal. 180 tablet 1   Multiple Vitamin (MULTIVITAMIN WITH MINERALS) TABS tablet Take 1 tablet by mouth daily.     nitroGLYCERIN (NITROSTAT) 0.4 MG SL tablet Place 1 tablet (0.4 mg total) under the tongue every 5 (five) minutes x 3 doses as needed for chest pain. 25 tablet 2   pantoprazole (PROTONIX) 20 MG tablet Take 1  tablet (20 mg total) by mouth daily. 30 tablet 0   predniSONE (DELTASONE) 10 MG tablet Take 1 tablet by mouth once daily with breakfast 30 tablet 0   PROAIR HFA 108 (90 Base) MCG/ACT inhaler INHALE 2 PUFFS INTO THE LUNGS EVERY 4 HOURS AS NEEDED FOR WHEEZING OR SHORTNESS OF BREATH (PLAN B). 8 each 5   acetaminophen (TYLENOL) 500 MG tablet Take 2 tablets (1,000 mg total) by mouth every 8 (eight) hours. (Patient not taking: Reported on 11/16/2020) 30 tablet 1   atorvastatin (LIPITOR) 80 MG tablet Take 1 tablet (80 mg total) by mouth at bedtime. 90 tablet 3   bisoprolol (ZEBETA) 5 MG tablet Take 0.5 tablets (2.5 mg total) by mouth daily. 45 tablet 3   clopidogrel (PLAVIX) 75 MG tablet Take 1 tablet (75 mg total) by mouth daily with breakfast. 90 tablet 3   valsartan (DIOVAN) 40 MG tablet Take 1 tablet (40 mg total) by mouth daily. 90 tablet 3   No current facility-administered medications for this visit.    No Known Allergies  Social History   Socioeconomic History   Marital status: Married    Spouse name: Not on file   Number of children: 3   Years of education: Not on file   Highest education level: Not on file  Occupational History   Not on file  Tobacco Use   Smoking status: Former    Packs/day: 0.50    Years: 22.00    Pack years: 11.00    Types: Cigarettes    Quit date: 04/21/2006    Years since quitting: 14.5   Smokeless tobacco: Never   Tobacco comments:    quit 10 years ago  Vaping Use   Vaping Use: Never used  Substance and Sexual Activity   Alcohol use: No    Alcohol/week: 0.0 standard drinks   Drug use: No   Sexual activity: Yes    Birth control/protection: Condom  Other Topics Concern   Not on file  Social History Narrative   Lives with wife; recently moved to [place] from [place] due to health problems. Has no means of affording meds currently + tobacco 1/2ppd x 54yr, no ETOH no drugs.   Social Determinants of Health   Financial Resource Strain: High Risk    Difficulty of Paying Living Expenses: Hard  Food Insecurity: No Food Insecurity   Worried About RCharity fundraiserin the Last Year: Never true   Ran Out of Food in the Last Year: Never true  Transportation Needs: No Transportation Needs   Lack of Transportation (Medical): No   Lack of Transportation (Non-Medical): No  Physical Activity: Not on file  Stress: Not on file  Social Connections: Not on file  Intimate Partner Violence: Not on file    Family History  Problem Relation Age  of Onset   Diabetes Mother    Heart attack Brother 48   Colon cancer Neg Hx    Esophageal cancer Neg Hx    Rectal cancer Neg Hx    Stomach cancer Neg Hx     Review of Systems:  As stated in the HPI and otherwise negative.   BP 110/60   Pulse (!) 59   Ht '5\' 7"'$  (1.702 m)   Wt 145 lb (65.8 kg)   SpO2 95%   BMI 22.71 kg/m   Physical Examination: General: Well developed, well nourished, NAD  HEENT: OP clear, mucus membranes moist  SKIN: warm, dry. No rashes. Neuro: No focal deficits  Musculoskeletal: Muscle strength 5/5 all ext  Psychiatric: Mood and affect normal  Neck: No JVD, no carotid bruits, no thyromegaly, no lymphadenopathy.  Lungs:Clear bilaterally, no wheezes, rhonci, crackles Cardiovascular: Regular rate and rhythm. No murmurs, gallops or rubs. Abdomen:Soft. Bowel sounds present. Non-tender.  Extremities: No lower extremity edema. Pulses are 2 + in the bilateral DP/PT.  Echo 09/21/20:  1. Left ventricular ejection fraction, by estimation, is 30 to 35%. The  left ventricle has moderately decreased function. The left ventricle  demonstrates regional wall motion abnormalities. The mid-to-apical  anteroseptal, mid-to-apical inferoseptal,  mid-to-apical anterior, apical inferior, and apical lateral LV segments.  Apex appears aneurysmal. No LV thrombus visualized. There is mild  asymmetric left ventricular hypertrophy of the basal-septal segment. Left  ventricular diastolic parameters  are  consistent with Grade I diastolic dysfunction (impaired relaxation).  Elevated left atrial pressure.   2. Right ventricular systolic function is normal. The right ventricular  size is normal.   3. The mitral valve is normal in structure. Trivial mitral valve  regurgitation. No evidence of mitral stenosis.   4. The aortic valve is tricuspid. There is mild calcification of the  aortic valve. There is mild thickening of the aortic valve. Aortic valve  regurgitation is not visualized. Mild aortic valve sclerosis is present,  with no evidence of aortic valve  stenosis.   5. The inferior vena cava is normal in size with greater than 50%  respiratory variability, suggesting right atrial pressure of 3 mmHg.   EKG:  EKG is not  ordered today. The ekg ordered today demonstrates   Recent Labs: 09/21/2020: Hemoglobin 13.0; Platelets 203 09/22/2020: Magnesium 2.1 11/09/2020: ALT 31; BUN 21; Creatinine, Ser 1.09; Potassium 4.3; Sodium 141   Lipid Panel    Component Value Date/Time   CHOL 101 11/09/2020 0906   TRIG 129 11/09/2020 0906   HDL 59 11/09/2020 0906   CHOLHDL 1.7 11/09/2020 0906   CHOLHDL 3.3 09/21/2020 0300   VLDL 20 09/21/2020 0300   LDLCALC 20 11/09/2020 0906   LDLDIRECT 91 08/14/2017 1643   LDLDIRECT 98 07/28/2012 1701     Wt Readings from Last 3 Encounters:  11/16/20 145 lb (65.8 kg)  10/18/20 147 lb (66.7 kg)  09/28/20 148 lb (67.1 kg)     Other studies Reviewed: Additional studies/ records that were reviewed today include: . Review of the above records demonstrates:    Assessment and Plan:   1. CAD without angina: Recent NSTEMI. RCA stenting and patency of old LAD stent. Medical therapy for branch vessel disease. Will plan to continue DAPT with ASA and Plavix for one year. Continue ASA, Plavix, bisoprolol and statin.      2. Cardiomyopathy, ischemic:  LVEF 30-35% by echo June 2022. Continue beta blocker. Entresto stopped due to hypotension. He is now tolerating  valsartan.  Plan for repeat echo 3 months post revascularization. This is planned for 12/28/20.  See below regarding ICD  3. HTN: BP is well controlled. Continue current therapy  4. Hyperlipidemia: LDL at goal. Continue statin.   5. VT: This was in the setting of a NSTEMI. He has been seen by EP. He is wearing a Lifevest. Plan is to repeat an echo in September 2022 and if LVEF is below 35%, he will need an ICD. If his LVEF is over 35% will need an EP study. He will see Dr. Lovena Le in the EP clinic at the beginning of October 2022.   Current medicines are reviewed at length with the patient today.  The patient does not have concerns regarding medicines.  The following changes have been made:  no change  Labs/ tests ordered today include:   No orders of the defined types were placed in this encounter.   Disposition:   F/U with me in 12  months  Signed, Lauree Chandler, MD 11/16/2020 4:07 PM    [facility] [place], [place], [place]  [address] Phone: [phone]; Fax: [phone]

## 2020-11-19 ENCOUNTER — Other Ambulatory Visit: Payer: Self-pay

## 2020-11-21 ENCOUNTER — Ambulatory Visit: Payer: No Typology Code available for payment source | Admitting: Physician Assistant

## 2020-11-21 ENCOUNTER — Ambulatory Visit: Payer: No Typology Code available for payment source | Admitting: Cardiovascular Disease

## 2020-12-10 ENCOUNTER — Telehealth (HOSPITAL_COMMUNITY): Payer: Self-pay | Admitting: *Deleted

## 2020-12-10 NOTE — Telephone Encounter (Signed)
Reviewed pts health history in preparation for cardiac rehab orientation.  Yves Dill CES, ACSM 1:38 PM 12/10/2020

## 2020-12-11 ENCOUNTER — Other Ambulatory Visit: Payer: Self-pay | Admitting: Family Medicine

## 2020-12-11 ENCOUNTER — Encounter (HOSPITAL_COMMUNITY)
Admission: RE | Admit: 2020-12-11 | Discharge: 2020-12-11 | Disposition: A | Discharge status: Home / Self Care | Payer: MEDICAID | Source: Ambulatory Visit | Attending: Cardiovascular Disease | Admitting: Cardiovascular Disease

## 2020-12-11 ENCOUNTER — Other Ambulatory Visit: Payer: Self-pay

## 2020-12-11 VITALS — BP 108/70 | HR 72 | Ht 68.0 in | Wt 146.6 lb

## 2020-12-11 DIAGNOSIS — Z955 Presence of coronary angioplasty implant and graft: Secondary | ICD-10-CM

## 2020-12-11 DIAGNOSIS — E1165 Type 2 diabetes mellitus with hyperglycemia: Secondary | ICD-10-CM

## 2020-12-11 DIAGNOSIS — IMO0002 Reserved for concepts with insufficient information to code with codable children: Secondary | ICD-10-CM

## 2020-12-11 NOTE — Progress Notes (Addendum)
Cardiac Rehab Medication Review by a Nurse  Does the patient  feel that his/her medications are working for him/her?  YES   Has the patient been experiencing any side effects to the medications prescribed?   NO  Does the patient measure his/her own blood pressure or blood glucose at home?  YES   Does the patient have any problems obtaining medications due to transportation or finances?   YES   Understanding of regimen: good Understanding of indications: good Potential of compliance: good    Nurse comments: Mr Cocker is taking his medications as prescribed. Mr Rahl is not currently working and says he has all of his medications. Mr Devincentis reports he has financial difficulties due to limited income. Patient was given a hand out with the phone number for the medication assistance program. Mr Davidoff has a BP monitor and a CBG meter that she uses at home.    Harrell Gave RN BSN 12/11/2020 2:55 PM

## 2020-12-11 NOTE — Progress Notes (Signed)
Mr Economides was unable to download the [facility] App as he did not remember his pass word for his iphone. Will follow up with the patient in the next few days regarding down loading the APP. Patient was given a down load information sheet.Barnet Pall, RN,BSN 12/11/2020 3:51 PM

## 2020-12-11 NOTE — Progress Notes (Signed)
Pt is interested in participating in Virtual Cardiac and Pulmonary Rehab. Pt advised that Virtual Cardiac and Pulmonary Rehab is provided at no cost to the patient.  Checklist:  Pt has smart device  ie smartphone and/or ipad for downloading an app  Yes Reliable internet/wifi service    Yes Understands how to use their smartphone and navigate within an app.  Yes   Pt verbalized understanding and is in agreement.             Confirm Consent - In the setting of the current Covid19 crisis, you are scheduled for a phone visit with your Cardiac or Pulmonary team member.  Just as we do with many in-gym visits, in order for you to participate in this visit, we must obtain consent.  If you'd like, I can send this to your mychart (if signed up) or email for you to review.  Otherwise, I can obtain your verbal consent now.  By agreeing to a telephone visit, we'd like you to understand that the technology does not allow for your Cardiac or Pulmonary Rehab team member to perform a physical assessment, and thus may limit their ability to fully assess your ability to perform exercise programs. If your provider identifies any concerns that need to be evaluated in person, we will make arrangements to do so.  Finally, though the technology is pretty good, we cannot assure that it will always work on either your or our end and we cannot ensure that we have a secure connection.  Cardiac and Pulmonary Rehab Telehealth visits and "At Home" cardiac and pulmonary rehab are provided at no cost to you.        Are you willing to proceed?"        STAFF: Did the patient verbally acknowledge consent to telehealth visit? Document YES/NO here: Yes     Barnet Pall RN BSN   Cardiac and Pulmonary Rehab Staff        Date 12/11/20    @ Time 1348

## 2020-12-11 NOTE — Progress Notes (Signed)
Patient will be participating in the virtual cardiac rehab program via the [facility]. Reviewed home exercise guidelines with patient including endpoints, temperature precautions, target heart rate and rate of perceived exertion. Patient is currently walking  10-20 minutes most days of the week as his mode of home exercise. Discussed increasing duration to achieve 30 minutes most days/week, and patient is amenable to this. Patient will as 1-2 minutes as tolerated until reaching 30 minutes walking. Patient doesn't have hand weights but will use cans or bottles for his hand weight exercises 1-2 days/week. Patient voices understanding of instructions given.  Sol Passer, MS, ACSM CEP

## 2020-12-11 NOTE — Progress Notes (Signed)
Cardiac Individual Treatment Plan  Patient Details  Name: [name] MRN: [id number] Date of Birth: 1960-07-09 Referring Provider:   Flowsheet Row CARDIAC REHAB PHASE II ORIENTATION from 12/11/2020 in [place]  Referring Provider Burnell Blanks, MD       Initial Encounter Date:  [place] from 12/11/2020 in [place]  Date 12/11/20       Visit Diagnosis: 09/20/20 DES RCA  Patient's Home Medications on Admission:  Current Outpatient Medications:    ADVAIR HFA 230-21 MCG/ACT inhaler, INHALE 2 PUFFS INTO THE LUNGS 2 TIMES A DAY., Disp: 1 each, Rfl: 2   aspirin EC 81 MG tablet, Take 1 tablet (81 mg total) by mouth daily., Disp: 30 tablet, Rfl: 11   atorvastatin (LIPITOR) 80 MG tablet, Take 1 tablet (80 mg total) by mouth at bedtime., Disp: 90 tablet, Rfl: 3   bisoprolol (ZEBETA) 5 MG tablet, Take 0.5 tablets (2.5 mg total) by mouth daily., Disp: 45 tablet, Rfl: 3   calcium-vitamin D (OSCAL WITH D) 250-125 MG-UNIT tablet, Take 1 tablet by mouth 2 (two) times daily., Disp: , Rfl:    clopidogrel (PLAVIX) 75 MG tablet, Take 1 tablet (75 mg total) by mouth daily with breakfast., Disp: 90 tablet, Rfl: 3   FARXIGA 10 MG TABS tablet, TAKE 1 TABLET ('10MG'$  TOTAL) BY MOUTH DAILY., Disp: 90 tablet, Rfl: 0   ferrous sulfate 325 (65 FE) MG tablet, Take 325 mg by mouth daily with breakfast., Disp: , Rfl:    metFORMIN (GLUCOPHAGE) 1000 MG tablet, Take 1 tablet (1,000 mg total) by mouth 2 (two) times daily with a meal., Disp: 180 tablet, Rfl: 1   Multiple Vitamin (MULTIVITAMIN WITH MINERALS) TABS tablet, Take 1 tablet by mouth daily., Disp: , Rfl:    nitroGLYCERIN (NITROSTAT) 0.4 MG SL tablet, Place 1 tablet (0.4 mg total) under the tongue every 5 (five) minutes x 3 doses as needed for chest pain., Disp: 25 tablet, Rfl: 2   pantoprazole (PROTONIX) 20 MG tablet, Take 1 tablet (20 mg total) by  mouth daily., Disp: 30 tablet, Rfl: 0   predniSONE (DELTASONE) 10 MG tablet, Take 1 tablet by mouth once daily with breakfast, Disp: 30 tablet, Rfl: 0   PROAIR HFA 108 (90 Base) MCG/ACT inhaler, INHALE 2 PUFFS INTO THE LUNGS EVERY 4 HOURS AS NEEDED FOR WHEEZING OR SHORTNESS OF BREATH (PLAN B)., Disp: 8 each, Rfl: 5   valsartan (DIOVAN) 40 MG tablet, Take 1 tablet (40 mg total) by mouth daily., Disp: 90 tablet, Rfl: 3   acetaminophen (TYLENOL) 500 MG tablet, Take 2 tablets (1,000 mg total) by mouth every 8 (eight) hours. (Patient not taking: Reported on 11/16/2020), Disp: 30 tablet, Rfl: 1  Past Medical History: Past Medical History:  Diagnosis Date   Asthma    CAD (coronary artery disease)    a. CAD s/p anterior MI in 2008 treated with DES to the LAD.   Cataract 2018   both eyes   Cellulitis of leg, right 10/2014   COPD (chronic obstructive pulmonary disease) ([place])    Dental caries    Diabetes mellitus    TYPE 2   GERD (gastroesophageal reflux disease)    Hyperlipidemia    Hypertension 2017   Ischemic cardiomyopathy    Leukocytosis    noted on labs   Microcytic anemia    Myocardial infarction [facility]) 2008   Reactive airway disease    Thyroid nodule  biopsy negative 2006    Tobacco Use: Social History   Tobacco Use  Smoking Status Former   Packs/day: 0.50   Years: 22.00   Pack years: 11.00   Types: Cigarettes   Quit date: 04/21/2006   Years since quitting: 14.6  Smokeless Tobacco Never  Tobacco Comments   quit 10 years ago    Labs: Recent Review Flowsheet Data     Labs for ITP Cardiac and Pulmonary Rehab Latest Ref Rng & Units 08/07/2020 09/20/2020 09/21/2020 09/22/2020 11/09/2020   Cholestrol 100 - 199 mg/dL - - 130 - 101   LDLCALC 0 - 99 mg/dL - - 70 - 20   LDLDIRECT 0 - 99 mg/dL - - - - -   HDL >39 mg/dL - - 40(L) - 59   Trlycerides 0 - 149 mg/dL - - 98 - 129   Hemoglobin A1c 4.8 - 5.6 % 7.5(A) - - 7.4(H) -   TCO2 22 - 32 mmol/L - 23 - - -       Capillary Blood  Glucose: Lab Results  Component Value Date   GLUCAP 116 (H) 09/21/2020   GLUCAP 179 (H) 09/21/2020   GLUCAP 130 (H) 09/21/2020   GLUCAP 119 (H) 09/21/2020   GLUCAP 185 (H) 09/20/2020     Exercise Target Goals: Exercise Program Goal: Individual exercise prescription set using results from initial 6 min walk test and THRR while considering  patient's activity barriers and safety.   Exercise Prescription Goal: Initial exercise prescription builds to 30-45 minutes a day of aerobic activity, 2-3 days per week.  Home exercise guidelines will be given to patient during program as part of exercise prescription that the participant will acknowledge.  Activity Barriers & Risk Stratification:  Activity Barriers & Cardiac Risk Stratification - 12/11/20 1334       Activity Barriers & Cardiac Risk Stratification   Activity Barriers None    Cardiac Risk Stratification High             6 Minute Walk:  6 Minute Walk     Row Name 12/11/20 1402         6 Minute Walk   Phase Initial     Distance 1475 feet     Walk Time 6 minutes     # of Rest Breaks 0     MPH 2.79     METS 4.13     RPE 14     Perceived Dyspnea  0     VO2 Peak 14.45     Symptoms No     Resting HR 72 bpm     Resting BP 108/70     Resting Oxygen Saturation  96 %     Exercise Oxygen Saturation  during 6 min walk 97 %     Max Ex. HR 102 bpm     Max Ex. BP 142/72     2 Minute Post BP 106/60              Oxygen Initial Assessment:   Oxygen Re-Evaluation:   Oxygen Discharge (Final Oxygen Re-Evaluation):   Initial Exercise Prescription:  Initial Exercise Prescription - 12/11/20 1400       Date of Initial Exercise RX and Referring Provider   Date 12/11/20    Referring Provider Burnell Blanks, MD    Expected Discharge Date 02/08/21      Track   Minutes 30      Prescription Details   Frequency (times per week) 5-7  Duration Progress to 30 minutes of continuous aerobic without  signs/symptoms of physical distress      Intensity   THRR 40-80% of Max Heartrate 64-127    Ratings of Perceived Exertion 11-13    Perceived Dyspnea 0-4      Progression   Progression Continue to progress workloads to maintain intensity without signs/symptoms of physical distress.      Resistance Training   Training Prescription Yes    Reps 10-15             Perform Capillary Blood Glucose checks as needed.  Exercise Prescription Changes:   Exercise Comments:   Exercise Comments     Row Name 12/11/20 1334           Exercise Comments Reviewed exercise guidelines with patient.                Exercise Goals and Review:   Exercise Goals     Row Name 12/11/20 1317             Exercise Goals   Increase Physical Activity Yes       Intervention Provide advice, education, support and counseling about physical activity/exercise needs.;Develop an individualized exercise prescription for aerobic and resistive training based on initial evaluation findings, risk stratification, comorbidities and participant's personal goals.       Expected Outcomes Short Term: Attend rehab on a regular basis to increase amount of physical activity.;Long Term: Exercising regularly at least 3-5 days a week.;Long Term: Add in home exercise to make exercise part of routine and to increase amount of physical activity.       Increase Strength and Stamina Yes       Intervention Provide advice, education, support and counseling about physical activity/exercise needs.;Develop an individualized exercise prescription for aerobic and resistive training based on initial evaluation findings, risk stratification, comorbidities and participant's personal goals.       Expected Outcomes Short Term: Increase workloads from initial exercise prescription for resistance, speed, and METs.;Short Term: Perform resistance training exercises routinely during rehab and add in resistance training at home;Long Term:  Improve cardiorespiratory fitness, muscular endurance and strength as measured by increased METs and functional capacity (6MWT)       Able to understand and use rate of perceived exertion (RPE) scale Yes       Intervention Provide education and explanation on how to use RPE scale       Expected Outcomes Short Term: Able to use RPE daily in rehab to express subjective intensity level;Long Term:  Able to use RPE to guide intensity level when exercising independently       Knowledge and understanding of Target Heart Rate Range (THRR) Yes       Intervention Provide education and explanation of THRR including how the numbers were predicted and where they are located for reference       Expected Outcomes Short Term: Able to state/look up THRR;Long Term: Able to use THRR to govern intensity when exercising independently;Short Term: Able to use daily as guideline for intensity in rehab       Able to check pulse independently Yes       Intervention Provide education and demonstration on how to check pulse in carotid and radial arteries.;Review the importance of being able to check your own pulse for safety during independent exercise       Expected Outcomes Short Term: Able to explain why pulse checking is important during independent exercise;Long Term: Able to check pulse  independently and accurately       Understanding of Exercise Prescription Yes       Intervention Provide education, explanation, and written materials on patient's individual exercise prescription       Expected Outcomes Short Term: Able to explain program exercise prescription;Long Term: Able to explain home exercise prescription to exercise independently                Exercise Goals Re-Evaluation :   Discharge Exercise Prescription (Final Exercise Prescription Changes):   Nutrition:  Target Goals: Understanding of nutrition guidelines, daily intake of sodium '1500mg'$ , cholesterol '200mg'$ , calories 30% from fat and 7% or less  from saturated fats, daily to have 5 or more servings of fruits and vegetables.  Biometrics:  Pre Biometrics - 12/11/20 1316       Pre Biometrics   Waist Circumference 35.25 inches    Hip Circumference 35 inches    Waist to Hip Ratio 1.01 %    Triceps Skinfold 14 mm    % Body Fat 22.9 %    Grip Strength 21 kg    Flexibility 14 in    Single Leg Stand 15.12 seconds              Nutrition Therapy Plan and Nutrition Goals:   Nutrition Assessments:  MEDIFICTS Score Key: ?70 Need to make dietary changes  40-70 Heart Healthy Diet ? 40 Therapeutic Level Cholesterol Diet    Picture Your Plate Scores: [id number] Unhealthy dietary pattern with much room for improvement. 41-50 Dietary pattern unlikely to meet recommendations for good health and room for improvement. 51-60 More healthful dietary pattern, with some room for improvement.  >60 Healthy dietary pattern, although there may be some specific behaviors that could be improved.    Nutrition Goals Re-Evaluation:   Nutrition Goals Re-Evaluation:   Nutrition Goals Discharge (Final Nutrition Goals Re-Evaluation):   Psychosocial: Target Goals: Acknowledge presence or absence of significant depression and/or stress, maximize coping skills, provide positive support system. Participant is able to verbalize types and ability to use techniques and skills needed for reducing stress and depression.  Initial Review & Psychosocial Screening:  Initial Psych Review & Screening - 12/11/20 1558       Initial Review   Current issues with Current Stress Concerns    Source of Stress Concerns Financial;Chronic Illness    Comments Mr Caulk voices having financial concerns due to lack of income and CAD.      Family Dynamics   Good Support System? Yes   Kellam has his wife and daughter for support     Barriers   Psychosocial barriers to participate in program The patient should benefit from training in stress management and relaxation.       Screening Interventions   Interventions Encouraged to exercise;To provide support and resources with identified psychosocial needs    Expected Outcomes Long Term Goal: Stressors or current issues are controlled or eliminated.             Quality of Life Scores:  Quality of Life - 12/11/20 1501       Quality of Life   Select Quality of Life      Quality of Life Scores   Health/Function Pre 21 %    Socioeconomic Pre 21.42 %    Psych/Spiritual Pre 21.17 %    Family Pre 20.2 %    GLOBAL Pre 20.98 %            Scores of 19 and below usually  indicate a poorer quality of life in these areas.  A difference of  2-3 points is a clinically meaningful difference.  A difference of 2-3 points in the total score of the Quality of Life Index has been associated with significant improvement in overall quality of life, self-image, physical symptoms, and general health in studies assessing change in quality of life.  PHQ-9: Recent Review Flowsheet Data     Depression screen [facility] 2/9 12/11/2020 08/07/2020 04/11/2020 03/30/2020 03/26/2020   Decreased Interest 0 1 0 0 0   Down, Depressed, Hopeless 0 0 0 0 0   PHQ - 2 Score 0 1 0 0 0   Altered sleeping - 0 0 0 1   Tired, decreased energy - 1 0 1 0   Change in appetite - 0 0 0 0   Feeling bad or failure about yourself  - 0 0 0 0   Trouble concentrating - 0 0 0 0   Moving slowly or fidgety/restless - 1 0 0 0   Suicidal thoughts - 0 0 0 0   PHQ-9 Score - 3 0 1 1   Difficult doing work/chores - - Not difficult at all - -      Interpretation of Total Score  Total Score Depression Severity:  1-4 = Minimal depression, 5-9 = Mild depression, 10-14 = Moderate depression, 15-19 = Moderately severe depression, 20-27 = Severe depression   Psychosocial Evaluation and Intervention:   Psychosocial Re-Evaluation:   Psychosocial Discharge (Final Psychosocial Re-Evaluation):   Vocational Rehabilitation: Provide vocational rehab assistance to  qualifying candidates.   Vocational Rehab Evaluation & Intervention:  Vocational Rehab - 12/11/20 1602       Initial Vocational Rehab Evaluation & Intervention   Assessment shows need for Vocational Rehabilitation Yes    Vocational Rehab Packet given to patient 12/11/20      Vocational Rehab Re-Evaulation   Comments Mr Ackroyd says he is not currently working and has applied for disability. Patient given vocational rehab packet to review.             Education: Education Goals: Education classes will be provided on a weekly basis, covering required topics. Participant will state understanding/return demonstration of topics presented.  Learning Barriers/Preferences:  Learning Barriers/Preferences - 12/11/20 1601       Learning Barriers/Preferences   Learning Barriers Hearing   report some hearing loss   Learning Preferences Pictoral;Video             Education Topics: Count Your Pulse:  -Group instruction provided by verbal instruction, demonstration, patient participation and written materials to support subject.  Instructors address importance of being able to find your pulse and how to count your pulse when at home without a heart monitor.  Patients get hands on experience counting their pulse with staff help and individually.   Heart Attack, Angina, and Risk Factor Modification:  -Group instruction provided by verbal instruction, video, and written materials to support subject.  Instructors address signs and symptoms of angina and heart attacks.    Also discuss risk factors for heart disease and how to make changes to improve heart health risk factors.   Functional Fitness:  -Group instruction provided by verbal instruction, demonstration, patient participation, and written materials to support subject.  Instructors address safety measures for doing things around the house.  Discuss how to get up and down off the floor, how to pick things up properly, how to safely get  out of a chair without assistance, and balance  training.   Meditation and Mindfulness:  -Group instruction provided by verbal instruction, patient participation, and written materials to support subject.  Instructor addresses importance of mindfulness and meditation practice to help reduce stress and improve awareness.  Instructor also leads participants through a meditation exercise.    Stretching for Flexibility and Mobility:  -Group instruction provided by verbal instruction, patient participation, and written materials to support subject.  Instructors lead participants through series of stretches that are designed to increase flexibility thus improving mobility.  These stretches are additional exercise for major muscle groups that are typically performed during regular warm up and cool down.   Hands Only CPR:  -Group verbal, video, and participation provides a basic overview of AHA guidelines for community CPR. Role-play of emergencies allow participants the opportunity to practice calling for help and chest compression technique with discussion of AED use.   Hypertension: -Group verbal and written instruction that provides a basic overview of hypertension including the most recent diagnostic guidelines, risk factor reduction with self-care instructions and medication management.    Nutrition I class: Heart Healthy Eating:  -Group instruction provided by PowerPoint slides, verbal discussion, and written materials to support subject matter. The instructor gives an explanation and review of the Therapeutic Lifestyle Changes diet recommendations, which includes a discussion on lipid goals, dietary fat, sodium, fiber, plant stanol/sterol esters, sugar, and the components of a well-balanced, healthy diet.   Nutrition II class: Lifestyle Skills:  -Group instruction provided by PowerPoint slides, verbal discussion, and written materials to support subject matter. The instructor gives an  explanation and review of label reading, grocery shopping for heart health, heart healthy recipe modifications, and ways to make healthier choices when eating out.   Diabetes Question & Answer:  -Group instruction provided by PowerPoint slides, verbal discussion, and written materials to support subject matter. The instructor gives an explanation and review of diabetes co-morbidities, pre- and post-prandial blood glucose goals, pre-exercise blood glucose goals, signs, symptoms, and treatment of hypoglycemia and hyperglycemia, and foot care basics.   Diabetes Blitz:  -Group instruction provided by PowerPoint slides, verbal discussion, and written materials to support subject matter. The instructor gives an explanation and review of the physiology behind type 1 and type 2 diabetes, diabetes medications and rational behind using different medications, pre- and post-prandial blood glucose recommendations and Hemoglobin A1c goals, diabetes diet, and exercise including blood glucose guidelines for exercising safely.    Portion Distortion:  -Group instruction provided by PowerPoint slides, verbal discussion, written materials, and food models to support subject matter. The instructor gives an explanation of serving size versus portion size, changes in portions sizes over the last 20 years, and what consists of a serving from each food group.   Stress Management:  -Group instruction provided by verbal instruction, video, and written materials to support subject matter.  Instructors review role of stress in heart disease and how to cope with stress positively.     Exercising on Your Own:  -Group instruction provided by verbal instruction, power point, and written materials to support subject.  Instructors discuss benefits of exercise, components of exercise, frequency and intensity of exercise, and end points for exercise.  Also discuss use of nitroglycerin and activating EMS.  Review options of places to  exercise outside of rehab.  Review guidelines for sex with heart disease.   Cardiac Drugs I:  -Group instruction provided by verbal instruction and written materials to support subject.  Instructor reviews cardiac drug classes: antiplatelets, anticoagulants, beta blockers, and  statins.  Instructor discusses reasons, side effects, and lifestyle considerations for each drug class.   Cardiac Drugs II:  -Group instruction provided by verbal instruction and written materials to support subject.  Instructor reviews cardiac drug classes: angiotensin converting enzyme inhibitors (ACE-I), angiotensin II receptor blockers (ARBs), nitrates, and calcium channel blockers.  Instructor discusses reasons, side effects, and lifestyle considerations for each drug class.   Anatomy and Physiology of the Circulatory System:  Group verbal and written instruction and models provide basic cardiac anatomy and physiology, with the coronary electrical and arterial systems. Review of: AMI, Angina, Valve disease, Heart Failure, Peripheral Artery Disease, Cardiac Arrhythmia, Pacemakers, and the ICD.   Other Education:  -Group or individual verbal, written, or video instructions that support the educational goals of the cardiac rehab program.   Holiday Eating Survival Tips:  -Group instruction provided by PowerPoint slides, verbal discussion, and written materials to support subject matter. The instructor gives patients tips, tricks, and techniques to help them not only survive but enjoy the holidays despite the onslaught of food that accompanies the holidays.   Knowledge Questionnaire Score:  Knowledge Questionnaire Score - 12/11/20 1445       Knowledge Questionnaire Score   Pre Score 10/24             Core Components/Risk Factors/Patient Goals at Admission:  Personal Goals and Risk Factors at Admission - 12/11/20 1318       Core Components/Risk Factors/Patient Goals on Admission    Weight Management  Yes;Weight Maintenance    Intervention Weight Management: Develop a combined nutrition and exercise program designed to reach desired caloric intake, while maintaining appropriate intake of nutrient and fiber, sodium and fats, and appropriate energy expenditure required for the weight goal.;Weight Management: Provide education and appropriate resources to help participant work on and attain dietary goals.    Expected Outcomes Short Term: Continue to assess and modify interventions until short term weight is achieved;Long Term: Adherence to nutrition and physical activity/exercise program aimed toward attainment of established weight goal;Weight Maintenance: Understanding of the daily nutrition guidelines, which includes 25-35% calories from fat, 7% or less cal from saturated fats, less than '200mg'$  cholesterol, less than 1.5gm of sodium, & 5 or more servings of fruits and vegetables daily;Understanding recommendations for meals to include 15-35% energy as protein, 25-35% energy from fat, 35-60% energy from carbohydrates, less than '200mg'$  of dietary cholesterol, 20-35 gm of total fiber daily;Understanding of distribution of calorie intake throughout the day with the consumption of 4-5 meals/snacks    Diabetes Yes    Intervention Provide education about signs/symptoms and action to take for hypo/hyperglycemia.;Provide education about proper nutrition, including hydration, and aerobic/resistive exercise prescription along with prescribed medications to achieve blood glucose in normal ranges: Fasting glucose 65-99 mg/dL    Expected Outcomes Short Term: Participant verbalizes understanding of the signs/symptoms and immediate care of hyper/hypoglycemia, proper foot care and importance of medication, aerobic/resistive exercise and nutrition plan for blood glucose control.;Long Term: Attainment of HbA1C < 7%.    Hypertension Yes    Intervention Provide education on lifestyle modifcations including regular physical  activity/exercise, weight management, moderate sodium restriction and increased consumption of fresh fruit, vegetables, and low fat dairy, alcohol moderation, and smoking cessation.;Monitor prescription use compliance.    Expected Outcomes Short Term: Continued assessment and intervention until BP is < 140/66m HG in hypertensive participants. < 130/891mHG in hypertensive participants with diabetes, heart failure or chronic kidney disease.;Long Term: Maintenance of blood pressure at goal levels.  Lipids Yes    Intervention Provide education and support for participant on nutrition & aerobic/resistive exercise along with prescribed medications to achieve LDL '70mg'$ , HDL >'40mg'$ .    Expected Outcomes Short Term: Participant states understanding of desired cholesterol values and is compliant with medications prescribed. Participant is following exercise prescription and nutrition guidelines.;Long Term: Cholesterol controlled with medications as prescribed, with individualized exercise RX and with personalized nutrition plan. Value goals: LDL < '70mg'$ , HDL > 40 mg.    Stress Yes    Intervention Offer individual and/or small group education and counseling on adjustment to heart disease, stress management and health-related lifestyle change. Teach and support self-help strategies.;Refer participants experiencing significant psychosocial distress to appropriate mental health specialists for further evaluation and treatment. When possible, include family members and significant others in education/counseling sessions.    Expected Outcomes Short Term: Participant demonstrates changes in health-related behavior, relaxation and other stress management skills, ability to obtain effective social support, and compliance with psychotropic medications if prescribed.;Long Term: Emotional wellbeing is indicated by absence of clinically significant psychosocial distress or social isolation.             Core  Components/Risk Factors/Patient Goals Review:    Core Components/Risk Factors/Patient Goals at Discharge (Final Review):    ITP Comments:  ITP Comments     Row Name 12/11/20 1317           ITP Comments Medical Director- Dr. Fransico Him, MD                Comments: Patient attended orientation for the cardiac rehabilitation program on 12/11/2020 to review rules and guidelines for the program. Completed 6-minute walk test, Initial ITP, and exercise prescription. Vital signs stable. Telemetry- Normal sinus rhythm with rare PVCs, asymptomatic. Safety measures and social distancing in place per CDC guidelines.  Sol Passer, MS, ACSM CEP

## 2020-12-12 ENCOUNTER — Telehealth (HOSPITAL_COMMUNITY): Payer: Self-pay | Admitting: *Deleted

## 2020-12-13 ENCOUNTER — Other Ambulatory Visit: Payer: Self-pay

## 2020-12-13 ENCOUNTER — Encounter (HOSPITAL_COMMUNITY)
Admission: RE | Admit: 2020-12-13 | Discharge: 2020-12-13 | Disposition: A | Discharge status: Home / Self Care | Payer: MEDICAID | Source: Ambulatory Visit | Attending: Cardiovascular Disease | Admitting: Cardiovascular Disease

## 2020-12-13 NOTE — Progress Notes (Signed)
[name] 60 y.o. male Nutrition Note  Diagnosis: DES RCA  Past Medical History:  Diagnosis Date   Asthma    CAD (coronary artery disease)    a. CAD s/p anterior MI in 2008 treated with DES to the LAD.   Cataract 2018   both eyes   Cellulitis of leg, right 10/2014   COPD (chronic obstructive pulmonary disease) ([place])    Dental caries    Diabetes mellitus    TYPE 2   GERD (gastroesophageal reflux disease)    Hyperlipidemia    Hypertension 2017   Ischemic cardiomyopathy    Leukocytosis    noted on labs   Microcytic anemia    Myocardial infarction [facility]) 2008   Reactive airway disease    Thyroid nodule    biopsy negative 2006     Medications reviewed.   Current Outpatient Medications:    acetaminophen (TYLENOL) 500 MG tablet, Take 2 tablets (1,000 mg total) by mouth every 8 (eight) hours. (Patient not taking: Reported on 11/16/2020), Disp: 30 tablet, Rfl: 1   ADVAIR HFA 230-21 MCG/ACT inhaler, INHALE 2 PUFFS INTO THE LUNGS 2 TIMES A DAY., Disp: 1 each, Rfl: 2   aspirin EC 81 MG tablet, Take 1 tablet (81 mg total) by mouth daily., Disp: 30 tablet, Rfl: 11   atorvastatin (LIPITOR) 80 MG tablet, Take 1 tablet (80 mg total) by mouth at bedtime., Disp: 90 tablet, Rfl: 3   bisoprolol (ZEBETA) 5 MG tablet, Take 0.5 tablets (2.5 mg total) by mouth daily., Disp: 45 tablet, Rfl: 3   calcium-vitamin D (OSCAL WITH D) 250-125 MG-UNIT tablet, Take 1 tablet by mouth 2 (two) times daily., Disp: , Rfl:    clopidogrel (PLAVIX) 75 MG tablet, Take 1 tablet (75 mg total) by mouth daily with breakfast., Disp: 90 tablet, Rfl: 3   FARXIGA 10 MG TABS tablet, TAKE 1 TABLET ('10MG'$  TOTAL) BY MOUTH DAILY., Disp: 90 tablet, Rfl: 0   ferrous sulfate 325 (65 FE) MG tablet, Take 325 mg by mouth daily with breakfast., Disp: , Rfl:    metFORMIN (GLUCOPHAGE) 1000 MG tablet, Take 1 tablet (1,000 mg total) by mouth 2 (two) times daily with a meal., Disp: 180 tablet, Rfl: 1   Multiple Vitamin (MULTIVITAMIN WITH  MINERALS) TABS tablet, Take 1 tablet by mouth daily., Disp: , Rfl:    nitroGLYCERIN (NITROSTAT) 0.4 MG SL tablet, Place 1 tablet (0.4 mg total) under the tongue every 5 (five) minutes x 3 doses as needed for chest pain., Disp: 25 tablet, Rfl: 2   pantoprazole (PROTONIX) 20 MG tablet, Take 1 tablet (20 mg total) by mouth daily., Disp: 30 tablet, Rfl: 0   predniSONE (DELTASONE) 10 MG tablet, Take 1 tablet by mouth once daily with breakfast, Disp: 30 tablet, Rfl: 0   PROAIR HFA 108 (90 Base) MCG/ACT inhaler, INHALE 2 PUFFS INTO THE LUNGS EVERY 4 HOURS AS NEEDED FOR WHEEZING OR SHORTNESS OF BREATH (PLAN B)., Disp: 8 each, Rfl: 5   valsartan (DIOVAN) 40 MG tablet, Take 1 tablet (40 mg total) by mouth daily., Disp: 90 tablet, Rfl: 3   Ht Readings from Last 1 Encounters:  12/11/20 '5\' 8"'$  (1.727 m)     Wt Readings from Last 3 Encounters:  12/11/20 146 lb 9.7 oz (66.5 kg)  11/16/20 145 lb (65.8 kg)  10/18/20 147 lb (66.7 kg)     There is no height or weight on file to calculate BMI.   Social History   Tobacco Use  Smoking Status  Former   Packs/day: 0.50   Years: 22.00   Pack years: 11.00   Types: Cigarettes   Quit date: 04/21/2006   Years since quitting: 14.6  Smokeless Tobacco Never  Tobacco Comments   quit 10 years ago     Lab Results  Component Value Date   CHOL 101 11/09/2020   Lab Results  Component Value Date   HDL 59 11/09/2020   Lab Results  Component Value Date   LDLCALC 20 11/09/2020   Lab Results  Component Value Date   TRIG 129 11/09/2020     Lab Results  Component Value Date   HGBA1C 7.4 (H) 09/22/2020     CBG (last 3)  No results for input(s): GLUCAP in the last 72 hours.   Nutrition Note  Spoke with pt. Nutrition Plan and Nutrition Survey goals reviewed with pt. Pt is following a Heart Healthy diet. Pt eats pescatarian diet.   Pt has Type 2 Diabetes. Last A1c indicates blood glucose well-controlled. Pt checks CBG's 1-2 times a day. Fasting CBG's  reportedly 100-110 mg/dL. Post prandial <150 mg/dl.  Pt with dx of CHF. Per discussion, pt does not use canned/convenience foods often. Pt does not add salt to food. Pt does not eat out frequently. Pt is aware of sodium restriction and has been limiting fluid. He weighs daily.   Pt expressed understanding of the information reviewed.  Nutrition Diagnosis Food-and nutrition-related knowledge deficit related to lack of exposure to information as related to diagnosis of: ? CVD ? Type 2 Diabetes  Nutrition Intervention Pt's individual nutrition plan reviewed with pt. Benefits of adopting Heart Healthy diet discussed when Picture Your Plate reviewed. Continue client-centered nutrition education by RD, as part of interdisciplinary care.  Goal(s)  Pt to build a healthy plate including vegetables, fruits, whole grains, and low-fat dairy products in a heart healthy meal plan. Improved blood glucose control as evidenced by pt's A1c trending from 7.4 toward less than 7.0.   Plan:  Will provide client-centered nutrition education as part of interdisciplinary care Monitor and evaluate progress toward nutrition goal with team.   Michaele Offer, MS, RDN, LDN, CDCES

## 2020-12-19 ENCOUNTER — Telehealth (HOSPITAL_COMMUNITY): Payer: Self-pay | Admitting: *Deleted

## 2020-12-19 NOTE — Telephone Encounter (Signed)
Called patient to find out if he was able to set up the [place] on his phone for the virtual cardiac rehab program. Patient hasn't installed app on his phone yet but will try. I gave patient option to come in if he needed further assistance. Will follow-up with patient next week.  Sol Passer, MS, ACSM CEP 12/19/20 1035

## 2020-12-25 ENCOUNTER — Other Ambulatory Visit: Payer: Self-pay

## 2020-12-26 ENCOUNTER — Encounter (HOSPITAL_COMMUNITY)
Admission: RE | Admit: 2020-12-26 | Discharge: 2020-12-26 | Disposition: A | Discharge status: Home / Self Care | Payer: MEDICAID | Source: Ambulatory Visit | Attending: Internal Medicine | Admitting: Internal Medicine

## 2020-12-26 NOTE — Progress Notes (Signed)
Cardiac Rehab: Virtual Visit  Patient participates in the virtual cardiac rehab program. Called patient to follow-up on progress with exercise at home. Patient is walking 15-20 minutes 4 days/week, occasionally twice/day as his mode of exercise. Patient states he could walk 30 minutes but stops at about 15 minutes because of the heat and getting sweaty and needing to rest. Encouraged patient to try to accumulate 30 minutes of walking, and patient is amenable to this. Patient is also stretching in the morning. Patient hasn't been doing the resistance exercises but is amenable to adding these in 1-2 days/week. Patient doesn't have hand weights at home but can use cans or water bottles for his hand weight exercises. Other than needing to rest because of the heat, patient states he's feeling very good with his walking. He feels stronger and can walk longer before feeling tired. Patient is scheduled for an echocardiogram tomorrow and is hoping he will not continue to need to wear the Lifevest.   Patient will increase duration of walking to 30 minutes, 4 days/week either 30 minutes once daily or 15 minutes twice daily taking rest breaks as needed. Patient will incorporate hand weight exercises 1-2 days/week.  Encouraged patient to read the education booklet provided at orientation on risk factor modification. Patient states he's doing well with his nutrition. Will follow-up next week.  Sol Passer, MS, ACSM CEP 12/26/2020 1614

## 2020-12-28 ENCOUNTER — Ambulatory Visit (HOSPITAL_COMMUNITY): Payer: Self-pay | Attending: Internal Medicine

## 2020-12-28 ENCOUNTER — Other Ambulatory Visit: Payer: Self-pay

## 2020-12-28 DIAGNOSIS — I214 Non-ST elevation (NSTEMI) myocardial infarction: Secondary | ICD-10-CM | POA: Insufficient documentation

## 2020-12-28 DIAGNOSIS — I10 Essential (primary) hypertension: Secondary | ICD-10-CM | POA: Insufficient documentation

## 2020-12-28 DIAGNOSIS — E782 Mixed hyperlipidemia: Secondary | ICD-10-CM | POA: Insufficient documentation

## 2020-12-28 DIAGNOSIS — I472 Ventricular tachycardia, unspecified: Secondary | ICD-10-CM

## 2020-12-28 DIAGNOSIS — I5020 Unspecified systolic (congestive) heart failure: Secondary | ICD-10-CM | POA: Insufficient documentation

## 2020-12-28 DIAGNOSIS — I255 Ischemic cardiomyopathy: Secondary | ICD-10-CM | POA: Insufficient documentation

## 2020-12-28 LAB — ECHOCARDIOGRAM LIMITED
Area-P 1/2: 4.39 cm2
S' Lateral: 4.2 cm

## 2020-12-28 MED ORDER — PERFLUTREN LIPID MICROSPHERE
3.0000 mL | INTRAVENOUS | Status: AC | PRN
  Administered 2020-12-28: 3 mL via INTRAVENOUS

## 2021-01-03 ENCOUNTER — Telehealth: Payer: Self-pay | Admitting: Cardiovascular Disease

## 2021-01-03 NOTE — Telephone Encounter (Signed)
Facility called to say that the reordered needed to be sign and date by the dr. Aldean Jewett advise

## 2021-01-03 NOTE — Telephone Encounter (Signed)
[facility] reorder for and supporting documentation faxed.

## 2021-01-08 ENCOUNTER — Encounter (HOSPITAL_COMMUNITY)
Admission: RE | Admit: 2021-01-08 | Discharge: 2021-01-08 | Disposition: A | Discharge status: Home / Self Care | Payer: MEDICAID | Source: Ambulatory Visit | Attending: Cardiovascular Disease | Admitting: Cardiovascular Disease

## 2021-01-08 NOTE — Progress Notes (Signed)
Cardiac Rehab: Virtual Visit  Patient participates in the virtual cardiac rehab program via the [facility] app. Called patient to check progress with exercise. Patient is walking 20-25 minutes, 4 days, sometimes 5 days/week. Patient hasn't had any symptoms with walking, sometimes a little tired, but otherwise tolerating exercise well. Patient states he feels better after exercising. Encouraged patient to increase walking duration to 30 minutes, and he is amenable to this. Patient still wearing Lifevest, with a follow-up scheduled with his cardiologist in October. Patient has the [place] on his phone but has not been able to get logged in. Patient will come for an in-person visit on Friday 01/11/21 for helping setting up the app.   Sol Passer, MS, ACSM CEP 01/08/2021 [phone]

## 2021-01-11 ENCOUNTER — Encounter (HOSPITAL_COMMUNITY)
Admission: RE | Admit: 2021-01-11 | Discharge: 2021-01-11 | Disposition: A | Discharge status: Home / Self Care | Payer: MEDICAID | Source: Ambulatory Visit | Attending: Internal Medicine | Admitting: Internal Medicine

## 2021-01-11 ENCOUNTER — Other Ambulatory Visit: Payer: Self-pay

## 2021-01-11 NOTE — Progress Notes (Signed)
Cardiac Rehab: Virtual Visit  Patient participates in the virtual cardiac rehab program via the [facility] app. Patient came in today to get help setting app on his phone. Patient successfully able to register app on phone. Oriented patient to app features. Patient encouraged to call or send message in-app if he has any questions or concerns. Patient is doing well with exercise and is currently walking 15 minutes in the morning and 15 minutes in the evening without difficulty. Will continue to follow patient in the app.  Sol Passer, MS, ACSM Juanito Doom 01/11/2021 [id number]

## 2021-01-21 ENCOUNTER — Ambulatory Visit
Admission: RE | Admit: 2021-01-21 | Discharge: 2021-01-21 | Disposition: A | Discharge status: Home / Self Care | Payer: No Typology Code available for payment source | Source: Ambulatory Visit | Attending: Family Medicine | Admitting: Family Medicine

## 2021-01-21 ENCOUNTER — Other Ambulatory Visit: Payer: Self-pay

## 2021-01-21 DIAGNOSIS — Z7952 Long term (current) use of systemic steroids: Secondary | ICD-10-CM

## 2021-01-22 ENCOUNTER — Telehealth: Payer: Self-pay | Admitting: Licensed Clinical Social Worker

## 2021-01-22 ENCOUNTER — Ambulatory Visit (INDEPENDENT_AMBULATORY_CARE_PROVIDER_SITE_OTHER): Payer: No Typology Code available for payment source | Admitting: Internal Medicine

## 2021-01-22 ENCOUNTER — Encounter: Payer: Self-pay | Admitting: Internal Medicine

## 2021-01-22 VITALS — BP 130/80 | HR 69 | Ht 68.0 in | Wt 141.0 lb

## 2021-01-22 DIAGNOSIS — I255 Ischemic cardiomyopathy: Secondary | ICD-10-CM

## 2021-01-22 DIAGNOSIS — I472 Ventricular tachycardia, unspecified: Secondary | ICD-10-CM

## 2021-01-22 NOTE — Progress Notes (Signed)
HPI 60 yo male with history of CAD, VT, GERD, DM, asthma, hyperlipidemia here today for cardiac follow up. In 2008 he had an anterior MI treated with a drug eluting stent in the LAD. His ejection fraction was 45% at that time. Myoview 2010 showed no ischemia and showed an ejection fraction of 40%. Echo January 2016 with LVEF=40%. Apical hypokinesis. No LV thrombus seen. Echo March 2021 with LVEF=35-40%. He has been on Entresto. Admitted to [facility] June 2022 with sustained VT in the setting of a NSTEMI. The severe RCA stenosis was treated with a drug eluting stent. The LAD stent was patent. Echo 09/21/20 with LVEF=30-35%. No valve disease. He was discharged with a Lifevest with plans for ICD if LVEF below 35% after 3 months or for EP study if LVEF over 35%. He was not discharged on amiodarone. He was discharged on ASA, Plavix, bisoprolol. Entresto stopped due to hypotension.    He is here today for follow up. The patient denies any chest pain, dyspnea, palpitations, lower extremity edema, orthopnea, PND, dizziness, near syncope or syncope. He is wearing his Lifevest.   A repeat 2D echo shows that his EF is 30%. He is pending medicaid approval.   No Known Allergies   Current Outpatient Medications  Medication Sig Dispense Refill   acetaminophen (TYLENOL) 500 MG tablet Take 2 tablets (1,000 mg total) by mouth every 8 (eight) hours. 30 tablet 1   ADVAIR HFA 230-21 MCG/ACT inhaler INHALE 2 PUFFS INTO THE LUNGS 2 TIMES A DAY. 1 each 2   aspirin EC 81 MG tablet Take 1 tablet (81 mg total) by mouth daily. 30 tablet 11   atorvastatin (LIPITOR) 80 MG tablet Take 1 tablet (80 mg total) by mouth at bedtime. 90 tablet 3   bisoprolol (ZEBETA) 5 MG tablet Take 0.5 tablets (2.5 mg total) by mouth daily. 45 tablet 3   calcium-vitamin D (OSCAL WITH D) 250-125 MG-UNIT tablet Take 1 tablet by mouth 2 (two) times daily.     clopidogrel (PLAVIX) 75 MG tablet Take 1 tablet (75 mg total) by mouth daily with  breakfast. 90 tablet 3   FARXIGA 10 MG TABS tablet TAKE 1 TABLET (10MG  TOTAL) BY MOUTH DAILY. 90 tablet 0   ferrous sulfate 325 (65 FE) MG tablet Take 325 mg by mouth daily with breakfast.     metFORMIN (GLUCOPHAGE) 1000 MG tablet Take 1 tablet (1,000 mg total) by mouth 2 (two) times daily with a meal. 180 tablet 1   Multiple Vitamin (MULTIVITAMIN WITH MINERALS) TABS tablet Take 1 tablet by mouth daily.     nitroGLYCERIN (NITROSTAT) 0.4 MG SL tablet Place 1 tablet (0.4 mg total) under the tongue every 5 (five) minutes x 3 doses as needed for chest pain. 25 tablet 2   pantoprazole (PROTONIX) 20 MG tablet Take 1 tablet (20 mg total) by mouth daily. 30 tablet 0   predniSONE (DELTASONE) 10 MG tablet Take 1 tablet by mouth once daily with breakfast 30 tablet 0   PROAIR HFA 108 (90 Base) MCG/ACT inhaler INHALE 2 PUFFS INTO THE LUNGS EVERY 4 HOURS AS NEEDED FOR WHEEZING OR SHORTNESS OF BREATH (PLAN B). 8 each 5   valsartan (DIOVAN) 40 MG tablet Take 1 tablet (40 mg total) by mouth daily. 90 tablet 3   No current facility-administered medications for this visit.     Past Medical History:  Diagnosis Date   Asthma    CAD (coronary artery disease)    a.  CAD s/p anterior MI in 2008 treated with DES to the LAD.   Cataract 2018   both eyes   Cellulitis of leg, right 10/2014   COPD (chronic obstructive pulmonary disease) ([place])    Dental caries    Diabetes mellitus    TYPE 2   GERD (gastroesophageal reflux disease)    Hyperlipidemia    Hypertension 2017   Ischemic cardiomyopathy    Leukocytosis    noted on labs   Microcytic anemia    Myocardial infarction [facility]) 2008   Reactive airway disease    Thyroid nodule    biopsy negative 2006    ROS:   All systems reviewed and negative except as noted in the HPI.   Past Surgical History:  Procedure Laterality Date   CORONARY STENT INTERVENTION N/A 09/20/2020   Procedure: CORONARY STENT INTERVENTION;  Surgeon: Lorretta Harp, MD;  Location: [place] [facility];  Service: Cardiovascular;  Laterality: N/A;   CORONARY STENT PLACEMENT     ESOPHAGOGASTRODUODENOSCOPY N/A 03/29/2017   Procedure: ESOPHAGOGASTRODUODENOSCOPY (EGD);  Surgeon: Milus Banister, MD;  Location[facility] ENDOSCOPY;  Service: Endoscopy;  Laterality: N/A;   ESOPHAGOGASTRODUODENOSCOPY N/A 03/31/2018   Procedure: ESOPHAGOGASTRODUODENOSCOPY (EGD);  Surgeon: Doran Stabler, MD;  Location: [place];  Service: Gastroenterology;  Laterality: N/A;   FOREIGN BODY REMOVAL N/A 03/31/2018   Procedure: FOREIGN BODY REMOVAL;  Surgeon: Doran Stabler, MD;  Location: [place];  Service: Gastroenterology;  Laterality: N/A;   LEFT HEART CATH AND CORONARY ANGIOGRAPHY N/A 09/20/2020   Procedure: LEFT HEART CATH AND CORONARY ANGIOGRAPHY;  Surgeon: Lorretta Harp, MD;  Location: [place] CV [facility];  Service: Cardiovascular;  Laterality: N/A;   solitary pulm and thryoid nodules       Family History  Problem Relation Age of Onset   Diabetes Mother    Heart attack Brother 21   Colon cancer Neg Hx    Esophageal cancer Neg Hx    Rectal cancer Neg Hx    Stomach cancer Neg Hx      Social History   Socioeconomic History   Marital status: Married    Spouse name: Not on file   Number of children: 3   Years of education: Not on file   Highest education level: Not on file  Occupational History   Not on file  Tobacco Use   Smoking status: Former    Packs/day: 0.50    Years: 22.00    Pack years: 11.00    Types: Cigarettes    Quit date: 04/21/2006    Years since quitting: 14.7   Smokeless tobacco: Never   Tobacco comments:    quit 10 years ago  Vaping Use   Vaping Use: Never used  Substance and Sexual Activity   Alcohol use: No    Alcohol/week: 0.0 standard drinks   Drug use: No   Sexual activity: Yes    Birth control/protection: Condom  Other Topics Concern   Not on file  Social History Narrative   Lives with wife; recently moved to [place] from [place] due to health  problems. Has no means of affording meds currently + tobacco 1/2ppd x 50yrs, no ETOH no drugs.   Social Determinants of Health   Financial Resource Strain: High Risk   Difficulty of Paying Living Expenses: Hard  Food Insecurity: No Food Insecurity   Worried About Running Out of Food in the Last Year: Never true   Ran Out of Food in the Last Year:  Never true  Transportation Needs: No Transportation Needs   Lack of Transportation (Medical): No   Lack of Transportation (Non-Medical): No  Physical Activity: Not on file  Stress: Not on file  Social Connections: Not on file  Intimate Partner Violence: Not on file     BP 130/80 (BP Location: Left Arm, Patient Position: Sitting, Cuff Size: Normal)   Pulse 69   Ht 5\' 8"  (1.727 m)   Wt 141 lb (64 kg)   SpO2 96%   BMI 21.44 kg/m   Physical Exam:  Well appearing NAD HEENT: Unremarkable Neck:  No JVD, no thyromegally Lymphatics:  No adenopathy Back:  No CVA tenderness Lungs:  Clear with no wheezes HEART:  Regular rate rhythm, no murmurs, no rubs, no clicks Abd:  soft, positive bowel sounds, no organomegally, no rebound, no guarding Ext:  2 plus pulses, no edema, no cyanosis, no clubbing Skin:  No rashes no nodules Neuro:  CN II through XII intact, motor grossly intact  EKG - nsr with anteroapical MI  DEVICE  Normal device function.  See PaceArt for details.   Assess/Plan:  1. CAD without angina: Recent NSTEMI. RCA stenting and patency of old LAD stent. Medical therapy for branch vessel disease. Will plan to continue DAPT with ASA and Plavix for one year. Continue ASA, Plavix, bisoprolol and statin.      2. Cardiomyopathy, ischemic:  LVEF 30-35% by echo September 2022. Continue beta blocker. Entresto stopped due to hypotension. He is now tolerating valsartan. His EF did not improve.  See below regarding ICD   3. HTN: BP is well controlled. Continue current therapy   4. Hyperlipidemia: LDL at goal. Continue statin.    5. VT:  He has had no more VT. He is wearing the Life Vest. He will undergo ICD insertion once he has obtained coverage which he is actively working on.   Carleene Overlie Kyilee Claude Waldman,MD

## 2021-01-22 NOTE — H&P (View-Only) (Signed)
HPI 60 yo male with history of CAD, VT, GERD, DM, asthma, hyperlipidemia here today for cardiac follow up. In 2008 he had an anterior MI treated with a drug eluting stent in the LAD. His ejection fraction was 45% at that time. Myoview 2010 showed no ischemia and showed an ejection fraction of 40%. Echo January 2016 with LVEF=40%. Apical hypokinesis. No LV thrombus seen. Echo March 2021 with LVEF=35-40%. He has been on Entresto. Admitted to [facility] June 2022 with sustained VT in the setting of a NSTEMI. The severe RCA stenosis was treated with a drug eluting stent. The LAD stent was patent. Echo 09/21/20 with LVEF=30-35%. No valve disease. He was discharged with a Lifevest with plans for ICD if LVEF below 35% after 3 months or for EP study if LVEF over 35%. He was not discharged on amiodarone. He was discharged on ASA, Plavix, bisoprolol. Entresto stopped due to hypotension.    He is here today for follow up. The patient denies any chest pain, dyspnea, palpitations, lower extremity edema, orthopnea, PND, dizziness, near syncope or syncope. He is wearing his Lifevest.   A repeat 2D echo shows that his EF is 30%. He is pending medicaid approval.   No Known Allergies   Current Outpatient Medications  Medication Sig Dispense Refill   acetaminophen (TYLENOL) 500 MG tablet Take 2 tablets (1,000 mg total) by mouth every 8 (eight) hours. 30 tablet 1   ADVAIR HFA 230-21 MCG/ACT inhaler INHALE 2 PUFFS INTO THE LUNGS 2 TIMES A DAY. 1 each 2   aspirin EC 81 MG tablet Take 1 tablet (81 mg total) by mouth daily. 30 tablet 11   atorvastatin (LIPITOR) 80 MG tablet Take 1 tablet (80 mg total) by mouth at bedtime. 90 tablet 3   bisoprolol (ZEBETA) 5 MG tablet Take 0.5 tablets (2.5 mg total) by mouth daily. 45 tablet 3   calcium-vitamin D (OSCAL WITH D) 250-125 MG-UNIT tablet Take 1 tablet by mouth 2 (two) times daily.     clopidogrel (PLAVIX) 75 MG tablet Take 1 tablet (75 mg total) by mouth daily with  breakfast. 90 tablet 3   FARXIGA 10 MG TABS tablet TAKE 1 TABLET (10MG  TOTAL) BY MOUTH DAILY. 90 tablet 0   ferrous sulfate 325 (65 FE) MG tablet Take 325 mg by mouth daily with breakfast.     metFORMIN (GLUCOPHAGE) 1000 MG tablet Take 1 tablet (1,000 mg total) by mouth 2 (two) times daily with a meal. 180 tablet 1   Multiple Vitamin (MULTIVITAMIN WITH MINERALS) TABS tablet Take 1 tablet by mouth daily.     nitroGLYCERIN (NITROSTAT) 0.4 MG SL tablet Place 1 tablet (0.4 mg total) under the tongue every 5 (five) minutes x 3 doses as needed for chest pain. 25 tablet 2   pantoprazole (PROTONIX) 20 MG tablet Take 1 tablet (20 mg total) by mouth daily. 30 tablet 0   predniSONE (DELTASONE) 10 MG tablet Take 1 tablet by mouth once daily with breakfast 30 tablet 0   PROAIR HFA 108 (90 Base) MCG/ACT inhaler INHALE 2 PUFFS INTO THE LUNGS EVERY 4 HOURS AS NEEDED FOR WHEEZING OR SHORTNESS OF BREATH (PLAN B). 8 each 5   valsartan (DIOVAN) 40 MG tablet Take 1 tablet (40 mg total) by mouth daily. 90 tablet 3   No current facility-administered medications for this visit.     Past Medical History:  Diagnosis Date   Asthma    CAD (coronary artery disease)    a.  CAD s/p anterior MI in 2008 treated with DES to the LAD.   Cataract 2018   both eyes   Cellulitis of leg, right 10/2014   COPD (chronic obstructive pulmonary disease) ([place])    Dental caries    Diabetes mellitus    TYPE 2   GERD (gastroesophageal reflux disease)    Hyperlipidemia    Hypertension 2017   Ischemic cardiomyopathy    Leukocytosis    noted on labs   Microcytic anemia    Myocardial infarction [facility]) 2008   Reactive airway disease    Thyroid nodule    biopsy negative 2006    ROS:   All systems reviewed and negative except as noted in the HPI.   Past Surgical History:  Procedure Laterality Date   CORONARY STENT INTERVENTION N/A 09/20/2020   Procedure: CORONARY STENT INTERVENTION;  Surgeon: Lorretta Harp, MD;  Location: [place] [facility];  Service: Cardiovascular;  Laterality: N/A;   CORONARY STENT PLACEMENT     ESOPHAGOGASTRODUODENOSCOPY N/A 03/29/2017   Procedure: ESOPHAGOGASTRODUODENOSCOPY (EGD);  Surgeon: Milus Banister, MD;  Location[facility];  Service: Endoscopy;  Laterality: N/A;   ESOPHAGOGASTRODUODENOSCOPY N/A 03/31/2018   Procedure: ESOPHAGOGASTRODUODENOSCOPY (EGD);  Surgeon: Doran Stabler, MD;  Location: [place];  Service: Gastroenterology;  Laterality: N/A;   FOREIGN BODY REMOVAL N/A 03/31/2018   Procedure: FOREIGN BODY REMOVAL;  Surgeon: Doran Stabler, MD;  Location: [place];  Service: Gastroenterology;  Laterality: N/A;   LEFT HEART CATH AND CORONARY ANGIOGRAPHY N/A 09/20/2020   Procedure: LEFT HEART CATH AND CORONARY ANGIOGRAPHY;  Surgeon: Lorretta Harp, MD;  Location: [place] [facility];  Service: Cardiovascular;  Laterality: N/A;   solitary pulm and thryoid nodules       Family History  Problem Relation Age of Onset   Diabetes Mother    Heart attack Brother 55   Colon cancer Neg Hx    Esophageal cancer Neg Hx    Rectal cancer Neg Hx    Stomach cancer Neg Hx      Social History   Socioeconomic History   Marital status: Married    Spouse name: Not on file   Number of children: 3   Years of education: Not on file   Highest education level: Not on file  Occupational History   Not on file  Tobacco Use   Smoking status: Former    Packs/day: 0.50    Years: 22.00    Pack years: 11.00    Types: Cigarettes    Quit date: 04/21/2006    Years since quitting: 14.7   Smokeless tobacco: Never   Tobacco comments:    quit 10 years ago  Vaping Use   Vaping Use: Never used  Substance and Sexual Activity   Alcohol use: No    Alcohol/week: 0.0 standard drinks   Drug use: No   Sexual activity: Yes    Birth control/protection: Condom  Other Topics Concern   Not on file  Social History Narrative   Lives with wife; recently moved to [place] from [place] due to health  problems. Has no means of affording meds currently + tobacco 1/2ppd x 23yrs, no ETOH no drugs.   Social Determinants of Health   Financial Resource Strain: High Risk   Difficulty of Paying Living Expenses: Hard  Food Insecurity: No Food Insecurity   Worried About Running Out of Food in the Last Year: Never true   Ran Out of Food in the Last Year:  Never true  Transportation Needs: No Transportation Needs   Lack of Transportation (Medical): No   Lack of Transportation (Non-Medical): No  Physical Activity: Not on file  Stress: Not on file  Social Connections: Not on file  Intimate Partner Violence: Not on file     BP 130/80 (BP Location: Left Arm, Patient Position: Sitting, Cuff Size: Normal)   Pulse 69   Ht 5\' 8"  (1.727 m)   Wt 141 lb (64 kg)   SpO2 96%   BMI 21.44 kg/m   Physical Exam:  Well appearing NAD HEENT: Unremarkable Neck:  No JVD, no thyromegally Lymphatics:  No adenopathy Back:  No CVA tenderness Lungs:  Clear with no wheezes HEART:  Regular rate rhythm, no murmurs, no rubs, no clicks Abd:  soft, positive bowel sounds, no organomegally, no rebound, no guarding Ext:  2 plus pulses, no edema, no cyanosis, no clubbing Skin:  No rashes no nodules Neuro:  CN II through XII intact, motor grossly intact  EKG - nsr with anteroapical MI  DEVICE  Normal device function.  See PaceArt for details.   Assess/Plan:  1. CAD without angina: Recent NSTEMI. RCA stenting and patency of old LAD stent. Medical therapy for branch vessel disease. Will plan to continue DAPT with ASA and Plavix for one year. Continue ASA, Plavix, bisoprolol and statin.      2. Cardiomyopathy, ischemic:  LVEF 30-35% by echo September 2022. Continue beta blocker. Entresto stopped due to hypotension. He is now tolerating valsartan. His EF did not improve.  See below regarding ICD   3. HTN: BP is well controlled. Continue current therapy   4. Hyperlipidemia: LDL at goal. Continue statin.    5. VT:  He has had no more VT. He is wearing the Life Vest. He will undergo ICD insertion once he has obtained coverage which he is actively working on.   Carleene Overlie Darthy Manganelli,MD

## 2021-01-22 NOTE — Patient Instructions (Addendum)
Medication Instructions:  Your physician recommends that you continue on your current medications as directed. Please refer to the Current Medication list given to you today.  Labwork: None ordered.  Testing/Procedures: Your physician has recommended that you have a defibrillator inserted. An implantable cardioverter defibrillator (ICD) is a small device that is placed in your chest or, in rare cases, your abdomen. This device uses electrical pulses or shocks to help control life-threatening, irregular heartbeats that could lead the heart to suddenly stop beating (sudden cardiac arrest). Leads are attached to the ICD that goes into your heart. This is done in the hospital and usually requires an overnight stay. Please see the instruction sheet given to you today for more information.  Follow-Up: I will contact you to schedule an ICD implant   Any Other Special Instructions Will Be Listed Below (If Applicable).  If you need a refill on your cardiac medications before your next appointment, please call your pharmacy.   Cardioverter Defibrillator Implantation An implantable cardioverter defibrillator (ICD) is a device that identifies and corrects abnormal heart rhythms. Cardioverter defibrillator implantation is a surgery to place an ICD under the skin in the chest or abdomen. An ICD has a battery, a small computer (pulse generator), and wires (leads) that go into the heart. The ICD detects and corrects two types of dangerous irregular heart rhythms (arrhythmias): A rapid heart rhythm in the lower chambers of the heart (ventricles). This is called ventricular tachycardia. The ventricles contracting in an uncoordinated way. This is called ventricular fibrillation. There are different types of ICDs, and the electrical signals from the ICD can be programmed differently based on the condition being treated. The electrical signals from the ICD can be low-energy pulses, high-energy shocks, or a  combination of the two. The low-energy pulses are generally used to restore the heartbeat to normal when it is either too slow (bradycardia) or too fast. These pulses are painless. The high-energy shocks are used to treat abnormal rhythms such as ventricular tachycardia or ventricular fibrillation. This shock may feel like a strong jolt in the chest. Your health care provider may recommend an ICD if you have: Had a ventricular arrhythmia in the past. A damaged heart because of a disease or heart condition. A weakened heart muscle from a heart attack or cardiac arrest. A congenital heart defect. Long QT syndrome, which is a disorder of the heart's electrical system. Brugada syndrome, which is a condition that causes a disruption of the heart's normal rhythm. Tell a health care provider about: Any allergies you have. All medicines you are taking, including vitamins, herbs, eye drops, creams, and over-the-counter medicines. Any problems you or family members have had with anesthetic medicines. Any blood disorders you have. Any surgeries you have had. Any medical conditions you have. Whether you are pregnant or may be pregnant. What are the risks? Generally, this is a safe procedure. However, problems may occur, including: Infection. Bleeding. Allergic reactions to medicines used during the procedure. Blood clots. Swelling or bruising. Damage to nearby structures or organs, such as nerves, lungs, blood vessels, or the heart where the ICD leads or pulse generator is implanted. What happens before the procedure? Staying hydrated Follow instructions from your health care provider about hydration, which may include: Up to 2 hours before the procedure - you may continue to drink clear liquids, such as water, clear fruit juice, black coffee, and plain tea.  Eating and drinking restrictions Follow instructions from your health care provider about eating and drinking,  which may include: 8 hours  before the procedure - stop eating heavy meals or foods, such as meat, fried foods, or fatty foods. 6 hours before the procedure - stop eating light meals or foods, such as toast or cereal. 6 hours before the procedure - stop drinking milk or drinks that contain milk. 2 hours before the procedure - stop drinking clear liquids. Medicines Ask your health care provider about: Changing or stopping your regular medicines. This is especially important if you are taking diabetes medicines or blood thinners. Taking medicines such as aspirin and ibuprofen. These medicines can thin your blood. Do not take these medicines unless your health care provider tells you to take them. Taking over-the-counter medicines, vitamins, herbs, and supplements. Tests You may have an exam or testing. These may include: Blood tests. A test to check the electrical signals in your heart (electrocardiogram, ECG). Imaging tests, such as a chest X-ray. Echocardiogram. This is an ultrasound of your heart to evaluate your heart structures and function. An event monitor or Holter monitor to wear at home. General instructions Do not use any products that contain nicotine or tobacco for at least 4 weeks before the procedure. These products include cigarettes, chewing tobacco, and vaping devices, such as e-cigarettes. If you need help quitting, ask your health care provider. Ask your health care provider: How your procedure site will be marked. What steps will be taken to help prevent infection. These may include: Removing hair at the surgery site. Washing skin with a germ-killing soap. Taking antibiotic medicine. You may be asked to shower with a germ-killing soap. Plan to have a responsible adult take you home from the hospital or clinic. What happens during the procedure?  Small monitors will be put on your body. They will be used to check your heart rate, blood pressure, and oxygen level. A pair of sticky pads  (defibrillator pads) may be placed on your back and chest. These pads are able to pace your heart as needed during the procedure. An IV will be inserted into one of your veins. You will be given one or more of the following: A medicine to help you relax (sedative). A medicine to numb the area (local anesthetic). A medicine to make you fall asleep(general anesthetic). A small incision will be made to create a deep pocket under the skin of your chest or abdomen. Leads will be guided through a blood vessel into your heart and attached to your heart muscles. Depending on the ICD, the leads may go into one ventricle, or they may go into both ventricles and into an upper chamber of the heart. An X-ray machine (fluoroscope) will be used to help guide the leads. The other end of the leads will be attached to the pulse generator. The pulse generator will be placed into the pocket under the skin. The ICD will be tested, and your health care provider will program the ICD for the condition being treated. The incision will be closed with stitches (sutures), skin glue, adhesive strips, or staples. A bandage (dressing) will be placed over the incision. The procedure may vary among health care providers and hospitals. What happens after the procedure? Your blood pressure, heart rate, breathing rate, and blood oxygen level will be monitored until you leave the hospital or clinic. Your health care provider will also monitor your ICD to make sure it is working properly. A chest X-ray will be taken to check that the ICD is in the right place. Do not raise  the arm on the side of your procedure higher than your shoulder for as long as told by your health care provider. This is usually at least 6 weeks. You may be given an identification card explaining that you have an ICD. You will be given a remote home monitoring device to use with your ICD to allow your device to communicate with your clinic. Summary An  implantable cardioverter defibrillator (ICD) is a device that identifies and corrects abnormal heart rhythms. Cardioverter defibrillator implantation is a surgery to place an ICD under the skin in the chest or abdomen. An ICD consists of a battery, a small computer (pulse generator), and wires (leads) that go into the heart. During the procedure, the ICD will be tested, and your health care provider will program the ICD for the condition being treated. After the procedure, a chest X-ray will be taken to check that the ICD is in the right place. This information is not intended to replace advice given to you by your health care provider. Make sure you discuss any questions you have with your health care provider. Document Revised: 10/05/2019 Document Reviewed: 10/05/2019 Elsevier Patient Education  [place].

## 2021-01-22 NOTE — Telephone Encounter (Signed)
LCSW team received referral again for pt as they are in need of additional procedures. Inquiry from [employer] regarding pt coverage. Previous conversations with pt had been that he had pending Medicaid application. Appears he may have received determination/applied for [employer] as [employer] on file and valid through 06/13/21. Pt will need to speak with Cherlynn Polo can speak with billing about whether or not needed testing is covered under [employer]. If not pt may be eligible also to apply for [facility] which I do not see on file. Attempted to reach pt at [phone]. No answer, message left, aware he may currently be in appt with Dr. Lovena Le. Will reach out again tomorrow if no call back today.   Westley Hummer, MSW, [place]  [phone]

## 2021-01-23 ENCOUNTER — Telehealth: Payer: Self-pay | Admitting: Licensed Clinical Social Worker

## 2021-01-23 NOTE — Telephone Encounter (Signed)
LCSW attempted again to reach pt via telephone at [phone]. No answer, message left requesting return call.  Westley Hummer, MSW, [place]  [phone]

## 2021-01-28 ENCOUNTER — Telehealth: Payer: Self-pay | Admitting: Licensed Clinical Social Worker

## 2021-01-28 ENCOUNTER — Other Ambulatory Visit: Payer: Self-pay | Admitting: *Deleted

## 2021-01-28 DIAGNOSIS — I2510 Atherosclerotic heart disease of native coronary artery without angina pectoris: Secondary | ICD-10-CM

## 2021-01-28 DIAGNOSIS — I214 Non-ST elevation (NSTEMI) myocardial infarction: Secondary | ICD-10-CM

## 2021-01-28 MED ORDER — ATORVASTATIN CALCIUM 80 MG PO TABS: 80.0000 mg | ORAL_TABLET | Freq: Every day | ORAL | 3 refills | Status: DC

## 2021-01-28 MED ORDER — CLOPIDOGREL BISULFATE 75 MG PO TABS: 75.0000 mg | ORAL_TABLET | Freq: Every day | ORAL | 3 refills | Status: DC

## 2021-01-28 NOTE — Telephone Encounter (Signed)
Pt needed refills to [facility] for lower cost per [employer], [place].

## 2021-01-28 NOTE — Progress Notes (Signed)
Heart and Vascular Care Navigation  01/28/2021  [name] 1960-07-14 [id number]  Reason for Referral:  Engaged with patient by telephone for follow up visit for Heart and Vascular Care Coordination.                                                                                                   Assessment:      LCSW was able to reach pt this morning via telephone at [phone] (also attempted home phone but that was disconnected therefore I have removed it from chart). I re-introduced myself, my role and reason for call. Pt and I had previously spoken and during that time he had stated that he had Medicaid pending application. At this time pt states that his Medicaid application was denied but his disability is pending. He has the [employer] but does not have updated [employer] Aid application at this time. He is interested in completing that and I have sent him the application and what supporting documentation he would need. I also included my card should he have any additional questions.                                 HRT/VAS Care Coordination     Patients Home Cardiology Office [facility]   Outpatient Care Team Social Worker   Social Worker Name: Valeda Malm, [place] [phone]   Living arrangements for the past 2 months Apartment   Lives with: Spouse   Patient Current Insurance Coverage Orange Card   Patient Has Concern With Paying Medical Bills Yes   Patient Concerns With Medical Bills upcoming procedures planned   Medical Bill Referrals: [facility] Financial Assistance   Does Patient Have Prescription Coverage? No   Patient Prescription Assistance Programs [place]; [place] [facility]; Other   Blue Card Medications directed pt to have his medications transferred to [facility] and request [employer] application   [place] [facility] Medications pt approved through 10/19/2021   Other Assistance Programs Medications [place] Devices/Equipment None       Social History:                                                                             SDOH Screenings   Alcohol Screen: Not on file  Depression (PHQ2-9): Low Risk    PHQ-2 Score: 0  Financial Resource Strain: High Risk   Difficulty of Paying Living Expenses: Hard  Food Insecurity: No Food Insecurity   Worried About Charity fundraiser in the Last Year: Never true   Ran Out of Food in the Last Year: Never true  Housing: Medium Risk   Last Housing Risk Score: 1  Physical Activity: Not on  file  Social Connections: Not on file  Stress: Not on file  Tobacco Use: Medium Risk   Smoking Tobacco Use: Former   Smokeless Tobacco Use: Never  Transportation Needs: No Transportation Needs   Lack of Transportation (Medical): No   Lack of Transportation (Non-Medical): No    SDOH Interventions: Financial Resources:    Occupational hygienist for [employer] Program     Other Care Navigation Interventions:     Provided Pharmacy assistance resources [place], [place] [facility], Other   Follow-up plan:   LCSW f/u with Myrtie Hawk, RN, to make her aware of the above assistance. Pt also has three prescriptions being sent to [facility] instead of [facility] where he could have them delivered for free. LCSW will f/u with Dr. Reatha Armour nurse to see if we can have them moved. LCSW will f/u in one week to see if received.

## 2021-01-29 ENCOUNTER — Encounter (HOSPITAL_COMMUNITY)
Admission: RE | Admit: 2021-01-29 | Discharge: 2021-01-29 | Disposition: A | Discharge status: Home / Self Care | Payer: MEDICAID | Source: Ambulatory Visit | Attending: Cardiovascular Disease | Admitting: Cardiovascular Disease

## 2021-01-29 ENCOUNTER — Other Ambulatory Visit: Payer: Self-pay

## 2021-01-29 NOTE — Progress Notes (Signed)
Cardiac Rehab: Virtual Visit  Patient participates in the virtual cardiac rehab program via [facility] app. Called patient to check progress with exercise, and patient is doing well. Patient is walking 30-35 minutes, 4 days/week and stretching in the morning. Patient is asymptomatic with exercise. Patient feels that his strength is about the same. Patient states he has ample energy to perform activities of daily living. Patient still feel tired, but states he is not sleeping well. Patient tries to follow a heart healthy, low sodium diet. Will follow-up with patient in the next 1-2 weeks.  Sol Passer, MS, ACSM CEP 01/29/2021 [phone]

## 2021-01-30 ENCOUNTER — Other Ambulatory Visit: Payer: Self-pay

## 2021-02-01 ENCOUNTER — Other Ambulatory Visit: Payer: Self-pay

## 2021-02-01 ENCOUNTER — Other Ambulatory Visit: Payer: No Typology Code available for payment source | Admitting: *Deleted

## 2021-02-01 DIAGNOSIS — Z01812 Encounter for preprocedural laboratory examination: Secondary | ICD-10-CM

## 2021-02-01 DIAGNOSIS — I472 Ventricular tachycardia, unspecified: Secondary | ICD-10-CM

## 2021-02-01 DIAGNOSIS — I255 Ischemic cardiomyopathy: Secondary | ICD-10-CM

## 2021-02-01 NOTE — Addendum Note (Signed)
Addended by: Drue Novel I on: 02/01/2021 11:24 AM   Modules accepted: Orders

## 2021-02-02 LAB — BASIC METABOLIC PANEL
BUN/Creatinine Ratio: 15 (ref 10–24)
BUN: 14 mg/dL (ref 8–27)
CO2: 21 mmol/L (ref 20–29)
Calcium: 9.5 mg/dL (ref 8.6–10.2)
Chloride: 100 mmol/L (ref 96–106)
Creatinine, Ser: 0.94 mg/dL (ref 0.76–1.27)
Glucose: 143 mg/dL — ABNORMAL HIGH (ref 70–99)
Potassium: 4.5 mmol/L (ref 3.5–5.2)
Sodium: 138 mmol/L (ref 134–144)
eGFR: 92 mL/min/{1.73_m2} (ref >59)

## 2021-02-02 LAB — CBC WITH DIFFERENTIAL/PLATELET
Basophils Absolute: 0.1 10*3/uL (ref 0.0–0.2)
Basos: 1 %
EOS (ABSOLUTE): 0.2 10*3/uL (ref 0.0–0.4)
Eos: 1 %
Hematocrit: 39.8 % (ref 37.5–51.0)
Hemoglobin: 13.2 g/dL (ref 13.0–17.7)
Immature Grans (Abs): 0.3 10*3/uL — ABNORMAL HIGH (ref 0.0–0.1)
Immature Granulocytes: 2 %
Lymphocytes Absolute: 1.5 10*3/uL (ref 0.7–3.1)
Lymphs: 9 %
MCH: 24.4 pg — ABNORMAL LOW (ref 26.6–33.0)
MCHC: 33.2 g/dL (ref 31.5–35.7)
MCV: 74 fL — ABNORMAL LOW (ref 79–97)
Monocytes Absolute: 0.6 10*3/uL (ref 0.1–0.9)
Monocytes: 4 %
Neutrophils Absolute: 14.1 10*3/uL — ABNORMAL HIGH (ref 1.4–7.0)
Neutrophils: 83 %
Platelets: 231 10*3/uL (ref 150–450)
RBC: 5.4 x10E6/uL (ref 4.14–5.80)
RDW: 15.8 % — ABNORMAL HIGH (ref 11.6–15.4)
WBC: 16.8 10*3/uL — ABNORMAL HIGH (ref 3.4–10.8)

## 2021-02-04 ENCOUNTER — Other Ambulatory Visit: Payer: Self-pay

## 2021-02-04 ENCOUNTER — Ambulatory Visit: Payer: No Typology Code available for payment source | Admitting: Internal Medicine

## 2021-02-04 ENCOUNTER — Ambulatory Visit (HOSPITAL_COMMUNITY)
Admission: RE | Admit: 2021-02-04 | Discharge: 2021-02-04 | Disposition: A | Discharge status: Home / Self Care | Payer: Self-pay | Attending: Internal Medicine | Admitting: Internal Medicine

## 2021-02-04 ENCOUNTER — Ambulatory Visit (HOSPITAL_COMMUNITY)
Admission: RE | Disposition: A | Discharge status: Home / Self Care | Payer: Self-pay | Source: Home / Self Care | Attending: Internal Medicine

## 2021-02-04 ENCOUNTER — Ambulatory Visit (HOSPITAL_COMMUNITY): Payer: Self-pay

## 2021-02-04 DIAGNOSIS — I5022 Chronic systolic (congestive) heart failure: Secondary | ICD-10-CM | POA: Insufficient documentation

## 2021-02-04 DIAGNOSIS — I472 Ventricular tachycardia, unspecified: Secondary | ICD-10-CM | POA: Insufficient documentation

## 2021-02-04 DIAGNOSIS — I110 Hypertensive heart disease with heart failure: Secondary | ICD-10-CM | POA: Insufficient documentation

## 2021-02-04 DIAGNOSIS — E785 Hyperlipidemia, unspecified: Secondary | ICD-10-CM | POA: Insufficient documentation

## 2021-02-04 DIAGNOSIS — Z7982 Long term (current) use of aspirin: Secondary | ICD-10-CM | POA: Insufficient documentation

## 2021-02-04 DIAGNOSIS — Z7984 Long term (current) use of oral hypoglycemic drugs: Secondary | ICD-10-CM | POA: Insufficient documentation

## 2021-02-04 DIAGNOSIS — Z79899 Other long term (current) drug therapy: Secondary | ICD-10-CM | POA: Insufficient documentation

## 2021-02-04 DIAGNOSIS — Z95810 Presence of automatic (implantable) cardiac defibrillator: Secondary | ICD-10-CM

## 2021-02-04 DIAGNOSIS — Z7902 Long term (current) use of antithrombotics/antiplatelets: Secondary | ICD-10-CM | POA: Insufficient documentation

## 2021-02-04 DIAGNOSIS — I255 Ischemic cardiomyopathy: Secondary | ICD-10-CM | POA: Insufficient documentation

## 2021-02-04 DIAGNOSIS — Z955 Presence of coronary angioplasty implant and graft: Secondary | ICD-10-CM | POA: Insufficient documentation

## 2021-02-04 DIAGNOSIS — Z87891 Personal history of nicotine dependence: Secondary | ICD-10-CM | POA: Insufficient documentation

## 2021-02-04 DIAGNOSIS — I2510 Atherosclerotic heart disease of native coronary artery without angina pectoris: Secondary | ICD-10-CM | POA: Insufficient documentation

## 2021-02-04 HISTORY — PX: ICD IMPLANT: EP1208

## 2021-02-04 LAB — GLUCOSE, CAPILLARY
Glucose-Capillary: 114 mg/dL — ABNORMAL HIGH (ref 70–99)
Glucose-Capillary: 96 mg/dL (ref 70–99)

## 2021-02-04 SURGERY — ICD IMPLANT

## 2021-02-04 MED ORDER — MIDAZOLAM HCL 5 MG/5ML IJ SOLN
INTRAMUSCULAR | Status: AC
  Filled 2021-02-04: qty 5

## 2021-02-04 MED ORDER — FENTANYL CITRATE (PF) 100 MCG/2ML IJ SOLN
INTRAMUSCULAR | Status: AC
  Filled 2021-02-04: qty 2

## 2021-02-04 MED ORDER — ACETAMINOPHEN 325 MG PO TABS: 325.0000 mg | ORAL_TABLET | ORAL | Status: DC | PRN

## 2021-02-04 MED ORDER — LIDOCAINE HCL (PF) 1 % IJ SOLN
INTRAMUSCULAR | Status: DC | PRN
  Administered 2021-02-04: 50 mL

## 2021-02-04 MED ORDER — MIDAZOLAM HCL 5 MG/5ML IJ SOLN
INTRAMUSCULAR | Status: DC | PRN
  Administered 2021-02-04 (×2): 2 mg via INTRAVENOUS
  Administered 2021-02-04 (×2): 1 mg via INTRAVENOUS

## 2021-02-04 MED ORDER — FENTANYL CITRATE (PF) 100 MCG/2ML IJ SOLN
INTRAMUSCULAR | Status: DC | PRN
  Administered 2021-02-04 (×2): 25 ug via INTRAVENOUS

## 2021-02-04 MED ORDER — SODIUM CHLORIDE 0.9 % IV SOLN: INTRAVENOUS | Status: DC

## 2021-02-04 MED ORDER — HEPARIN (PORCINE) IN NACL 1000-0.9 UT/500ML-% IV SOLN
INTRAVENOUS | Status: DC | PRN
  Administered 2021-02-04: 500 mL

## 2021-02-04 MED ORDER — CHLORHEXIDINE GLUCONATE 4 % EX LIQD
4.0000 "application " | Freq: Once | CUTANEOUS | Status: DC
  Filled 2021-02-04: qty 60

## 2021-02-04 MED ORDER — SODIUM CHLORIDE 0.9 % IV SOLN
INTRAVENOUS | Status: AC
  Filled 2021-02-04: qty 2

## 2021-02-04 MED ORDER — LIDOCAINE HCL 1 % IJ SOLN
INTRAMUSCULAR | Status: AC
  Filled 2021-02-04: qty 60

## 2021-02-04 MED ORDER — CEFAZOLIN SODIUM-DEXTROSE 2-4 GM/100ML-% IV SOLN
INTRAVENOUS | Status: AC
  Filled 2021-02-04: qty 100

## 2021-02-04 MED ORDER — HEPARIN (PORCINE) IN NACL 1000-0.9 UT/500ML-% IV SOLN
INTRAVENOUS | Status: AC
  Filled 2021-02-04: qty 500

## 2021-02-04 MED ORDER — CEFAZOLIN SODIUM-DEXTROSE 2-4 GM/100ML-% IV SOLN
2.0000 g | INTRAVENOUS | Status: AC
  Administered 2021-02-04: 2 g via INTRAVENOUS
  Filled 2021-02-04: qty 100

## 2021-02-04 MED ORDER — CEFAZOLIN SODIUM-DEXTROSE 1-4 GM/50ML-% IV SOLN
1.0000 g | Freq: Once | INTRAVENOUS | Status: AC
  Administered 2021-02-04: 1 g via INTRAVENOUS
  Filled 2021-02-04 (×2): qty 50

## 2021-02-04 MED ORDER — ONDANSETRON HCL 4 MG/2ML IJ SOLN: 4.0000 mg | Freq: Four times a day (QID) | INTRAMUSCULAR | Status: DC | PRN

## 2021-02-04 MED ORDER — POVIDONE-IODINE 10 % EX SWAB
2.0000 "application " | Freq: Once | CUTANEOUS | Status: AC
  Administered 2021-02-04: 2 via TOPICAL

## 2021-02-04 MED ORDER — SODIUM CHLORIDE 0.9 % IV SOLN
80.0000 mg | INTRAVENOUS | Status: AC
  Administered 2021-02-04: 80 mg
  Filled 2021-02-04: qty 2

## 2021-02-04 SURGICAL SUPPLY — 6 items
CABLE SURGICAL S-101-97-12 (CABLE) ×2 IMPLANT
ICD MOMENTUM D120 (ICD Generator) ×2 IMPLANT
LEAD RELIANCE 0138-64 (Lead) ×2 IMPLANT
PAD PRO RADIOLUCENT 2001M-C (PAD) ×2 IMPLANT
SHEATH 9FR PRELUDE SNAP 13 (SHEATH) ×2 IMPLANT
TRAY PACEMAKER INSERTION (PACKS) ×2 IMPLANT

## 2021-02-04 NOTE — Discharge Instructions (Signed)
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 After 7 days, you may progress as below.  You should remove your sling 24 hours after your procedure, unless otherwise instructed by your provider.     Monday February 11, 2021  Tuesday February 12, 2021 Wednesday February 13, 2021 Thursday February 14, 2021   Do not lift, push, pull, or carry anything over 10 pounds with the affected arm until 6 weeks (Monday March 18, 2021 ) after your procedure.   You may drive AFTER your wound check, unless you have been told otherwise by your provider.   Ask your healthcare provider when you can go back to work   INCISION/Dressing If you are on a blood thinner such as Coumadin, Xarelto, Eliquis, Plavix, or Pradaxa please confirm with your provider when this should be resumed.  If large square, outer bandage is left in place, this can be removed after 24 hours from your procedure. Do not remove steri-strips or glue as below.   Monitor your defibrillator site for redness, swelling, and drainage. Call the device clinic at [phone] if you experience these symptoms or fever/chills.  If your incision is sealed with Steri-strips or staples, you may shower 10 days after your procedure or when told by your provider. Do not remove the steri-strips or let the shower hit directly on your site. You may wash around your site with soap and water.    If you were discharged in a sling, please do not wear this during the day more than 48 hours after your surgery unless otherwise instructed. This may increase the risk of stiffness and soreness in your shoulder.   Avoid lotions, ointments, or perfumes over your incision until it is well-healed.  You may use a hot tub or a pool AFTER your wound check appointment if the incision is completely closed.  Your ICD is designed to protect you from  life threatening heart rhythms. Because of this, you may receive a shock.   1 shock with no symptoms:  Call the office during business hours. 1 shock with symptoms (chest pain, chest pressure, dizziness, lightheadedness, shortness of breath, overall feeling unwell):  Call 911. If you experience 2 or more shocks in 24 hours:  Call 911. If you receive a shock, you should not drive for 6 months per the [place] DMV IF you receive appropriate therapy from your ICD.   ICD Alerts:  Some alerts are vibratory and others beep. These are NOT emergencies. Please call our office to let us know. If this occurs at night or on weekends, it can wait until the next business day. Send a remote transmission.  If your device is capable of reading fluid status (for heart failure), you will be offered monthly monitoring to review this with you.   DEVICE MANAGEMENT Remote monitoring is used to monitor your ICD from home. This monitoring is scheduled every 91 days by our office. It allows [place] to keep an eye on the functioning of your device to ensure it is working properly. You will routinely see your Electrophysiologist annually (more often if necessary).   You should receive your ID card for your new device in 4-8 weeks. Keep this card with you at all times once received. Consider wearing a medical alert bracelet or necklace.  Your ICD  may be MRI compatible. This will be discussed at your next  office visit/wound check.  You should avoid contact with strong electric or magnetic fields.   Do not use amateur (ham) radio equipment or electric (arc) welding torches. MP3 player headphones with magnets should not be used. Some devices are safe to use if held at least 12 inches (30 cm) from your defibrillator. These include power tools, lawn mowers, and speakers. If you are unsure if something is safe to use, ask your health care provider.  When using your cell phone, hold it to the ear that is on the opposite side from the  defibrillator. Do not leave your cell phone in a pocket over the defibrillator.  You may safely use electric blankets, heating pads, computers, and microwave ovens.  Call the office right away if: You have chest pain. You feel more than one shock. You feel more short of breath than you have felt before. You feel more light-headed than you have felt before. Your incision starts to open up.  This information is not intended to replace advice given to you by your health care provider. Make sure you discuss any questions you have with your health care provider.

## 2021-02-04 NOTE — Interval H&P Note (Signed)
History and Physical Interval Note:  02/04/2021 11:49 AM  [name]  has presented today for surgery, with the diagnosis of cardiomyopathy.  The various methods of treatment have been discussed with the patient and family. After consideration of risks, benefits and other options for treatment, the patient has consented to  Procedure(s): ICD IMPLANT (N/A) as a surgical intervention.  The patient's history has been reviewed, patient examined, no change in status, stable for surgery.  I have reviewed the patient's chart and labs.  Questions were answered to the patient's satisfaction.     Cristopher Peru

## 2021-02-04 NOTE — H&P (Signed)
I have seen [name] is a 60 y.o. male in the office today who has been referred by Dr. Angelena Form for consideration of ICD implant for secondary prevention of sudden death.  The patient's chart has been reviewed and they meet criteria for ICD implant.  I have had a thorough discussion with the patient reviewing options.  The patient and their family (if available) have had opportunities to ask questions and have them answered. The patient and I have decided together through a shared decision making process to proceed with ICD at this time.  Risks, benefits, alternatives to ICD implantation were discussed in detail with the patient today. The patient  understands that the risks include but are not limited to bleeding, infection, pneumothorax, perforation, tamponade, vascular damage, renal failure, MI, stroke, death, inappropriate shocks, and lead dislodgement and he wishes to proceed.  We will therefore schedule device implantation at the next available time.   Carleene Overlie Damiya Sandefur,MD

## 2021-02-05 ENCOUNTER — Telehealth: Payer: Self-pay

## 2021-02-05 ENCOUNTER — Ambulatory Visit: Payer: No Typology Code available for payment source | Admitting: Internal Medicine

## 2021-02-05 ENCOUNTER — Encounter (HOSPITAL_COMMUNITY): Payer: Self-pay | Admitting: Internal Medicine

## 2021-02-05 MED FILL — Lidocaine HCl Local Inj 1%: INTRAMUSCULAR | Qty: 50 | Status: AC

## 2021-02-05 NOTE — Telephone Encounter (Signed)
Follow-up after same day discharge: Implant date: 02/04/21 MD: Cristopher Peru, MD Device: [place] EL ICD Location: Left Chest  Wound check visit: 02/14/21 at 10:00 90 day MD follow-up: 05/10/20 at 3:30  Remote Transmission received:yes  Dressing removed: yes  Successful telephone encounter to patient and daughter to follow up s/p ICD implant 02/04/21. Patient c/o minimal discomfort however states he has mild swelling at the site. Described as much smaller than a golf ball. No bleeding redness noted. Patient encouraged to utilize ice pack for swelling and discomfort. Instructed to remove sling and continue to follow discharge instructions for arm mobility. Confirmed wound check and 90 day physician follow up appointments and provided patient and daughter direct contact for device clinic. No further questions or concerns.

## 2021-02-05 NOTE — Telephone Encounter (Signed)
The patient daughter states the patient is feeling some swelling from his ICD implant yesterday. She would like for the nurse to give them a call back.

## 2021-02-11 ENCOUNTER — Encounter (HOSPITAL_COMMUNITY): Payer: Self-pay | Admitting: Emergency Medicine

## 2021-02-11 ENCOUNTER — Encounter (HOSPITAL_COMMUNITY)
Admission: EM | Disposition: A | Discharge status: Home / Self Care | Payer: Self-pay | Source: Home / Self Care | Attending: Emergency Medicine

## 2021-02-11 ENCOUNTER — Encounter (HOSPITAL_COMMUNITY): Payer: Self-pay | Admitting: Anesthesiology

## 2021-02-11 ENCOUNTER — Telehealth: Payer: Self-pay

## 2021-02-11 ENCOUNTER — Ambulatory Visit: Payer: No Typology Code available for payment source | Admitting: Internal Medicine

## 2021-02-11 ENCOUNTER — Other Ambulatory Visit: Payer: Self-pay

## 2021-02-11 ENCOUNTER — Ambulatory Visit (HOSPITAL_COMMUNITY)
Admission: EM | Admit: 2021-02-11 | Discharge: 2021-02-11 | Disposition: A | Discharge status: Home / Self Care | Payer: No Typology Code available for payment source | Attending: Emergency Medicine | Admitting: Emergency Medicine

## 2021-02-11 DIAGNOSIS — Z7951 Long term (current) use of inhaled steroids: Secondary | ICD-10-CM | POA: Insufficient documentation

## 2021-02-11 DIAGNOSIS — Z833 Family history of diabetes mellitus: Secondary | ICD-10-CM | POA: Insufficient documentation

## 2021-02-11 DIAGNOSIS — I110 Hypertensive heart disease with heart failure: Secondary | ICD-10-CM | POA: Insufficient documentation

## 2021-02-11 DIAGNOSIS — T18128A Food in esophagus causing other injury, initial encounter: Secondary | ICD-10-CM

## 2021-02-11 DIAGNOSIS — K222 Esophageal obstruction: Secondary | ICD-10-CM

## 2021-02-11 DIAGNOSIS — Z7984 Long term (current) use of oral hypoglycemic drugs: Secondary | ICD-10-CM | POA: Insufficient documentation

## 2021-02-11 DIAGNOSIS — Z20822 Contact with and (suspected) exposure to covid-19: Secondary | ICD-10-CM | POA: Insufficient documentation

## 2021-02-11 DIAGNOSIS — Z5309 Procedure and treatment not carried out because of other contraindication: Secondary | ICD-10-CM | POA: Insufficient documentation

## 2021-02-11 DIAGNOSIS — Z8249 Family history of ischemic heart disease and other diseases of the circulatory system: Secondary | ICD-10-CM | POA: Insufficient documentation

## 2021-02-11 DIAGNOSIS — I252 Old myocardial infarction: Secondary | ICD-10-CM | POA: Insufficient documentation

## 2021-02-11 DIAGNOSIS — I5020 Unspecified systolic (congestive) heart failure: Secondary | ICD-10-CM | POA: Insufficient documentation

## 2021-02-11 DIAGNOSIS — I2510 Atherosclerotic heart disease of native coronary artery without angina pectoris: Secondary | ICD-10-CM | POA: Insufficient documentation

## 2021-02-11 DIAGNOSIS — Z955 Presence of coronary angioplasty implant and graft: Secondary | ICD-10-CM | POA: Insufficient documentation

## 2021-02-11 DIAGNOSIS — X58XXXA Exposure to other specified factors, initial encounter: Secondary | ICD-10-CM | POA: Insufficient documentation

## 2021-02-11 DIAGNOSIS — Z7982 Long term (current) use of aspirin: Secondary | ICD-10-CM | POA: Insufficient documentation

## 2021-02-11 DIAGNOSIS — K2100 Gastro-esophageal reflux disease with esophagitis, without bleeding: Secondary | ICD-10-CM | POA: Insufficient documentation

## 2021-02-11 DIAGNOSIS — Z95810 Presence of automatic (implantable) cardiac defibrillator: Secondary | ICD-10-CM | POA: Insufficient documentation

## 2021-02-11 DIAGNOSIS — Z87891 Personal history of nicotine dependence: Secondary | ICD-10-CM | POA: Insufficient documentation

## 2021-02-11 DIAGNOSIS — Z7902 Long term (current) use of antithrombotics/antiplatelets: Secondary | ICD-10-CM

## 2021-02-11 DIAGNOSIS — E119 Type 2 diabetes mellitus without complications: Secondary | ICD-10-CM | POA: Insufficient documentation

## 2021-02-11 DIAGNOSIS — J449 Chronic obstructive pulmonary disease, unspecified: Secondary | ICD-10-CM | POA: Insufficient documentation

## 2021-02-11 DIAGNOSIS — I255 Ischemic cardiomyopathy: Secondary | ICD-10-CM | POA: Insufficient documentation

## 2021-02-11 DIAGNOSIS — Z7952 Long term (current) use of systemic steroids: Secondary | ICD-10-CM | POA: Insufficient documentation

## 2021-02-11 LAB — BASIC METABOLIC PANEL
Anion gap: 11 (ref 5–15)
BUN: 17 mg/dL (ref 8–23)
CO2: 25 mmol/L (ref 22–32)
Calcium: 9.4 mg/dL (ref 8.9–10.3)
Chloride: 105 mmol/L (ref 98–111)
Creatinine, Ser: 0.92 mg/dL (ref 0.61–1.24)
GFR, Estimated: >60 mL/min (ref >60)
Glucose, Bld: 97 mg/dL (ref 70–99)
Potassium: 4.1 mmol/L (ref 3.5–5.1)
Sodium: 141 mmol/L (ref 135–145)

## 2021-02-11 LAB — RESP PANEL BY RT-PCR (FLU A&B, COVID) ARPGX2
Influenza A by PCR: NEGATIVE
Influenza B by PCR: NEGATIVE
SARS Coronavirus 2 by RT PCR: NEGATIVE

## 2021-02-11 LAB — CBC WITH DIFFERENTIAL/PLATELET
Abs Immature Granulocytes: 0.27 10*3/uL — ABNORMAL HIGH (ref 0.00–0.07)
Basophils Absolute: 0.1 10*3/uL (ref 0.0–0.1)
Basophils Relative: 1 %
Eosinophils Absolute: 0.7 10*3/uL — ABNORMAL HIGH (ref 0.0–0.5)
Eosinophils Relative: 4 %
HCT: 45.6 % (ref 39.0–52.0)
Hemoglobin: 14.1 g/dL (ref 13.0–17.0)
Immature Granulocytes: 2 %
Lymphocytes Relative: 21 %
Lymphs Abs: 3.3 10*3/uL (ref 0.7–4.0)
MCH: 23.7 pg — ABNORMAL LOW (ref 26.0–34.0)
MCHC: 30.9 g/dL (ref 30.0–36.0)
MCV: 76.6 fL — ABNORMAL LOW (ref 80.0–100.0)
Monocytes Absolute: 1.2 10*3/uL — ABNORMAL HIGH (ref 0.1–1.0)
Monocytes Relative: 8 %
Neutro Abs: 10.2 10*3/uL — ABNORMAL HIGH (ref 1.7–7.7)
Neutrophils Relative %: 64 %
Platelets: 145 10*3/uL — ABNORMAL LOW (ref 150–400)
RBC: 5.95 MIL/uL — ABNORMAL HIGH (ref 4.22–5.81)
RDW: 15.3 % (ref 11.5–15.5)
WBC: 15.7 10*3/uL — ABNORMAL HIGH (ref 4.0–10.5)
nRBC: 0 % (ref 0.0–0.2)

## 2021-02-11 SURGERY — CANCELLED PROCEDURE

## 2021-02-11 MED ORDER — SODIUM CHLORIDE 0.9 % IV SOLN: INTRAVENOUS | Status: DC

## 2021-02-11 MED ORDER — PANTOPRAZOLE SODIUM 40 MG PO TBEC: 40.0000 mg | DELAYED_RELEASE_TABLET | Freq: Every day | ORAL | 1 refills | Status: DC

## 2021-02-11 NOTE — ED Provider Notes (Signed)
[facility] EMERGENCY DEPARTMENT Provider Note   CSN: [id number] Arrival date & time: 02/11/21  0003     History Chief Complaint  Patient presents with   food stuck in throat    [name] is a 60 y.o. male.  HPI Patient reports onset of symptoms of meat stuck in his throat, since 8 PM last night.  Since that time he has not been able to eat or drink without having to spit things out.  He has a sensation of something stuck in the upper part of his neck.  He denies shortness of breath, cough, weakness or dizziness.  He had pacemaker placed, 1 week ago and reports that the incision site is doing well.  He denies palpitations, syncope, chest pain.  There are no other known active modifying factors    Past Medical History:  Diagnosis Date   Asthma    CAD (coronary artery disease)    a. CAD s/p anterior MI in 2008 treated with DES to the LAD.   Cataract 2018   both eyes   Cellulitis of leg, right 10/2014   COPD (chronic obstructive pulmonary disease) (HCC)    Dental caries    Diabetes mellitus    TYPE 2   GERD (gastroesophageal reflux disease)    Hyperlipidemia    Hypertension 2017   Ischemic cardiomyopathy    Leukocytosis    noted on labs   Microcytic anemia    Myocardial infarction [facility]) 2008   Reactive airway disease    Thyroid nodule    biopsy negative 2006    Patient Active Problem List   Diagnosis Date Noted   HFrEF (heart failure with reduced ejection fraction) ([place]) 09/24/2020   Non-ST elevation (NSTEMI) myocardial infarction ([place]) 09/22/2020   Ventricular tachycardia 09/20/2020   Current chronic use of systemic steroids 08/07/2020   Poor dentition 02/18/2020   Former smoker 01/13/2020   Syncope and collapse    Microcytic anemia - 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 05/18/2019   GERD (gastroesophageal reflux disease) 10/26/2017   Mixed hyperlipidemia 07/13/2017   Primary hypertension 04/22/2017   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/18/2014    Cataracts, bilateral 06/27/2014   Cardiomyopathy, ischemic 11/11/2011   Type 2 diabetes mellitus with complication, without long-term current use of insulin ([place]) 06/12/2009   CAD (coronary artery disease) 07/13/2008   History of MI (myocardial infarction) 04/01/2007   Sinusitis, chronic 06/18/2006    Past Surgical History:  Procedure Laterality Date   CORONARY STENT INTERVENTION N/A 09/20/2020   Procedure: CORONARY STENT INTERVENTION;  Surgeon: Lorretta Harp, MD;  Location: [facility];  Service: Cardiovascular;  Laterality: N/A;   CORONARY STENT PLACEMENT     ESOPHAGOGASTRODUODENOSCOPY N/A 03/29/2017   Procedure: ESOPHAGOGASTRODUODENOSCOPY (EGD);  Surgeon: Milus Banister, MD;  Location: [facility] ENDOSCOPY;  Service: Endoscopy;  Laterality: N/A;   ESOPHAGOGASTRODUODENOSCOPY N/A 03/31/2018   Procedure: ESOPHAGOGASTRODUODENOSCOPY (EGD);  Surgeon: Doran Stabler, MD;  Location: [facility];  Service: Gastroenterology;  Laterality: N/A;   FOREIGN BODY REMOVAL N/A 03/31/2018   Procedure: FOREIGN BODY REMOVAL;  Surgeon: Doran Stabler, MD;  Location: [facility];  Service: Gastroenterology;  Laterality: N/A;   ICD IMPLANT N/A 02/04/2021   Procedure: ICD IMPLANT;  Surgeon: Evans Lance, MD;  Location: [facility] CV LAB;  Service: Cardiovascular;  Laterality: N/A;   LEFT HEART CATH AND CORONARY ANGIOGRAPHY N/A 09/20/2020   Procedure: LEFT HEART CATH AND CORONARY ANGIOGRAPHY;  Surgeon: Lorretta Harp,  MD;  Location: [facility] CV LAB;  Service: Cardiovascular;  Laterality: N/A;   solitary pulm and thryoid nodules         Family History  Problem Relation Age of Onset   Diabetes Mother    Heart attack Brother 33   Colon cancer Neg Hx    Esophageal cancer Neg Hx    Rectal cancer Neg Hx    Stomach cancer Neg Hx     Social History   Tobacco Use   Smoking status: Former    Packs/day: 0.50    Years: 22.00    Pack years: 11.00    Types: Cigarettes    Quit date:  04/21/2006    Years since quitting: 14.8   Smokeless tobacco: Never   Tobacco comments:    quit 10 years ago  Vaping Use   Vaping Use: Never used  Substance Use Topics   Alcohol use: No    Alcohol/week: 0.0 standard drinks   Drug use: No    Home Medications Prior to Admission medications   Medication Sig Start Date End Date Taking? Authorizing Provider  acetaminophen (TYLENOL) 500 MG tablet Take 2 tablets (1,000 mg total) by mouth every 8 (eight) hours. Patient not taking: Reported on 02/01/2021 04/11/20   Daisy Floro, DO  ADVAIR HFA 230-21 MCG/ACT inhaler INHALE 2 PUFFS INTO THE LUNGS 2 TIMES A DAY. 09/10/20   Tanda Rockers, MD  aspirin EC 81 MG tablet Take 1 tablet (81 mg total) by mouth daily. 11/14/20   Burnell Blanks, MD  atorvastatin (LIPITOR) 80 MG tablet Take 1 tablet (80 mg total) by mouth at bedtime. 01/28/21   Burnell Blanks, MD  bisoprolol (ZEBETA) 5 MG tablet Take 0.5 tablets (2.5 mg total) by mouth daily. 11/16/20   Burnell Blanks, MD  calcium-vitamin D (OSCAL WITH D) 250-125 MG-UNIT tablet Take 1 tablet by mouth 2 (two) times daily.    [provider]  clopidogrel (PLAVIX) 75 MG tablet Take 1 tablet (75 mg total) by mouth daily with breakfast. 01/28/21   Burnell Blanks, MD  FARXIGA 10 MG TABS tablet TAKE 1 TABLET (10MG  TOTAL) BY MOUTH DAILY. Patient taking differently: Take 10 mg by mouth daily in the afternoon. 10/24/20   Maness, Arnette Norris, MD  ferrous sulfate 325 (65 FE) MG tablet Take 325 mg by mouth daily with breakfast.    [provider]  metFORMIN (GLUCOPHAGE) 1000 MG tablet Take 1 tablet (1,000 mg total) by mouth 2 (two) times daily with a meal. 10/18/20   Burnell Blanks, MD  Multiple Vitamin (MULTIVITAMIN WITH MINERALS) TABS tablet Take 1 tablet by mouth daily.    [provider]  nitroGLYCERIN (NITROSTAT) 0.4 MG SL tablet Place 1 tablet (0.4 mg total) under the tongue every 5 (five) minutes x 3  doses as needed for chest pain. 09/22/20   Sande Rives E, PA-C  pantoprazole (PROTONIX) 40 MG tablet Take 1 tablet (40 mg total) by mouth daily. 02/11/21   Daleen Bo, MD  predniSONE (DELTASONE) 10 MG tablet Take 1 tablet by mouth once daily with breakfast 07/27/20   Mullis, Kiersten P, DO  PROAIR HFA 108 (90 Base) MCG/ACT inhaler INHALE 2 PUFFS INTO THE LUNGS EVERY 4 HOURS AS NEEDED FOR WHEEZING OR SHORTNESS OF BREATH (PLAN B). 07/31/20   Tanda Rockers, MD  valsartan (DIOVAN) 40 MG tablet Take 1 tablet (40 mg total) by mouth daily. 11/16/20   Burnell Blanks, MD    Allergies  Patient has no known allergies.  Review of Systems   Review of Systems  All other systems reviewed and are negative.  Physical Exam Updated Vital Signs BP (!) 156/65   Pulse 66   Temp (!) 97.4 F (36.3 C) (Temporal)   Resp 16   Ht 5\' 8"  (1.727 m)   Wt 64 kg   SpO2 95%   BMI 21.44 kg/m   Physical Exam Vitals and nursing note reviewed.  Constitutional:      General: He is not in acute distress.    Appearance: He is well-developed. He is not ill-appearing.  HENT:     Head: Normocephalic and atraumatic.     Right Ear: External ear normal.     Left Ear: External ear normal.     Mouth/Throat:     Mouth: Mucous membranes are dry.     Comments: No visible lesions.  While examining the patient, he coughed up 1 to 2 ounces of saliva mixed with mucus.  This was clear without evidence for bleeding. Eyes:     Conjunctiva/sclera: Conjunctivae normal.     Pupils: Pupils are equal, round, and reactive to light.  Neck:     Trachea: Phonation normal.  Cardiovascular:     Rate and Rhythm: Normal rate and regular rhythm.     Heart sounds: Normal heart sounds.  Pulmonary:     Effort: Pulmonary effort is normal. No respiratory distress.     Breath sounds: Normal breath sounds. No stridor.  Abdominal:     Palpations: Abdomen is soft.     Tenderness: There is no abdominal tenderness.   Musculoskeletal:        General: Normal range of motion.     Cervical back: Normal range of motion and neck supple.  Skin:    General: Skin is warm and dry.  Neurological:     Mental Status: He is alert and oriented to person, place, and time.     Cranial Nerves: No cranial nerve deficit.     Sensory: No sensory deficit.     Motor: No abnormal muscle tone.     Coordination: Coordination normal.  Psychiatric:        Mood and Affect: Mood normal.        Behavior: Behavior normal.        Thought Content: Thought content normal.        Judgment: Judgment normal.    ED Results / Procedures / Treatments   Labs (all labs ordered are listed, but only abnormal results are displayed) Labs Reviewed  CBC WITH DIFFERENTIAL/PLATELET - Abnormal; Notable for the following components:      Result Value   WBC 15.7 (*)    RBC 5.95 (*)    MCV 76.6 (*)    MCH 23.7 (*)    Platelets 145 (*)    Neutro Abs 10.2 (*)    Monocytes Absolute 1.2 (*)    Eosinophils Absolute 0.7 (*)    Abs Immature Granulocytes 0.27 (*)    All other components within normal limits  RESP PANEL BY RT-PCR (FLU A&B, COVID) [id number]  BASIC METABOLIC PANEL    EKG None  Radiology No results found.  Procedures Procedures   Medications Ordered in ED Medications - No data to display  ED Course  I have reviewed the triage vital signs and the nursing notes.  Pertinent labs & imaging results that were available during my care of the patient were reviewed by me and considered in my medical  decision making (see chart for details).  Clinical Course as of 02/11/21 1255  Mon Feb 11, 2021  0809 Case discussed with gastroenterology, who is previously seen him.  They will evaluate him in the ED. [EW]    Clinical Course User Index [EW] Daleen Bo, MD   MDM Rules/Calculators/A&P                            Patient Vitals for the past 24 hrs:  BP Temp Temp src Pulse Resp SpO2 Height Weight  02/11/21 1127 (!) 156/65  (!) 97.4 F (36.3 C) Temporal 66 16 95 % 5\' 8"  (1.727 m) 64 kg  02/11/21 1100 (!) 144/71 -- -- 64 18 94 % -- --  02/11/21 1030 132/67 -- -- 61 14 96 % -- --  02/11/21 1000 130/68 -- -- (!) 54 18 95 % -- --  02/11/21 0930 136/76 -- -- (!) 56 19 95 % -- --  02/11/21 0845 140/73 -- -- (!) 59 17 98 % -- --  02/11/21 0830 (!) 146/75 -- -- 62 17 98 % -- --  02/11/21 0814 (!) 169/72 -- -- 63 20 100 % -- --  02/11/21 0415 133/70 -- -- 66 18 96 % -- --  02/11/21 0024 (!) 170/89 98.8 F (37.1 C) -- 80 18 95 % -- --    12:55 PM Reevaluation with update and discussion. After initial assessment and treatment, an updated evaluation reveals he returned from endoscopy where he had passage of the food bolus, spontaneously.  He did not undergo endoscopy.  The GI physician, Dr. Elenor Quinones came here and discussed the case with me.  Patient is to be discharged to restart PPI and follow-up with his usual gastroenterologist.  Patient is currently comfortable and has no further complaints.  Findings discussed and questions answered. Daleen Bo   Medical Decision Making:  This patient is presenting for evaluation of food stuck in throat, which does require a range of treatment options, and is a complaint that involves a moderate risk of morbidity and mortality. The differential diagnoses include esophageal food impaction, recurrent fungal candidiasis of the esophagus, nonspecific swallowing disorder. I decided to review old records, and in summary elderly male presenting with sensation of food foreign body which will not pass.  He is producing some saliva during examination, that he is unable to swallow.  He had a pacemaker placed 1 week ago, and is recovering well from that.  He has no other acute findings pertinent to this incident.  I did not require additional historical information from anyone.  Clinical Laboratory Tests Ordered, included CBC, Metabolic panel, and viral panel . Review indicates normal except  white count high.     Critical Interventions-clinical evaluation, laboratory testing, discussion with gastroenterology, observation  After These Interventions, the Patient was reevaluated and was found improved with spontaneous resolution of esophageal impaction.  CRITICAL CARE-no Performed by: Daleen Bo  Nursing Notes Reviewed/ Care Coordinated Applicable Imaging Reviewed Interpretation of Laboratory Data incorporated into ED treatment  The patient appears reasonably screened and/or stabilized for discharge and I doubt any other medical condition or other [facility] requiring further screening, evaluation, or treatment in the ED at this time prior to discharge.  Plan: Home Medications-restart pantoprazole, continue usual; Home Treatments-gradual advance diet; return here if the recommended treatment, does not improve the symptoms; Recommended follow up-GI follow-up for further care and treatment.     Final Clinical Impression(s) / ED Diagnoses  Final diagnoses:  Esophageal obstruction due to food impaction    Rx / DC Orders ED Discharge Orders          Ordered    pantoprazole (PROTONIX) 40 MG tablet  Daily        02/11/21 1252             Daleen Bo, MD 02/11/21 1845

## 2021-02-11 NOTE — Telephone Encounter (Signed)
-----   Message from Levin Erp, [place] sent at 02/11/2021 12:56 PM EDT ----- Regarding: Appt Can you get patient an appt with me in clinic to set up repeat hosp EGD with dilation with Dr. Ardis Hughs?  Thanks! JLL

## 2021-02-11 NOTE — Consult Note (Addendum)
Consultation  Referring Provider:   Dr. Eulis Foster Primary Care Physician:  Dickie La, MD Primary Gastroenterologist: Dr. Ardis Hughs       Reason for Consultation:   Food impaction         HPI:   [name] is a 60 y.o. male with a past medical history of CAD status post stent in 2008 and repeated 09/20/2020 on Plavix, also flagged for difficult airway, ischemic cardiomyopathy with chronic heart failure, status post ICD implantation 02/04/2021, LVEF at that time 30%, COPD, GERD and multiple others listed below, who presented to the ER in 02/10/2021 with a piece of "meat stuck in his throat".    Today, patient explains that around 8:00 last night he took some bites of ground beef and immediately felt that these got stuck in his throat on the way down.  He tried to drink some soda and some salt water and various things but everything just came right back up and since that time he has not been able to handle his own secretions with multiple episodes of spitting mucus and saliva.  He does feel some discomfort at the upper portion of his neck but no abdominal pain.  Tells me he had to come off of his Omeprazole for 2 days when his pacemaker was placed and he thinks that is what caused this issue as he was perfectly fine before then.    Reminds me of his history of a pacemaker placed 1 week ago.  He is currently on Plavix over the last 2 days.    Denies fever, chills, abdominal pain, nausea or symptoms that awaken him from sleep.     ER course: Respiratory panel including COVID is pending, CBC and BMP pending  GI history: 05/31/2018 colonoscopy with 3 tubular adenomas removed from the ascending colon and cecum, repeat recommended in 3 years 05/18/2018 office visit with me for preprocedure visit for screening colonoscopy, at that time scheduled for screening colonoscopy in the [facility], he was on Plavix and had to hold this for 5 days 03/31/2018 consult in the hospital for food impaction-EGD with esophagitis,  food impaction removed  Past Medical History:  Diagnosis Date  . Asthma   . CAD (coronary artery disease)    a. CAD s/p anterior MI in 2008 treated with DES to the LAD.  Marland Kitchen Cataract 2018   both eyes  . Cellulitis of leg, right 10/2014  . COPD (chronic obstructive pulmonary disease) ([place])   . Dental caries   . Diabetes mellitus    TYPE 2  . GERD (gastroesophageal reflux disease)   . Hyperlipidemia   . Hypertension 2017  . Ischemic cardiomyopathy   . Leukocytosis    noted on labs  . Microcytic anemia   . Myocardial infarction ([place]) 2008  . Reactive airway disease   . Thyroid nodule    biopsy negative 2006    Past Surgical History:  Procedure Laterality Date  . CORONARY STENT INTERVENTION N/A 09/20/2020   Procedure: CORONARY STENT INTERVENTION;  Surgeon: Lorretta Harp, MD;  Location: [facility];  Service: Cardiovascular;  Laterality: N/A;  . CORONARY STENT PLACEMENT    . ESOPHAGOGASTRODUODENOSCOPY N/A 03/29/2017   Procedure: ESOPHAGOGASTRODUODENOSCOPY (EGD);  Surgeon: Milus Banister, MD;  Location: [facility] ENDOSCOPY;  Service: Endoscopy;  Laterality: N/A;  . ESOPHAGOGASTRODUODENOSCOPY N/A 03/31/2018   Procedure: ESOPHAGOGASTRODUODENOSCOPY (EGD);  Surgeon: Doran Stabler, MD;  Location: [place] [facility];  Service: Gastroenterology;  Laterality: N/A;  . FOREIGN BODY  REMOVAL N/A 03/31/2018   Procedure: FOREIGN BODY REMOVAL;  Surgeon: Doran Stabler, MD;  Location: [place];  Service: Gastroenterology;  Laterality: N/A;  . ICD IMPLANT N/A 02/04/2021   Procedure: ICD IMPLANT;  Surgeon: Evans Lance, MD;  Location: [place] [facility];  Service: Cardiovascular;  Laterality: N/A;  . LEFT HEART CATH AND CORONARY ANGIOGRAPHY N/A 09/20/2020   Procedure: LEFT HEART CATH AND CORONARY ANGIOGRAPHY;  Surgeon: Lorretta Harp, MD;  Location: [facility];  Service: Cardiovascular;  Laterality: N/A;  . solitary pulm and thryoid nodules      Family History  Problem  Relation Age of Onset  . Diabetes Mother   . Heart attack Brother 25  . Colon cancer Neg Hx   . Esophageal cancer Neg Hx   . Rectal cancer Neg Hx   . Stomach cancer Neg Hx     Social History   Tobacco Use  . Smoking status: Former    Packs/day: 0.50    Years: 22.00    Pack years: 11.00    Types: Cigarettes    Quit date: 04/21/2006    Years since quitting: 14.8  . Smokeless tobacco: Never  . Tobacco comments:    quit 10 years ago  Vaping Use  . Vaping Use: Never used  Substance Use Topics  . Alcohol use: No    Alcohol/week: 0.0 standard drinks  . Drug use: No    Prior to Admission medications   Medication Sig Start Date End Date Taking? Authorizing Provider  acetaminophen (TYLENOL) 500 MG tablet Take 2 tablets (1,000 mg total) by mouth every 8 (eight) hours. Patient not taking: Reported on 02/01/2021 04/11/20   Daisy Floro, DO  ADVAIR HFA 230-21 MCG/ACT inhaler INHALE 2 PUFFS INTO THE LUNGS 2 TIMES A DAY. 09/10/20   Tanda Rockers, MD  aspirin EC 81 MG tablet Take 1 tablet (81 mg total) by mouth daily. 11/14/20   Burnell Blanks, MD  atorvastatin (LIPITOR) 80 MG tablet Take 1 tablet (80 mg total) by mouth at bedtime. 01/28/21   Burnell Blanks, MD  bisoprolol (ZEBETA) 5 MG tablet Take 0.5 tablets (2.5 mg total) by mouth daily. 11/16/20   Burnell Blanks, MD  calcium-vitamin D (OSCAL WITH D) 250-125 MG-UNIT tablet Take 1 tablet by mouth 2 (two) times daily.    [provider]  clopidogrel (PLAVIX) 75 MG tablet Take 1 tablet (75 mg total) by mouth daily with breakfast. 01/28/21   Burnell Blanks, MD  FARXIGA 10 MG TABS tablet TAKE 1 TABLET (10MG  TOTAL) BY MOUTH DAILY. Patient taking differently: Take 10 mg by mouth daily in the afternoon. 10/24/20   Maness, Arnette Norris, MD  ferrous sulfate 325 (65 FE) MG tablet Take 325 mg by mouth daily with breakfast.    [provider]  metFORMIN (GLUCOPHAGE) 1000 MG tablet Take 1 tablet (1,000  mg total) by mouth 2 (two) times daily with a meal. 10/18/20   Burnell Blanks, MD  Multiple Vitamin (MULTIVITAMIN WITH MINERALS) TABS tablet Take 1 tablet by mouth daily.    [provider]  nitroGLYCERIN (NITROSTAT) 0.4 MG SL tablet Place 1 tablet (0.4 mg total) under the tongue every 5 (five) minutes x 3 doses as needed for chest pain. 09/22/20   Sande Rives E, PA-C  pantoprazole (PROTONIX) 20 MG tablet Take 1 tablet (20 mg total) by mouth daily. 06/30/19   Chase Picket, MD  predniSONE (DELTASONE) 10 MG tablet Take  1 tablet by mouth once daily with breakfast 07/27/20   Mullis, Kiersten P, DO  PROAIR HFA 108 (90 Base) MCG/ACT inhaler INHALE 2 PUFFS INTO THE LUNGS EVERY 4 HOURS AS NEEDED FOR WHEEZING OR SHORTNESS OF BREATH (PLAN B). 07/31/20   Tanda Rockers, MD  valsartan (DIOVAN) 40 MG tablet Take 1 tablet (40 mg total) by mouth daily. 11/16/20   Burnell Blanks, MD    No current facility-administered medications for this encounter.   Current Outpatient Medications  Medication Sig Dispense Refill  . acetaminophen (TYLENOL) 500 MG tablet Take 2 tablets (1,000 mg total) by mouth every 8 (eight) hours. (Patient not taking: Reported on 02/01/2021) 30 tablet 1  . ADVAIR HFA 230-21 MCG/ACT inhaler INHALE 2 PUFFS INTO THE LUNGS 2 TIMES A DAY. 1 each 2  . aspirin EC 81 MG tablet Take 1 tablet (81 mg total) by mouth daily. 30 tablet 11  . atorvastatin (LIPITOR) 80 MG tablet Take 1 tablet (80 mg total) by mouth at bedtime. 90 tablet 3  . bisoprolol (ZEBETA) 5 MG tablet Take 0.5 tablets (2.5 mg total) by mouth daily. 45 tablet 3  . calcium-vitamin D (OSCAL WITH D) 250-125 MG-UNIT tablet Take 1 tablet by mouth 2 (two) times daily.    . clopidogrel (PLAVIX) 75 MG tablet Take 1 tablet (75 mg total) by mouth daily with breakfast. 90 tablet 3  . FARXIGA 10 MG TABS tablet TAKE 1 TABLET (10MG  TOTAL) BY MOUTH DAILY. (Patient taking differently: Take 10 mg by mouth daily in the  afternoon.) 90 tablet 0  . ferrous sulfate 325 (65 FE) MG tablet Take 325 mg by mouth daily with breakfast.    . metFORMIN (GLUCOPHAGE) 1000 MG tablet Take 1 tablet (1,000 mg total) by mouth 2 (two) times daily with a meal. 180 tablet 1  . Multiple Vitamin (MULTIVITAMIN WITH MINERALS) TABS tablet Take 1 tablet by mouth daily.    . nitroGLYCERIN (NITROSTAT) 0.4 MG SL tablet Place 1 tablet (0.4 mg total) under the tongue every 5 (five) minutes x 3 doses as needed for chest pain. 25 tablet 2  . pantoprazole (PROTONIX) 20 MG tablet Take 1 tablet (20 mg total) by mouth daily. 30 tablet 0  . predniSONE (DELTASONE) 10 MG tablet Take 1 tablet by mouth once daily with breakfast 30 tablet 0  . PROAIR HFA 108 (90 Base) MCG/ACT inhaler INHALE 2 PUFFS INTO THE LUNGS EVERY 4 HOURS AS NEEDED FOR WHEEZING OR SHORTNESS OF BREATH (PLAN B). 8 each 5  . valsartan (DIOVAN) 40 MG tablet Take 1 tablet (40 mg total) by mouth daily. 90 tablet 3    Allergies as of 02/11/2021  . (No Known Allergies)     Review of Systems:    Constitutional: No weight loss, fever or chills Skin: No rash  Cardiovascular: No chest pain Respiratory: No SOB Gastrointestinal: See HPI and otherwise negative Genitourinary: No dysuria Neurological: No headache, dizziness or syncope Musculoskeletal: No new muscle or joint pain Hematologic: No bleeding or bruising Psychiatric: No history of depression or anxiety    Physical Exam:  Vital signs in last 24 hours: Temp:  [98.8 F (37.1 C)] 98.8 F (37.1 C) (10/24 0024) Pulse Rate:  [63-80] 63 (10/24 0814) Resp:  [18-20] 20 (10/24 0814) BP: (133-170)/(70-89) 169/72 (10/24 0814) SpO2:  [95 %-100 %] 100 % (10/24 1540)   General:   Pleasant male appears to be in NAD, Well developed, Well nourished, alert and cooperative Head:  Normocephalic and atraumatic.  Eyes:   PEERL, EOMI. No icterus. Conjunctiva pink. Ears:  Normal auditory acuity. Neck:  Supple Throat: Oral cavity and pharynx  without inflammation, swelling or lesion.  Lungs: Respirations even and unlabored. Lungs clear to auscultation bilaterally.   No wheezes, crackles, or rhonchi.  Heart: Normal S1, S2. No MRG. Regular rate and rhythm. No peripheral edema, cyanosis or pallor.  Abdomen:  Soft, nondistended, nontender. No rebound or guarding. Normal bowel sounds. No appreciable masses or hepatomegaly. Rectal:  Not performed.  Msk:  Symmetrical without gross deformities. Peripheral pulses intact.  Extremities:  Without edema, no deformity or joint abnormality.  Neurologic:  Alert and  oriented x4;  grossly normal neurologically.  Skin:   Dry and intact without significant lesions or rashes. Psychiatric: Demonstrates good judgement and reason without abnormal affect or behaviors.  Labs pending   Impression / Plan:   Impression: 1.  Food impaction: Piece of ground beef stuck since 8 PM on 02/11/2021, unable to handle secretions since then, history of food impactions in the past with the last in 2019, also history of esophageal candidiasis in the past, recently patient had not been experiencing any dysphagia or symptoms on his Omeprazole daily 2.  Ischemic cardiomyopathy status post ICD placement 02/04/2021: On Plavix for the past 2 days 3.  Difficult airway  Plan: 1.  Patient was scheduled for an EGD with removal of food impaction.  Discussed that since he is on his Plavix we will be unable to perform any biopsies or dilate.  He is at high risk for this procedure given that he recently had an ICD placed and his history of ischemic cardiomyopathy as well as a difficult airway.  Likely he will need to be scheduled for repeat EGD in the future when he is able to stop this and likely at the hospital given his history of difficult airway..  Did discuss risks, benefits, limitations and alternatives and patient agrees to proceed. 2.  Patient's COVID test is pending, labs including BMP and CBC are also pending. 3.  Patient  remain n.p.o. for now. 4.  Further recommendations after time of EGD this morning.  Thank you for your kind consultation, we will continue to follow.  Lavone Nian [facility]  02/11/2021, 8:41 AM    I have taken a history, reviewed the chart and examined the patient. I performed a substantive portion of this encounter, including complete performance of at least one of the key components, in conjunction with the APP. I agree with the APP's note, impression and recommendations  60 year old male with a history of CAD s/p stent, ischemic cardiomyopathy s/p AICD on 10/17, COPD and a history of esophageal food impaction in 2019, who presented with another food impaction.  However, since the patient was seen by PA Lemmon, the patient's impacted food bolus has resolved.  While in pre-op holding, the patient felt the sensation of pressure in his chest resolve.  He was given water and was able to swallow it without difficulty and he feels back to his baseline.  The patient can be discharged home from the ER without EGD. I recommend he continue to take a PPI once daily He will follow up with Dr. Ardis Hughs in the clinic to discuss setting up an outpatient EGD with dilation (will need to be performed in hospital setting due to cardiac comorbidities and will need to coordinate with cardiology as to when it is safe to hold his Plavix). Patient was advised to avoid chewy meats/breads and to  chew his food very carefully and slowly until his next EGD.   Reagen Goates E. Candis Schatz, MD [facility] Gastroenterology

## 2021-02-11 NOTE — Anesthesia Preprocedure Evaluation (Deleted)
Anesthesia Evaluation    Reviewed: Allergy & Precautions, Patient's Chart, lab work & pertinent test results, reviewed documented beta blocker date and time   Airway        Dental   Pulmonary asthma , COPD, former smoker,           Cardiovascular hypertension, Pt. on home beta blockers and Pt. on medications + CAD, + Past MI, + Cardiac Stents and +CHF  + Cardiac Defibrillator (s/p 01/2021)   TTE 2022 1. No apical thrombus with Definity contrast. Left ventricular ejection  fraction, by estimation, is 30 to 35%. The left ventricle demonstrates  regional wall motion abnormalities (see scoring diagram/findings for  description). There is mild left  ventricular hypertrophy. Left ventricular diastolic parameters are  consistent with Grade I diastolic dysfunction (impaired relaxation). There  is severe akinesis and thinning of the left ventricular, entire anterior  wall, anteroseptal wall, apical segment  and inferoapical segment. 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  2. The mitral valve is grossly normal. Mild mitral valve regurgitation.   Cath 2022 Prox RCA lesion is 80% stenosed. Previously placed Prox LAD to Mid LAD stent (unknown type) is widely patent. Dist Cx lesion is 100% stenosed. Prox Cx lesion is 30% stenosed. 1st Mrg lesion is 90% stenosed. 2nd Diag lesion is 90% stenosed. A drug-eluting stent was successfully placed. Post intervention, there is a 0% residual stenosis.    Neuro/Psych negative neurological ROS  negative psych ROS   GI/Hepatic Neg liver ROS, GERD  ,  Endo/Other  diabetes, Type 2, Oral Hypoglycemic Agents  Renal/GU negative Renal ROS  negative genitourinary   Musculoskeletal negative musculoskeletal ROS (+)   Abdominal   Peds  Hematology negative hematology ROS (+)   Anesthesia Other  Findings Food impaction  Reproductive/Obstetrics                             Anesthesia Physical Anesthesia Plan  ASA: 4  Anesthesia Plan: General   Post-op Pain Management:    Induction: Intravenous and Rapid sequence  PONV Risk Score and Plan: 2 and Midazolam, Dexamethasone and Ondansetron  Airway Management Planned: Oral ETT  Additional Equipment:   Intra-op Plan:   Post-operative Plan: Extubation in OR  Informed Consent: I have reviewed the patients History and Physical, chart, labs and discussed the procedure including the risks, benefits and alternatives for the proposed anesthesia with the patient or authorized representative who has indicated his/her understanding and acceptance.     Dental advisory given  Plan Discussed with: CRNA  Anesthesia Plan Comments: (Case cancelled by proceduralist 2/2 piece of food passed. )       Anesthesia Quick Evaluation

## 2021-02-11 NOTE — ED Triage Notes (Signed)
Pt from home, states he had a bite of beef tonight that is stuck in his throat.  He has hx of same and has been trying for four hours to move it.  Pt is able to speak in complete sentences and does not appear to be in any respiratory distress at this time.

## 2021-02-11 NOTE — Discharge Instructions (Signed)
It appears that the food bolus has passed.  Start taking the pantoprazole that you were previously taking.  We did send a prescription to your pharmacy where you can get it later today.

## 2021-02-11 NOTE — Telephone Encounter (Signed)
Patient has been scheduled for a follow up with Ellouise Newer, PA-C on Tuesday, 02/19/21 at 1:30 pm. [name] is aware of appt. My Chart message sent to patient with appt information.

## 2021-02-11 NOTE — ED Notes (Signed)
Pt transported to endoscopy 

## 2021-02-13 ENCOUNTER — Telehealth (HOSPITAL_COMMUNITY): Payer: Self-pay | Admitting: *Deleted

## 2021-02-13 NOTE — Telephone Encounter (Signed)
Cardiac Rehab: Virtual Visit  Patient participates in the virtual cardiac rehab program via. Contacted patient to check progress with exercise. Patient states he had an ICD implanted last Monday, and activity is restricted at the moment. Patient has a follow-up appointment with his physician tomorrow and will ask about activity progression. Will follow-up with patient next Monday.  Sol Passer, MS, ACSM CEP 02/13/2021 [phone]

## 2021-02-14 ENCOUNTER — Ambulatory Visit (INDEPENDENT_AMBULATORY_CARE_PROVIDER_SITE_OTHER): Payer: No Typology Code available for payment source

## 2021-02-14 ENCOUNTER — Other Ambulatory Visit: Payer: Self-pay

## 2021-02-14 DIAGNOSIS — I255 Ischemic cardiomyopathy: Secondary | ICD-10-CM

## 2021-02-14 LAB — CUP PACEART INCLINIC DEVICE CHECK
Brady Statistic RV Percent Paced: 1 % — CL
Date Time Interrogation Session: 20221027103025
HighPow Impedance: 69 Ohm
HighPow Impedance: 84 Ohm
Implantable Lead Implant Date: 20221017
Implantable Lead Location: 753860
Implantable Lead Model: 138
Implantable Lead Serial Number: 304145
Implantable Pulse Generator Implant Date: 20221017
Lead Channel Impedance Value: 479 Ohm
Lead Channel Pacing Threshold Amplitude: 1.1 V
Lead Channel Pacing Threshold Pulse Width: 0.4 ms
Lead Channel Sensing Intrinsic Amplitude: 20.8 mV
Lead Channel Setting Pacing Amplitude: 3.5 V
Lead Channel Setting Pacing Pulse Width: 0.4 ms
Lead Channel Setting Sensing Sensitivity: 0.5 mV
Pulse Gen Serial Number: 213143

## 2021-02-14 NOTE — Progress Notes (Signed)
Wound check appointment. Steri-strips removed. Wound without redness or edema. Incision edges approximated, wound well healed. Normal device function. Thresholds, sensing, and impedances consistent with implant measurements. Device programmed at 3.5V for extra safety margin until 3 month visit. Histogram distribution appropriate for patient and level of activity. 2 VF episodes noted, consistent with timing of implant procedure/ testing, confirmed with EMR pt was in procedure at time of episodes. Patient educated about wound care, arm mobility, lifting restrictions, shock plan. Patient is enrolled in remote monitoring, next scheduled check 05/07/21.  ROV with Dr. Lovena Le as scheduled on 05/10/21.

## 2021-02-14 NOTE — Progress Notes (Signed)
Left VM for pt advising that Dr. Lovena Le indicates he may return to work today/

## 2021-02-14 NOTE — Patient Instructions (Signed)
   After Your ICD (Implantable Cardiac Defibrillator)    Monitor your defibrillator site for redness, swelling, and drainage. Call the device clinic at [phone] if you experience these symptoms or fever/chills.  Your incision was closed with Steri-strips or staples:  You may shower 7 days after your procedure and wash your incision with soap and water. Avoid lotions, ointments, or perfumes over your incision until it is well-healed.    You may use a hot tub or a pool after your wound check appointment if the incision is completely closed.  Do not lift, push or pull greater than 10 pounds with the affected arm until 6 weeks after your procedure. There are no other restrictions in arm movement after your wound check appointment.  Your ICD is MRI compatible.  Your ICD is designed to protect you from life threatening heart rhythms. Because of this, you may receive a shock.   1 shock with no symptoms:  Call the office during business hours. 1 shock with symptoms (chest pain, chest pressure, dizziness, lightheadedness, shortness of breath, overall feeling unwell):  Call 911. If you experience 2 or more shocks in 24 hours:  Call 911. If you receive a shock, you should not drive.  [place] DMV - no driving for 6 months if you receive appropriate therapy from your ICD.   ICD Alerts:  Some alerts are vibratory and others beep. These are NOT emergencies. Please call our office to let us know. If this occurs at night or on weekends, it can wait until the next business day. Send a remote transmission.  If your device is capable of reading fluid status (for heart failure), you will be offered monthly monitoring to review this with you.   Remote monitoring is used to monitor your ICD from home. This monitoring is scheduled every 91 days by our office. It allows us to keep an eye on the functioning of your device to ensure it is working properly. You will routinely see your Electrophysiologist annually  (more often if necessary).   

## 2021-02-19 ENCOUNTER — Ambulatory Visit: Payer: No Typology Code available for payment source | Admitting: Physician Assistant

## 2021-02-20 ENCOUNTER — Telehealth: Payer: Self-pay | Admitting: Licensed Clinical Social Worker

## 2021-02-20 NOTE — Telephone Encounter (Signed)
LCSW attempted to reach pt via telephone, pt answered but due to connection was unable to hear me. Was able to text and ask if received [facility] Financial Assistance application mailed last month. States he hasnt received it, I texted a picture of the application and requested he confirm address before I send additional copies. Pt then responded he mailed it about ten days ago and can send me electronic version to my email -  I provided work Leisure centre manager.   Westley Hummer, MSW, [place]  [phone]

## 2021-02-21 ENCOUNTER — Telehealth: Payer: Self-pay | Admitting: Licensed Clinical Social Worker

## 2021-02-21 NOTE — Telephone Encounter (Signed)
Received email with [facility] application and documents, I reached out to [place] with Patient Accounting, no applications received yet on their end. I will f/u on the 50IT, if application still not received by then I will print and mail the copy sent to me.   Westley Hummer, MSW, [place]  [phone]

## 2021-02-27 ENCOUNTER — Telehealth (HOSPITAL_COMMUNITY): Payer: Self-pay | Admitting: *Deleted

## 2021-02-28 ENCOUNTER — Other Ambulatory Visit: Payer: Self-pay

## 2021-03-05 ENCOUNTER — Telehealth (HOSPITAL_COMMUNITY): Payer: Self-pay | Admitting: *Deleted

## 2021-03-05 NOTE — Telephone Encounter (Signed)
Patient participates in the virtual cardiac rehab program. Called patient to check progress with exercise and schedule exit walk test and measurements. Patient is walking some but not as much prior to his ICD placement because of restrictions.  Patient's virtual CR program is scheduled to conclude on 03/11/21. Patient scheduled to come in for exit walk test and measurements on Tuesday, 03/12/21 at 1130.  Sol Passer, MS, ACSM CEP

## 2021-03-06 ENCOUNTER — Telehealth: Payer: Self-pay | Admitting: Licensed Clinical Social Worker

## 2021-03-06 NOTE — Telephone Encounter (Signed)
LCSW received f/u that pt CAFA had still not yet been received. LCSW communicated that to pt daughter Presley Raddle who had previously sent pt documents for application. No application document was attached so I sent a blank one and requested that pt complete the application portion and I would assist with sending application wholly in.   Westley Hummer, MSW, [place]  [phone]

## 2021-03-12 ENCOUNTER — Other Ambulatory Visit: Payer: Self-pay

## 2021-03-12 ENCOUNTER — Encounter (HOSPITAL_COMMUNITY)
Admission: RE | Admit: 2021-03-12 | Discharge: 2021-03-12 | Disposition: A | Discharge status: Home / Self Care | Payer: MEDICAID | Source: Ambulatory Visit | Attending: Internal Medicine | Admitting: Internal Medicine

## 2021-03-12 VITALS — BP 110/64 | HR 81 | Ht 68.0 in | Wt 147.0 lb

## 2021-03-12 DIAGNOSIS — Z955 Presence of coronary angioplasty implant and graft: Secondary | ICD-10-CM | POA: Insufficient documentation

## 2021-03-13 ENCOUNTER — Telehealth: Payer: Self-pay | Admitting: Licensed Clinical Social Worker

## 2021-03-13 NOTE — Progress Notes (Signed)
[name] has completed the virtual cardiac rehab program and will continue exercise independently at this time. Patient's virtual cardiac rehab report will be scanned to media for review.   Sol Passer, MS, ACSM CEP 03/13/2021 [id number]

## 2021-03-13 NOTE — Telephone Encounter (Signed)
LCSW reached out to pt this morning via telephone at [phone], I let him know that I received the documents from his daughter for the [facility] but not the application itself. I had emailed his daughter back with that attachment. I requested that he complete that and send it back to me and then we can send the full application in for consideration. Pt states understanding and will complete.   Westley Hummer, MSW, [place]  [phone]- work cell phone (preferred) [phone]- desk phone

## 2021-03-25 ENCOUNTER — Ambulatory Visit (HOSPITAL_COMMUNITY)
Admission: EM | Admit: 2021-03-25 | Discharge: 2021-03-25 | Disposition: A | Discharge status: Home / Self Care | Payer: Self-pay | Attending: Internal Medicine | Admitting: Internal Medicine

## 2021-03-25 ENCOUNTER — Other Ambulatory Visit: Payer: Self-pay

## 2021-03-25 ENCOUNTER — Encounter (HOSPITAL_COMMUNITY): Payer: Self-pay | Admitting: Emergency Medicine

## 2021-03-25 ENCOUNTER — Ambulatory Visit (INDEPENDENT_AMBULATORY_CARE_PROVIDER_SITE_OTHER): Payer: Self-pay

## 2021-03-25 DIAGNOSIS — R059 Cough, unspecified: Secondary | ICD-10-CM

## 2021-03-25 DIAGNOSIS — J4531 Mild persistent asthma with (acute) exacerbation: Secondary | ICD-10-CM | POA: Insufficient documentation

## 2021-03-25 DIAGNOSIS — R0602 Shortness of breath: Secondary | ICD-10-CM

## 2021-03-25 DIAGNOSIS — Z20822 Contact with and (suspected) exposure to covid-19: Secondary | ICD-10-CM | POA: Insufficient documentation

## 2021-03-25 DIAGNOSIS — Z7952 Long term (current) use of systemic steroids: Secondary | ICD-10-CM | POA: Insufficient documentation

## 2021-03-25 MED ORDER — METHYLPREDNISOLONE SODIUM SUCC 125 MG IJ SOLR
INTRAMUSCULAR | Status: AC
  Filled 2021-03-25: qty 2

## 2021-03-25 MED ORDER — PREDNISONE 10 MG (21) PO TBPK: ORAL_TABLET | Freq: Every day | ORAL | 0 refills | Status: DC

## 2021-03-25 MED ORDER — ALBUTEROL SULFATE (2.5 MG/3ML) 0.083% IN NEBU: 2.5000 mg | INHALATION_SOLUTION | Freq: Four times a day (QID) | RESPIRATORY_TRACT | 3 refills | Status: DC | PRN

## 2021-03-25 MED ORDER — METHYLPREDNISOLONE SODIUM SUCC 125 MG IJ SOLR
60.0000 mg | Freq: Once | INTRAMUSCULAR | Status: AC
  Administered 2021-03-25: 60 mg via INTRAMUSCULAR

## 2021-03-25 MED ORDER — PREDNISONE 10 MG PO TABS: 10.0000 mg | ORAL_TABLET | Freq: Every day | ORAL | 1 refills | Status: DC

## 2021-03-25 NOTE — ED Triage Notes (Signed)
Pt presents with productive cough, sob, and wheezing Xs 3 days.

## 2021-03-25 NOTE — Discharge Instructions (Addendum)
Please take medications as prescribed If you are worsening shortness of breath, wheezing, chest pain, cough or difficulty breathing chest tightness please go to the emergency department for further evaluation Chest x-ray does not show pneumonia We will call you with recommendations if labs are abnormal.

## 2021-03-25 NOTE — ED Provider Notes (Signed)
[place]    CSN: [id number] Arrival date & time: 03/25/21  1830      History   Chief Complaint Chief Complaint  Patient presents with   Shortness of Breath    HPI [name] is a 59 y.o. male with a history of moderate persistent asthma currently steroid-dependent comes to the urgent care with complaints of shortness of breath, cough productive of yellowish sputum and wheezing of 3 days duration.  Patient's symptoms started insidiously and has been persistent.  He has tried his inhalers with no improvement in symptoms.  He has run out of his nebulizer solutions.  No fever or chills.  No chest pain or chest pressure.  Cough is persistent and is interfering with his sleep pattern.  No sore throat or sick contacts.  No diarrhea.  Patient tested negative for COVID a couple of weeks ago when he had a cardiac pacemaker placed.Marland Kitchen   HPI  Past Medical History:  Diagnosis Date   Asthma    CAD (coronary artery disease)    a. CAD s/p anterior MI in 2008 treated with DES to the LAD.   Cataract 2018   both eyes   Cellulitis of leg, right 10/2014   COPD (chronic obstructive pulmonary disease) (HCC)    Dental caries    Diabetes mellitus    TYPE 2   GERD (gastroesophageal reflux disease)    Hyperlipidemia    Hypertension 2017   Ischemic cardiomyopathy    Leukocytosis    noted on labs   Microcytic anemia    Myocardial infarction [facility]) 2008   Reactive airway disease    Thyroid nodule    biopsy negative 2006    Patient Active Problem List   Diagnosis Date Noted   HFrEF (heart failure with reduced ejection fraction) ([place]) 09/24/2020   Non-ST elevation (NSTEMI) myocardial infarction ([place]) 09/22/2020   Ventricular tachycardia 09/20/2020   Current chronic use of systemic steroids 08/07/2020   Poor dentition 02/18/2020   Former smoker 01/13/2020   Syncope and collapse    Microcytic anemia - 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 05/18/2019   GERD (gastroesophageal  reflux disease) 10/26/2017   Mixed hyperlipidemia 07/13/2017   Primary hypertension 04/22/2017   COPD with asthma ([place]) 12/18/2014   Cataracts, bilateral 06/27/2014   Cardiomyopathy, ischemic 11/11/2011   Type 2 diabetes mellitus with complication, without long-term current use of insulin ([place]) 06/12/2009   CAD (coronary artery disease) 07/13/2008   History of MI (myocardial infarction) 04/01/2007   Sinusitis, chronic 06/18/2006    Past Surgical History:  Procedure Laterality Date   CORONARY STENT INTERVENTION N/A 09/20/2020   Procedure: CORONARY STENT INTERVENTION;  Surgeon: Lorretta Harp, MD;  Location: [place] [facility];  Service: Cardiovascular;  Laterality: N/A;   CORONARY STENT PLACEMENT     ESOPHAGOGASTRODUODENOSCOPY N/A 03/29/2017   Procedure: ESOPHAGOGASTRODUODENOSCOPY (EGD);  Surgeon: Milus Banister, MD;  Location: [facility];  Service: Endoscopy;  Laterality: N/A;   ESOPHAGOGASTRODUODENOSCOPY N/A 03/31/2018   Procedure: ESOPHAGOGASTRODUODENOSCOPY (EGD);  Surgeon: Doran Stabler, MD;  Location: [place];  Service: Gastroenterology;  Laterality: N/A;   FOREIGN BODY REMOVAL N/A 03/31/2018   Procedure: FOREIGN BODY REMOVAL;  Surgeon: Doran Stabler, MD;  Location: [place];  Service: Gastroenterology;  Laterality: N/A;   ICD IMPLANT N/A 02/04/2021   Procedure: ICD IMPLANT;  Surgeon: Evans Lance, MD;  Location: [place] [facility];  Service: Cardiovascular;  Laterality: N/A;   LEFT HEART CATH AND  CORONARY ANGIOGRAPHY N/A 09/20/2020   Procedure: LEFT HEART CATH AND CORONARY ANGIOGRAPHY;  Surgeon: Lorretta Harp, MD;  Location: [place] [facility];  Service: Cardiovascular;  Laterality: N/A;   solitary pulm and thryoid nodules         Home Medications    Prior to Admission medications   Medication Sig Start Date End Date Taking? Authorizing Provider  acetaminophen (TYLENOL) 500 MG tablet Take 2 tablets (1,000 mg total) by mouth every 8 (eight)  hours. Patient taking differently: Take 1,000 mg by mouth 2 (two) times daily as needed for moderate pain. 04/11/20   Daisy Floro, DO  ADVAIR HFA [phone] MCG/ACT inhaler INHALE 2 PUFFS INTO THE LUNGS 2 TIMES A DAY. 09/10/20   Tanda Rockers, MD  albuterol (PROVENTIL) (2.5 MG/3ML) 0.083% nebulizer solution Take 3 mLs (2.5 mg total) by nebulization every 6 (six) hours as needed for wheezing or shortness of breath. 03/26/21   Chase Picket, MD  aspirin EC 81 MG tablet Take 1 tablet (81 mg total) by mouth daily. 11/14/20   Burnell Blanks, MD  atorvastatin (LIPITOR) 80 MG tablet Take 1 tablet (80 mg total) by mouth at bedtime. 01/28/21   Burnell Blanks, MD  bisoprolol (ZEBETA) 5 MG tablet Take 0.5 tablets (2.5 mg total) by mouth daily. 11/16/20   Burnell Blanks, MD  calcium-vitamin D (OSCAL WITH D) 250-125 MG-UNIT tablet Take 1 tablet by mouth 2 (two) times daily.    [provider]  clopidogrel (PLAVIX) 75 MG tablet Take 1 tablet (75 mg total) by mouth daily with breakfast. 01/28/21   Burnell Blanks, MD  FARXIGA 10 MG TABS tablet TAKE 1 TABLET (10MG  TOTAL) BY MOUTH DAILY. 10/24/20   Maness, Arnette Norris, MD  ferrous sulfate 325 (65 FE) MG tablet Take 325 mg by mouth daily with breakfast.    [provider]  metFORMIN (GLUCOPHAGE) 1000 MG tablet Take 1 tablet (1,000 mg total) by mouth 2 (two) times daily with a meal. 10/18/20   Burnell Blanks, MD  Multiple Vitamin (MULTIVITAMIN WITH MINERALS) TABS tablet Take 1 tablet by mouth daily.    [provider]  nitroGLYCERIN (NITROSTAT) 0.4 MG SL tablet Place 1 tablet (0.4 mg total) under the tongue every 5 (five) minutes x 3 doses as needed for chest pain. 09/22/20   Darreld Mclean, PA-C  omeprazole (PRILOSEC OTC) 20 MG tablet Take 20 mg by mouth daily.    [provider]  pantoprazole (PROTONIX) 40 MG tablet Take 1 tablet (40 mg total) by mouth daily. 02/11/21   Daleen Bo, MD   predniSONE (DELTASONE) 10 MG tablet Take 1 tablet (10 mg total) by mouth daily. 04/07/21 05/07/21  Chase Picket, MD  predniSONE (STERAPRED UNI-PAK 21 TAB) 10 MG (21) TBPK tablet Take by mouth daily. Take 6 tabs by mouth daily  for 2 days, then 5 tabs for 2 days, then 4 tabs for 2 days, then 3 tabs for 2 days, 2 tabs for 2 days, then 1 tab by mouth daily for 2 days 03/26/21   Chase Picket, MD  PROAIR HFA 108 [phone] Base) MCG/ACT inhaler INHALE 2 PUFFS INTO THE LUNGS EVERY 4 HOURS AS NEEDED FOR WHEEZING OR SHORTNESS OF BREATH (PLAN B). 07/31/20   Tanda Rockers, MD  valsartan (DIOVAN) 40 MG tablet Take 1 tablet (40 mg total) by mouth daily. 11/16/20   Burnell Blanks, MD    Family History Family History  Problem Relation  Age of Onset   Diabetes Mother    Heart attack Brother 72   Colon cancer Neg Hx    Esophageal cancer Neg Hx    Rectal cancer Neg Hx    Stomach cancer Neg Hx     Social History Social History   Tobacco Use   Smoking status: Former    Packs/day: 0.50    Years: 22.00    Pack years: 11.00    Types: Cigarettes    Quit date: 04/21/2006    Years since quitting: 14.9   Smokeless tobacco: Never   Tobacco comments:    quit 10 years ago  Vaping Use   Vaping Use: Never used  Substance Use Topics   Alcohol use: No    Alcohol/week: 0.0 standard drinks   Drug use: No     Allergies   Patient has no known allergies.   Review of Systems Review of Systems  Constitutional: Negative.  Negative for chills, fatigue and fever.  HENT:  Positive for congestion. Negative for mouth sores, sore throat and voice change.   Respiratory:  Positive for cough, chest tightness, shortness of breath and wheezing.   Cardiovascular:  Negative for chest pain.  Gastrointestinal: Negative.   Musculoskeletal:  Negative for arthralgias, joint swelling and myalgias.  Neurological: Negative.  Negative for headaches.    Physical Exam Triage Vital Signs ED Triage Vitals  Enc  Vitals Group     BP 03/25/21 1924 (!) 149/83     Pulse Rate 03/25/21 1924 95     Resp 03/25/21 1924 20     Temp 03/25/21 1924 98.5 F (36.9 C)     Temp Source 03/25/21 1924 Oral     SpO2 03/25/21 1924 94 %     Weight --      Height --      Head Circumference --      Peak Flow --      Pain Score 03/25/21 1922 0     Pain Loc --      Pain Edu? --      Excl. in [place]? --    No data found.  Updated Vital Signs BP (!) 149/83 (BP Location: Right Arm)   Pulse 95   Temp 98.5 F (36.9 C) (Oral)   Resp 20   SpO2 94%   Visual Acuity Right Eye Distance:   Left Eye Distance:   Bilateral Distance:    Right Eye Near:   Left Eye Near:    Bilateral Near:     Physical Exam Vitals and nursing note reviewed.  Constitutional:      General: He is in acute distress.     Appearance: He is ill-appearing.  Cardiovascular:     Rate and Rhythm: Normal rate.  Pulmonary:     Effort: Pulmonary effort is normal. No tachypnea.     Breath sounds: Examination of the right-lower field reveals decreased breath sounds, wheezing and rhonchi. Examination of the left-lower field reveals decreased breath sounds, wheezing and rhonchi. Decreased breath sounds, wheezing and rhonchi present. No rales.  Musculoskeletal:        General: Normal range of motion.  Neurological:     Mental Status: He is alert.     UC Treatments / Results  Labs (all labs ordered are listed, but only abnormal results are displayed) Labs Reviewed  SARS CORONAVIRUS 2 (TAT 6-24 HRS)    EKG   Radiology DG Chest 2 View  Result Date: 03/25/2021 CLINICAL DATA:  Shortness of breath  and cough for several days, initial EN EXAM: CHEST - 2 VIEW COMPARISON:  02/04/2021 FINDINGS: Cardiac shadow is within normal limits. Defibrillator is again seen and stable. Lungs are well aerated bilaterally. No focal infiltrate or effusion is noted. No bony abnormality is seen. IMPRESSION: No active cardiopulmonary disease. Electronically Signed   By:  Inez Catalina M.D.   On: 03/25/2021 19:58    Procedures Procedures (including critical care time)  Medications Ordered in UC Medications  methylPREDNISolone sodium succinate (SOLU-MEDROL) 125 mg/2 mL injection 60 mg (60 mg Intramuscular Given 03/25/21 1949)    Initial Impression / Assessment and Plan / UC Course  I have reviewed the triage vital signs and the nursing notes.  Pertinent labs & imaging results that were available during my care of the patient were reviewed by me and considered in my medical decision making (see chart for details).     1.  Moderate persistent asthma with acute exacerbation: Solu-Medrol 60 mg IM x1 dose Tapering dose of prednisone Albuterol nebulizing solutions prescribed Chest x-ray is negative for acute cardiopulmonary disease Over-the-counter cough medications Return to urgent care if symptoms worsen COVID-19 PCR test has been sent. Final Clinical Impressions(s) / UC Diagnoses   Final diagnoses:  Mild persistent asthma dependent on systemic steroids with acute exacerbation     Discharge Instructions      Please take medications as prescribed If you are worsening shortness of breath, wheezing, chest pain, cough or difficulty breathing chest tightness please go to the emergency department for further evaluation Chest x-ray does not show pneumonia We will call you with recommendations if labs are abnormal.     ED Prescriptions     Medication Sig Dispense Auth. Provider   predniSONE (STERAPRED UNI-PAK 21 TAB) 10 MG (21) TBPK tablet Take by mouth daily. Take 6 tabs by mouth daily  for 2 days, then 5 tabs for 2 days, then 4 tabs for 2 days, then 3 tabs for 2 days, 2 tabs for 2 days, then 1 tab by mouth daily for 2 days 42 tablet Breckyn Ticas, Myrene Galas, MD   predniSONE (DELTASONE) 10 MG tablet Take 1 tablet (10 mg total) by mouth daily. 15 tablet Dilyn Smiles, Myrene Galas, MD   albuterol (PROVENTIL) (2.5 MG/3ML) 0.083% nebulizer solution Take 3 mLs (2.5 mg  total) by nebulization every 6 (six) hours as needed for wheezing or shortness of breath. 75 mL Layza Summa, Myrene Galas, MD      PDMP not reviewed this encounter.   Chase Picket, MD 03/26/21 [phone]

## 2021-03-26 ENCOUNTER — Telehealth (HOSPITAL_COMMUNITY): Payer: Self-pay | Admitting: Emergency Medicine

## 2021-03-26 ENCOUNTER — Telehealth (HOSPITAL_COMMUNITY): Payer: Self-pay

## 2021-03-26 LAB — SARS CORONAVIRUS 2 (TAT 6-24 HRS): SARS Coronavirus 2: NEGATIVE

## 2021-03-26 MED ORDER — PREDNISONE 10 MG (21) PO TBPK: ORAL_TABLET | Freq: Every day | ORAL | 0 refills | Status: DC

## 2021-03-26 MED ORDER — PREDNISONE 10 MG PO TABS: 10.0000 mg | ORAL_TABLET | Freq: Every day | ORAL | 1 refills | Status: DC

## 2021-03-26 MED ORDER — ALBUTEROL SULFATE (2.5 MG/3ML) 0.083% IN NEBU: 2.5000 mg | INHALATION_SOLUTION | Freq: Four times a day (QID) | RESPIRATORY_TRACT | 3 refills | Status: DC | PRN

## 2021-03-26 NOTE — Telephone Encounter (Signed)
Returned call to pt. Reports he pick up his med, no pharmacy needs to be change.

## 2021-03-26 NOTE — Telephone Encounter (Signed)
Received call from [place], states pt would like [facility] from 12/5 visit [facility]

## 2021-03-28 ENCOUNTER — Other Ambulatory Visit: Payer: Self-pay | Admitting: Internal Medicine

## 2021-03-28 ENCOUNTER — Other Ambulatory Visit: Payer: Self-pay

## 2021-03-28 ENCOUNTER — Ambulatory Visit (INDEPENDENT_AMBULATORY_CARE_PROVIDER_SITE_OTHER): Payer: No Typology Code available for payment source | Admitting: Internal Medicine

## 2021-03-28 ENCOUNTER — Other Ambulatory Visit: Payer: Self-pay | Admitting: Primary Care

## 2021-03-28 ENCOUNTER — Encounter: Payer: Self-pay | Admitting: Internal Medicine

## 2021-03-28 DIAGNOSIS — J449 Chronic obstructive pulmonary disease, unspecified: Secondary | ICD-10-CM

## 2021-03-28 MED ORDER — PREDNISONE 10 MG PO TABS
ORAL_TABLET | ORAL | 2 refills | Status: DC
  Filled 2021-03-28 – 2021-06-19 (×4): qty 60, 30d supply, fill #0

## 2021-03-28 MED ORDER — AZITHROMYCIN 250 MG PO TABS
ORAL_TABLET | ORAL | 0 refills | Status: DC
  Filled 2021-03-28: qty 6, 5d supply, fill #0

## 2021-03-28 NOTE — Assessment & Plan Note (Signed)
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 2008  - PFT's 2007  FEV1  1.78 (50%) ratio 52 and 19% resp to saba, dlco 75% - d/c coreg 09/03/2013  - spirometry 12/18/2014 >  FEV1 1.04 (34%) ratio 53     > trial of dulera 200 2bid and prednisone 5 mg daily   - Spiriva respimat added 05/01/2015  - max gerd rx1/01/2016 > improved 06/12/2015   - FENO 12/21/2015  =   9  p am symb 160 rx - Spirometry 12/21/2015  FEV1 1.33 (47%)  Ratio 53 p am symb 160/spiriva  - Spirometry 06/12/2017  FEV1 1.43 (49%)  Ratio 51 p dulera and off spiriva xone week so leave off spriva - 07/13/2018  After extensive coaching inhaler device,  effectiveness =    90% with hfa > continue dulera 200 2bid and reduce pred floor to 5 mg  08/19/2019 Stable follow-up. Off ACEi, improved cough/chest pain. Continue Dulera 200 and prednisone10mg  (wheezing worse on 5mg )  Flared off prednisone> rx by er 01/23/21 with shrot course only and not much better yet so added zpak today   - The proper method of use, as well as anticipated side effects, of a metered-dose inhaler were discussed and demonstrated to the patient using teach back method. Improved effectiveness after extensive coaching during this visit to a level of approximately 90 % from a baseline of 75 % > continue advair 230 and prednisone floor of 10 mg and ceiling of 20 mg daily   ABCD plan reviewed - see avs for instructions unique to this ov          Each maintenance medication was reviewed in detail including emphasizing most importantly the difference between maintenance and prns and under what circumstances the prns are to be triggered using an action plan format where appropriate.  Total time for H and P, chart review, counseling, reviewing hfa/neb device(s) and generating customized AVS unique to this office visit / same day charting = 25 min
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
## 2021-03-28 NOTE — Patient Instructions (Addendum)
Protonix is Take 30-60 min before first meal of the day and prilosec is 30 min before supper    Plan A = Automatic = Always=    advair 230 Take 2 puffs first thing in am and then another 2 puffs about 12 hours later.   Prednisone 10 mg with breakfast   Zpak > finish the pillows   Plan B = Backup (to supplement plan A, not to replace it) Only use your albuterol inhaler as a rescue medication to be used if you can't catch your breath by resting or doing a relaxed purse lip breathing pattern.  - The less you use it, the better it will work when you need it. - Ok to use the inhaler up to 2 puffs  every 4 hours if you must but call for appointment if use goes up over your usual need - Don't leave home without it !!  (think of it like the spare tire for your car)   Plan C = Crisis (instead of Plan B but only if Plan B stops working) - only use your albuterol nebulizer if you first try Plan B and it fails to help > ok to use the nebulizer up to every 4 hours but if start needing it regularly call for immediate appointment   Plan D = Deltasone = Prednisone if ABC not working well  - double  the dose until better then back to 10 mg daily      .Please schedule a follow up visit in 6 months but call sooner if needed

## 2021-03-29 ENCOUNTER — Telehealth: Payer: Self-pay | Admitting: Licensed Clinical Social Worker

## 2021-03-29 NOTE — Telephone Encounter (Signed)
LCSW received email from pt daughter with application- I have sent all documents received and his application for [employer] . I updated pt daughter with this information and cautioned her it has been taking 4+ weeks to process applications and gave her the number for [facility] to f/u before that time if interested.   Westley Hummer, MSW, [place]  [phone]- work cell phone (preferred) [phone]- desk phone

## 2021-04-09 ENCOUNTER — Telehealth: Payer: Self-pay | Admitting: Internal Medicine

## 2021-04-09 NOTE — Telephone Encounter (Signed)
Call returned to patient daughter, appt made.  Nothing further needed at this time.

## 2021-04-09 NOTE — Telephone Encounter (Signed)
If using neb ever 2 hr and no improvement with abx /steroids   Will need ov for further evaluation  Lots of openings   Please contact office for sooner follow up if symptoms do not improve or worsen or seek emergency care

## 2021-04-09 NOTE — Telephone Encounter (Signed)
Called and spoke with daughter Presley Raddle who states that patient is still sick and states he is getting worse despite taking antiobiotics. She states her has a productive cough with yellow sputum and wheezing. Denies fever. Was seen on 12/8 with MW. He is doing nebulizer every 2 hours. Finished the antibiotic and prednisone. Pharmacy is [facility] please advise

## 2021-04-10 ENCOUNTER — Encounter: Payer: Self-pay | Admitting: Pulmonary Disease

## 2021-04-10 ENCOUNTER — Ambulatory Visit: Payer: No Typology Code available for payment source | Admitting: Adult Health

## 2021-04-10 ENCOUNTER — Other Ambulatory Visit: Payer: Self-pay

## 2021-04-10 ENCOUNTER — Ambulatory Visit (INDEPENDENT_AMBULATORY_CARE_PROVIDER_SITE_OTHER): Payer: No Typology Code available for payment source | Admitting: Pulmonary Disease

## 2021-04-10 VITALS — BP 116/78 | HR 60 | Temp 98.3°F | Ht 68.0 in | Wt 142.6 lb

## 2021-04-10 DIAGNOSIS — J449 Chronic obstructive pulmonary disease, unspecified: Secondary | ICD-10-CM

## 2021-04-10 MED ORDER — TRELEGY ELLIPTA 200-62.5-25 MCG/ACT IN AEPB: 1.0000 | INHALATION_SPRAY | Freq: Every day | RESPIRATORY_TRACT | 0 refills | Status: DC

## 2021-04-10 MED ORDER — TRELEGY ELLIPTA 200-62.5-25 MCG/ACT IN AEPB: 1.0000 | INHALATION_SPRAY | Freq: Every day | RESPIRATORY_TRACT | 6 refills | Status: DC

## 2021-04-10 NOTE — Progress Notes (Signed)
Synopsis: Referred in December 2022 for Acute sick visit, patient of Dr. Melvyn Novas  Subjective:   PATIENT ID: [name] GENDER: male DOB: [birth year], MRN: [id number]   HPI  Chief Complaint  Patient presents with   Acute Visit    Increased cough and wheezing for the past 2 weeks. Productive cough with yellowish phlegm. Abx and prednisone have not helped.    [name] is a 60 year old male, former smoker with CAD, DM II, GERD, hypertension and asthma-COPD overlap syndrome who returns to clinic for acute visit.   Patient was recently treated for asthma-COPD exacerbation with steroid taper and azithromycin. He finished the steroid taper 1 week ago. He has started to feel better today. He is using the nebulizer multiples throughout the day and night. He reports he is able to sleep a bit better over the past couple of nights.   He is back to his baseline 10mg  of prednisone daily. He was trialed on Xolair in the past without much improvement per records. He is interested in other biologic agents if possible to get off the prednisone.   Past Medical History:  Diagnosis Date   Asthma    CAD (coronary artery disease)    a. CAD s/p anterior MI in 2008 treated with DES to the LAD.   Cataract 2018   both eyes   Cellulitis of leg, right 10/2014   COPD (chronic obstructive pulmonary disease) (HCC)    Dental caries    Diabetes mellitus    TYPE 2   GERD (gastroesophageal reflux disease)    Hyperlipidemia    Hypertension 2017   Ischemic cardiomyopathy    Leukocytosis    noted on labs   Microcytic anemia    Myocardial infarction [facility]) 2008   Reactive airway disease    Thyroid nodule    biopsy negative 2006     Family History  Problem Relation Age of Onset   Diabetes Mother    Heart attack Brother 66   Colon cancer Neg Hx    Esophageal cancer Neg Hx    Rectal cancer Neg Hx    Stomach cancer Neg Hx      Social History   Socioeconomic History   Marital status: Married     Spouse name: Not on file   Number of children: 3   Years of education: Not on file   Highest education level: Not on file  Occupational History   Not on file  Tobacco Use   Smoking status: Former    Packs/day: 0.50    Years: 22.00    Pack years: 11.00    Types: Cigarettes    Quit date: 04/21/2006    Years since quitting: 14.9   Smokeless tobacco: Never   Tobacco comments:    quit 10 years ago  Vaping Use   Vaping Use: Never used  Substance and Sexual Activity   Alcohol use: No    Alcohol/week: 0.0 standard drinks   Drug use: No   Sexual activity: Yes    Birth control/protection: Condom  Other Topics Concern   Not on file  Social History Narrative   Lives with wife; recently moved to [place] from [place] due to health problems. Has no means of affording meds currently + tobacco 1/2ppd x 22yrs, no ETOH no drugs.   Social Determinants of Health   Financial Resource Strain: High Risk   Difficulty of Paying Living Expenses: Hard  Food Insecurity: No Food Insecurity   Worried About Running Out  of Food in the Last Year: Never true   [place] in the Last Year: Never true  Transportation Needs: No Transportation Needs   Lack of Transportation (Medical): No   Lack of Transportation (Non-Medical): No  Physical Activity: Not on file  Stress: Not on file  Social Connections: Not on file  Intimate Partner Violence: Not on file     No Known Allergies   Outpatient Medications Prior to Visit  Medication Sig Dispense Refill   acetaminophen (TYLENOL) 500 MG tablet Take 2 tablets (1,000 mg total) by mouth every 8 (eight) hours. (Patient taking differently: Take 1,000 mg by mouth 2 (two) times daily as needed for moderate pain.) 30 tablet 1   albuterol (PROAIR HFA) 108 (90 Base) MCG/ACT inhaler INHALE 2 PUFFS INTO THE LUNGS EVERY 4 HOURS AS NEEDED FOR WHEEZING OR SHORTNESS OF BREATH (PLAN B). 1 each 1   albuterol (PROVENTIL) (2.5 MG/3ML) 0.083% nebulizer solution Take 3 mLs (2.5 mg  total) by nebulization every 6 (six) hours as needed for wheezing or shortness of breath. 75 mL 3   aspirin EC 81 MG tablet Take 1 tablet (81 mg total) by mouth daily. 30 tablet 11   atorvastatin (LIPITOR) 80 MG tablet Take 1 tablet (80 mg total) by mouth at bedtime. 90 tablet 3   bisoprolol (ZEBETA) 5 MG tablet Take 0.5 tablets (2.5 mg total) by mouth daily. 45 tablet 3   calcium-vitamin D (OSCAL WITH D) 250-125 MG-UNIT tablet Take 1 tablet by mouth 2 (two) times daily.     clopidogrel (PLAVIX) 75 MG tablet Take 1 tablet (75 mg total) by mouth daily with breakfast. 90 tablet 3   FARXIGA 10 MG TABS tablet TAKE 1 TABLET (10MG  TOTAL) BY MOUTH DAILY. 90 tablet 0   ferrous sulfate 325 (65 FE) MG tablet Take 325 mg by mouth daily with breakfast.     metFORMIN (GLUCOPHAGE) 1000 MG tablet Take 1 tablet (1,000 mg total) by mouth 2 (two) times daily with a meal. 180 tablet 1   Multiple Vitamin (MULTIVITAMIN WITH MINERALS) TABS tablet Take 1 tablet by mouth daily.     nitroGLYCERIN (NITROSTAT) 0.4 MG SL tablet Place 1 tablet (0.4 mg total) under the tongue every 5 (five) minutes x 3 doses as needed for chest pain. 25 tablet 2   omeprazole (PRILOSEC OTC) 20 MG tablet Take 20 mg by mouth daily.     pantoprazole (PROTONIX) 40 MG tablet Take 1 tablet (40 mg total) by mouth daily. 30 tablet 1   predniSONE (DELTASONE) 10 MG tablet Take 2 tablets by mouth daily until better then take 1 tablet daily 100 tablet 2   valsartan (DIOVAN) 40 MG tablet Take 1 tablet (40 mg total) by mouth daily. 90 tablet 3   ADVAIR HFA 230-21 MCG/ACT inhaler INHALE 2 PUFFS INTO THE LUNGS 2 TIMES A DAY. 1 each 11   azithromycin (ZITHROMAX) 250 MG tablet Take 2 on day one, then 1 daily for 4 days 6 tablet 0   No facility-administered medications prior to visit.    Review of Systems  Constitutional:  Negative for chills, fever, malaise/fatigue and weight loss.  HENT:  Negative for congestion, sinus pain and sore throat.   Eyes:  Negative.   Respiratory:  Positive for cough, shortness of breath and wheezing. Negative for hemoptysis and sputum production.   Cardiovascular:  Negative for chest pain, palpitations, orthopnea, claudication and leg swelling.  Gastrointestinal:  Negative for abdominal pain, heartburn, nausea and vomiting.  Genitourinary: Negative.   Musculoskeletal:  Negative for joint pain and myalgias.  Skin:  Negative for rash.  Neurological:  Negative for weakness.  Endo/Heme/Allergies: Negative.   Psychiatric/Behavioral: Negative.       Objective:   Vitals:   04/10/21 1026  BP: 116/78  Pulse: 60  Temp: 98.3 F (36.8 C)  TempSrc: Oral  SpO2: 98%  Weight: 142 lb 9.6 oz (64.7 kg)  Height: 5\' 8"  (1.727 m)     Physical Exam Constitutional:      General: He is not in acute distress. HENT:     Head: Normocephalic and atraumatic.  Eyes:     Extraocular Movements: Extraocular movements intact.     Conjunctiva/sclera: Conjunctivae normal.     Pupils: Pupils are equal, round, and reactive to light.  Cardiovascular:     Rate and Rhythm: Normal rate and regular rhythm.     Pulses: Normal pulses.     Heart sounds: Normal heart sounds. No murmur heard. Pulmonary:     Effort: Pulmonary effort is normal.     Breath sounds: Wheezing (mild, RUL field) present. No rhonchi or rales.  Abdominal:     General: Bowel sounds are normal.     Palpations: Abdomen is soft.  Musculoskeletal:     Right lower leg: No edema.     Left lower leg: No edema.  Lymphadenopathy:     Cervical: No cervical adenopathy.  Skin:    General: Skin is warm and dry.  Neurological:     General: No focal deficit present.     Mental Status: He is alert.  Psychiatric:        Mood and Affect: Mood normal.        Behavior: Behavior normal.        Thought Content: Thought content normal.        Judgment: Judgment normal.   CBC    Component Value Date/Time   WBC 15.7 (H) 02/11/2021 0851   RBC 5.95 (H) 02/11/2021 0851    HGB 14.1 02/11/2021 0851   HGB 13.2 02/01/2021 1405   HGB 13.3 07/05/2014 1431   HCT 45.6 02/11/2021 0851   HCT 39.8 02/01/2021 1405   HCT 40.1 07/05/2014 1431   PLT 145 (L) 02/11/2021 0851   PLT 231 02/01/2021 1405   MCV 76.6 (L) 02/11/2021 0851   MCV 74 (L) 02/01/2021 1405   MCV 72 (L) 07/05/2014 1431   MCH 23.7 (L) 02/11/2021 0851   MCHC 30.9 02/11/2021 0851   RDW 15.3 02/11/2021 0851   RDW 15.8 (H) 02/01/2021 1405   RDW 14.4 07/05/2014 1431   LYMPHSABS 3.3 02/11/2021 0851   LYMPHSABS 1.5 02/01/2021 1405   LYMPHSABS 1.3 07/05/2014 1431   MONOABS 1.2 (H) 02/11/2021 0851   EOSABS 0.7 (H) 02/11/2021 0851   EOSABS 0.2 02/01/2021 1405   EOSABS 0.1 07/05/2014 1431   BASOSABS 0.1 02/11/2021 0851   BASOSABS 0.1 02/01/2021 1405   BASOSABS 0.1 07/05/2014 1431   BMP Latest Ref Rng & Units 02/11/2021 02/01/2021 11/09/2020  Glucose 70 - 99 mg/dL 97 143(H) 100(H)  BUN 8 - 23 mg/dL 17 14 21   Creatinine 0.61 - 1.24 mg/dL 0.92 0.94 1.09  BUN/Creat Ratio 10 - 24 - 15 19  Sodium 135 - 145 mmol/L 141 138 141  Potassium 3.5 - 5.1 mmol/L 4.1 4.5 4.3  Chloride 98 - 111 mmol/L 105 100 103  CO2 22 - 32 mmol/L 25 21 21   Calcium 8.9 - 10.3 mg/dL 9.4 9.5 9.8  Chest imaging: CXR 03/25/21 Cardiac shadow is within normal limits. Defibrillator is again seen and stable. Lungs are well aerated bilaterally. No focal infiltrate or effusion is noted. No bony abnormality is seen  PFT: No flowsheet data found.  Labs:  Path:  Echo 12/28/20: EF 30-35%. Mild LV hypertrophy. Grade I diastolic dysfunction.   Heart Catheterization:  Assessment & Plan:   Asthma-COPD overlap syndrome [facility]) - Plan: Pulmonary Function Test, Fluticasone-Umeclidin-Vilant (TRELEGY ELLIPTA) 200-62.5-25 MCG/ACT AEPB  Discussion: [name] is a 60 year old male, former smoker with CAD, DM II, GERD, hypertension and asthma-COPD overlap syndrome who returns to clinic for acute visit.   He is starting to feel better  after steroid taper and antibiotic. We will hold off on further prednisone taper at this time given his improvement.   We will change his inhaler therapy from advair HFA to trelegy ellipta 200-62.5-25mcg 1 puff daily. He can continue albuterol inhaler or nebulizer as needed every 4-6 hours.   Therapies that can be considered in the future include montelukast 10mg  daily and adding Fasenra or dupixent as trial of Xolair in the past did not result in improvement. Goal should be to taper patient off prednisone if possible with one of these agents.   He is to follow up with Dr. Melvyn Novas in 6-8 weeks for pulmonary function tests and further management.   Freda Jackson, MD [facility] Office: [phone]   Current Outpatient Medications:    acetaminophen (TYLENOL) 500 MG tablet, Take 2 tablets (1,000 mg total) by mouth every 8 (eight) hours. (Patient taking differently: Take 1,000 mg by mouth 2 (two) times daily as needed for moderate pain.), Disp: 30 tablet, Rfl: 1   albuterol (PROAIR HFA) 108 (90 Base) MCG/ACT inhaler, INHALE 2 PUFFS INTO THE LUNGS EVERY 4 HOURS AS NEEDED FOR WHEEZING OR SHORTNESS OF BREATH (PLAN B)., Disp: 1 each, Rfl: 1   albuterol (PROVENTIL) (2.5 MG/3ML) 0.083% nebulizer solution, Take 3 mLs (2.5 mg total) by nebulization every 6 (six) hours as needed for wheezing or shortness of breath., Disp: 75 mL, Rfl: 3   aspirin EC 81 MG tablet, Take 1 tablet (81 mg total) by mouth daily., Disp: 30 tablet, Rfl: 11   atorvastatin (LIPITOR) 80 MG tablet, Take 1 tablet (80 mg total) by mouth at bedtime., Disp: 90 tablet, Rfl: 3   bisoprolol (ZEBETA) 5 MG tablet, Take 0.5 tablets (2.5 mg total) by mouth daily., Disp: 45 tablet, Rfl: 3   calcium-vitamin D (OSCAL WITH D) 250-125 MG-UNIT tablet, Take 1 tablet by mouth 2 (two) times daily., Disp: , Rfl:    clopidogrel (PLAVIX) 75 MG tablet, Take 1 tablet (75 mg total) by mouth daily with breakfast., Disp: 90 tablet, Rfl: 3    FARXIGA 10 MG TABS tablet, TAKE 1 TABLET (10MG  TOTAL) BY MOUTH DAILY., Disp: 90 tablet, Rfl: 0   ferrous sulfate 325 (65 FE) MG tablet, Take 325 mg by mouth daily with breakfast., Disp: , Rfl:    Fluticasone-Umeclidin-Vilant (TRELEGY ELLIPTA) 200-62.5-25 MCG/ACT AEPB, Inhale 1 puff into the lungs daily., Disp: 28 each, Rfl: 6   metFORMIN (GLUCOPHAGE) 1000 MG tablet, Take 1 tablet (1,000 mg total) by mouth 2 (two) times daily with a meal., Disp: 180 tablet, Rfl: 1   Multiple Vitamin (MULTIVITAMIN WITH MINERALS) TABS tablet, Take 1 tablet by mouth daily., Disp: , Rfl:    nitroGLYCERIN (NITROSTAT) 0.4 MG SL tablet, Place 1 tablet (0.4 mg total) under the tongue every 5 (five) minutes x  3 doses as needed for chest pain., Disp: 25 tablet, Rfl: 2   omeprazole (PRILOSEC OTC) 20 MG tablet, Take 20 mg by mouth daily., Disp: , Rfl:    pantoprazole (PROTONIX) 40 MG tablet, Take 1 tablet (40 mg total) by mouth daily., Disp: 30 tablet, Rfl: 1   predniSONE (DELTASONE) 10 MG tablet, Take 2 tablets by mouth daily until better then take 1 tablet daily, Disp: 100 tablet, Rfl: 2   valsartan (DIOVAN) 40 MG tablet, Take 1 tablet (40 mg total) by mouth daily., Disp: 90 tablet, Rfl: 3

## 2021-04-10 NOTE — Addendum Note (Signed)
Addended by: Valerie Salts on: 04/10/2021 12:07 PM   Modules accepted: Orders

## 2021-04-10 NOTE — Patient Instructions (Addendum)
Stop taking advair inhaler  Start Trelegy Ellipta 1 puff daily - rinse mouth out after each use  Continue to use albuterol inhaler or nebulizer treatments every 4-6 hours as needed for cough, wheezing, chest tightness or shortness of breath.  Recommend updating pulmonary function tests at follow up in 1-2 months with Dr. Melvyn Novas.   Recommend considering adding biologic medication in the future to taper off prednisone. Medications that we can consider include Dupixent given his history of sinusitis and asthma.

## 2021-04-26 ENCOUNTER — Other Ambulatory Visit: Payer: Self-pay

## 2021-04-27 ENCOUNTER — Other Ambulatory Visit: Payer: Self-pay

## 2021-04-29 ENCOUNTER — Other Ambulatory Visit: Payer: Self-pay

## 2021-05-02 ENCOUNTER — Ambulatory Visit (INDEPENDENT_AMBULATORY_CARE_PROVIDER_SITE_OTHER): Payer: No Typology Code available for payment source | Admitting: Family Medicine

## 2021-05-02 ENCOUNTER — Other Ambulatory Visit: Payer: Self-pay

## 2021-05-02 VITALS — BP 116/80 | HR 81 | Ht 68.0 in | Wt 147.0 lb

## 2021-05-02 DIAGNOSIS — M7989 Other specified soft tissue disorders: Secondary | ICD-10-CM

## 2021-05-02 DIAGNOSIS — D509 Iron deficiency anemia, unspecified: Secondary | ICD-10-CM

## 2021-05-02 DIAGNOSIS — I5020 Unspecified systolic (congestive) heart failure: Secondary | ICD-10-CM

## 2021-05-02 NOTE — Patient Instructions (Addendum)
It was great to meet you!  We are checking some labs today. I will send you a MyChart message or call with the results. Depending on the results, we may prescribe a fluid pill to help with the leg swelling until you see cardiology.  In the meantime, I'd like you to start wearing compression stockings to help with the swelling in your feet. You can buy these at the pharmacy. They should be somewhat difficult to put on.  Be sure to elevate your legs for 15-30 minutes at a time, several times throughout the day.  Minimize salt/sodium intake (no more than 2g or 2000mg  sodium per day).  Please discuss your feet swelling with your cardiologist at your upcoming appointment as well.  Take care,  Dr. [facility]

## 2021-05-02 NOTE — Progress Notes (Signed)
° ° °  SUBJECTIVE:   CHIEF COMPLAINT / HPI:   Bilateral Feet Swelling -Started 7 or 8 days ago -L slightly more than R -Involves only the ankles/proximal feet. Does not extend into his shins or calves -Fluctuates in severity -Worse in the afternoon -Improves after he elevates his legs -He denies pain -No known injury or trauma -No shortness of breath or orthopnea -No recent dietary changes -Excellent medication compliance (not on a diuretic) -Takes chronic prednisone for asthma and did have a dose increase ~2-3 weeks ago, is now tapering back down.   Last echo 12/28/2020 showed EF 30-35% (also regional wall abnormalities c/w extensive LAD infarction, mild LVH, grade I diastolic dysfxn)  PERTINENT  PMH / PSH: CAD s/p MI, hx v-tach s/p ICD placement, HFrEF, T2DM, COPD  OBJECTIVE:   BP 116/80    Pulse 81    Ht 5\' 8"  (1.727 m)    Wt 147 lb (66.7 kg)    SpO2 96%    BMI 22.35 kg/m   Gen: alert, well-appearing, NAD Resp: normal work of breathing on room air Ext: trace pitting edema of bilateral ankles (L=R), no increased warmth, no erythema, no tenderness to palpation, 2+ DP pulses bilaterally Neuro: grossly intact  ASSESSMENT/PLAN:   Leg swelling Patient with 1 week of bilateral ankle swelling in the absence of other symptoms. Trace pitting edema noted on exam today. Patient not in acute heart failure exacerbation although weight is up slightly. May be retaining somewhat in the setting of recent increase in prednisone dose vs subtle dietary changes. May also be a component of venous insufficiency. No concern for DVT. Will check CBC, CMP, TSH today to evaluate for worsening anemia,  kidney/liver dysfunction, or thyroid abnormality. Pending lab results, could consider Lasix prn until he sees cardiology next week. Advised compression stockings and leg elevation in the meantime.    Alcus Dad, MD [place]

## 2021-05-03 DIAGNOSIS — M7989 Other specified soft tissue disorders: Secondary | ICD-10-CM | POA: Insufficient documentation

## 2021-05-03 LAB — CBC
Hematocrit: 39.6 % (ref 37.5–51.0)
Hemoglobin: 12.7 g/dL — ABNORMAL LOW (ref 13.0–17.7)
MCH: 23.8 pg — ABNORMAL LOW (ref 26.6–33.0)
MCHC: 32.1 g/dL (ref 31.5–35.7)
MCV: 74 fL — ABNORMAL LOW (ref 79–97)
Platelets: 198 10*3/uL (ref 150–450)
RBC: 5.33 x10E6/uL (ref 4.14–5.80)
RDW: 16.2 % — ABNORMAL HIGH (ref 11.6–15.4)
WBC: 12.2 10*3/uL — ABNORMAL HIGH (ref 3.4–10.8)

## 2021-05-03 LAB — COMPREHENSIVE METABOLIC PANEL
ALT: 24 IU/L (ref 0–44)
AST: 21 IU/L (ref 0–40)
Albumin/Globulin Ratio: 1.6 (ref 1.2–2.2)
Albumin: 4.1 g/dL (ref 3.8–4.8)
Alkaline Phosphatase: 50 IU/L (ref 44–121)
BUN/Creatinine Ratio: 17 (ref 10–24)
BUN: 15 mg/dL (ref 8–27)
Bilirubin Total: 0.3 mg/dL (ref 0.0–1.2)
CO2: 25 mmol/L (ref 20–29)
Calcium: 9.8 mg/dL (ref 8.6–10.2)
Chloride: 101 mmol/L (ref 96–106)
Creatinine, Ser: 0.9 mg/dL (ref 0.76–1.27)
Globulin, Total: 2.6 g/dL (ref 1.5–4.5)
Glucose: 175 mg/dL — ABNORMAL HIGH (ref 70–99)
Potassium: 4.5 mmol/L (ref 3.5–5.2)
Sodium: 142 mmol/L (ref 134–144)
Total Protein: 6.7 g/dL (ref 6.0–8.5)
eGFR: 97 mL/min/{1.73_m2} (ref >59)

## 2021-05-03 LAB — TSH: TSH: 0.729 u[IU]/mL (ref 0.450–4.500)

## 2021-05-03 NOTE — Assessment & Plan Note (Signed)
Patient with 1 week of bilateral ankle swelling in the absence of other symptoms. Trace pitting edema noted on exam today. Patient not in acute heart failure exacerbation although weight is up slightly. May be retaining somewhat in the setting of recent increase in prednisone dose vs subtle dietary changes. May also be a component of venous insufficiency. No concern for DVT. Will check CBC, CMP, TSH today to evaluate for worsening anemia,  kidney/liver dysfunction, or thyroid abnormality. Pending lab results, could consider Lasix prn until he sees cardiology next week. Advised compression stockings and leg elevation in the meantime.

## 2021-05-06 ENCOUNTER — Telehealth: Payer: Self-pay | Admitting: Licensed Clinical Social Worker

## 2021-05-06 NOTE — Telephone Encounter (Signed)
Noted pt application processed by [facility] office. Missing several documents. The following email was sent to pt daughter ([email or web address]) who was assisting with completing application.   Good Morning [name], Just wanted to let you know that your father's application was processed but is missing a few items.  They have sent him this letter in the mail which gives a bit more clarity. If you have any other questions let me know! The items are due by 2/2, if you need me to send them I can do so, if you'd like to send them directly you also can do so by letting me know first and then mailing them to: [place]: Customer Service  [address] [place], [place], [place], [address].   [name],  You recently applied for financial assistance with [facility]. Additional documentation is needed to complete your application. Please refer to the information below as to the documentation that is required to complete the processing of your application.   REQUIRED DOCUMENTS PROOF OF ASSETS:  " Last 90 days of complete (all pages showing all transactions) checking and savings statements (this includes ALL bank accounts in the patient's AND spouse's name). Cannot accept [employer] or running ledgers. Must be actual statements, or legible copies of the actual statements. (WE WILL NEED YOUR TRUIST STATEMENTS ENDING 02/07/21 & 03/10/21. IN ADDITION, YOUR TAX RETURN SHOWS YOUR FEDERAL REFUND WAS DIRECT DEPOSITED INTO A TRUIST ACCOUNT ENDING IN [phone]. WE WILL NEED THE THREE LATEST STATEMENTS FOR THAT ACCOUNT AS WELL)  This information will need to be provided by 05/23/21. Otherwise, your application will be denied, and will remain in a denied status for 6 months. For questions or concerns, please call Customer Service at [phone], Monday - Thursday, 8am - 6pm, or Friday, [place], MSW,  [email or web address] Work Cell: [place] [facility] [place]

## 2021-05-07 ENCOUNTER — Ambulatory Visit (INDEPENDENT_AMBULATORY_CARE_PROVIDER_SITE_OTHER): Payer: No Typology Code available for payment source

## 2021-05-07 DIAGNOSIS — I472 Ventricular tachycardia, unspecified: Secondary | ICD-10-CM

## 2021-05-07 LAB — CUP PACEART REMOTE DEVICE CHECK
Battery Remaining Longevity: 180 mo
Battery Remaining Percentage: 100 %
Brady Statistic RV Percent Paced: 0 %
Date Time Interrogation Session: 20230117042400
HighPow Impedance: 79 Ohm
Implantable Lead Implant Date: 20221017
Implantable Lead Location: 753860
Implantable Lead Model: 138
Implantable Lead Serial Number: 304145
Implantable Pulse Generator Implant Date: 20221017
Lead Channel Impedance Value: 469 Ohm
Lead Channel Setting Pacing Amplitude: 3.5 V
Lead Channel Setting Pacing Pulse Width: 0.4 ms
Lead Channel Setting Sensing Sensitivity: 0.5 mV
Pulse Gen Serial Number: 213143

## 2021-05-10 ENCOUNTER — Encounter: Payer: Self-pay | Admitting: Internal Medicine

## 2021-05-10 ENCOUNTER — Telehealth: Payer: Self-pay | Admitting: Licensed Clinical Social Worker

## 2021-05-10 ENCOUNTER — Ambulatory Visit: Payer: No Typology Code available for payment source | Admitting: Internal Medicine

## 2021-05-10 ENCOUNTER — Other Ambulatory Visit: Payer: Self-pay

## 2021-05-10 VITALS — BP 114/60 | HR 64 | Ht 68.0 in | Wt 146.8 lb

## 2021-05-10 DIAGNOSIS — Z95810 Presence of automatic (implantable) cardiac defibrillator: Secondary | ICD-10-CM | POA: Insufficient documentation

## 2021-05-10 DIAGNOSIS — I255 Ischemic cardiomyopathy: Secondary | ICD-10-CM

## 2021-05-10 NOTE — Telephone Encounter (Signed)
LCSW called and spoke with pt daughter [name] who has been assisting him w/ completing CAFA. Introduced self, role, reason for call. She shares that she has been in contact with her dad since my email and he is working on getting the formal statements for his accounts. She confirmed the other account noted had been closed- therefore I encouraged them to get a letter from bank noting when that account was closed. Pt daughter states understanding, I let her know I remain available and will f/u again before 2/2 to make sure no additional questions/concerns.   [name], MSW, [place]  [phone]- work cell phone (preferred) [phone]- desk phone

## 2021-05-10 NOTE — Patient Instructions (Addendum)
Medication Instructions:  Your physician recommends that you continue on your current medications as directed. Please refer to the Current Medication list given to you today.  Labwork: None ordered.  Testing/Procedures: None ordered.  Follow-Up: Your physician wants you to follow-up in: one year with Theodore Peru, MD or one of the following Advanced Practice Providers on your designated Care Team:   [name], [name], Theodore  Remote monitoring is used to monitor your ICD from home. This monitoring reduces the number of office visits required to check your device to one time per year. It allows [place] to keep an eye on the functioning of your device to ensure it is working properly. You are scheduled for a device check from home on 08/06/2021. You may send your transmission at any time that day. If you have a wireless device, the transmission will be sent automatically. After your physician reviews your transmission, you will receive a postcard with your next transmission date.  Any Other Special Instructions Will Be Listed Below (If Applicable).  If you need a refill on your cardiac medications before your next appointment, please call your pharmacy.

## 2021-05-13 NOTE — Progress Notes (Signed)
HPI [name] returns today for follow-up.  He is a very pleasant 61 year old man with a history of coronary artery disease status post remote LAD myocardial infarction status post drug-eluting stent.  The patient initially had moderate left ventricular dysfunction but over the years has developed worsening EF.  He sustained an inferior MI back in March 2022 with PCI and stent at that time to the RCA.  Despite guideline directed medical therapy he has had persistent left ventricular dysfunction.  In addition the patient has had ventricular tachycardia.  He underwent ICD insertion approximately 3 months ago.  He has done well in the interim with no chest pain or shortness of breath.  No syncope.  No ICD discharges. No Known Allergies   Current Outpatient Medications  Medication Sig Dispense Refill   acetaminophen (TYLENOL) 500 MG tablet Take 2 tablets (1,000 mg total) by mouth every 8 (eight) hours. (Patient taking differently: Take 1,000 mg by mouth 2 (two) times daily as needed for moderate pain.) 30 tablet 1   albuterol (PROAIR HFA) 108 (90 Base) MCG/ACT inhaler INHALE 2 PUFFS INTO THE LUNGS EVERY 4 HOURS AS NEEDED FOR WHEEZING OR SHORTNESS OF BREATH (PLAN B). 1 each 1   albuterol (PROVENTIL) (2.5 MG/3ML) 0.083% nebulizer solution Take 3 mLs (2.5 mg total) by nebulization every 6 (six) hours as needed for wheezing or shortness of breath. 75 mL 3   aspirin EC 81 MG tablet Take 1 tablet (81 mg total) by mouth daily. 30 tablet 11   atorvastatin (LIPITOR) 80 MG tablet Take 1 tablet (80 mg total) by mouth at bedtime. 90 tablet 3   bisoprolol (ZEBETA) 5 MG tablet Take 0.5 tablets (2.5 mg total) by mouth daily. 45 tablet 3   calcium-vitamin D (OSCAL WITH D) 250-125 MG-UNIT tablet Take 1 tablet by mouth 2 (two) times daily.     clopidogrel (PLAVIX) 75 MG tablet Take 1 tablet (75 mg total) by mouth daily with breakfast. 90 tablet 3   FARXIGA 10 MG TABS tablet TAKE 1 TABLET (10MG  TOTAL) BY MOUTH  DAILY. 90 tablet 0   ferrous sulfate 325 (65 FE) MG tablet Take 325 mg by mouth daily with breakfast.     Fluticasone-Umeclidin-Vilant (TRELEGY ELLIPTA) 200-62.5-25 MCG/ACT AEPB Inhale 1 puff into the lungs daily. 28 each 6   Fluticasone-Umeclidin-Vilant (TRELEGY ELLIPTA) 200-62.5-25 MCG/ACT AEPB Inhale 1 puff into the lungs daily. 1 each 0   metFORMIN (GLUCOPHAGE) 1000 MG tablet Take 1 tablet (1,000 mg total) by mouth 2 (two) times daily with a meal. 180 tablet 1   Multiple Vitamin (MULTIVITAMIN WITH MINERALS) TABS tablet Take 1 tablet by mouth daily.     nitroGLYCERIN (NITROSTAT) 0.4 MG SL tablet Place 1 tablet (0.4 mg total) under the tongue every 5 (five) minutes x 3 doses as needed for chest pain. 25 tablet 2   omeprazole (PRILOSEC OTC) 20 MG tablet Take 20 mg by mouth daily.     pantoprazole (PROTONIX) 40 MG tablet Take 1 tablet (40 mg total) by mouth daily. 30 tablet 1   predniSONE (DELTASONE) 10 MG tablet Take 2 tablets by mouth daily until better then take 1 tablet daily 100 tablet 2   valsartan (DIOVAN) 40 MG tablet Take 1 tablet (40 mg total) by mouth daily. 90 tablet 3   No current facility-administered medications for this visit.     Past Medical History:  Diagnosis Date   Asthma    CAD (coronary artery disease)  a. CAD s/p anterior MI in 2008 treated with DES to the LAD.   Cataract 2018   both eyes   Cellulitis of leg, right 10/2014   COPD (chronic obstructive pulmonary disease) ([place])    Dental caries    Diabetes mellitus    TYPE 2   GERD (gastroesophageal reflux disease)    Hyperlipidemia    Hypertension 2017   Ischemic cardiomyopathy    Leukocytosis    noted on labs   Microcytic anemia    Myocardial infarction [facility]) 2008   Reactive airway disease    Thyroid nodule    biopsy negative 2006    ROS:   All systems reviewed and negative except as noted in the HPI.   Past Surgical History:  Procedure Laterality Date   CORONARY STENT INTERVENTION N/A 09/20/2020    Procedure: CORONARY STENT INTERVENTION;  Surgeon: Lorretta Harp, MD;  Location: [place] [facility];  Service: Cardiovascular;  Laterality: N/A;   CORONARY STENT PLACEMENT     ESOPHAGOGASTRODUODENOSCOPY N/A 03/29/2017   Procedure: ESOPHAGOGASTRODUODENOSCOPY (EGD);  Surgeon: Milus Banister, MD;  Location: [facility];  Service: Endoscopy;  Laterality: N/A;   ESOPHAGOGASTRODUODENOSCOPY N/A 03/31/2018   Procedure: ESOPHAGOGASTRODUODENOSCOPY (EGD);  Surgeon: Doran Stabler, MD;  Location: [place];  Service: Gastroenterology;  Laterality: N/A;   FOREIGN BODY REMOVAL N/A 03/31/2018   Procedure: FOREIGN BODY REMOVAL;  Surgeon: Doran Stabler, MD;  Location: [place];  Service: Gastroenterology;  Laterality: N/A;   ICD IMPLANT N/A 02/04/2021   Procedure: ICD IMPLANT;  Surgeon: Evans Lance, MD;  Location: [place] [facility];  Service: Cardiovascular;  Laterality: N/A;   LEFT HEART CATH AND CORONARY ANGIOGRAPHY N/A 09/20/2020   Procedure: LEFT HEART CATH AND CORONARY ANGIOGRAPHY;  Surgeon: Lorretta Harp, MD;  Location: [place] [facility];  Service: Cardiovascular;  Laterality: N/A;   solitary pulm and thryoid nodules       Family History  Problem Relation Age of Onset   Diabetes Mother    Heart attack Brother 37   Colon cancer Neg Hx    Esophageal cancer Neg Hx    Rectal cancer Neg Hx    Stomach cancer Neg Hx      Social History   Socioeconomic History   Marital status: Married    Spouse name: Not on file   Number of children: 3   Years of education: Not on file   Highest education level: Not on file  Occupational History   Not on file  Tobacco Use   Smoking status: Former    Packs/day: 0.50    Years: 22.00    Pack years: 11.00    Types: Cigarettes    Quit date: 04/21/2006    Years since quitting: 15.0   Smokeless tobacco: Never   Tobacco comments:    quit 10 years ago  Vaping Use   Vaping Use: Never used  Substance and Sexual Activity   Alcohol  use: No    Alcohol/week: 0.0 standard drinks   Drug use: No   Sexual activity: Yes    Birth control/protection: Condom  Other Topics Concern   Not on file  Social History Narrative   Lives with wife; recently moved to [place] from [place] due to health problems. Has no means of affording meds currently + tobacco 1/2ppd x 53yrs, no ETOH no drugs.   Social Determinants of Health   Financial Resource Strain: High Risk   Difficulty of Paying Living Expenses: Hard  Food Insecurity: No Food Insecurity   Worried About Charity fundraiser in the Last Year: Never true   Ran Out of Food in the Last Year: Never true  Transportation Needs: No Transportation Needs   Lack of Transportation (Medical): No   Lack of Transportation (Non-Medical): No  Physical Activity: Not on file  Stress: Not on file  Social Connections: Not on file  Intimate Partner Violence: Not on file     BP 114/60    Pulse 64    Ht 5\' 8"  (1.727 m)    Wt 146 lb 12.8 oz (66.6 kg)    SpO2 95%    BMI 22.32 kg/m   Physical Exam:  Well appearing NAD HEENT: Unremarkable Neck:  No JVD, no thyromegally Lymphatics:  No adenopathy Back:  No CVA tenderness Lungs:  Clear with well healed ICD incision. HEART:  Regular rate rhythm, no murmurs, no rubs, no clicks Abd:  soft, positive bowel sounds, no organomegally, no rebound, no guarding Ext:  2 plus pulses, no edema, no cyanosis, no clubbing Skin:  No rashes no nodules Neuro:  CN II through XII intact, motor grossly intact  DEVICE  Normal device function.  See PaceArt for details.   Assess/Plan:  VT - he is s/p ICD and has not any more ventricular arrhythmias. ICD - his [place] [employer] single chamber ICD is working normally with over 14 years of battery longevity. ICM - he denies anginal symptoms. We will follow.  Carleene Overlie Kenzel Ruesch,MD

## 2021-05-20 NOTE — Progress Notes (Signed)
Remote ICD transmission.   

## 2021-05-21 ENCOUNTER — Telehealth: Payer: Self-pay | Admitting: Licensed Clinical Social Worker

## 2021-05-21 NOTE — Telephone Encounter (Signed)
LCSW reached out to pt this afternoon for f/u regarding additional documents requested by patient accounting for [employer] . No answer at [phone], message left noting above f/u. Remain available, will re-attempt tomorrow.   Westley Hummer, MSW, [place]  [phone]- work cell phone (preferred) [phone]- desk phone

## 2021-05-22 ENCOUNTER — Other Ambulatory Visit: Payer: Self-pay

## 2021-05-22 ENCOUNTER — Telehealth: Payer: Self-pay | Admitting: Licensed Clinical Social Worker

## 2021-05-22 DIAGNOSIS — E118 Type 2 diabetes mellitus with unspecified complications: Secondary | ICD-10-CM

## 2021-05-22 MED ORDER — DAPAGLIFLOZIN PROPANEDIOL 10 MG PO TABS: ORAL_TABLET | ORAL | 0 refills | Status: DC

## 2021-05-22 NOTE — Telephone Encounter (Signed)
LCSW attempted pt again today, was able to reach him via telephone. Pt states he thinks he was able to send in missing requested documents but was not sure, gave permission to contact his daughter Presley Raddle. I reached [name] via telephone, she shares that she believes pt had mailed items but would confirm w/ pt. She called me back to let me know that pt turned in bank statement and statement from bank regarding closed account to financial counseling on Monday. I have let Lanny Hurst w/ Patient Accounts know the above have been sent before deadline of 2/2.   Westley Hummer, MSW, [place]  [phone]- work cell phone (preferred) [phone]- desk phone

## 2021-05-23 ENCOUNTER — Other Ambulatory Visit: Payer: Self-pay

## 2021-05-23 DIAGNOSIS — E119 Type 2 diabetes mellitus without complications: Secondary | ICD-10-CM

## 2021-05-23 MED ORDER — METFORMIN HCL 1000 MG PO TABS: 1000.0000 mg | ORAL_TABLET | Freq: Two times a day (BID) | ORAL | 1 refills | Status: DC

## 2021-05-29 ENCOUNTER — Other Ambulatory Visit: Payer: Self-pay

## 2021-05-30 ENCOUNTER — Other Ambulatory Visit: Payer: Self-pay

## 2021-05-30 ENCOUNTER — Ambulatory Visit (INDEPENDENT_AMBULATORY_CARE_PROVIDER_SITE_OTHER): Payer: No Typology Code available for payment source | Admitting: Pulmonary Disease

## 2021-05-30 DIAGNOSIS — J449 Chronic obstructive pulmonary disease, unspecified: Secondary | ICD-10-CM

## 2021-05-30 LAB — PULMONARY FUNCTION TEST
DL/VA % pred: 87 %
DL/VA: 3.94 ml/min/mmHg/L
DLCO cor % pred: 56 %
DLCO cor: 16.66 ml/min/mmHg
DLCO unc % pred: 52 %
DLCO unc: 15.69 ml/min/mmHg
FEF 25-75 Post: 0.97 L/sec
FEF 25-75 Pre: 0.81 L/sec
FEF2575-%Change-Post: 18 %
FEF2575-%Pred-Post: 37 %
FEF2575-%Pred-Pre: 31 %
FEV1-%Change-Post: 10 %
FEV1-%Pred-Post: 51 %
FEV1-%Pred-Pre: 46 %
FEV1-Post: 1.58 L
FEV1-Pre: 1.43 L
FEV1FVC-%Change-Post: 4 %
FEV1FVC-%Pred-Pre: 75 %
FEV6-%Change-Post: 6 %
FEV6-Post: 2.57 L
FEV6-Pre: 2.42 L
FEV6FVC-%Change-Post: 0 %
FVC-%Change-Post: 5 %
FVC-%Pred-Post: 65 %
FVC-%Pred-Pre: 62 %
FVC-Post: 2.57 L
FVC-Pre: 2.43 L
Post FEV1/FVC ratio: 61 %
Post FEV6/FVC ratio: 100 %
Pre FEV1/FVC ratio: 59 %
Pre FEV6/FVC Ratio: 100 %
RV % pred: 187 %
RV: 4.07 L
TLC % pred: 104 %
TLC: 6.92 L

## 2021-05-30 NOTE — Progress Notes (Signed)
Full PFT performed today. °

## 2021-05-30 NOTE — Patient Instructions (Signed)
Full PFT performed today. °

## 2021-05-31 ENCOUNTER — Telehealth: Payer: Self-pay | Admitting: Pharmacist

## 2021-05-31 ENCOUNTER — Encounter: Payer: Self-pay | Admitting: Internal Medicine

## 2021-05-31 ENCOUNTER — Ambulatory Visit (INDEPENDENT_AMBULATORY_CARE_PROVIDER_SITE_OTHER): Payer: No Typology Code available for payment source | Admitting: Internal Medicine

## 2021-05-31 DIAGNOSIS — J449 Chronic obstructive pulmonary disease, unspecified: Secondary | ICD-10-CM

## 2021-05-31 NOTE — Telephone Encounter (Addendum)
Patient is uninsured and will need to pursue patient assistance for Dupixent through [employer]. Patient did not complete paperwork at visit today.  ATC patient to review - he can stop by the clinic to complete or pharmacy team can mail the application. Left VM with patient requesting return call to determine next best steps  Knox Saliva, PharmD, MPH, BCPS Clinical Pharmacist (Rheumatology and Pulmonology)  ----- Message from Tanda Rockers, MD sent at 05/31/2021  9:04 AM EST ----- No insurance, needs dupixent ? Any options
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
## 2021-05-31 NOTE — Assessment & Plan Note (Signed)
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 2008  - PFT's 2007  FEV1  1.78 (50%) ratio 52 and 19% resp to saba, dlco 75% - d/c coreg 09/03/2013  - spirometry 12/18/2014 >  FEV1 1.04 (34%) ratio 53     > trial of dulera 200 2bid and prednisone 5 mg daily   - Spiriva respimat added 05/01/2015  - max gerd rx1/01/2016 > improved 06/12/2015   - FENO 12/21/2015  =   9  p am symb 160 rx - Spirometry 12/21/2015  FEV1 1.33 (47%)  Ratio 53 p am symb 160/spiriva / - Spirometry 06/12/2017  FEV1 1.43 (49%)  Ratio 51 p dulera and off spiriva xone week so leave off spriva - 07/13/2018  After extensive coaching inhaler device,  effectiveness =    90% with hfa > continue dulera 200 2bid and reduce pred floor to 5 mg  08/19/2019 Stable follow-up. Off ACEi, improved cough/chest pain. 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  Consultd pharmacy at our office to see if we can get him enrolled at no cost         Each maintenance medication was reviewed in detail including emphasizing most importantly the difference between maintenance and prns and under what circumstances the prns are to be triggered using an action plan format where appropriate.  Total time for H and P, chart review, counseling, reviewing dpi/hfa/neb device(s) and generating customized AVS unique to this office visit / same day charting = 24 min

## 2021-05-31 NOTE — Patient Instructions (Signed)
We will see if you qualify for Dupixent or another biologic to reduce or eliminated your need for prednisone    Please schedule a follow up visit in 3 months but call sooner if needed

## 2021-06-05 ENCOUNTER — Telehealth: Payer: Self-pay | Admitting: Licensed Clinical Social Worker

## 2021-06-05 NOTE — Telephone Encounter (Signed)
Noted pt approved for 100% financial assistance through [employer]. I have mailed letter of approval to pt along with a reminder to bring letter and [place] to all appointments. Approval valid 03/28/2021-09/26/2021.  Westley Hummer, MSW, [place]  [phone]- work cell phone (preferred) [phone]- desk phone

## 2021-06-06 NOTE — Telephone Encounter (Addendum)
ATC patient regarding [facility] paperwork. Unable to reach - left VM requesting return call  Will mail to patient with note to return to pharmacy team since I have been unable to reach patient at this time  Knox Saliva, PharmD, MPH, BCPS Clinical Pharmacist (Rheumatology and Pulmonology)

## 2021-06-11 ENCOUNTER — Telehealth: Payer: Self-pay | Admitting: Internal Medicine

## 2021-06-11 NOTE — Telephone Encounter (Signed)
Patient reports of concern with device incision. Device was placed 02/04/21. Reports area was red but now has turned black in color. Also reports of itching. Denies swelling, warmth, open areas or drainage.   Requested patient to send picture to [facility] email. Email address provided to patient.

## 2021-06-11 NOTE — Telephone Encounter (Signed)
°  1. Has your device fired? no  2. Is you device beeping? He says he thinks it is something wrong with his device,   3. Are you experiencing draining or swelling at device site? no  4. Are you calling to see if we received your device transmission? no  5. Have you passed out? no    Please route to [place]

## 2021-06-11 NOTE — Telephone Encounter (Signed)
Picture received. Patient agreeable to allow picture attached to medical record.  Advised patient I would recommend device clinic for wound check. Patient agreeable.   Appointment made 06/13/21 @ 10:15 am. Location, date and time discussed with patient.

## 2021-06-13 ENCOUNTER — Ambulatory Visit (INDEPENDENT_AMBULATORY_CARE_PROVIDER_SITE_OTHER): Payer: No Typology Code available for payment source

## 2021-06-13 ENCOUNTER — Other Ambulatory Visit: Payer: Self-pay

## 2021-06-13 DIAGNOSIS — I472 Ventricular tachycardia, unspecified: Secondary | ICD-10-CM

## 2021-06-13 MED ORDER — CEPHALEXIN 500 MG PO CAPS: 500.0000 mg | ORAL_CAPSULE | Freq: Two times a day (BID) | ORAL | 0 refills | Status: DC

## 2021-06-13 NOTE — Progress Notes (Signed)
Patient seen in device clinic for wound complaint over ICD incision. Red area noted over device area with skin that appeared peeling in nature. No draining or swelling noted. Denies fever or chills. Patient states the redness and skin peeling has been since implant. Dr. Curt Bears in to assess patient and verbal order obtained to start Kelfex 500 mg BID x5 days. Script sent to pharmacy. Wound care education provided to patient with verbal understanding.

## 2021-06-14 NOTE — Telephone Encounter (Signed)
Called patient regarding Dupixent application. He states he has completed application that was mailed to his home and was planning on dropping it off to the clinic today. He has been advised to let front desk know it is for the pharmacy team  Knox Saliva, PharmD, MPH, BCPS Clinical Pharmacist (Rheumatology and Pulmonology)

## 2021-06-17 NOTE — Telephone Encounter (Signed)
Pt portion received and placed in "PAP pending info" folder. Provider portion will be placed into Dr. Gustavus Bryant box for signature. Will await f/u.

## 2021-06-19 ENCOUNTER — Other Ambulatory Visit: Payer: Self-pay

## 2021-06-19 ENCOUNTER — Encounter (HOSPITAL_COMMUNITY): Payer: Self-pay | Admitting: *Deleted

## 2021-06-19 ENCOUNTER — Ambulatory Visit (HOSPITAL_COMMUNITY)
Admission: EM | Admit: 2021-06-19 | Discharge: 2021-06-19 | Disposition: A | Discharge status: Home / Self Care | Payer: Self-pay | Attending: Family Medicine | Admitting: Family Medicine

## 2021-06-19 ENCOUNTER — Telehealth: Payer: Self-pay

## 2021-06-19 DIAGNOSIS — R21 Rash and other nonspecific skin eruption: Secondary | ICD-10-CM

## 2021-06-19 MED ORDER — DOXYCYCLINE HYCLATE 100 MG PO TABS
100.0000 mg | ORAL_TABLET | Freq: Two times a day (BID) | ORAL | 0 refills | Status: DC
  Filled 2021-06-19: qty 14, 7d supply, fill #0

## 2021-06-19 MED ORDER — FLUOCINONIDE 0.05 % EX CREA
1.0000 "application " | TOPICAL_CREAM | Freq: Two times a day (BID) | CUTANEOUS | 0 refills | Status: DC
  Filled 2021-06-19: qty 30, 15d supply, fill #0

## 2021-06-19 NOTE — ED Triage Notes (Signed)
Pt reports he was at cardiologist 2-23-3 for infection at pace maker sit on Lt chest wall. Pt has an appt tomorrow with the cardiologist . Pt has a draining wound on Lt upper chest. ?

## 2021-06-19 NOTE — ED Provider Notes (Signed)
?[facility] ? ? ?[id number] ?06/19/21 Arrival Time: 2355 ? ?ASSESSMENT & PLAN: ? ?1. Rash and nonspecific skin eruption   ? ?Afebrile. No signs of cellulitis but yellowish crusting over healed chest scar. ?Feels antibiotic did help some. Will broaden coverage in addition to steroid cream secondary to reported itching. Appearance is similar to a Neosporin allergy but he reports nothing topical over past week or two. Similar areas on neck that itch. No sign of abscess formation. ? ?Begin: ?Meds ordered this encounter  ?Medications  ? doxycycline (VIBRA-TABS) 100 MG tablet  ?  Sig: Take 1 tablet (100 mg total) by mouth 2 (two) times daily.  ?  Dispense:  14 tablet  ?  Refill:  0  ? fluocinonide cream (LIDEX) 0.05 %  ?  Sig: Apply 1 application topically 2 (two) times daily.  ?  Dispense:  30 g  ?  Refill:  0  ? ?Will keep cardiology f/u tomorrow. ? ?Will follow up with PCP or here if worsening or failing to improve as anticipated. ?Reviewed expectations re: course of current medical issues. Questions answered. ?Outlined signs and symptoms indicating need for more acute intervention. ?Patient verbalized understanding. ?After Visit Summary given. ? ? ?SUBJECTIVE: ? ?[name] is a 61 y.o. male who presents with a skin complaint. Reports increasing erythema/irritation over ICD scar. Seen recently at cardiology office; Rx Keflex; finished two d ago. Feels it did help some. Afebrile. No drainage form area. Itching has increased. Also has noted two areas on sides of neck that itch. No new exposures. Does not wear jewelry. No OTC tx. ? ?OBJECTIVE: ?Vitals:  ? 06/19/21 1529  ?BP: 132/75  ?Pulse: 71  ?Resp: 18  ?Temp: 98.4 ?F (36.9 ?C)  ?SpO2: 98%  ?  ?General appearance: alert; no distress ?HEENT: [place]; AT ?Neck: supple with FROM ?Extremities: no edema; moves all extremities normally ?Skin: warm and dry; L upper chest wall with healed ICD scar; overlying yellowish crusting; surrounding approx 5x5 inch area of  bright red erythematous papular rash; slight warmth; no areas of bleeding or drainage; sides of neck with patches of irritated, dry, erythematous skin ?Psychological: alert and cooperative; normal mood and affect ? ?No Known Allergies ? ?Past Medical History:  ?Diagnosis Date  ? Asthma   ? CAD (coronary artery disease)   ? a. CAD s/p anterior MI in 2008 treated with DES to the LAD.  ? Cataract 2018  ? both eyes  ? Cellulitis of leg, right 10/2014  ? COPD (chronic obstructive pulmonary disease) ([place])   ? Dental caries   ? Diabetes mellitus   ? TYPE 2  ? GERD (gastroesophageal reflux disease)   ? Hyperlipidemia   ? Hypertension 2017  ? Ischemic cardiomyopathy   ? Leukocytosis   ? noted on labs  ? Microcytic anemia   ? Myocardial infarction [facility]) 2008  ? Reactive airway disease   ? Thyroid nodule   ? biopsy negative 2006  ? ?Social History  ? ?Socioeconomic History  ? Marital status: Married  ?  Spouse name: Not on file  ? Number of children: 3  ? Years of education: Not on file  ? Highest education level: Not on file  ?Occupational History  ? Not on file  ?Tobacco Use  ? Smoking status: Former  ?  Packs/day: 0.50  ?  Years: 22.00  ?  Pack years: 11.00  ?  Types: Cigarettes  ?  Quit date: 04/21/2006  ?  Years since quitting: 15.1  ?  Smokeless tobacco: Never  ? Tobacco comments:  ?  quit 10 years ago  ?Vaping Use  ? Vaping Use: Never used  ?Substance and Sexual Activity  ? Alcohol use: No  ?  Alcohol/week: 0.0 standard drinks  ? Drug use: No  ? Sexual activity: Yes  ?  Birth control/protection: Condom  ?Other Topics Concern  ? Not on file  ?Social History Narrative  ? Lives with wife; recently moved to [place] from [place] due to health problems. Has no means of affording meds currently + tobacco 1/2ppd x 45yrs, no ETOH no drugs.  ? ?Social Determinants of Health  ? ?Financial Resource Strain: High Risk  ? Difficulty of Paying Living Expenses: Hard  ?Food Insecurity: No Food Insecurity  ? Worried About Charity fundraiser in the  Last Year: Never true  ? Ran Out of Food in the Last Year: Never true  ?Transportation Needs: No Transportation Needs  ? Lack of Transportation (Medical): No  ? Lack of Transportation (Non-Medical): No  ?Physical Activity: Not on file  ?Stress: Not on file  ?Social Connections: Not on file  ?Intimate Partner Violence: Not on file  ? ?Family History  ?Problem Relation Age of Onset  ? Diabetes Mother   ? Heart attack Brother 3  ? Colon cancer Neg Hx   ? Esophageal cancer Neg Hx   ? Rectal cancer Neg Hx   ? Stomach cancer Neg Hx   ? ?Past Surgical History:  ?Procedure Laterality Date  ? CORONARY STENT INTERVENTION N/A 09/20/2020  ? Procedure: CORONARY STENT INTERVENTION;  Surgeon: Lorretta Harp, MD;  Location: [facility];  Service: Cardiovascular;  Laterality: N/A;  ? CORONARY STENT PLACEMENT    ? ESOPHAGOGASTRODUODENOSCOPY N/A 03/29/2017  ? Procedure: ESOPHAGOGASTRODUODENOSCOPY (EGD);  Surgeon: Milus Banister, MD;  Location: [facility];  Service: Endoscopy;  Laterality: N/A;  ? ESOPHAGOGASTRODUODENOSCOPY N/A 03/31/2018  ? Procedure: ESOPHAGOGASTRODUODENOSCOPY (EGD);  Surgeon: Doran Stabler, MD;  Location: [facility];  Service: Gastroenterology;  Laterality: N/A;  ? FOREIGN BODY REMOVAL N/A 03/31/2018  ? Procedure: FOREIGN BODY REMOVAL;  Surgeon: Doran Stabler, MD;  Location: [facility];  Service: Gastroenterology;  Laterality: N/A;  ? ICD IMPLANT N/A 02/04/2021  ? Procedure: ICD IMPLANT;  Surgeon: Evans Lance, MD;  Location: [facility];  Service: Cardiovascular;  Laterality: N/A;  ? LEFT HEART CATH AND CORONARY ANGIOGRAPHY N/A 09/20/2020  ? Procedure: LEFT HEART CATH AND CORONARY ANGIOGRAPHY;  Surgeon: Lorretta Harp, MD;  Location: [facility];  Service: Cardiovascular;  Laterality: N/A;  ? solitary pulm and thryoid nodules    ? ? ?  ?Vanessa Kick, MD ?06/19/21 1702 ? ?

## 2021-06-19 NOTE — Telephone Encounter (Signed)
The patient called stating the redness at his incision site is worse. He has an appointment for tomorrow. [place] told me to let him know since Dr. Lovena Le is here tomorrow, it will be best for him to keep his appointment tomorrow.  ?

## 2021-06-20 ENCOUNTER — Other Ambulatory Visit: Payer: Self-pay

## 2021-06-20 ENCOUNTER — Ambulatory Visit (INDEPENDENT_AMBULATORY_CARE_PROVIDER_SITE_OTHER): Payer: Self-pay

## 2021-06-20 DIAGNOSIS — I255 Ischemic cardiomyopathy: Secondary | ICD-10-CM

## 2021-06-20 NOTE — Progress Notes (Signed)
Patient presents to device clinic for wound re-check. Of note he presented to urgent care yesterday and was prescribed doxycycline and lidex cream as rash has worsened since he was prescribed Keflex 06/13/21. Dr. Lovena Le in to assess. Feels wound is not infected and this is a localized irritation (contact dermatitis) from original wound covering at implant. Patient is advised to wash area with mild soap and warm water twice daily and to apply lidex cream immediately after while skin is still moist. Patient is advised to contact device clinic immediately if area worsens or is not significantly improved within 10 days. Patient is provided device clinic contact. Verbalized understanding.  ? ? ? ? ? ? ?

## 2021-06-20 NOTE — Patient Instructions (Addendum)
Wash wound twice daily with mild soap and warm water. While the wound is damp, apply topical Lidex cream to entire rashed area including scab. Please call the device clinic if you develop fever or chills, or if your rash worsens [phone]. ?

## 2021-06-21 ENCOUNTER — Other Ambulatory Visit: Payer: Self-pay

## 2021-06-24 NOTE — Telephone Encounter (Signed)
Provider portion of Dupixent [employer] application signed by Dr. Melvyn Novas today. Submitted Patient Assistance Application to [place] for [place] along with provider portion, patient portion, and med list. Patient is uninsured so no PA approval needed. Will update patient when we receive a response. ? ?Fax# [phone] ?Phone# [phone] ? ?Knox Saliva, PharmD, MPH, BCPS ?Clinical Pharmacist (Rheumatology and Pulmonology) ? ?

## 2021-06-25 NOTE — Telephone Encounter (Signed)
Received fax from [employer] confirming that application had been received and was currently under review. Will continue to await determination. ?

## 2021-06-28 ENCOUNTER — Encounter: Payer: Self-pay | Admitting: Family Medicine

## 2021-06-28 ENCOUNTER — Other Ambulatory Visit: Payer: Self-pay

## 2021-06-28 ENCOUNTER — Ambulatory Visit (INDEPENDENT_AMBULATORY_CARE_PROVIDER_SITE_OTHER): Payer: Self-pay | Admitting: Family Medicine

## 2021-06-28 VITALS — BP 139/68 | HR 71 | Ht 68.0 in | Wt 150.0 lb

## 2021-06-28 DIAGNOSIS — E118 Type 2 diabetes mellitus with unspecified complications: Secondary | ICD-10-CM

## 2021-06-28 DIAGNOSIS — Z1211 Encounter for screening for malignant neoplasm of colon: Secondary | ICD-10-CM

## 2021-06-28 DIAGNOSIS — R21 Rash and other nonspecific skin eruption: Secondary | ICD-10-CM

## 2021-06-28 LAB — POCT GLYCOSYLATED HEMOGLOBIN (HGB A1C): HbA1c, POC (controlled diabetic range): 7 % (ref 0.0–7.0)

## 2021-06-28 MED ORDER — PREDNISONE 10 MG PO TABS: 30.0000 mg | ORAL_TABLET | Freq: Every day | ORAL | 0 refills | Status: AC

## 2021-06-28 NOTE — Patient Instructions (Signed)
I am prescribing 30 mg of prednisone for you to use in addition to the 10 mg you are taking each day.  Use this for 5 days and then return to the 10 mg daily.  I have added doxycycline to your allergy list.  This should improve the rash.  You can continue taking your antihistamine and eczema cream. ? ?If you develop any other concerns please let us know ? ?Your diabetes is under good control and should continue current medications.  We should follow-up in about 3 months for recheck. ? ?I have sent in the referral for GI for your screening colonoscopy. ?

## 2021-06-28 NOTE — Progress Notes (Signed)
? ? ?  SUBJECTIVE:  ? ?CHIEF COMPLAINT / HPI:  ? ?Diabetic Follow Up: ?Patient is a 61 y.o. male who present today for diabetic follow up.  ? ?Patient endorses no problems ? ?Home medications include: Metformin 1000 twice daily, Farxiga 10 mg daily ?Patient endorses taking these medications as prescribed. ? ?Most recent A1Cs:  ?Lab Results  ?Component Value Date  ? HGBA1C 7.0 06/28/2021  ? HGBA1C 7.4 (H) 09/22/2020  ? HGBA1C 7.5 (A) 08/07/2020  ? ?Last Microalbumin, LDL, Creatinine: ?Lab Results  ?Component Value Date  ? [place] 20 11/09/2020  ? CREATININE 0.90 05/02/2021  ? ? ?Rash: ?He had a pacemaker placed in October but two weeks ago he had some blisters with purulence appear at the site of the pace maker. He was seen in urgent care on 3/1 and diagnosed with cellulitis and given doxycycline which he states resolved the infection. About 3 days into the doxycyline he noticed a red itchy rash on his chest and right arm, back and legs. He states he had a previous rash like this after an antibiotic years ago but cannot remember which one it was or what it was for. He tried an eczema cream which helped with the rash itchiness.  He is taking prednisone 10 mg daily for asthma which is maintained by as allergist. ? ? ?PERTINENT  PMH / PSH: History of MI ? ?OBJECTIVE:  ? ?BP 139/68   Pulse 71   Ht '5\' 8"'$  (1.727 m)   Wt 150 lb (68 kg)   SpO2 100%   BMI 22.81 kg/m?   ? ?General: NAD, pleasant, able to participate in exam ?Respiratory: No respiratory distress ?Abdomen: Bowel sounds present, nontender, nondistended, no hepatosplenomegaly. ?Extremities: no edema or cyanosis. ?Skin: warm and dry, diffuse erythematous pruritic rash present on the left chest, back, and right arm.  Left chest wound appears well-healed with no signs of drainage or open infection.  Is not warm to the touch.  See photos below ?Neuro: alert, no obvious focal deficits ?Psych: Normal affect and mood ?Left chest: ? ?Right arm: ? ?The below photos  from the patient from before he started his antibiotic: ? ? ? ?ASSESSMENT/PLAN:  ? ? ?Type 2 diabetes: ?A1c today of 7.0, previously 7.4, Home meds include metformin 1000 mg twice daily, Farxiga 10 mg daily. Follow up in 3 months.  ? ?Health maintenance items include recommendation for colonoscopy.  Have placed this referral. ? ?Rash: ?Likely due to doxycycline as it started shortly after the antibiotic course began.  The infection he was given the course for seems to be resolved.  He is completed the course but has the rash which popped up 3 days after starting the treatment.  He has tried antihistamines and eczema creams without significant relief of the pruritus.  He does take chronic 10 mg daily prednisone.  Discussed case with Dr. Erin Hearing who agreed with trial of 30 mg prednisone in addition to the TN for 40 daily for 5 days returning to the 10 mg prednisone after this.  Can consider H2 blocker as well.  Patient will let me know how he is improving.  We will add doxycycline to his allergy list. ? ?Lurline Del, DO ?[place]  ? ? ? ?

## 2021-07-01 ENCOUNTER — Encounter: Payer: Self-pay | Admitting: Gastroenterology

## 2021-07-02 ENCOUNTER — Other Ambulatory Visit: Payer: Self-pay

## 2021-07-03 ENCOUNTER — Other Ambulatory Visit: Payer: Self-pay

## 2021-07-04 NOTE — Telephone Encounter (Signed)
Reached out to pt and informed him that his application had been processed and that he needed to reach out to [employer] and express financial hardship. Advised him to mention that he has no insurance (prescription or otherwise), hence the need for financial assistance. Pt verbalized understanding. Requested he reach back out to [place] here at the clinic should he have any additional questions or concerns. ?

## 2021-07-11 NOTE — Telephone Encounter (Signed)
Patient scheduled for Dupixent new start on 07/17/21 ? ?Knox Saliva, PharmD, MPH, BCPS ?Clinical Pharmacist (Rheumatology and Pulmonology) ?

## 2021-07-11 NOTE — Telephone Encounter (Signed)
Received a fax from  [place] regarding an approval for [place] patient assistance from 07/11/2021 to 07/12/2022. Approval letter sent to scan center. ?

## 2021-07-17 ENCOUNTER — Other Ambulatory Visit: Payer: Self-pay | Admitting: Pharmacist

## 2021-07-18 NOTE — Telephone Encounter (Signed)
Patient rescheduled Dupixent new start to 08/12/21. ? ?Knox Saliva, PharmD, MPH, BCPS ?Clinical Pharmacist (Rheumatology and Pulmonology) ?

## 2021-08-02 ENCOUNTER — Other Ambulatory Visit: Payer: Self-pay

## 2021-08-06 ENCOUNTER — Ambulatory Visit (INDEPENDENT_AMBULATORY_CARE_PROVIDER_SITE_OTHER): Payer: Self-pay

## 2021-08-06 DIAGNOSIS — I255 Ischemic cardiomyopathy: Secondary | ICD-10-CM

## 2021-08-06 LAB — CUP PACEART REMOTE DEVICE CHECK
Battery Remaining Longevity: 174 mo
Battery Remaining Percentage: 100 %
Brady Statistic RV Percent Paced: 0 %
Date Time Interrogation Session: 20230418041300
HighPow Impedance: 88 Ohm
Implantable Lead Implant Date: 20221017
Implantable Lead Location: 753860
Implantable Lead Model: 138
Implantable Lead Serial Number: 304145
Implantable Pulse Generator Implant Date: 20221017
Lead Channel Impedance Value: 483 Ohm
Lead Channel Setting Pacing Amplitude: 2.5 V
Lead Channel Setting Pacing Pulse Width: 0.4 ms
Lead Channel Setting Sensing Sensitivity: 0.5 mV
Pulse Gen Serial Number: 213143

## 2021-08-12 ENCOUNTER — Ambulatory Visit (INDEPENDENT_AMBULATORY_CARE_PROVIDER_SITE_OTHER): Payer: No Typology Code available for payment source | Admitting: Pharmacist

## 2021-08-12 DIAGNOSIS — Z7189 Other specified counseling: Secondary | ICD-10-CM

## 2021-08-12 DIAGNOSIS — J449 Chronic obstructive pulmonary disease, unspecified: Secondary | ICD-10-CM

## 2021-08-12 MED ORDER — DUPIXENT 300 MG/2ML ~~LOC~~ SOAJ: 300.0000 mg | SUBCUTANEOUS | 1 refills | Status: DC

## 2021-08-12 NOTE — Progress Notes (Signed)
? ?HPI ?Patient presents today to [facility] to see pharmacy team for [place] new start for asthma-COPD overlap. Previously taking Xolair but did not self-administer  Past medical history includes NSTEMI, CAD, cardiomyopathy, HFrEF, GERD, former smoker. ? ?He is currently oral corticosteroid-dependent. He reports that he tried to stop taking prednisone for 3 days on his own and his blood pressure decreased. Reports SBP fell to 80s and 60s. ? ?Respiratory Medications ?Current regimen: Trelegy 200/62.5/25mcg (1 puff once daily), prednisone 64m daly ?Patient reports no known adherence challenges ? ?OBJECTIVE ?Allergies  ?Allergen Reactions  ? Doxycycline Rash  ? ? ?Outpatient Encounter Medications as of 08/12/2021  ?Medication Sig Note  ? acetaminophen (TYLENOL) 500 MG tablet Take 2 tablets (1,000 mg total) by mouth every 8 (eight) hours. (Patient taking differently: Take 1,000 mg by mouth 2 (two) times daily as needed for moderate pain.)   ? albuterol (PROAIR HFA) 108 (90 Base) MCG/ACT inhaler INHALE 2 PUFFS INTO THE LUNGS EVERY 4 HOURS AS NEEDED FOR WHEEZING OR SHORTNESS OF BREATH (PLAN B).   ? albuterol (PROVENTIL) (2.5 MG/3ML) 0.083% nebulizer solution Take 3 mLs (2.5 mg total) by nebulization every 6 (six) hours as needed for wheezing or shortness of breath.   ? aspirin EC 81 MG tablet Take 1 tablet (81 mg total) by mouth daily.   ? atorvastatin (LIPITOR) 80 MG tablet Take 1 tablet (80 mg total) by mouth at bedtime.   ? bisoprolol (ZEBETA) 5 MG tablet Take 0.5 tablets (2.5 mg total) by mouth daily.   ? calcium-vitamin D (OSCAL WITH D) 250-125 MG-UNIT tablet Take 1 tablet by mouth 2 (two) times daily.   ? cephALEXin (KEFLEX) 500 MG capsule Take 1 capsule (500 mg total) by mouth 2 (two) times daily.   ? clopidogrel (PLAVIX) 75 MG tablet Take 1 tablet (75 mg total) by mouth daily with breakfast.   ? dapagliflozin propanediol (FARXIGA) 10 MG TABS tablet TAKE 1 TABLET (10MG TOTAL) BY MOUTH DAILY.   ? ferrous  sulfate 325 (65 FE) MG tablet Take 325 mg by mouth daily with breakfast.   ? fluocinonide cream (LIDEX) 09.48% Apply 1 application topically 2 (two) times daily.   ? Fluticasone-Umeclidin-Vilant (TRELEGY ELLIPTA) 200-62.5-25 MCG/ACT AEPB Inhale 1 puff into the lungs daily.   ? metFORMIN (GLUCOPHAGE) 1000 MG tablet Take 1 tablet (1,000 mg total) by mouth 2 (two) times daily with a meal.   ? Multiple Vitamin (MULTIVITAMIN WITH MINERALS) TABS tablet Take 1 tablet by mouth daily.   ? nitroGLYCERIN (NITROSTAT) 0.4 MG SL tablet Place 1 tablet (0.4 mg total) under the tongue every 5 (five) minutes x 3 doses as needed for chest pain.   ? omeprazole (PRILOSEC OTC) 20 MG tablet Take 20 mg by mouth daily. 03/06/2021: Pt confirmed to be taking both omeprazole 20 qd (OTC) and pantoprazole 40 qd. I recommended [facility] to confirm if this is ok and to update ppmh with any changes  ? predniSONE (DELTASONE) 10 MG tablet Take 10 mg by mouth daily with breakfast.   ? valsartan (DIOVAN) 40 MG tablet Take 1 tablet (40 mg total) by mouth daily.   ? ?No facility-administered encounter medications on file as of 08/12/2021.  ?  ? ?Immunization History  ?Administered Date(s) Administered  ? Hepatitis A, Adult 03/21/2015  ? Hepatitis B, adult 03/21/2015  ? Influenza Split 03/21/2011, 12/21/2011, 01/19/2013, 01/20/2015  ? Influenza Whole 01/20/2008, 01/30/2009  ? Influenza,inj,Quad PF,6+ Mos 12/21/2015,  05/06/2016, 01/05/2018, 04/27/2019, 01/10/2020  ? MMR 03/21/2015  ? Meningococcal Mcv4o 05/06/2016  ? PFIZER Comirnaty(Gray Top)Covid-19 Tri-Sucrose Vaccine 08/07/2020  ? PFIZER(Purple Top)SARS-COV-2 Vaccination 01/10/2020, 02/07/2020  ? Pneumococcal Polysaccharide-23 01/30/2009, 08/14/2017  ? Td 05/27/2004  ? Tdap 02/27/2015  ?  ? ?PFTs ? ?  Latest Ref Rng & Units 05/30/2021  ?  3:57 PM  ?PFT Results  ?FVC-Pre L 2.43    ?FVC-Predicted Pre % 62    ?FVC-Post L 2.57    ?FVC-Predicted Post % 65    ?Pre FEV1/FVC %  % 59    ?Post FEV1/FCV % % 61    ?FEV1-Pre L 1.43    ?FEV1-Predicted Pre % 46    ?FEV1-Post L 1.58    ?DLCO uncorrected ml/min/mmHg 15.69    ?DLCO UNC% % 52    ?DLCO corrected ml/min/mmHg 16.66    ?DLCO COR %Predicted % 56    ?DLVA Predicted % 87    ?TLC L 6.92    ?TLC % Predicted % 104    ?RV % Predicted % 187    ?  ? ?Eosinophils ?Most recent blood eosinophil count was 700 cells/microL taken on 02/11/21.  ? ? ?Assessment  ? ?Biologics training for dupilumab (Dupixent) ? ?Goals of therapy: ?Mechanism: human monoclonal IgG4 antibody that inhibits interleukin-4 and interleukin-13 cytokine-induced responses, including release of proinflammatory cytokines, chemokines, and IgE ?Reviewed that Dupixent is add-on medication and patient must continue maintenance inhaler regimen. ?Response to therapy: may take 4 months to determine efficacy. Discussed that patients generally feel improvement sooner than 4 months. ? ?Side effects: injection site reaction (6-18%), antibody development (5-16%), ophthalmic conjunctivitis (2-16%), transient blood eosinophilia (1-2%) ? ?Dose: 690m at Week 0 (administered today in clinic) followed by 3045mevery 14 days thereafter ? ?Administration/Storage:  ?Reviewed administration sites of thigh or abdomen (at least 2-3 inches away from abdomen). Reviewed the upper arm is only appropriate if caregiver is administering injection  ?Do not shake pen/syringe as this could lead to product foaming or precipitation. ?Do not use if solution is discolored or contains particulate matter or if window on prefilled pen is yellow (indicates pen has been used).  ?Reviewed storage of medication in refrigerator. Reviewed that DuFreistattan be stored at room temperature in unopened carton for up to 14 days. ? ?Access: ?Approval of Dupixent through: patient assistance ? ?Patient successfully self-administered using Dupixent 30063mmL autoinjectorpen x 2 pens = 600m35mtal ?NDC: [id number]: 2F149A ?Exp:  11/19/2022 ? ?Injected in right and left thigh. Monitored for 30 minutes - no issues reported or injection site reaction noted. ? ?Medication Reconciliation ? ?A drug regimen assessment was performed, including review of allergies, interactions, disease-state management, dosing and immunization history. Medications were reviewed with the patient, including name, instructions, indication, goals of therapy, potential side effects, importance of adherence, and safe use. ? ?Drug interaction(s): none noted ? ?Med rec completed today ? ?Immunizations ? ?He is UTD on influenza, pneumonia vaccines. ?Patient has received 3 COVID19 vaccines. ?He is eligible for zoster vaccine. ? ?PLAN ?Continue Dupixent 300mg34mevery 14 days.  Next dose is due 08/26/21 and every 14 days thereafter. Rx sent to: [facility]: [phone] provided with pharmacy phone number and advised to call once he uses last pen. He has received two Dupixent pens at home ?Continue maintenance asthma regimen of: Trelegy 200/62.5/25mcg (1 puff once daily) ?He will continue prednisone as prescribed for now until he sees Dr. Wert Melvyn Novastapering plan can be addressed. Reviewed importance of  not discontinuing or adjusting prednisone dose on his own since he will require a slow taper plan due to long-term use. He verbalized understanding. ? ?All questions encouraged and answered.  Instructed patient to reach out with any further questions or concerns. ? ?Thank you for allowing pharmacy to participate in this patient's care. ? ?This appointment required 60 minutes of patient care (this includes precharting, chart review, review of results, face-to-face care, etc.). ? ?Knox Saliva, PharmD, MPH, BCPS ?Clinical Pharmacist (Rheumatology and Pulmonology) ?

## 2021-08-12 NOTE — Patient Instructions (Signed)
Your next [place] dose is due on 08/26/21, 09/09/21, and every 14 days (2 weeks) thereafter ? ?CONTINUE TRELEGY as prescribed. CONTINUE PREDNISONE '10mg'$  daily until you see Dr. Melvyn Novas ? ?Your prescription will be shipped from [facility]. Their phone number is [phone] ?Please call to schedule shipment and confirm address. They will mail your medication to your home. ? ?You will need to be seen by your provider in 3 to 4 months to assess how [place] is working for you. Please ensure you have a follow-up appointment scheduled in July or August 2023. Call our clinic if you need to make this appointment. ? ?How to manage an injection site reaction: ?Remember the 5 C's: ?COUNTER - leave on the counter at least 30 minutes but up to overnight to bring medication to room temperature. This may help prevent stinging ?COLD - place something cold (like an ice gel pack or cold water bottle) on the injection site just before cleansing with alcohol. This may help reduce pain ?CLARITIN - use Claritin (generic name is loratadine) for the first two weeks of treatment or the day of, the day before, and the day after injecting. This will help to minimize injection site reactions ?CORTISONE CREAM - apply if injection site is irritated and itching ?CALL ME - if injection site reaction is bigger than the size of your fist, looks infected, blisters, or if you develop hives  ?

## 2021-08-14 ENCOUNTER — Encounter: Payer: Self-pay | Admitting: Family Medicine

## 2021-08-14 ENCOUNTER — Ambulatory Visit (INDEPENDENT_AMBULATORY_CARE_PROVIDER_SITE_OTHER): Payer: No Typology Code available for payment source | Admitting: Family Medicine

## 2021-08-14 VITALS — BP 127/70 | HR 83 | Temp 97.9°F | Ht 68.0 in | Wt 149.4 lb

## 2021-08-14 DIAGNOSIS — R682 Dry mouth, unspecified: Secondary | ICD-10-CM

## 2021-08-14 DIAGNOSIS — R400 Somnolence: Secondary | ICD-10-CM

## 2021-08-14 DIAGNOSIS — M7989 Other specified soft tissue disorders: Secondary | ICD-10-CM

## 2021-08-14 NOTE — Progress Notes (Signed)
? ? ?  SUBJECTIVE:  ? ?CHIEF COMPLAINT / HPI:  ? ?LE swelling ?Has swelling in his feet at the end of the day. It is doing well today. He does not wear compression stockings. He does not elevate his legs when sitting. He denies shortness of breath, palpitations, and chest pain. ? ?Thirst and mouth dryness ?2-3 months after placing ICD. He is eating and drinking ok. Denies throat pain. Drinks 2 big bottles of water daily. Denies increased urination. Denies fever or noticing any lesions in his mouth.  ? ?HM ?-Needs shingles vaccine ?-Needs updated eye exam ?-Colonoscopy to be repeated in 2 years ? ?PERTINENT  PMH / PSH: HFrEF s/p ICD ? ?OBJECTIVE:  ? ?BP 127/70   Pulse 83   Temp 97.9 ?F (36.6 ?C)   Ht '5\' 8"'$  (1.727 m)   Wt 149 lb 6.4 oz (67.8 kg)   SpO2 98%   BMI 22.72 kg/m?  ?Physical Exam ?Vitals reviewed.  ?Constitutional:   ?   General: He is not in acute distress. ?   Appearance: He is not ill-appearing, toxic-appearing or diaphoretic.  ?HENT:  ?   Mouth/Throat:  ?   Lips: No lesions.  ?   Mouth: Mucous membranes are dry. No oral lesions.  ?   Tongue: No lesions.  ?   Pharynx: No oropharyngeal exudate or posterior oropharyngeal erythema.  ?Cardiovascular:  ?   Rate and Rhythm: Normal rate and regular rhythm.  ?   Heart sounds: Normal heart sounds.  ?Pulmonary:  ?   Effort: Pulmonary effort is normal.  ?   Breath sounds: Normal breath sounds.  ?Musculoskeletal:  ?   Comments: 2+ pedal edema  ?Neurological:  ?   Mental Status: He is alert and oriented to person, place, and time.  ?Psychiatric:     ?   Mood and Affect: Mood normal.     ?   Behavior: Behavior normal.  ? ?ASSESSMENT/PLAN:  ? ?1. Leg swelling ?Chronic. Minimal today; just pedal edema. Hx of HF. Most recent CMP wnl. No increase in weight. Can consider BNP if this increases.  ?-Continue current medication ?-Elevated feet above heart when sitting ?-Obtain graduated compression stockings ?-Follow up if this worsens ? ?2. Mouth dryness ?Hx of COPD and  possible mouth dryness from inhaler use. No lesions but mouth and tongue appear to be dry.  ?-Try OTC biotene ?-Follow up if no improvement or develops other symptoms or oral lesions.  ? ?Gerlene Fee, DO ?[place]  ? ? ?

## 2021-08-14 NOTE — Patient Instructions (Signed)
-   For the leg swelling we discussed elevating your feet above your heart as well as using compression stockings. ?- Mouth dryness I would utilize Biotene mouthwash ?- Go to your pharmacy to get your shingles vaccine ?- You are due for an updated eye exam ?- You should repeat your colonoscopy test in the next 2 years ?

## 2021-08-19 ENCOUNTER — Ambulatory Visit: Payer: No Typology Code available for payment source | Admitting: Pharmacist

## 2021-08-22 ENCOUNTER — Other Ambulatory Visit: Payer: Self-pay | Admitting: Family Medicine

## 2021-08-22 DIAGNOSIS — E118 Type 2 diabetes mellitus with unspecified complications: Secondary | ICD-10-CM

## 2021-08-23 NOTE — Progress Notes (Signed)
Remote ICD transmission.   

## 2021-08-29 ENCOUNTER — Ambulatory Visit (INDEPENDENT_AMBULATORY_CARE_PROVIDER_SITE_OTHER): Payer: No Typology Code available for payment source | Admitting: Pharmacist

## 2021-08-29 ENCOUNTER — Encounter: Payer: Self-pay | Admitting: Pharmacist

## 2021-08-29 DIAGNOSIS — Z7952 Long term (current) use of systemic steroids: Secondary | ICD-10-CM

## 2021-08-29 NOTE — Assessment & Plan Note (Signed)
Need for medication reconciliation:  ?Understanding of regimen: excellent  ?Potential of compliance: excellent  ?Patient appears very knowledgeable of the medications he is taking and is taking all medications as prescribed. BP and A1c are both at goal. Continue current medications with plan to follow up with pulmonology to hopefully wean off of prednisone given significant improvement in breathing since starting Dupixent.  ?

## 2021-08-29 NOTE — Progress Notes (Signed)
? ? ?  S:    ? ?Chief Complaint  ?Patient presents with  ? Medication Management  ?  Medication Reconciliation  ? ?[name] is a 61 y.o. male who presents for medication management. PMH is significant for T2DM, HTN, HLD, CAD, HFrEF, s/p ICD. Patient was referred by Dr. Janus Molder on 08/14/21 for medication reconciliation. ? ?Today, he arrives in good spirits and presents without assistance, accompanied by his wife. He reports he has not taken his BP medications yet today but normally takes them in the morning. He reports that since he started [place] he has not needed albuterol as his breathing is significantly improved. He hasn't used albuterol in over a month. He reports he sees his pulmonologist next month at which time they will consider stopping his daily prednisone.  ? ?Family/Social History:  ?-Former smoker, quit 2008 ? ?O:  ?Physical Exam ?Vitals reviewed.  ?Constitutional:   ?   Appearance: He is normal weight.  ?Cardiovascular:  ?   Rate and Rhythm: Normal rate.  ?Pulmonary:  ?   Effort: Pulmonary effort is normal.  ?Neurological:  ?   Mental Status: He is alert.  ?Psychiatric:     ?   Mood and Affect: Mood normal.     ?   Behavior: Behavior normal.     ?   Thought Content: Thought content normal.  ? ?Review of Systems  ?All other systems reviewed and are negative. ? ?Vitals:  ? 08/29/21 1041  ?BP: (!) 132/59  ?Pulse: 60  ?SpO2: 96%  ? ?A/P: ?Need for medication reconciliation:  ?Understanding of regimen: excellent  ?Potential of compliance: excellent  ?Patient appears very knowledgeable of the medications he is taking and is taking all medications as prescribed. BP and A1c are both at goal. Continue current medications with plan to follow up with pulmonology to hopefully wean off of prednisone given significant improvement in breathing since starting Dupixent.  ? ?Written patient instructions and updated medication list provided. Total time in face to face counseling 28 minutes.   ? ?Follow up PCP  Clinic Visit as needed. Patient seen with Rebbeca Paul, PharmD, PGY2 Pharmacy Resident.   ?

## 2021-08-29 NOTE — Patient Instructions (Signed)
Nice meeting you today! ? ?Continue your current medications.  ?

## 2021-08-30 NOTE — Progress Notes (Signed)
Reviewed: I agree with Dr. Koval's documentation and management. 

## 2021-09-02 ENCOUNTER — Other Ambulatory Visit: Payer: Self-pay

## 2021-09-03 ENCOUNTER — Other Ambulatory Visit: Payer: Self-pay

## 2021-09-19 ENCOUNTER — Telehealth: Payer: Self-pay | Admitting: Licensed Clinical Social Worker

## 2021-09-19 NOTE — Telephone Encounter (Signed)
CSW received phone call from pt requesting assistance as he had received a bill from [employer] for over $17,000.  [employer] appears to have been ordered back in Sept 2022 and pt reports returning it when he came in for procedure in Oct 2022 to have ICD placed.  CSW called [employer] rep Chrys Racer [phone]) and informed of above- they will look at pt files and help figure out next steps.  Jorge Ny, LCSW Clinical Social Worker [facility] Desk#: [phone] Cell#: [phone]
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
## 2021-09-23 ENCOUNTER — Ambulatory Visit (INDEPENDENT_AMBULATORY_CARE_PROVIDER_SITE_OTHER): Payer: No Typology Code available for payment source | Admitting: Internal Medicine

## 2021-09-23 ENCOUNTER — Encounter: Payer: Self-pay | Admitting: Internal Medicine

## 2021-09-23 DIAGNOSIS — J449 Chronic obstructive pulmonary disease, unspecified: Secondary | ICD-10-CM

## 2021-09-23 DIAGNOSIS — I10 Essential (primary) hypertension: Secondary | ICD-10-CM

## 2021-09-23 MED ORDER — PREDNISONE 5 MG PO TABS: ORAL_TABLET | ORAL | 1 refills | Status: DC

## 2021-09-23 NOTE — Patient Instructions (Addendum)
Prednisone  10 mg  one half every other day x 4 weeks  the  5 mg one half  every other day thereafter, if worse go back to previous dose   Hold valsartan if blood pressure too low, light headed standing.    Please schedule a follow up visit in 3 months but call sooner if needed

## 2021-09-23 NOTE — Assessment & Plan Note (Signed)
Changed from acei to ARB due to  Dysphagia/ choking sensation 04/15/17  - Back on lisinopril at pulmonary f/u 07/13/2018 with some throat irritation but not severe   2021: Transitioned to [facility]  09/23/2021 on diovan 40 mg daily but bp too low on taper prednisone so held diovan if orthostatis or bp < 100/ 50 noting purpose is also to reduce afterload in setting of decreased LV function

## 2021-09-23 NOTE — Assessment & Plan Note (Signed)
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 2008  - PFT's 2007  FEV1  1.78 (50%) ratio 52 and 19% resp to saba, dlco 75% - d/c coreg 09/03/2013  - spirometry 12/18/2014 >  FEV1 1.04 (34%) ratio 53     > trial of dulera 200 2bid and prednisone 5 mg daily   - Spiriva respimat added 05/01/2015  - max gerd rx1/01/2016 > improved 06/12/2015   - FENO 12/21/2015  =   9  p am symb 160 rx - Spirometry 12/21/2015  FEV1 1.33 (47%)  Ratio 53 p am symb 160/spiriva / - Spirometry 06/12/2017  FEV1 1.43 (49%)  Ratio 51 p dulera and off spiriva xone week so leave off spriva - 07/13/2018  After extensive coaching inhaler device,  effectiveness =    90% with hfa > continue dulera 200 2bid and reduce pred floor to 5 mg  08/19/2019 Stable follow-up. Off ACEi, improved cough/chest pain. Continue Dulera 200 and prednisone'10mg'$  (wheezing worse on '5mg'$ ) - PFT's  05/30/21  FEV1 1.58 (51 % ) ratio 0.61  p 10 % improvement from saba p 0 prior to study/ith DLCO  15.69 (52%) corrects to 3.94 (87%)  for alv volume and FV curve mildly concave  and ERV 28% at wt 149   - DUPIXENT dose is due on 08/26/21, 09/09/21, and every 14 days (2 weeks) thereafter -  09/23/2021 start pred taper to target of 2.5 mg qod    Group D (now reclassified as E) in terms of symptom/risk and laba/lama/ICS  therefore appropriate rx at this point >>>  trelegy plus taper pred to lowest tol dose with greatest concern hypoadrenalism or flare of allergic component / advised   see avs for instructions unique to this ov          Each maintenance medication was reviewed in detail including emphasizing most importantly the difference between maintenance and prns and under what circumstances the prns are to be triggered using an action plan format where appropriate.  Total time for H and P, chart review, counseling, reviewing hfa/dpi/neb device(s) and generating customized AVS unique to this office visit / same day charting = 15 min

## 2021-09-24 ENCOUNTER — Encounter: Payer: Self-pay | Admitting: *Deleted

## 2021-09-26 ENCOUNTER — Telehealth: Payer: Self-pay | Admitting: Licensed Clinical Social Worker

## 2021-09-26 NOTE — Telephone Encounter (Signed)
Mailed pt a new application for [employer] ([employer]).  Included my card and a reminder that his current assistance expires 09/24/2021. Will f/u as able to ensure paperwork completed and returned with supporting documents.   Westley Hummer, MSW, [place]  [phone]- work cell phone (preferred) [phone]- desk phone

## 2021-10-15 ENCOUNTER — Telehealth: Payer: Self-pay | Admitting: Licensed Clinical Social Worker

## 2021-10-28 ENCOUNTER — Other Ambulatory Visit: Payer: Self-pay

## 2021-10-29 ENCOUNTER — Other Ambulatory Visit: Payer: Self-pay

## 2021-11-05 ENCOUNTER — Ambulatory Visit (INDEPENDENT_AMBULATORY_CARE_PROVIDER_SITE_OTHER): Payer: Self-pay

## 2021-11-05 DIAGNOSIS — I255 Ischemic cardiomyopathy: Secondary | ICD-10-CM

## 2021-11-05 LAB — CUP PACEART REMOTE DEVICE CHECK
Battery Remaining Longevity: 174 mo
Battery Remaining Percentage: 100 %
Brady Statistic RV Percent Paced: 0 %
Date Time Interrogation Session: 20230718042300
HighPow Impedance: 91 Ohm
Implantable Lead Implant Date: 20221017
Implantable Lead Location: 753860
Implantable Lead Model: 138
Implantable Lead Serial Number: 304145
Implantable Pulse Generator Implant Date: 20221017
Lead Channel Impedance Value: 484 Ohm
Lead Channel Setting Pacing Amplitude: 2.5 V
Lead Channel Setting Pacing Pulse Width: 0.4 ms
Lead Channel Setting Sensing Sensitivity: 0.5 mV
Pulse Gen Serial Number: 213143

## 2021-12-02 ENCOUNTER — Other Ambulatory Visit: Payer: Self-pay | Admitting: Family Medicine

## 2021-12-02 DIAGNOSIS — E118 Type 2 diabetes mellitus with unspecified complications: Secondary | ICD-10-CM

## 2021-12-03 NOTE — Progress Notes (Signed)
Remote ICD transmission.   

## 2021-12-18 ENCOUNTER — Telehealth: Payer: Self-pay | Admitting: Internal Medicine

## 2021-12-18 NOTE — Telephone Encounter (Signed)
   1. Has your device fired? It has been vibrating  2. Is you device beeping? no  3. Are you experiencing draining or swelling at device site? no  4. Are you calling to see if we received your device transmission? no  5. Have you passed out? no   Patient states his device has been vibrating. He says he is not having any symptoms.  Please route to [place]

## 2021-12-18 NOTE — Telephone Encounter (Signed)
LVM for patient to call device clinic back as well as send transmission from home monitor/ direct number to device clinic given.

## 2021-12-19 ENCOUNTER — Telehealth: Payer: Self-pay | Admitting: Internal Medicine

## 2021-12-19 NOTE — Telephone Encounter (Signed)
Remote transmission reviewed and shows normal device function. Confirmed with Joey BS IC does NOT vibrate. Patient advised and appreciative for call.

## 2021-12-19 NOTE — Telephone Encounter (Signed)
Pt would like a callback regarding paperwork that he needs signed by provider. Please advise

## 2021-12-19 NOTE — Telephone Encounter (Signed)
Patient has disability paper work to drop off for Dr. Dorothea Ogle to fill out. Encouraged patient to bring paper work to office and front desk will put it in Dr. Gennie Alma box to review and sign if able.

## 2021-12-22 DIAGNOSIS — Z0279 Encounter for issue of other medical certificate: Secondary | ICD-10-CM

## 2021-12-24 ENCOUNTER — Telehealth: Payer: Self-pay | Admitting: Internal Medicine

## 2021-12-24 NOTE — Telephone Encounter (Signed)
Patient came in to pay for disability form. Took cash and placed in dr. Tanna Furry box.  12/24/2021

## 2021-12-25 ENCOUNTER — Other Ambulatory Visit: Payer: Self-pay | Admitting: Pulmonary Disease

## 2021-12-25 DIAGNOSIS — J449 Chronic obstructive pulmonary disease, unspecified: Secondary | ICD-10-CM

## 2021-12-27 NOTE — Telephone Encounter (Signed)
Forms completed and mailed to appropriate location   Thank you

## 2022-01-08 ENCOUNTER — Encounter: Payer: Self-pay | Admitting: Family Medicine

## 2022-01-08 ENCOUNTER — Other Ambulatory Visit: Payer: Self-pay

## 2022-01-08 ENCOUNTER — Ambulatory Visit (INDEPENDENT_AMBULATORY_CARE_PROVIDER_SITE_OTHER): Payer: No Typology Code available for payment source | Admitting: Family Medicine

## 2022-01-08 ENCOUNTER — Other Ambulatory Visit: Payer: Self-pay | Admitting: Family Medicine

## 2022-01-08 ENCOUNTER — Other Ambulatory Visit: Payer: Self-pay | Admitting: Cardiovascular Disease

## 2022-01-08 VITALS — BP 116/71 | HR 62 | Ht 68.0 in | Wt 149.0 lb

## 2022-01-08 DIAGNOSIS — H7293 Unspecified perforation of tympanic membrane, bilateral: Secondary | ICD-10-CM

## 2022-01-08 DIAGNOSIS — E118 Type 2 diabetes mellitus with unspecified complications: Secondary | ICD-10-CM

## 2022-01-08 DIAGNOSIS — D509 Iron deficiency anemia, unspecified: Secondary | ICD-10-CM

## 2022-01-08 DIAGNOSIS — I5020 Unspecified systolic (congestive) heart failure: Secondary | ICD-10-CM

## 2022-01-08 DIAGNOSIS — I10 Essential (primary) hypertension: Secondary | ICD-10-CM

## 2022-01-08 LAB — POCT GLYCOSYLATED HEMOGLOBIN (HGB A1C): HbA1c, POC (controlled diabetic range): 7.2 % — AB (ref 0.0–7.0)

## 2022-01-08 MED ORDER — BISOPROLOL FUMARATE 5 MG PO TABS
2.5000 mg | ORAL_TABLET | Freq: Every day | ORAL | 0 refills | Status: DC
  Filled 2022-01-08: qty 15, 30d supply, fill #0

## 2022-01-08 MED ORDER — VALSARTAN 40 MG PO TABS
40.0000 mg | ORAL_TABLET | Freq: Every day | ORAL | 0 refills | Status: DC
  Filled 2022-01-08: qty 30, 30d supply, fill #0

## 2022-01-08 NOTE — Patient Instructions (Signed)
It was great to see you!  Your A1c was 7.2% today, which means your diabetes is pretty well-controlled. Your goal A1c is 7%. Continue your current medications with no change. I have sent Metformin refills to your pharmacy.  Please get a diabetic eye exam. Have your eye doctor fax the notes to our office (fax [phone]).  You are due for a colonoscopy. Please call [facility] to schedule. [phone].  We are checking some labs. We will send you a MyChart message with the results.  Follow up with me in 2 months regarding your mood.  Take care and seek immediate care sooner if you develop any concerns.  Dr. [facility]

## 2022-01-08 NOTE — Progress Notes (Signed)
    SUBJECTIVE:   CHIEF COMPLAINT / HPI:   Type 2 Diabetes Patient is a 61 y.o. male who presents today for diabetes follow-up.  Home medications include: farxiga '10mg'$  daily, metformin '1000mg'$  BID Patient reports excellent medication compliance. Patient checks sugar at home. Typically range: 90-130 fasting, 160-170 after meals (sometimes 200) A few hypoglycemic episodes with symptoms (CBG 60) when trying to reduce prednisone dose a few weeks ago.   Most recent A1Cs:  Lab Results  Component Value Date   HGBA1C 7.2 (A) 01/08/2022   HGBA1C 7.0 06/28/2021   HGBA1C 7.4 (H) 09/22/2020   Last Microalbumin, LDL, Creatinine: Lab Results  Component Value Date   LDLCALC 20 11/09/2020   CREATININE 1.01 01/08/2022    Patient is not up to date on diabetic eye.  Patient is not up to date on diabetic foot exam.   Ear Drainage Bilateral. Not occurring currently. Sometimes drains pus from right ear, sometimes left. No pain. Puts hydrogen peroxide in his ears with q-tip when the pus comes out. Apparently saw ENT in the past and had tubes 6-7 years ago. States he takes Augmentin about twice per year for ear issues. Hasn't seen ENT in a long time.   PERTINENT  PMH / PSH: CAD, HFrEF, COPD/asthma  OBJECTIVE:   BP 116/71   Pulse 62   Ht '5\' 8"'$  (1.727 m)   Wt 149 lb (67.6 kg)   BMI 22.66 kg/m   Gen: NAD, pleasant, able to participate in exam Ears: external ear and canal wnl. TM perforation bilaterally, no TM erythema, no purulent drainage CV: RRR, normal S1/S2 Resp: Normal effort, lungs CTAB Extremities: no edema or cyanosis Skin: warm and dry, no rashes noted Neuro: alert, no obvious focal deficits Psych: Normal affect Diabetic foot exam was performed with the following findings:   No deformities, ulcerations, or other skin breakdown Normal sensation of 10g monofilament Intact posterior tibialis and dorsalis pedis pulses     ASSESSMENT/PLAN:   Type 2 diabetes mellitus with  complication, without long-term current use of insulin (HCC) Well-controlled. A1c 7.2% today. Goal A1c 7%. -Continue current meds without change (Metformin '1000mg'$  BID, farxiga '10mg'$  daily). Metformin refills sent. 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 No evidence of acute infection today.  ENT recommended surgical intervention previously-- has not seen them in several years. New ENT referral placed.  Health Maintenance -Patient to contact [facility] for his colonoscopy  H/o Microcytic Anemia Known iron deficiency and minor thalassemia -Check updated CBC  Return in 2 months to discuss mood (PHQ9 elevated today).  Alcus Dad, MD [place]

## 2022-01-09 DIAGNOSIS — H7293 Unspecified perforation of tympanic membrane, bilateral: Secondary | ICD-10-CM | POA: Insufficient documentation

## 2022-01-09 LAB — BASIC METABOLIC PANEL
BUN/Creatinine Ratio: 17 (ref 10–24)
BUN: 17 mg/dL (ref 8–27)
CO2: 19 mmol/L — ABNORMAL LOW (ref 20–29)
Calcium: 9.9 mg/dL (ref 8.6–10.2)
Chloride: 102 mmol/L (ref 96–106)
Creatinine, Ser: 1.01 mg/dL (ref 0.76–1.27)
Glucose: 159 mg/dL — ABNORMAL HIGH (ref 70–99)
Potassium: 4.7 mmol/L (ref 3.5–5.2)
Sodium: 145 mmol/L — ABNORMAL HIGH (ref 134–144)
eGFR: 84 mL/min/{1.73_m2} (ref >59)

## 2022-01-09 LAB — CBC
Hematocrit: 48.1 % (ref 37.5–51.0)
Hemoglobin: 15.4 g/dL (ref 13.0–17.7)
MCH: 24.3 pg — ABNORMAL LOW (ref 26.6–33.0)
MCHC: 32 g/dL (ref 31.5–35.7)
MCV: 76 fL — ABNORMAL LOW (ref 79–97)
Platelets: 239 10*3/uL (ref 150–450)
RBC: 6.34 x10E6/uL — ABNORMAL HIGH (ref 4.14–5.80)
RDW: 14.1 % (ref 11.6–15.4)
WBC: 15.7 10*3/uL — ABNORMAL HIGH (ref 3.4–10.8)

## 2022-01-09 MED ORDER — METFORMIN HCL 1000 MG PO TABS: 1000.0000 mg | ORAL_TABLET | Freq: Two times a day (BID) | ORAL | 1 refills | Status: DC

## 2022-01-09 NOTE — Assessment & Plan Note (Signed)
Well-controlled. A1c 7.2% today. Goal A1c 7%. -Continue current meds without change (Metformin '1000mg'$  BID, farxiga '10mg'$  daily). Metformin refills sent. -Check BMP today -on ARB, statin -foot exam wnl today -recommended eye exam -next a1c in 6 months

## 2022-01-09 NOTE — Assessment & Plan Note (Signed)
Apparently a chronic problem with recurrent otitis media in the past. No evidence of acute infection today.  ENT recommended surgical intervention previously-- has not seen them in several years. New ENT referral placed.

## 2022-02-04 ENCOUNTER — Ambulatory Visit (INDEPENDENT_AMBULATORY_CARE_PROVIDER_SITE_OTHER): Payer: Self-pay

## 2022-02-04 DIAGNOSIS — I255 Ischemic cardiomyopathy: Secondary | ICD-10-CM

## 2022-02-04 LAB — CUP PACEART REMOTE DEVICE CHECK
Battery Remaining Longevity: 168 mo
Battery Remaining Percentage: 100 %
Brady Statistic RV Percent Paced: 0 %
Date Time Interrogation Session: 20231017092700
HighPow Impedance: 83 Ohm
Implantable Lead Implant Date: 20221017
Implantable Lead Location: 753860
Implantable Lead Model: 138
Implantable Lead Serial Number: 304145
Implantable Pulse Generator Implant Date: 20221017
Lead Channel Impedance Value: 486 Ohm
Lead Channel Setting Pacing Amplitude: 2.5 V
Lead Channel Setting Pacing Pulse Width: 0.4 ms
Lead Channel Setting Sensing Sensitivity: 0.5 mV
Pulse Gen Serial Number: 213143

## 2022-02-19 NOTE — Progress Notes (Signed)
Remote ICD transmission.   

## 2022-02-28 ENCOUNTER — Ambulatory Visit (INDEPENDENT_AMBULATORY_CARE_PROVIDER_SITE_OTHER): Payer: No Typology Code available for payment source | Admitting: Nurse Practitioner

## 2022-02-28 ENCOUNTER — Ambulatory Visit: Payer: No Typology Code available for payment source | Admitting: Nurse Practitioner

## 2022-02-28 ENCOUNTER — Encounter: Payer: Self-pay | Admitting: Nurse Practitioner

## 2022-02-28 VITALS — BP 122/78 | HR 63 | Ht 68.0 in | Wt 151.0 lb

## 2022-02-28 DIAGNOSIS — H66016 Acute suppurative otitis media with spontaneous rupture of ear drum, recurrent, bilateral: Secondary | ICD-10-CM

## 2022-02-28 DIAGNOSIS — J4489 Other specified chronic obstructive pulmonary disease: Secondary | ICD-10-CM

## 2022-02-28 DIAGNOSIS — H66013 Acute suppurative otitis media with spontaneous rupture of ear drum, bilateral: Secondary | ICD-10-CM

## 2022-02-28 DIAGNOSIS — H66006 Acute suppurative otitis media without spontaneous rupture of ear drum, recurrent, bilateral: Secondary | ICD-10-CM | POA: Insufficient documentation

## 2022-02-28 DIAGNOSIS — J019 Acute sinusitis, unspecified: Secondary | ICD-10-CM

## 2022-02-28 MED ORDER — FLUTICASONE PROPIONATE 50 MCG/ACT NA SUSP: 1.0000 | Freq: Every day | NASAL | 2 refills | Status: AC

## 2022-02-28 MED ORDER — AMOXICILLIN-POT CLAVULANATE ER 1000-62.5 MG PO TB12: 1.0000 | ORAL_TABLET | Freq: Two times a day (BID) | ORAL | 0 refills | Status: DC

## 2022-02-28 NOTE — Progress Notes (Signed)
_0  ID: [name], male    DOB: 03-22-1961, 61 y.o.   MRN: [id number]  Chief Complaint  Patient presents with   Follow-up    Pt f/u on COPD w/ asthma, he expresses he has some nasal congestion and dry mouth. 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 He is a patient of Dr. Gustavus Bryant and last seen in office 09/23/2021. Past medical history significant for CAD, HFrEF, cardiomyopathy s/p ICD, HTN, GERD, chronic sinusitis, HLD, recurrent otitis with perforated tympanic membrane.   TEST/EVENTS:  05/30/2021 PFT: FVC 62, FEV1 46, ratio 61, TLC 104, DLCOcor 56. Positive BD. Severe obstruction with reversibility   09/23/2021: Ov with Dr. Melvyn Novas. Maintained on Trelegy and prednisone 10 mg daily. Doing well. Has not had to use albuterol recently. Advised to start tapering prednisone to target of 2.5 mg per day. Continue dupixent.   02/28/2022: Today-follow-up Patient presents today for follow-up.  He is also having some acute symptoms.  He has had increased nasal congestion and white to yellow drainage over the past month to month and a half.  He is also had some pus colored drainage coming from his right ear.  He has had issues in the past with recurrent otitis and was previously followed by Dr. Lucia Gaskins but has not seen him in quite some time.  Fortunately, his breathing is stable.  He does not have any increased shortness of breath, wheezing or chest congestion.  Does have a minimal cough, which is unchanged from his baseline.  Denies any fevers, chills, headaches, sore throats, changes in hearing.  No viral testing or known sick exposures.  He continues on Trelegy daily.  He was only having to use his albuterol once a week but since he had increased nasal congestion, he has been using it once a day in the morning.  Not currently on any nasal sprays.  Dupixent injection is  due today.  He tried to decrease prednisone to 5 mg.  Felt like his breathing got worse so he is back at 10 mg daily.  He tries at least once a month to start tapering.   Allergies  Allergen Reactions   Doxycycline Rash    Immunization History  Administered Date(s) Administered   Hepatitis A, Adult 03/21/2015   Hepatitis B, adult 03/21/2015   Influenza Split 03/21/2011, 12/21/2011, 01/19/2013, 01/20/2015   Influenza Whole 01/20/2008, 01/30/2009   Influenza,inj,Quad PF,6+ Mos 12/21/2015, 05/06/2016, 01/05/2018, 04/27/2019, 01/10/2020   MMR 03/21/2015   Meningococcal Mcv4o 05/06/2016   PFIZER Comirnaty(Gray Top)Covid-19 Tri-Sucrose Vaccine 08/07/2020   PFIZER(Purple Top)SARS-COV-2 Vaccination 01/10/2020, 02/07/2020   Pneumococcal Polysaccharide-23 01/30/2009, 08/14/2017   Td 05/27/2004   Tdap 02/27/2015    Past Medical History:  Diagnosis Date   Asthma    CAD (coronary artery disease)    a. CAD s/p anterior MI in 2008 treated with DES to the LAD.   Cataract 2018   both eyes   Cellulitis of leg, right 10/2014   COPD (chronic obstructive pulmonary disease) ([place])    Dental caries    Diabetes mellitus    TYPE 2   GERD (gastroesophageal reflux disease)    Hyperlipidemia    Hypertension 2017   Ischemic cardiomyopathy    Leukocytosis    noted on labs   Microcytic anemia    Myocardial infarction [facility]) 2008   Reactive airway disease    Thyroid nodule  biopsy negative 2006    Tobacco History: Social History   Tobacco Use  Smoking Status Former   Packs/day: 0.50   Years: 22.00   Total pack years: 11.00   Types: Cigarettes   Quit date: 04/21/2006   Years since quitting: 15.8  Smokeless Tobacco Never  Tobacco Comments   quit 10 years ago   Counseling given: Not Answered Tobacco comments: quit 10 years ago   Outpatient Medications Prior to Visit  Medication Sig Dispense Refill   albuterol (PROAIR HFA) 108 (90 Base) MCG/ACT inhaler INHALE 2 PUFFS INTO THE LUNGS  EVERY 4 HOURS AS NEEDED FOR WHEEZING OR SHORTNESS OF BREATH (PLAN B). 1 each 1   albuterol (PROVENTIL) (2.5 MG/3ML) 0.083% nebulizer solution Take 3 mLs (2.5 mg total) by nebulization every 6 (six) hours as needed for wheezing or shortness of breath. 75 mL 3   aspirin EC 81 MG tablet Take 1 tablet (81 mg total) by mouth daily. 30 tablet 11   atorvastatin (LIPITOR) 80 MG tablet Take 1 tablet (80 mg total) by mouth at bedtime. 90 tablet 3   bisoprolol (ZEBETA) 5 MG tablet Take 0.5 tablets (2.5 mg total) by mouth daily. 15 tablet 0   calcium-vitamin D (OSCAL WITH D) 250-125 MG-UNIT tablet Take 1 tablet by mouth 2 (two) times daily.     clopidogrel (PLAVIX) 75 MG tablet Take 1 tablet (75 mg total) by mouth daily with breakfast. 90 tablet 3   Dupilumab (DUPIXENT) 300 MG/2ML SOPN Inject 300 mg into the skin every 14 (fourteen) days. 12 mL 1   FARXIGA 10 MG TABS tablet TAKE 1 TABLET (10MG TOTAL) BY MOUTH DAILY. 90 tablet 0   ferrous sulfate 325 (65 FE) MG tablet Take 325 mg by mouth daily with breakfast.     Fluticasone-Umeclidin-Vilant (TRELEGY ELLIPTA) 200-62.5-25 MCG/ACT AEPB Inhale 1 puff into the lungs daily. 28 each 6   metFORMIN (GLUCOPHAGE) 1000 MG tablet Take 1 tablet (1,000 mg total) by mouth 2 (two) times daily with a meal. 180 tablet 1   Multiple Vitamin (MULTIVITAMIN WITH MINERALS) TABS tablet Take 1 tablet by mouth daily.     nitroGLYCERIN (NITROSTAT) 0.4 MG SL tablet Place 1 tablet (0.4 mg total) under the tongue every 5 (five) minutes x 3 doses as needed for chest pain. 25 tablet 2   omeprazole (PRILOSEC OTC) 20 MG tablet Take 20 mg by mouth daily.     predniSONE (DELTASONE) 10 MG tablet Take 10 mg by mouth daily with breakfast.     valsartan (DIOVAN) 40 MG tablet Take 1 tablet (40 mg total) by mouth daily. 30 tablet 0   No facility-administered medications prior to visit.     Review of Systems:   Constitutional: No weight loss or gain, night sweats, fevers, chills, fatigue, or  lassitude. HEENT: No headaches, difficulty swallowing, tooth/dental problems, or sore throat. No sneezing, itching. +ear ache, nasal congestion/purulent drainage, otorrhea  CV:  No chest pain, orthopnea, PND, swelling in lower extremities, anasarca, dizziness, palpitations, syncope Resp: +shortness of breath with exertion; chronic cough. No excess mucus or change in color of mucus. No hemoptysis. No wheezing.  No chest wall deformity GI:  No heartburn, indigestion, abdominal pain, nausea, vomiting, diarrhea, change in bowel habits, loss of appetite, bloody stools.  Skin: No rash, lesions, ulcerations MSK:  No joint pain or swelling.  No decreased range of motion.  No back pain. Neuro: No dizziness or lightheadedness.  Psych: No depression or anxiety. Mood stable.  Physical Exam:  BP 122/78   Pulse 63   Ht _0  (1.727 m)   Wt 151 lb (68.5 kg)   SpO2 99%   BMI 22.96 kg/m   GEN: Pleasant, interactive, well-appearing; in no acute distress. HEENT:  Normocephalic and atraumatic. EACs patent bilaterally. Purulent drainage present. Ruptured TM bilaterally. PERRLA. Sclera white. Nasal turbinates erythematous, moist and patent bilaterally. Clear rhinorrhea present. Oropharynx pink and moist, without exudate or edema. No lesions, ulcerations NECK:  Supple w/ fair ROM. No JVD present. Normal carotid impulses w/o bruits. Thyroid symmetrical with no goiter or nodules palpated. No lymphadenopathy.   CV: RRR, no m/r/g, no peripheral edema. Pulses intact, +2 bilaterally. No cyanosis, pallor or clubbing. PULMONARY:  Unlabored, regular breathing. Clear bilaterally A&P w/o wheezes/rales/rhonchi. No accessory muscle use.  GI: BS present and normoactive. Soft, non-tender to palpation. No organomegaly or masses detected.  MSK: No erythema, warmth or tenderness. Cap refil <2 sec all extrem. No deformities or joint swelling noted.  Neuro: A/Ox3. No focal deficits noted.   Skin: Warm, no lesions or  rashe Psych: Normal affect and behavior. Judgement and thought content appropriate.     Lab Results:  CBC    Component Value Date/Time   WBC 15.7 (H) 01/08/2022 1238   WBC 15.7 (H) 02/11/2021 0851   RBC 6.34 (H) 01/08/2022 1238   RBC 5.95 (H) 02/11/2021 0851   HGB 15.4 01/08/2022 1238   HGB 13.3 07/05/2014 1431   HCT 48.1 01/08/2022 1238   HCT 40.1 07/05/2014 1431   PLT 239 01/08/2022 1238   MCV 76 (L) 01/08/2022 1238   MCV 72 (L) 07/05/2014 1431   MCH 24.3 (L) 01/08/2022 1238   MCH 23.7 (L) 02/11/2021 0851   MCHC 32.0 01/08/2022 1238   MCHC 30.9 02/11/2021 0851   RDW 14.1 01/08/2022 1238   RDW 14.4 07/05/2014 1431   LYMPHSABS 3.3 02/11/2021 0851   LYMPHSABS 1.5 02/01/2021 1405   LYMPHSABS 1.3 07/05/2014 1431   MONOABS 1.2 (H) 02/11/2021 0851   EOSABS 0.7 (H) 02/11/2021 0851   EOSABS 0.2 02/01/2021 1405   EOSABS 0.1 07/05/2014 1431   BASOSABS 0.1 02/11/2021 0851   BASOSABS 0.1 02/01/2021 1405   BASOSABS 0.1 07/05/2014 1431    BMET    Component Value Date/Time   NA 145 (H) 01/08/2022 1238   K 4.7 01/08/2022 1238   CL 102 01/08/2022 1238   CO2 19 (L) 01/08/2022 1238   GLUCOSE 159 (H) 01/08/2022 1238   GLUCOSE 97 02/11/2021 0851   BUN 17 01/08/2022 1238   CREATININE 1.01 01/08/2022 1238   CREATININE 0.98 09/17/2012 1602   CALCIUM 9.9 01/08/2022 1238   GFRNONAA >60 02/11/2021 0851   GFRAA 83 03/21/2020 1019    BNP No results found for: "BNP"   Imaging:  CUP PACEART REMOTE DEVICE CHECK  Result Date: 02/04/2022 Scheduled remote reviewed. Normal device function.  Next remote 91 days- JJB        Latest Ref Rng & Units 05/30/2021    3:57 PM  PFT Results  FVC-Pre L 2.43   FVC-Predicted Pre % 62   FVC-Post L 2.57   FVC-Predicted Post % 65   Pre FEV1/FVC % % 59   Post FEV1/FCV % % 61   FEV1-Pre L 1.43   FEV1-Predicted Pre % 46   FEV1-Post L 1.58   DLCO uncorrected ml/min/mmHg 15.69   DLCO UNC% % 52   DLCO corrected ml/min/mmHg 16.66   DLCO COR  %Predicted % 56  DLVA Predicted % 87   TLC L 6.92   TLC % Predicted % 104   RV % Predicted % 187     Lab Results  Component Value Date   NITRICOXIDE 9 12/21/2015        Assessment & Plan:   Acute rhinosinusitis We will treat him with augmentin course for rhinosinusitis as well as acute otitis media. Target symptom management with mucinex, intranasal steroid, and saline nasal irrigations.   Patient Instructions  Continue Trelegy 1 puff daily. Brush tongue and rinse mouth afterwards Continue Albuterol inhaler 2 puffs or 3 mL neb every 6 hours as needed for shortness of breath or wheezing. Notify if symptoms persist despite rescue inhaler/neb use.  Continue Dupixent injections as scheduled  Continue prednisone 10 mg daily. Continue to try to taper down to lowest effective dose based on Dr. Gustavus Bryant previously recommendations.   Flonase nasal spray 2 sprays each nostril daily Guaifenesin [phone] mg Twice daily for congestion Augmentin 1 tab Twice daily for 10 days. Take with food. Eat yogurt or take a daily probiotic while taking Saline nasal irrigation 1-2 times a day   Referral to ENT - urgent referral placed  Follow up in 2 weeks with Dr. Melvyn Novas or Katie Keshayla Schrum,NP. If symptoms do not improve or worsen, please contact office for sooner follow up or seek emergency care.    Recurrent acute suppurative otitis media of both ears He tells me he has a history of recurrent otitis with chronic perforation of tympanic membranes. He was previously followed by ENT. Acute infection present today with purulent drainage from both ears. Augmentin XR for 10 days prescribed. I also have sent an urgent referral to ENT. Instructed him to go to the ED should he develop changes in hearing, fevers, chills, or worsening symptoms despite abx therapy.  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 Compensated on current regimen. He continues to try to taper prednisone to  lowest effective daily dose. No changes made to regimen today.    I spent 35 minutes of dedicated to the care of this patient on the date of this encounter to include pre-visit review of records, face-to-face time with the patient discussing conditions above, post visit ordering of testing, clinical documentation with the electronic health record, making appropriate referrals as documented, and communicating necessary findings to members of the patients care team.  Clayton Bibles, NP 02/28/2022  Pt aware and understands NP's role.

## 2022-02-28 NOTE — Assessment & Plan Note (Signed)
We will treat him with augmentin course for rhinosinusitis as well as acute otitis media. Target symptom management with mucinex, intranasal steroid, and saline nasal irrigations.   Patient Instructions  Continue Trelegy 1 puff daily. Brush tongue and rinse mouth afterwards Continue Albuterol inhaler 2 puffs or 3 mL neb every 6 hours as needed for shortness of breath or wheezing. Notify if symptoms persist despite rescue inhaler/neb use.  Continue Dupixent injections as scheduled  Continue prednisone 10 mg daily. Continue to try to taper down to lowest effective dose based on Dr. Gustavus Bryant previously recommendations.   Flonase nasal spray 2 sprays each nostril daily Guaifenesin [phone] mg Twice daily for congestion Augmentin 1 tab Twice daily for 10 days. Take with food. Eat yogurt or take a daily probiotic while taking Saline nasal irrigation 1-2 times a day   Referral to ENT - urgent referral placed  Follow up in 2 weeks with Dr. Melvyn Novas or Katie Pericles Carmicheal,NP. If symptoms do not improve or worsen, please contact office for sooner follow up or seek emergency care.

## 2022-02-28 NOTE — Assessment & Plan Note (Signed)
He tells me he has a history of recurrent otitis with chronic perforation of tympanic membranes. He was previously followed by ENT. Acute infection present today with purulent drainage from both ears. Augmentin XR for 10 days prescribed. I also have sent an urgent referral to ENT. Instructed him to go to the ED should he develop changes in hearing, fevers, chills, or worsening symptoms despite abx therapy.

## 2022-02-28 NOTE — Patient Instructions (Addendum)
Continue Trelegy 1 puff daily. Brush tongue and rinse mouth afterwards Continue Albuterol inhaler 2 puffs or 3 mL neb every 6 hours as needed for shortness of breath or wheezing. Notify if symptoms persist despite rescue inhaler/neb use.  Continue Dupixent injections as scheduled  Continue prednisone 10 mg daily. Continue to try to taper down to lowest effective dose based on Dr. Gustavus Bryant previously recommendations.   Flonase nasal spray 2 sprays each nostril daily Guaifenesin [phone] mg Twice daily for congestion Augmentin 1 tab Twice daily for 10 days. Take with food. Eat yogurt or take a daily probiotic while taking Saline nasal irrigation 1-2 times a day   Referral to ENT - urgent referral placed  Follow up in 2 weeks with Dr. Melvyn Novas or Katie Amiah Frohlich,NP. If symptoms do not improve or worsen, please contact office for sooner follow up or seek emergency care.

## 2022-02-28 NOTE — Assessment & Plan Note (Signed)
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 Compensated on current regimen. He continues to try to taper prednisone to lowest effective daily dose. No changes made to regimen today.

## 2022-03-03 ENCOUNTER — Telehealth: Payer: Self-pay | Admitting: Nurse Practitioner

## 2022-03-03 DIAGNOSIS — H66013 Acute suppurative otitis media with spontaneous rupture of ear drum, bilateral: Secondary | ICD-10-CM

## 2022-03-03 NOTE — Telephone Encounter (Signed)
Called and spoke with [name]. She stated that they received a RX for Augmentin XR '1000mg'$ -62.'5mg'$ . They do not have this stock and can not order it. She did confirm that they have amoxicillin '875mg'$ -'125mg'$  in stock if Joellen Jersey would like this instead.   Joellen Jersey, can you please advise? Thanks!

## 2022-03-04 MED ORDER — AMOXICILLIN-POT CLAVULANATE 875-125 MG PO TABS: 1.0000 | ORAL_TABLET | Freq: Two times a day (BID) | ORAL | 0 refills | Status: DC

## 2022-03-04 NOTE — Telephone Encounter (Signed)
Augmentin 875 Twice daily for 10 days. Thanks.

## 2022-03-04 NOTE — Telephone Encounter (Signed)
Augmentin 875 sent to health department

## 2022-03-12 ENCOUNTER — Other Ambulatory Visit: Payer: Self-pay

## 2022-03-12 ENCOUNTER — Telehealth: Payer: Self-pay | Admitting: Internal Medicine

## 2022-03-12 ENCOUNTER — Other Ambulatory Visit: Payer: Self-pay | Admitting: Cardiovascular Disease

## 2022-03-12 DIAGNOSIS — I5020 Unspecified systolic (congestive) heart failure: Secondary | ICD-10-CM

## 2022-03-12 DIAGNOSIS — I10 Essential (primary) hypertension: Secondary | ICD-10-CM

## 2022-03-12 MED ORDER — BISOPROLOL FUMARATE 5 MG PO TABS
2.5000 mg | ORAL_TABLET | Freq: Every day | ORAL | 0 refills | Status: DC
  Filled 2022-03-12 – 2022-03-26 (×2): qty 7, 14d supply, fill #0

## 2022-03-12 MED ORDER — VALSARTAN 40 MG PO TABS
40.0000 mg | ORAL_TABLET | Freq: Every day | ORAL | 0 refills | Status: DC
  Filled 2022-03-12 – 2022-03-26 (×2): qty 15, 15d supply, fill #0

## 2022-03-12 NOTE — Telephone Encounter (Signed)
Patient hand carried a letter from his Social Security disability attorney to office.  The letter asks a lot of questions regarding the patient's health and physical capabilities.  I've sent the letter to Dr. Melvyn Novas to complete when he is in office on 11/27.   When the letter is completed, the patient will need to be called to pick it up, or it will need to be mailed in the self-addressed envelope from the attorney.

## 2022-03-18 NOTE — Telephone Encounter (Signed)
Dr. Melvyn Novas completed the form.  I called patient and he said to go ahead and mail it to [place] in the postage paid envelope that was included.  He hung up before I could let him know about the $29 processing fee.

## 2022-03-19 ENCOUNTER — Other Ambulatory Visit: Payer: Self-pay

## 2022-03-26 ENCOUNTER — Other Ambulatory Visit: Payer: Self-pay

## 2022-03-31 ENCOUNTER — Ambulatory Visit: Payer: No Typology Code available for payment source | Admitting: Internal Medicine

## 2022-04-04 ENCOUNTER — Ambulatory Visit (INDEPENDENT_AMBULATORY_CARE_PROVIDER_SITE_OTHER): Payer: No Typology Code available for payment source | Admitting: Primary Care

## 2022-04-04 ENCOUNTER — Encounter: Payer: Self-pay | Admitting: Primary Care

## 2022-04-04 ENCOUNTER — Telehealth: Payer: Self-pay | Admitting: Internal Medicine

## 2022-04-04 VITALS — BP 110/62 | HR 75 | Temp 97.8°F | Ht 68.0 in | Wt 148.0 lb

## 2022-04-04 DIAGNOSIS — H7291 Unspecified perforation of tympanic membrane, right ear: Secondary | ICD-10-CM

## 2022-04-04 DIAGNOSIS — J329 Chronic sinusitis, unspecified: Secondary | ICD-10-CM

## 2022-04-04 DIAGNOSIS — H6691 Otitis media, unspecified, right ear: Secondary | ICD-10-CM

## 2022-04-04 DIAGNOSIS — H66016 Acute suppurative otitis media with spontaneous rupture of ear drum, recurrent, bilateral: Secondary | ICD-10-CM

## 2022-04-04 MED ORDER — LORATADINE 10 MG PO TABS: 10.0000 mg | ORAL_TABLET | Freq: Every day | ORAL | 1 refills | Status: AC

## 2022-04-04 NOTE — Progress Notes (Signed)
_0  ID: [name], male    DOB: [birth year], 61 y.o.   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 He is a patient of Dr. Gustavus Bryant and last seen in office 09/23/2021. Past medical history significant for CAD, HFrEF, cardiomyopathy s/p ICD, HTN, GERD, chronic sinusitis, HLD, recurrent otitis with perforated tympanic membrane.   Previous [facility] pulmonary encounter:  09/23/2021: Ov with Dr. Melvyn Novas. Maintained on Trelegy and prednisone 10 mg daily. Doing well. Has not had to use albuterol recently. Advised to start tapering prednisone to target of 2.5 mg per day. Continue dupixent.   02/28/2022: Today-follow-up Patient presents today for follow-up.  He is also having some acute symptoms.  He has had increased nasal congestion and white to yellow drainage over the past month to month and a half.  He is also had some pus colored drainage coming from his right ear.  He has had issues in the past with recurrent otitis and was previously followed by Dr. Lucia Gaskins but has not seen him in quite some time.  Fortunately, his breathing is stable.  He does not have any increased shortness of breath, wheezing or chest congestion.  Does have a minimal cough, which is unchanged from his baseline.  Denies any fevers, chills, headaches, sore throats, changes in hearing.  No viral testing or known sick exposures.  He continues on Trelegy daily.  He was only having to use his albuterol once a week but since he had increased nasal congestion, he has been using it once a day in the morning.  Not currently on any nasal sprays.  Dupixent injection is due today.  He tried to decrease prednisone to 5 mg.  Felt like his breathing got worse so he is back at 10 mg daily.  He tries at least once a month to start  tapering.  04/04/2022 - Interim hx  He is feeling better but still has nasal congestion, symptoms are worse in the morning. He has difficulty breathing through his nose at night. He is fine during the daytime. He still has a small amount of drainage from right ear. He has no ear pain. He uses saline nasal rinses 1-2 times a day. He is taking trelegy, prednisone 68m daily and dupixent as directed.    Allergies  Allergen Reactions   Doxycycline Rash    Immunization History  Administered Date(s) Administered   Hepatitis A, Adult 03/21/2015   Hepatitis B, adult 03/21/2015   Influenza Split 03/21/2011, 12/21/2011, 01/19/2013, 01/20/2015   Influenza Whole 01/20/2008, 01/30/2009   Influenza,inj,Quad PF,6+ Mos 12/21/2015, 05/06/2016, 01/05/2018, 04/27/2019, 01/10/2020   MMR 03/21/2015   Meningococcal Mcv4o 05/06/2016   PFIZER Comirnaty(Gray Top)Covid-19 Tri-Sucrose Vaccine 08/07/2020   PFIZER(Purple Top)SARS-COV-2 Vaccination 01/10/2020, 02/07/2020   Pneumococcal Polysaccharide-23 01/30/2009, 08/14/2017   Td 05/27/2004   Tdap 02/27/2015    Past Medical History:  Diagnosis Date   Asthma    CAD (coronary artery disease)    a. CAD s/p anterior MI in 2008 treated with DES to the LAD.   Cataract 2018   both eyes   Cellulitis of leg, right 10/2014   COPD (chronic obstructive pulmonary disease) (HCC)    Dental caries    Diabetes mellitus    TYPE 2   GERD (gastroesophageal reflux disease)  Hyperlipidemia    Hypertension 2017   Ischemic cardiomyopathy    Leukocytosis    noted on labs   Microcytic anemia    Myocardial infarction [facility]) 2008   Reactive airway disease    Thyroid nodule    biopsy negative 2006    Tobacco History: Social History   Tobacco Use  Smoking Status Former   Packs/day: 0.50   Years: 22.00   Total pack years: 11.00   Types: Cigarettes   Quit date: 04/21/2006   Years since quitting: 15.9  Smokeless Tobacco Never  Tobacco Comments   quit 10 years ago     Review of Systems  Review of Systems  Constitutional: Negative.   HENT:  Positive for congestion, ear discharge and sinus pressure. Negative for ear pain and hearing loss.   Respiratory:  Negative for chest tightness, shortness of breath and wheezing.    Physical Exam  BP 110/62 (BP Location: Left Arm, Patient Position: Sitting, Cuff Size: Normal)   Pulse 75   Temp 97.8 F (36.6 C) (Oral)   Ht _0  (1.727 m)   Wt 148 lb (67.1 kg)   SpO2 97%   BMI 22.50 kg/m  Physical Exam Constitutional:      Appearance: Normal appearance.  HENT:     Head: Normocephalic and atraumatic.     Right Ear: Drainage present. A middle ear effusion is present. There is no impacted cerumen. Tympanic membrane is perforated. Tympanic membrane is not injected.     Left Ear: No drainage. There is no impacted cerumen. Tympanic membrane is not injected.     Mouth/Throat:     Mouth: Mucous membranes are moist.     Pharynx: Oropharynx is clear.  Cardiovascular:     Rate and Rhythm: Normal rate and regular rhythm.  Musculoskeletal:        General: Normal range of motion.  Skin:    General: Skin is warm and dry.  Neurological:     General: No focal deficit present.     Mental Status: He is alert and oriented to person, place, and time. Mental status is at baseline.  Psychiatric:        Mood and Affect: Mood normal.        Behavior: Behavior normal.        Thought Content: Thought content normal.        Judgment: Judgment normal.      Lab Results:  CBC    Component Value Date/Time   WBC 15.7 (H) 01/08/2022 1238   WBC 15.7 (H) 02/11/2021 0851   RBC 6.34 (H) 01/08/2022 1238   RBC 5.95 (H) 02/11/2021 0851   HGB 15.4 01/08/2022 1238   HGB 13.3 07/05/2014 1431   HCT 48.1 01/08/2022 1238   HCT 40.1 07/05/2014 1431   PLT 239 01/08/2022 1238   MCV 76 (L) 01/08/2022 1238   MCV 72 (L) 07/05/2014 1431   MCH 24.3 (L) 01/08/2022 1238   MCH 23.7 (L) 02/11/2021 0851   MCHC 32.0 01/08/2022 1238    MCHC 30.9 02/11/2021 0851   RDW 14.1 01/08/2022 1238   RDW 14.4 07/05/2014 1431   LYMPHSABS 3.3 02/11/2021 0851   LYMPHSABS 1.5 02/01/2021 1405   LYMPHSABS 1.3 07/05/2014 1431   MONOABS 1.2 (H) 02/11/2021 0851   EOSABS 0.7 (H) 02/11/2021 0851   EOSABS 0.2 02/01/2021 1405   EOSABS 0.1 07/05/2014 1431   BASOSABS 0.1 02/11/2021 0851   BASOSABS 0.1 02/01/2021 1405   BASOSABS 0.1 07/05/2014 1431  BMET    Component Value Date/Time   NA 145 (H) 01/08/2022 1238   K 4.7 01/08/2022 1238   CL 102 01/08/2022 1238   CO2 19 (L) 01/08/2022 1238   GLUCOSE 159 (H) 01/08/2022 1238   GLUCOSE 97 02/11/2021 0851   BUN 17 01/08/2022 1238   CREATININE 1.01 01/08/2022 1238   CREATININE 0.98 09/17/2012 1602   CALCIUM 9.9 01/08/2022 1238   GFRNONAA >60 02/11/2021 0851   GFRAA 83 03/21/2020 1019    BNP No results found for: "BNP"  ProBNP    Component Value Date/Time   PROBNP 131.0 (H) 02/20/2007 0425    Imaging: No results found.   Assessment & Plan:   Otitis media with rupture of tympanic membrane, right - Symptoms improved, still having small amount of drainage from right hear. Completed Augmentin course. No ear pain or hearing changes. Referred ENT-urgently.   Sinusitis, chronic - Advised patient continue saline nasal rinses 1-2 times a day - Continue fluticasone nasal spray daily  - Start mucinex 600-1220m twice daily and Loratidine 113mdaily   ElMartyn EhrichNP 04/04/2022

## 2022-04-04 NOTE — Patient Instructions (Addendum)
Recommendations: Start Mucinex '600mg'$  twice daily x 7 days  Start Loratadine '10mg'$  daily Refer to ENT- urgent (please call to schedule visit [phone])  Follow-up: If symptoms worsen

## 2022-04-04 NOTE — Telephone Encounter (Signed)
Spoke with patient.   He denies any swelling, warmth or drainage at site.  No fever/chills.  He does have mychart and is working to upload a picture of the area and send to [place] for further review.  Patient is to call back if he is unable to send [place] the picture.

## 2022-04-04 NOTE — Assessment & Plan Note (Signed)
-   Advised patient continue saline nasal rinses 1-2 times a day - Continue fluticasone nasal spray daily  - Start mucinex 600-'1200mg'$  twice daily and Loratidine '10mg'$  daily

## 2022-04-04 NOTE — Assessment & Plan Note (Signed)
-   Symptoms improved, still having small amount of drainage from right hear. Completed Augmentin course. No ear pain or hearing changes. Referred ENT-urgently.

## 2022-04-04 NOTE — Telephone Encounter (Signed)
Patient states for the past 2 weeks he has experienced itching and redness around the site of his ICD. He denies having any CP or soreness.

## 2022-04-07 NOTE — Telephone Encounter (Signed)
Attempted to reach patient, VM full unable to leave message.  Never received picture of area around device as discussed with patient of Friday afternoon.   Will continue to try to reach patient to follow up.

## 2022-04-11 NOTE — Telephone Encounter (Signed)
Attempted to contact Pt.  No answer.  Unable to leave a message.

## 2022-04-16 NOTE — Telephone Encounter (Signed)
Sent my chart message.  Unable to reach patient. Requested her follow up asap if continued irritation.

## 2022-04-24 ENCOUNTER — Other Ambulatory Visit: Payer: Self-pay | Admitting: Family Medicine

## 2022-04-24 DIAGNOSIS — E118 Type 2 diabetes mellitus with unspecified complications: Secondary | ICD-10-CM

## 2022-04-29 ENCOUNTER — Other Ambulatory Visit: Payer: Self-pay

## 2022-04-29 DIAGNOSIS — I214 Non-ST elevation (NSTEMI) myocardial infarction: Secondary | ICD-10-CM

## 2022-04-29 DIAGNOSIS — I10 Essential (primary) hypertension: Secondary | ICD-10-CM

## 2022-04-29 DIAGNOSIS — I2510 Atherosclerotic heart disease of native coronary artery without angina pectoris: Secondary | ICD-10-CM

## 2022-04-29 DIAGNOSIS — I5020 Unspecified systolic (congestive) heart failure: Secondary | ICD-10-CM

## 2022-04-29 MED ORDER — ATORVASTATIN CALCIUM 80 MG PO TABS
80.0000 mg | ORAL_TABLET | Freq: Every day | ORAL | 0 refills | Status: DC
  Filled 2022-04-29: qty 30, 30d supply, fill #0

## 2022-04-29 MED ORDER — BISOPROLOL FUMARATE 5 MG PO TABS
2.5000 mg | ORAL_TABLET | Freq: Every day | ORAL | 0 refills | Status: DC
  Filled 2022-04-29: qty 15, 30d supply, fill #0

## 2022-04-29 MED ORDER — VALSARTAN 40 MG PO TABS
40.0000 mg | ORAL_TABLET | Freq: Every day | ORAL | 0 refills | Status: DC
  Filled 2022-04-29: qty 30, 30d supply, fill #0

## 2022-04-29 MED ORDER — CLOPIDOGREL BISULFATE 75 MG PO TABS
75.0000 mg | ORAL_TABLET | Freq: Every day | ORAL | 0 refills | Status: DC
  Filled 2022-04-29: qty 30, 30d supply, fill #0

## 2022-04-29 NOTE — Telephone Encounter (Signed)
Patient walked in to request refill for Plavix, Lipitor, valsartan, and bisoprolol. Gave patient refill for each to last him until his next appointment with a provider. Informed patient to request more refills at his appointment. Patient verbalized understanding.

## 2022-04-30 ENCOUNTER — Other Ambulatory Visit: Payer: Self-pay

## 2022-05-06 ENCOUNTER — Ambulatory Visit: Payer: MEDICAID | Attending: Internal Medicine

## 2022-05-06 DIAGNOSIS — I255 Ischemic cardiomyopathy: Secondary | ICD-10-CM

## 2022-05-06 LAB — CUP PACEART REMOTE DEVICE CHECK
Battery Remaining Longevity: 162 mo
Battery Remaining Percentage: 100 %
Brady Statistic RV Percent Paced: 0 %
Date Time Interrogation Session: 20240116094800
HighPow Impedance: 91 Ohm
Implantable Lead Connection Status: 753985
Implantable Lead Implant Date: 20221017
Implantable Lead Location: 753860
Implantable Lead Model: 138
Implantable Lead Serial Number: 304145
Implantable Pulse Generator Implant Date: 20221017
Lead Channel Impedance Value: 478 Ohm
Lead Channel Setting Pacing Amplitude: 2.5 V
Lead Channel Setting Pacing Pulse Width: 0.4 ms
Lead Channel Setting Sensing Sensitivity: 0.5 mV
Pulse Gen Serial Number: 213143

## 2022-05-15 NOTE — Progress Notes (Signed)
Cardiology Office Note Date:  05/16/2022  Patient ID:  [name], [name] March 20, 1961, MRN [id number] PCP:  Alcus Dad, MD  Cardiologist:  Lauree Chandler, MD Electrophysiologist: Cristopher Peru, MD  Chief Complaint: 1year follow-up  History of Present Illness: [name] is a 62 y.o. male with PMH notable for HTN, CAD, VT, ICM, HFrEF s/p ICD; seen today for Cristopher Peru, MD for routine electrophysiology followup. Since last being seen in our clinic the patient reports doing well. He has intermittent dizziness when systolic BP is in 40N, skips valsartan those days. He has short palpitation episodes every couple months, not bothersome. He has lower extremity edema daily, that resolves with leg elevation. Is not on diuretic.     he denies chest pain, palpitations, dyspnea, PND, orthopnea, nausea, vomiting, dizziness, syncope, edema, weight gain, or early satiety.    Device Information: [employer] ICD, impl 01/2021; dx VT, ICD, HFrEF  Past Medical History:  Diagnosis Date   Asthma    CAD (coronary artery disease)    a. CAD s/p anterior MI in 2008 treated with DES to the LAD.   Cataract 2018   both eyes   Cellulitis of leg, right 10/2014   COPD (chronic obstructive pulmonary disease) ([place])    Dental caries    Diabetes mellitus    TYPE 2   GERD (gastroesophageal reflux disease)    Hyperlipidemia    Hypertension 2017   Ischemic cardiomyopathy    Leukocytosis    noted on labs   Microcytic anemia    Myocardial infarction [facility]) 2008   Reactive airway disease    Thyroid nodule    biopsy negative 2006    Past Surgical History:  Procedure Laterality Date   CORONARY STENT INTERVENTION N/A 09/20/2020   Procedure: CORONARY STENT INTERVENTION;  Surgeon: Lorretta Harp, MD;  Location: [place] [facility];  Service: Cardiovascular;  Laterality: N/A;   CORONARY STENT PLACEMENT     ESOPHAGOGASTRODUODENOSCOPY N/A 03/29/2017   Procedure: ESOPHAGOGASTRODUODENOSCOPY (EGD);   Surgeon: Milus Banister, MD;  Location[facility] ENDOSCOPY;  Service: Endoscopy;  Laterality: N/A;   ESOPHAGOGASTRODUODENOSCOPY N/A 03/31/2018   Procedure: ESOPHAGOGASTRODUODENOSCOPY (EGD);  Surgeon: Doran Stabler, MD;  Location: [place];  Service: Gastroenterology;  Laterality: N/A;   FOREIGN BODY REMOVAL N/A 03/31/2018   Procedure: FOREIGN BODY REMOVAL;  Surgeon: Doran Stabler, MD;  Location: [place];  Service: Gastroenterology;  Laterality: N/A;   ICD IMPLANT N/A 02/04/2021   Procedure: ICD IMPLANT;  Surgeon: Evans Lance, MD;  Location: [place] [facility];  Service: Cardiovascular;  Laterality: N/A;   LEFT HEART CATH AND CORONARY ANGIOGRAPHY N/A 09/20/2020   Procedure: LEFT HEART CATH AND CORONARY ANGIOGRAPHY;  Surgeon: Lorretta Harp, MD;  Location: [place] [facility];  Service: Cardiovascular;  Laterality: N/A;   solitary pulm and thryoid nodules      Current Outpatient Medications  Medication Instructions   albuterol (PROAIR HFA) 108 (90 Base) MCG/ACT inhaler INHALE 2 PUFFS INTO THE LUNGS EVERY 4 HOURS AS NEEDED FOR WHEEZING OR SHORTNESS OF BREATH (PLAN B).   albuterol (PROVENTIL) 2.5 mg, Nebulization, Every 6 hours PRN   amoxicillin-clavulanate (AUGMENTIN) 875-125 MG tablet 1 tablet, Oral, 2 times daily, For 10 days   aspirin EC 81 mg, Oral, Daily   atorvastatin (LIPITOR) 80 mg, Oral, Daily at bedtime   bisoprolol (ZEBETA) 5 MG tablet Take 0.5 tablets (2.5 mg total) by mouth daily.    calcium-vitamin D (OSCAL WITH D) 250-125 MG-UNIT  tablet 1 tablet, Oral, 2 times daily   clopidogrel (PLAVIX) 75 mg, Oral, Daily with breakfast   Dupixent 300 mg, Subcutaneous, Every 14 days   FARXIGA 10 MG TABS tablet TAKE 1 TABLET ('10MG'$  TOTAL) BY MOUTH DAILY.   ferrous sulfate 325 mg, Oral, Daily with breakfast   fluticasone (FLONASE) 50 MCG/ACT nasal spray 1 spray, Each Nare, Daily   Fluticasone-Umeclidin-Vilant (TRELEGY ELLIPTA) 200-62.5-25 MCG/ACT AEPB 1 puff, Inhalation, Daily    loratadine (CLARITIN) 10 mg, Oral, Daily   metFORMIN (GLUCOPHAGE) 1,000 mg, Oral, 2 times daily with meals   Multiple Vitamin (MULTIVITAMIN WITH MINERALS) TABS tablet 1 tablet, Oral, Daily   nitroGLYCERIN (NITROSTAT) 0.4 mg, Sublingual, Every 5 min x3 PRN   omeprazole (PRILOSEC OTC) 20 mg, Oral, Daily   predniSONE (DELTASONE) 10 mg, Oral, Daily with breakfast   valsartan (DIOVAN) 40 MG tablet Take 1 tablet (40 mg total) by mouth daily     Social History:  The patient  reports that he quit smoking about 16 years ago. His smoking use included cigarettes. He has a 11.00 pack-year smoking history. He has never used smokeless tobacco. He reports that he does not drink alcohol and does not use drugs.   Family History:  The patient's family history includes Diabetes in his mother; Heart attack (age of onset: 64) in his brother.  ROS:  Please see the history of present illness. All other systems are reviewed and otherwise negative.   PHYSICAL EXAM: VS:  BP 126/64   Pulse 74   Ht '5\' 8"'$  (1.727 m)   Wt 144 lb (65.3 kg)   SpO2 94%   BMI 21.90 kg/m  BMI: Body mass index is 21.9 kg/m.  GEN- The patient is well appearing, alert and oriented x 3 today.   HEENT: normocephalic, atraumatic; sclera clear, conjunctiva pink; hearing intact; oropharynx clear; neck supple, no JVP Lungs- Clear to ausculation bilaterally, normal work of breathing.  No wheezes, rales, rhonchi Heart- Regular rate and rhythm, no murmurs, rubs or gallops, PMI not laterally displaced GI- soft, non-tender, non-distended, bowel sounds present, no hepatosplenomegaly Extremities- 1+ peripheral edema. no clubbing or cyanosis; DP/PT/radial pulses 2+ bilaterally MS- no significant deformity or atrophy Skin- warm and dry, no rash or lesion, device pocket well-healed Psych- euthymic mood, full affect Neuro- strength and sensation are intact   Device interrogation done today and reviewed by myself:  Battery good Lead thresholds,  impedence, sensing stable  No episodes No changes made today  EKG is ordered. Personal review of EKG from today shows:  NSR, rate 74bpm, LAD, poor r-wave progression  Recent Labs: 01/08/2022: BUN 17; Creatinine, Ser 1.01; Hemoglobin 15.4; Platelets 239; Potassium 4.7; Sodium 145  No results found for requested labs within last 365 days.   CrCl cannot be calculated (Patient's most recent lab result is older than the maximum 21 days allowed.).   Wt Readings from Last 3 Encounters:  05/16/22 144 lb (65.3 kg)  04/04/22 148 lb (67.1 kg)  02/28/22 151 lb (68.5 kg)     Additional studies reviewed include: Previous EP, cardiology notes.   ICD Implant, 02/04/2021 CONCLUSIONS:   1. Ischemic cardiomyopathy with chronic New York Heart Association class II heart failure and sustained VT   2. Successful ICD implantation.   3. DFT less than or equal to 21 joules.   4. No early apparent complications.   TTE - Limited, 12/28/2020  1. No apical thrombus with Definity contrast. Left ventricular ejection fraction, by estimation, is 30 to 35%.  The left ventricle demonstrates regional wall motion abnormalities (see scoring diagram/findings for description). There is mild left ventricular hypertrophy. Left ventricular diastolic parameters are consistent with Grade I diastolic dysfunction (impaired relaxation). There is severe akinesis and thinning of the left ventricular, entire anterior wall, anteroseptal wall, apical segment and inferoapical segment. 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  2. The mitral valve is grossly normal. Mild mitral valve regurgitation.  Comparison(s): No significant change from prior study. 09/21/2020: LVEF 30-35%, LAD territory infarct with apical aneurysm.  TTE, 09/21/2020  1. Left ventricular ejection fraction, by estimation, is 30 to 35%. The left ventricle has moderately decreased  function. The left ventricle demonstrates regional wall motion abnormalities. The mid-to-apical anteroseptal, mid-to-apical inferoseptal, mid-to-apical anterior, apical inferior, and apical lateral LV segments. Apex appears aneurysmal. No LV thrombus visualized. There is mild asymmetric left ventricular hypertrophy of the basal-septal segment. Left ventricular diastolic parameters are consistent with Grade I diastolic dysfunction (impaired relaxation). Elevated left atrial pressure.   2. Right ventricular systolic function is normal. The right ventricular size is normal.   3. The mitral valve is normal in structure. Trivial mitral valve regurgitation. No evidence of mitral stenosis.   4. The aortic valve is tricuspid. There is mild calcification of the aortic valve. There is mild thickening of the aortic valve. Aortic valve regurgitation is not visualized. Mild aortic valve sclerosis is present, with no evidence of aortic valve stenosis.   5. The inferior vena cava is normal in size with greater than 50% respiratory variability, suggesting right atrial pressure of 3 mmHg.   Comparison(s): Compared to prior study on 06/2019, there is no significant change. The LVEF appears similar based on side-by-side comparison at about 35% with akinesis in LAD territory and aneurysmal apex.    ASSESSMENT AND PLAN:  #) ICM, HFrEF s/p ICD Device functioning well, see paceart for details No changes made GDMT limited by hypotension  #) VT No therapies given, no VT on device  #) CAD No CP, palpitations, change in exertional capacity Patient questions whether he should cont plavix for previous stents. He is past due for follow-up with gen cards. 1 month rx provided and gen cards appt scheduled  #) HTN Hypotensive at times at home, well-controlled in office.  He will continue to monitor home BP, gen cards follow-up as above    Current medicines are reviewed at length with the patient today.   The patient  does not have concerns regarding his medicines.  The following changes were made today:  none  Labs/ tests ordered today include:  Orders Placed This Encounter  Procedures   EKG 12-Lead     Disposition: Follow up with Dr. Lovena Le in in 12 months   Signed, Mamie Levers, NP  05/16/22  12:20 PM  Electrophysiology [facility]

## 2022-05-16 ENCOUNTER — Telehealth: Payer: Self-pay | Admitting: Licensed Clinical Social Worker

## 2022-05-16 ENCOUNTER — Ambulatory Visit: Payer: Medicaid Other | Attending: Student | Admitting: Cardiology

## 2022-05-16 VITALS — BP 126/64 | HR 74 | Ht 68.0 in | Wt 144.0 lb

## 2022-05-16 DIAGNOSIS — I10 Essential (primary) hypertension: Secondary | ICD-10-CM

## 2022-05-16 DIAGNOSIS — I5020 Unspecified systolic (congestive) heart failure: Secondary | ICD-10-CM | POA: Diagnosis not present

## 2022-05-16 DIAGNOSIS — I255 Ischemic cardiomyopathy: Secondary | ICD-10-CM | POA: Diagnosis not present

## 2022-05-16 DIAGNOSIS — I2510 Atherosclerotic heart disease of native coronary artery without angina pectoris: Secondary | ICD-10-CM

## 2022-05-16 DIAGNOSIS — Z95810 Presence of automatic (implantable) cardiac defibrillator: Secondary | ICD-10-CM | POA: Diagnosis not present

## 2022-05-16 DIAGNOSIS — I214 Non-ST elevation (NSTEMI) myocardial infarction: Secondary | ICD-10-CM

## 2022-05-16 DIAGNOSIS — I472 Ventricular tachycardia, unspecified: Secondary | ICD-10-CM

## 2022-05-16 LAB — CUP PACEART INCLINIC DEVICE CHECK
Date Time Interrogation Session: 20240126123828
HighPow Impedance: 69 Ohm
HighPow Impedance: 79 Ohm
Implantable Lead Connection Status: 753985
Implantable Lead Implant Date: 20221017
Implantable Lead Location: 753860
Implantable Lead Model: 138
Implantable Lead Serial Number: 304145
Implantable Pulse Generator Implant Date: 20221017
Lead Channel Impedance Value: 484 Ohm
Lead Channel Pacing Threshold Amplitude: 1.2 V
Lead Channel Pacing Threshold Pulse Width: 0.4 ms
Lead Channel Sensing Intrinsic Amplitude: 23.4 mV
Lead Channel Setting Pacing Amplitude: 2.5 V
Lead Channel Setting Pacing Pulse Width: 0.4 ms
Lead Channel Setting Sensing Sensitivity: 0.5 mV
Pulse Gen Serial Number: 213143

## 2022-05-16 MED ORDER — CLOPIDOGREL BISULFATE 75 MG PO TABS: 75.0000 mg | ORAL_TABLET | Freq: Every day | ORAL | 0 refills | Status: DC

## 2022-05-16 NOTE — Telephone Encounter (Signed)
H&V Care Navigation CSW Progress Note  Clinical Social Worker  reviewed chart  to f/u as needed- pt noted as uninsured, previously had applied for [employer] and [employer]. Searched NCTracks and was able to see that pt has active Medicaid at this time- Managed by [facility] ID #[id number].  Patient is participating in a Managed Medicaid Plan:  Yes- Healthy [place]: No Food Insecurity (10/18/2020)  Housing: Medium Risk (10/18/2020)  Transportation Needs: No Transportation Needs (10/18/2020)  Depression (PHQ2-9): High Risk (01/08/2022)  Financial Resource Strain: High Risk (10/18/2020)  Tobacco Use: Medium Risk (04/04/2022)    Westley Hummer, MSW, [place]  [phone]- work cell phone (preferred) [phone]- desk phone

## 2022-05-16 NOTE — Patient Instructions (Signed)
Medication Instructions:   Your physician recommends that you continue on your current medications as directed. Please refer to the Current Medication list given to you today.  *If you need a refill on your cardiac medications before your next appointment, please call your pharmacy*   Lab Work:  None ordered.  If you have labs (blood work) drawn today and your tests are completely normal, you will receive your results only by: [place] (if you have MyChart) OR A paper copy in the mail If you have any lab test that is abnormal or we need to change your treatment, we will call you to review the results.   Testing/Procedures:  None ordered   Follow-Up: At [facility], you and your health needs are our priority.  As part of our continuing mission to provide you with exceptional heart care, we have created designated Provider Care Teams.  These Care Teams include your primary Cardiologist (physician) and Advanced Practice Providers (APPs -  Physician Assistants and Nurse Practitioners) who all work together to provide you with the care you need, when you need it.  We recommend signing up for the patient portal called "MyChart".  Sign up information is provided on this After Visit Summary.  MyChart is used to connect with patients for Virtual Visits (Telemedicine).  Patients are able to view lab/test results, encounter notes, upcoming appointments, etc.  Non-urgent messages can be sent to your provider as well.   To learn more about what you can do with MyChart, go to [email or web address].    Your next appointment:   1 week(s)  Provider:   Ambrose Pancoast, NP

## 2022-05-19 ENCOUNTER — Telehealth: Payer: Self-pay | Admitting: Pharmacist

## 2022-05-19 ENCOUNTER — Other Ambulatory Visit: Payer: Self-pay | Admitting: Pharmacist

## 2022-05-19 DIAGNOSIS — J4489 Other specified chronic obstructive pulmonary disease: Secondary | ICD-10-CM

## 2022-05-19 MED ORDER — DUPIXENT 300 MG/2ML ~~LOC~~ SOAJ: 300.0000 mg | SUBCUTANEOUS | 1 refills | Status: DC

## 2022-05-19 NOTE — Telephone Encounter (Signed)
Patient is currently enrolled into [place]. He was uninsured when he started Dupixent but appears to now be enrollde into [employer]. Please run PA for Dupixent through Medicaid. Would use note indicating improvement since starting.  Will likely be able to fill with [facility] once approved.  Knox Saliva, PharmD, MPH, BCPS, CPP Clinical Pharmacist (Rheumatology and Pulmonology)

## 2022-05-19 NOTE — Telephone Encounter (Signed)
Refill sent for [facility] to [facility]: [phone]  Dose: 300 mg SQ every 2 weeks  Last OV: 04/04/2022 Provider: Dr. Melvyn Novas  Next OV: not yet scheduled  Knox Saliva, PharmD, MPH, BCPS Clinical Pharmacist (Rheumatology and Pulmonology)

## 2022-05-21 ENCOUNTER — Other Ambulatory Visit (HOSPITAL_COMMUNITY): Payer: Self-pay

## 2022-05-21 NOTE — Telephone Encounter (Signed)
Submitted a Prior Authorization request to  [employer]  for [name] via CoverMyMeds. Authorization has been APPROVED from 05/21/2022 to 11/17/2022.  Approval letter not attached available via CMM, will send to scan center once faxed to clinic.   Test Claim revealed that a 28 day supply has a copay of $4.00.   Patient can fill through [place]: [id number]     CMM KEY: [id number] Authorization#: [id number]

## 2022-05-22 NOTE — Progress Notes (Signed)
Office Visit    Patient Name: [name] Date of Encounter: 05/22/2022  Primary Care Provider:  Alcus Dad, MD Primary Cardiologist:  Lauree Chandler, MD Primary Electrophysiologist: Cristopher Peru, MD  Chief Complaint    [name] is a 62 y.o. male with PMH of CAD s/p anterior MI 2008 treated with DES to LAD, microcytic anemia, HFrEF, ICM, asthma, HLD, HTN, COPD, VT s/p ICD 01/2021 who presents today for follow-up of coronary artery disease.  Past Medical History    Past Medical History:  Diagnosis Date   Asthma    CAD (coronary artery disease)    a. CAD s/p anterior MI in 2008 treated with DES to the LAD.   Cataract 2018   both eyes   Cellulitis of leg, right 10/2014   COPD (chronic obstructive pulmonary disease) ([place])    Dental caries    Diabetes mellitus    TYPE 2   GERD (gastroesophageal reflux disease)    Hyperlipidemia    Hypertension 2017   Ischemic cardiomyopathy    Leukocytosis    noted on labs   Microcytic anemia    Myocardial infarction [facility]) 2008   Reactive airway disease    Thyroid nodule    biopsy negative 2006   Past Surgical History:  Procedure Laterality Date   CORONARY STENT INTERVENTION N/A 09/20/2020   Procedure: CORONARY STENT INTERVENTION;  Surgeon: Lorretta Harp, MD;  Location: [place] [facility];  Service: Cardiovascular;  Laterality: N/A;   CORONARY STENT PLACEMENT     ESOPHAGOGASTRODUODENOSCOPY N/A 03/29/2017   Procedure: ESOPHAGOGASTRODUODENOSCOPY (EGD);  Surgeon: Milus Banister, MD;  Location: [facility];  Service: Endoscopy;  Laterality: N/A;   ESOPHAGOGASTRODUODENOSCOPY N/A 03/31/2018   Procedure: ESOPHAGOGASTRODUODENOSCOPY (EGD);  Surgeon: Doran Stabler, MD;  Location: [place];  Service: Gastroenterology;  Laterality: N/A;   FOREIGN BODY REMOVAL N/A 03/31/2018   Procedure: FOREIGN BODY REMOVAL;  Surgeon: Doran Stabler, MD;  Location: [place];  Service: Gastroenterology;  Laterality: N/A;    ICD IMPLANT N/A 02/04/2021   Procedure: ICD IMPLANT;  Surgeon: Evans Lance, MD;  Location: [place] [facility];  Service: Cardiovascular;  Laterality: N/A;   LEFT HEART CATH AND CORONARY ANGIOGRAPHY N/A 09/20/2020   Procedure: LEFT HEART CATH AND CORONARY ANGIOGRAPHY;  Surgeon: Lorretta Harp, MD;  Location: [place] [facility];  Service: Cardiovascular;  Laterality: N/A;   solitary pulm and thryoid nodules      Allergies  Allergies  Allergen Reactions   Doxycycline Rash    History of Present Illness    [name]  is a 62 year old male with the above mention past medical history who presents today for follow-up of coronary artery disease.  [name] has been followed by Dr. Angelena Form since 2012 for management of CAD.  2D echo in 2016 showed EF of 35-40% with akinesis LV thrombus and aneurysm forming with grade 1 DD.  GDMT was optimized and 2D echo completed 2020 showed reduced EF of 25 to 30%.  He presented to the ED 09/20/2020 for complaint of chest pain that reminded him of his MI in 2008.  He also had complaints of palpitations with lightheadedness and EMS was contacted and patient found to be in wide-complex tachycardia and given amiodarone and route to hospital when he arrived he was unstable and blood pressures were in the 70s requiring DCCV at 100 J and returned to sinus rhythm.  LHC was performed and showed widely patent LAD with  stenosis and proximal RCA that was treated with DES x 1.  Patient was consulted by Dr. Lovena Le for evaluation of VT NICM.  2D echo was completed and revealed EF of 30-35% with anterior septal/inferior septal lateral wall abnormalities.  He was placed on a LifeVest and repeat 2D echo completed 3 months later showing reduced EF of 30% and therefore requiring ICD implant.  Patient tolerated ICD implant without any complications.  He has not had any recurrence of VT since implantation.  He was recently seen by Pamala Duffel, NP for follow-up.  During visit patient  was doing well with some intermittent dizziness and occasional short palpitations.  He skips his valsartan when his blood pressure is in the 90 systolic.  There were no changes made to his therapy at that time.  [name] presents today for follow-up visit alone.  Since last being seen in the office patient reports that he has been doing well with no new cardiac complaints.  He has been compliant with his current medication regimen and denies any adverse reactions.  His blood pressure today was 134/62 with heart rate of 88.  He is euvolemic today on examination and denies any shortness of breath at rest.  He does note that when walking upstairs and walking up a hill he does have to take frequent breaks.  He is currently on disability and is no longer working.  He reports that he has been watching his salt intake and was walking 30 minutes/day when weather permitted.  During today's visit we discussed his current treatment plan and plan for optimizing his GDMT.  We also discussed the pathophysiology of ingestive heart failure patient had all questions answered to his satisfaction.  Patient denies chest pain, palpitations, dyspnea, PND, orthopnea, nausea, vomiting, dizziness, syncope, edema, weight gain, or early satiety.  Home Medications        Review of Systems  Please see the history of present illness.    (+) Fatigue (+) Shortness of breath with exertion  All other systems reviewed and are otherwise negative except as noted above.  Physical Exam    Wt Readings from Last 3 Encounters:  05/16/22 144 lb (65.3 kg)  04/04/22 148 lb (67.1 kg)  02/28/22 151 lb (68.5 kg)   CX:KGYJE were no vitals filed for this visit.,There is no height or weight on file to calculate BMI.  Constitutional:      Appearance: Healthy appearance. Not in distress.  Neck:     Vascular: JVD normal.  Pulmonary:     Effort: Pulmonary effort is normal.     Breath sounds: No wheezing. No rales. Diminished in the  bases Cardiovascular:     Normal rate. Regular rhythm. Normal S1. Normal S2.      Murmurs: There is no murmur.  Edema:    Peripheral edema absent.  Abdominal:     Palpations: Abdomen is soft non tender. There is no hepatomegaly.  Skin:    General: Skin is warm and dry.  Neurological:     General: No focal deficit present.     Mental Status: Alert and oriented to person, place and time.     Cranial Nerves: Cranial nerves are intact.   EKG/LABS/Other Studies Reviewed    ECG personally reviewed by me today -none completed today    Lab Results  Component Value Date   WBC 15.7 (H) 01/08/2022   HGB 15.4 01/08/2022   HCT 48.1 01/08/2022   MCV 76 (L) 01/08/2022   PLT 239  01/08/2022   Lab Results  Component Value Date   CREATININE 1.01 01/08/2022   BUN 17 01/08/2022   NA 145 (H) 01/08/2022   K 4.7 01/08/2022   CL 102 01/08/2022   CO2 19 (L) 01/08/2022   Lab Results  Component Value Date   ALT 24 05/02/2021   AST 21 05/02/2021   ALKPHOS 50 05/02/2021   BILITOT 0.3 05/02/2021   Lab Results  Component Value Date   CHOL 101 11/09/2020   HDL 59 11/09/2020   LDLCALC 20 11/09/2020   LDLDIRECT 91 08/14/2017   TRIG 129 11/09/2020   CHOLHDL 1.7 11/09/2020    Lab Results  Component Value Date   HGBA1C 7.2 (A) 01/08/2022    Assessment & Plan    1.  Coronary artery disease: -s/p anterior MI 2008 treated with DES to LAD -Today patient reports no new cardiac complaints.  We will reach out to Dr. Angelena Form regarding continuation of Plavix for DAPT or ASA therapy. -He is currently walking 30 minutes/day however he has been hindered by his asthma and weather.  He is planning to resume activities once weather improves. -Continue current GDMT with Plavix 75 mg, ASA 81 mg, Lipitor 80 mg  2.  Ischemic cardiomyopathy/VT: -s/p ICD implantation for history of sustained VT and secondary prevention -Patient recently seen by EP with no device changes recommended  3.  HFrEF: -Most  recent 2D echo completed in 12/2020 with reduced EF of 30-35% -We will reorder 2D echo today to reevaluate patient's EF following his ICD placement. -He is NYHA class I/II and reports no shortness of breath at rest. -Pending findings of his 2D echo we will optimize GDMT with spironolactone 12.5 mg daily -Continue GDMT with Farxiga 10 mg, Diovan 40 mg, bisoprolol 5 mg  4.  Essential hypertension: -Patient's blood pressure today is well-controlled at 134/62 -Continue Diovan and bisoprolol  5.  Hyperlipidemia: -We will recheck patient's lipids and LFTs -Continue atorvastatin 80 mg daily.  Disposition: Follow-up with Lauree Chandler, MD or APP in 12 months    Medication Adjustments/Labs and Tests Ordered: Current medicines are reviewed at length with the patient today.  Concerns regarding medicines are outlined above.   Signed, Mable Fill, Marissa Nestle, NP 05/22/2022, 4:20 PM [facility]  Note:  This document was prepared using Dragon voice recognition software and may include unintentional dictation errors.

## 2022-05-23 ENCOUNTER — Ambulatory Visit: Payer: Medicaid Other | Attending: Nurse Practitioner | Admitting: Nurse Practitioner

## 2022-05-23 ENCOUNTER — Encounter: Payer: Self-pay | Admitting: Nurse Practitioner

## 2022-05-23 VITALS — BP 134/62 | HR 88 | Ht 68.0 in | Wt 144.4 lb

## 2022-05-23 DIAGNOSIS — I2510 Atherosclerotic heart disease of native coronary artery without angina pectoris: Secondary | ICD-10-CM

## 2022-05-23 DIAGNOSIS — I472 Ventricular tachycardia, unspecified: Secondary | ICD-10-CM | POA: Diagnosis not present

## 2022-05-23 DIAGNOSIS — I214 Non-ST elevation (NSTEMI) myocardial infarction: Secondary | ICD-10-CM | POA: Diagnosis not present

## 2022-05-23 DIAGNOSIS — I255 Ischemic cardiomyopathy: Secondary | ICD-10-CM

## 2022-05-23 DIAGNOSIS — I10 Essential (primary) hypertension: Secondary | ICD-10-CM | POA: Diagnosis not present

## 2022-05-23 DIAGNOSIS — I5020 Unspecified systolic (congestive) heart failure: Secondary | ICD-10-CM

## 2022-05-23 MED ORDER — ATORVASTATIN CALCIUM 80 MG PO TABS: 80.0000 mg | ORAL_TABLET | Freq: Every day | ORAL | 2 refills | Status: DC

## 2022-05-23 NOTE — Patient Instructions (Signed)
Medication Instructions:  Your physician recommends that you continue on your current medications as directed. Please refer to the Current Medication list given to you today. *If you need a refill on your cardiac medications before your next appointment, please call your pharmacy*   Lab Work: LFT & LIPIDS (SAME DAY AS ECHO) If you have labs (blood work) drawn today and your tests are completely normal, you will receive your results only by: [place] (if you have MyChart) OR A paper copy in the mail If you have any lab test that is abnormal or we need to change your treatment, we will call you to review the results.   Testing/Procedures: Your physician has requested that you have an echocardiogram. Echocardiography is a painless test that uses sound waves to create images of your heart. It provides your doctor with information about the size and shape of your heart and how well your heart's chambers and valves are working. This procedure takes approximately one hour. There are no restrictions for this procedure. Please do NOT wear cologne, perfume, aftershave, or lotions (deodorant is allowed). Please arrive 15 minutes prior to your appointment time.   Follow-Up: At [facility], you and your health needs are our priority.  As part of our continuing mission to provide you with exceptional heart care, we have created designated Provider Care Teams.  These Care Teams include your primary Cardiologist (physician) and Advanced Practice Providers (APPs -  Physician Assistants and Nurse Practitioners) who all work together to provide you with the care you need, when you need it.  We recommend signing up for the patient portal called "MyChart".  Sign up information is provided on this After Visit Summary.  MyChart is used to connect with patients for Virtual Visits (Telemedicine).  Patients are able to view lab/test results, encounter notes, upcoming appointments, etc.  Non-urgent  messages can be sent to your provider as well.   To learn more about what you can do with MyChart, go to [email or web address].    Your next appointment:   12 month(s)  Provider:   Lauree Chandler, MD     Other Instructions

## 2022-05-26 ENCOUNTER — Telehealth: Payer: Self-pay

## 2022-05-26 NOTE — Telephone Encounter (Signed)
Patient has been notified directly; all questions, if any, were answered. Patient voiced understanding.   Plavix has been removed from the patients list.

## 2022-05-26 NOTE — Telephone Encounter (Signed)
-----   Message from Marylu Lund., NP sent at 05/26/2022  7:04 AM EST ----- Good morning, Please contact Mr.Golob and advised him to stop his Plavix 75 mg today.  Per Dr. Angelena Form he can now take ASA 81 mg only moving forward.  Please let me know if you have any additional questions.  Ambrose Pancoast, NP ----- Message ----- From: Burnell Blanks, MD Sent: 05/25/2022   6:54 PM EST To: Marylu Lund., NP; Rodman Key, RN  He can go back to ASA only. Thanks, Gerald Stabs ----- Message ----- From: Marylu Lund., NP Sent: 05/23/2022   4:22 PM EST To: Burnell Blanks, MD  Mr.Brechtel was seen in clinic today for follow-up visit.  He has been doing well from cardiac perspective with no new complaints.  He is currently still on Plavix 75 mg with 81 mg aspirin.  I was reaching out to see if you would like him to continue his Plavix or moved to aspirin only.   Thank you Jaquelyn Bitter

## 2022-05-30 NOTE — Progress Notes (Signed)
Remote ICD transmission.   

## 2022-06-02 ENCOUNTER — Other Ambulatory Visit (HOSPITAL_COMMUNITY): Payer: Self-pay

## 2022-06-02 MED ORDER — TRELEGY ELLIPTA 200-62.5-25 MCG/ACT IN AEPB: 1.0000 | INHALATION_SPRAY | Freq: Every day | RESPIRATORY_TRACT | 6 refills | Status: DC

## 2022-06-02 MED ORDER — DUPIXENT 300 MG/2ML ~~LOC~~ SOAJ
300.0000 mg | SUBCUTANEOUS | 0 refills | Status: DC
  Filled 2022-06-02: qty 12, 84d supply, fill #0

## 2022-06-02 NOTE — Telephone Encounter (Signed)
Rx for Dupixent sent to [facility]. I called patient regarding approval. He states he is receiving shipment from [facility] next Tuesday 06/10/2022. It will be for 3 month supply. Refill will need to be sent to [facility] on 08/09/2022.  Knox Saliva, PharmD, MPH, BCPS, CPP Clinical Pharmacist (Rheumatology and Pulmonology)

## 2022-06-13 ENCOUNTER — Other Ambulatory Visit (HOSPITAL_COMMUNITY): Payer: Medicaid Other

## 2022-06-16 ENCOUNTER — Ambulatory Visit: Payer: Medicaid Other

## 2022-06-16 ENCOUNTER — Ambulatory Visit (HOSPITAL_COMMUNITY): Payer: Medicaid Other | Attending: Cardiology

## 2022-06-16 DIAGNOSIS — I10 Essential (primary) hypertension: Secondary | ICD-10-CM

## 2022-06-16 DIAGNOSIS — I255 Ischemic cardiomyopathy: Secondary | ICD-10-CM | POA: Diagnosis not present

## 2022-06-16 DIAGNOSIS — I5020 Unspecified systolic (congestive) heart failure: Secondary | ICD-10-CM

## 2022-06-16 DIAGNOSIS — I2510 Atherosclerotic heart disease of native coronary artery without angina pectoris: Secondary | ICD-10-CM | POA: Diagnosis not present

## 2022-06-16 DIAGNOSIS — I214 Non-ST elevation (NSTEMI) myocardial infarction: Secondary | ICD-10-CM | POA: Diagnosis not present

## 2022-06-16 DIAGNOSIS — I472 Ventricular tachycardia, unspecified: Secondary | ICD-10-CM

## 2022-06-16 LAB — ECHOCARDIOGRAM COMPLETE
Area-P 1/2: 3.42 cm2
S' Lateral: 3.9 cm

## 2022-06-16 MED ORDER — PERFLUTREN LIPID MICROSPHERE
1.0000 mL | INTRAVENOUS | Status: AC | PRN
  Administered 2022-06-16: 2 mL via INTRAVENOUS

## 2022-06-17 LAB — HEPATIC FUNCTION PANEL
ALT: 27 IU/L (ref 0–44)
AST: 19 IU/L (ref 0–40)
Albumin: 4.2 g/dL (ref 3.9–4.9)
Alkaline Phosphatase: 62 IU/L (ref 44–121)
Bilirubin Total: 0.3 mg/dL (ref 0.0–1.2)
Bilirubin, Direct: 0.11 mg/dL (ref 0.00–0.40)
Total Protein: 6.4 g/dL (ref 6.0–8.5)

## 2022-06-17 LAB — LIPID PANEL
Chol/HDL Ratio: 2.1 ratio (ref 0.0–5.0)
Cholesterol, Total: 105 mg/dL (ref 100–199)
HDL: 50 mg/dL (ref >39)
LDL Chol Calc (NIH): 38 mg/dL (ref 0–99)
Triglycerides: 90 mg/dL (ref 0–149)
VLDL Cholesterol Cal: 17 mg/dL (ref 5–40)

## 2022-06-18 ENCOUNTER — Other Ambulatory Visit: Payer: Self-pay

## 2022-06-18 DIAGNOSIS — I5020 Unspecified systolic (congestive) heart failure: Secondary | ICD-10-CM

## 2022-06-26 ENCOUNTER — Telehealth: Payer: Self-pay | Admitting: Internal Medicine

## 2022-06-26 NOTE — Telephone Encounter (Signed)
Pharm: Shelbie Ammons  Trelegy not covered by his ins, he said. Was told by pharmacy to call us. Please advise PT of an alternative.  Has none left. Can provide sample for PT?   His # is [phone] Pls call Pt.

## 2022-06-27 NOTE — Telephone Encounter (Signed)
Dr. Melvyn Novas, please advise if you are okay with [place] giving pt a sample for Trelegy and please advise if you have any recommendations on inhaler for pt since it is not covered by his insurance.

## 2022-06-27 NOTE — Telephone Encounter (Signed)
Pt calling to get his trelegy

## 2022-06-27 NOTE — Telephone Encounter (Signed)
Ok to do pharmacy consult, give him a sample of trelegy 100 in meantime

## 2022-06-30 ENCOUNTER — Other Ambulatory Visit (HOSPITAL_COMMUNITY): Payer: Self-pay

## 2022-06-30 ENCOUNTER — Telehealth: Payer: Self-pay

## 2022-06-30 MED ORDER — TRELEGY ELLIPTA 100-62.5-25 MCG/ACT IN AEPB: 1.0000 | INHALATION_SPRAY | Freq: Every day | RESPIRATORY_TRACT | 0 refills | Status: DC

## 2022-06-30 NOTE — Telephone Encounter (Signed)
PA request received via providers office for Trelegy Ellipta 200-62.5-25MCG/ACT aerosol powder  PA has been submitted via CMM for Trelegy Ellipta 200-62.5-25MCG/ACT aerosol powder to [facility] and has been APPROVED from 06/30/2022-06/29/2023  Key: [employer]

## 2022-06-30 NOTE — Telephone Encounter (Signed)
Called and spoke with patient. Patient verbalized understanding. Advised patient I would leave samples upfront for patient to pick up.    Routing to devki and brighton for pharmacy consult.

## 2022-06-30 NOTE — Telephone Encounter (Signed)
PA has been approved for Trelegy.

## 2022-07-09 ENCOUNTER — Other Ambulatory Visit: Payer: Self-pay | Admitting: Family Medicine

## 2022-07-09 DIAGNOSIS — E118 Type 2 diabetes mellitus with unspecified complications: Secondary | ICD-10-CM

## 2022-07-28 ENCOUNTER — Encounter: Payer: Self-pay | Admitting: Family Medicine

## 2022-07-28 ENCOUNTER — Ambulatory Visit: Payer: Medicaid Other | Admitting: Family Medicine

## 2022-07-28 VITALS — BP 125/65 | HR 86 | Ht 68.0 in | Wt 147.4 lb

## 2022-07-28 DIAGNOSIS — Z125 Encounter for screening for malignant neoplasm of prostate: Secondary | ICD-10-CM | POA: Diagnosis not present

## 2022-07-28 DIAGNOSIS — H7293 Unspecified perforation of tympanic membrane, bilateral: Secondary | ICD-10-CM

## 2022-07-28 DIAGNOSIS — Z0000 Encounter for general adult medical examination without abnormal findings: Secondary | ICD-10-CM

## 2022-07-28 DIAGNOSIS — E782 Mixed hyperlipidemia: Secondary | ICD-10-CM

## 2022-07-28 DIAGNOSIS — E118 Type 2 diabetes mellitus with unspecified complications: Secondary | ICD-10-CM

## 2022-07-28 LAB — POCT GLYCOSYLATED HEMOGLOBIN (HGB A1C): HbA1c, POC (controlled diabetic range): 7.3 % — AB (ref 0.0–7.0)

## 2022-07-28 MED ORDER — METFORMIN HCL 1000 MG PO TABS: 1000.0000 mg | ORAL_TABLET | Freq: Two times a day (BID) | ORAL | 0 refills | Status: DC

## 2022-07-28 NOTE — Patient Instructions (Signed)
It was great to see you!  Things we discussed at today's visit: - Your A1c was 7.3% today, which means your diabetes is not as well-controlled as we'd like. Your goal A1c is <7%. Continue your current medications and work on improved dietary choices. We will recheck your A1c in 3 months. If it's still elevated, we will start a new medication (once weekly injection).  -You are overdue for a diabetic eye exam. Please contact your eye doctor to schedule an appointment. Ask them to fax the notes to our office.  -You are overdue for a colonoscopy.  Please call [facility] to schedule [address] [place], [place], [place] [address] Phone: [phone]  -To find out your blood type, go to the red cross and donate blood. Afterwards they will send you a card with your blood type. This is free.  We are checking a blood test to screen for prostate cancer. I will send you a MyChart message with the results.  Take care and seek immediate care sooner if you develop any concerns.  Follow up in 3 months.  Dr. [facility]

## 2022-07-28 NOTE — Progress Notes (Signed)
    SUBJECTIVE:   Chief complaint/HPI: annual examination  [name] is a 62 y.o. male who presents today for an annual exam.   Current concerns: R ear draining: has upcoming appt with ENT Going for the Hajj in June and needs to know blood type for it. Offered to obtain type & screen although this is unlikely to be covered by insurance. He will go to [employer] instead and return if any issues.  History tabs reviewed and updated.   OBJECTIVE:   BP 125/65   Pulse 86   Ht 5\' 8"  (1.727 m)   Wt 147 lb 6.4 oz (66.9 kg)   SpO2 97%   BMI 22.41 kg/m   Gen: NAD, pleasant, able to participate in exam HEENT: [place]/AT, PERRLA, nares patent bilaterally, R ear: chronic TM perforation with clear drainage, intact portion of TM is pearly pink with good light reflex, L ear: TM appears normal; oropharynx unremarkable Neck: supple, no cervical or supraclavicular lymphadenopathy CV: RRR, normal S1/S2, no murmur Resp: Normal effort, lungs CTAB GI: abdomen soft, non-tender, non-distended Extremities: no edema or cyanosis Skin: warm and dry, no rashes noted Neuro: alert, no obvious focal deficits Psych: Normal affect and mood  ASSESSMENT/PLAN:   Type 2 diabetes mellitus with complication, without long-term current use of insulin (HCC) A1c 7.3% today. Goal A1c <7%. -Recommended addition of GLP-1 but patient declines. Prefers to work on dietary changes and recheck A1c in 3 months. 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 While there is slight drainage in right ear canal, no significant evidence of infection therefore no  indication for antibiotics today. ENT previously recommended surgical intervention although this was deferred and he has not followed up in several years. Has upcoming appointment with ENT which I emphasized the importance of attending.  Mixed hyperlipidemia LDL at goal. 38 on most recent lipid panel 06/16/22. -Continue atorvastatin 80mg  daily    Annual Examination  PHQ score 5, reviewed and discussed.  Blood pressure value is at goal, discussed.   Considered the following screening exams based upon USPSTF recommendations: Diabetes screening:  see diabetes above Screening for elevated cholesterol:  see HLD above HIV testing:  Negative in 2022, repeat not indicated Hepatitis C:  Negative in 2019, repeat not indicated Syphilis if at high risk:  not at high risk Colorectal cancer screening:  Due for colonoscopy.  Patient given information for [facility] in [place]. Lung cancer screening:  Not indicated , quit in 2008 (>15 years ago) PSA discussed and after engaging in discussion of possible risks, benefits and complications of screening patient elected to proceed.   Follow up in 1 year or sooner if indicated.    Maury Dus, MD [facility]

## 2022-07-28 NOTE — Assessment & Plan Note (Addendum)
Patient with longstanding TM perforation (h/o bilateral perforation although only R noted on today's exam). While there is slight drainage in right ear canal, no significant evidence of infection therefore no indication for antibiotics today. ENT previously recommended surgical intervention although this was deferred and he has not followed up in several years. Has upcoming appointment with ENT which I emphasized the importance of attending.

## 2022-07-28 NOTE — Assessment & Plan Note (Signed)
LDL at goal. 38 on most recent lipid panel 06/16/22. -Continue atorvastatin 80mg  daily

## 2022-07-28 NOTE — Assessment & Plan Note (Addendum)
A1c 7.3% today. Goal A1c <7%. -Recommended addition of GLP-1 but patient declines. Prefers to work on dietary changes and recheck A1c in 3 months. If persistently elevated he is agreeable to GLP-1 at that time -Continue Metformin 1000mg  BID and Farxiga 10mg  daily -Check urine microalbumin today -Continue ARB, statin -Overdue for diabetic eye exam, advised he schedule at his earliest convenience -UTD on foot exam

## 2022-07-29 LAB — MICROALBUMIN / CREATININE URINE RATIO
Creatinine, Urine: 47.7 mg/dL
Microalb/Creat Ratio: 10 mg/g creat (ref 0–29)
Microalbumin, Urine: 5 ug/mL

## 2022-07-29 LAB — PSA: Prostate Specific Ag, Serum: 1.6 ng/mL (ref 0.0–4.0)

## 2022-08-04 ENCOUNTER — Telehealth: Payer: Self-pay

## 2022-08-04 ENCOUNTER — Ambulatory Visit (INDEPENDENT_AMBULATORY_CARE_PROVIDER_SITE_OTHER): Payer: Medicaid Other | Admitting: Student

## 2022-08-04 DIAGNOSIS — Z7184 Encounter for health counseling related to travel: Secondary | ICD-10-CM

## 2022-08-04 MED ORDER — TYPHOID VACCINE PO CPDR
1.0000 | DELAYED_RELEASE_CAPSULE | ORAL | 0 refills | Status: DC
Start: 2022-08-04 — End: 2022-08-04

## 2022-08-04 MED ORDER — ATOVAQUONE-PROGUANIL HCL 250-100 MG PO TABS
1.0000 | ORAL_TABLET | Freq: Every day | ORAL | 0 refills | Status: DC
Start: 2022-08-04 — End: 2022-08-28

## 2022-08-04 NOTE — Patient Instructions (Addendum)
You will need to go to the health department for your Yellow Fever vaccine.  Take your malaria medicine starting one or two days before leaving for your trip and continue for two weeks after you return.   You should take the typhoid vaccine at the Health Department also, note this is different from what your wife is doing!   Eliezer Mccoy, MD

## 2022-08-04 NOTE — Telephone Encounter (Signed)
Patient presenting to front desk, reports his monitor for his AICD is blinking and displaying the word "call". Patient does not have monitor with him. Checked with Burlene Arnt, a transmission was received on 08/02/22, Kem thinks the monitor just needs and update. Leisure centre manager number was provided to patient. Patient verbalized understanding to call and check on status of monitor as monitor may just be receiving and wireless update. Patient w/ defib check scheduled for tomorrow 08/05/22.

## 2022-08-05 ENCOUNTER — Ambulatory Visit (INDEPENDENT_AMBULATORY_CARE_PROVIDER_SITE_OTHER): Payer: Medicaid Other

## 2022-08-05 DIAGNOSIS — I255 Ischemic cardiomyopathy: Secondary | ICD-10-CM | POA: Diagnosis not present

## 2022-08-05 NOTE — Assessment & Plan Note (Signed)
Planning a trip to [place] in [place].  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  Therefore certainly he will need a typhoid vaccine.  Had considered the live vaccine, however this is contraindicated in his case as he is on Dupixent and prednisone.  Discussed with him that he is able to receive the inactivated IM vaccine, but this will need to be given at the HD. Do think risk/benefit analysis tips in favor of vaccination given active outbreak in [place] region.  -Will need to get yellow fever vaccine and inactivated typhoid IM vaccine at the health department -I have prescribed atovaquone-proguanil for malaria prophylaxis

## 2022-08-05 NOTE — Progress Notes (Signed)
    SUBJECTIVE:   CHIEF COMPLAINT / HPI:   Pre-Travel Encounter Patient here with his wife to discuss necessary vaccines and prophylaxis before their upcoming trip.  They are leaving in about a month for a prolonged trip to [place] on [place]ajj to [place] and then returning to their familial home in the  [place]. Reviewed vaccination requirements for travel to these regions on the [facility] website.  They will need malaria prophylaxis, typhoid vaccination, and yellow fever vaccination.  Additional requirement for meningococcal vaccination but they assure me they have had this previously. His case is somewhat complicated by his use of immunosuppressants including Dupixent and prednisone which she is on chronically for difficult to control asthma.   OBJECTIVE:   There were no vitals taken for this visit.  General: alert & oriented, no apparent distress, well groomed HEENT: normocephalic, atraumatic, EOM grossly intact, oral mucosa moist, neck supple Respiratory: normal respiratory effort GI: non-distended Skin: no rashes, no jaundice Psych: appropriate mood and affect   ASSESSMENT/PLAN:   Travel advice encounter Planning a trip to [place] in [place].  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  Therefore certainly he will need a typhoid vaccine.  Had considered the live vaccine, however this is contraindicated in his case as he is on Dupixent and prednisone.  Discussed with him that he is able to receive the inactivated IM vaccine, but this will need to be given at the HD. Do think risk/benefit analysis tips in favor of vaccination given active outbreak in [place] region.  -Will need to get yellow fever vaccine and inactivated typhoid IM vaccine at the health department -I have prescribed atovaquone-proguanil for malaria prophylaxis     J Dorothyann Gibbs, MD [facility]

## 2022-08-08 ENCOUNTER — Other Ambulatory Visit: Payer: Self-pay

## 2022-08-08 ENCOUNTER — Other Ambulatory Visit (HOSPITAL_COMMUNITY): Payer: Self-pay

## 2022-08-08 MED ORDER — DUPIXENT 300 MG/2ML ~~LOC~~ SOAJ
300.0000 mg | SUBCUTANEOUS | 0 refills | Status: DC
Start: 2022-08-08 — End: 2022-12-17
  Filled 2022-08-08 – 2022-08-20 (×2): qty 12, 84d supply, fill #0

## 2022-08-08 NOTE — Addendum Note (Signed)
Addended by: Murrell Redden on: 08/08/2022 10:24 AM   Modules accepted: Orders

## 2022-08-08 NOTE — Telephone Encounter (Signed)
Rx for Dupixent sent to [facility]. I think he should have at least one pen at home right now.  Routing to [place] [facility] for onboarding needs  Chesley Mires, PharmD, MPH, BCPS, CPP Clinical Pharmacist (Rheumatology and Pulmonology)

## 2022-08-12 ENCOUNTER — Other Ambulatory Visit: Payer: Self-pay | Admitting: Cardiovascular Disease

## 2022-08-12 ENCOUNTER — Other Ambulatory Visit: Payer: Self-pay

## 2022-08-12 DIAGNOSIS — I10 Essential (primary) hypertension: Secondary | ICD-10-CM

## 2022-08-12 DIAGNOSIS — I5020 Unspecified systolic (congestive) heart failure: Secondary | ICD-10-CM

## 2022-08-12 LAB — CUP PACEART REMOTE DEVICE CHECK
Battery Remaining Longevity: 162 mo
Battery Remaining Percentage: 100 %
Brady Statistic RV Percent Paced: 0 %
Date Time Interrogation Session: 20240422200600
HighPow Impedance: 78 Ohm
Implantable Lead Connection Status: 753985
Implantable Lead Implant Date: 20221017
Implantable Lead Location: 753860
Implantable Lead Model: 138
Implantable Lead Serial Number: 304145
Implantable Pulse Generator Implant Date: 20221017
Lead Channel Impedance Value: 396 Ohm
Lead Channel Setting Pacing Amplitude: 2.5 V
Lead Channel Setting Pacing Pulse Width: 0.4 ms
Lead Channel Setting Sensing Sensitivity: 0.5 mV
Pulse Gen Serial Number: 213143

## 2022-08-12 MED ORDER — BISOPROLOL FUMARATE 5 MG PO TABS
2.5000 mg | ORAL_TABLET | Freq: Every day | ORAL | 3 refills | Status: AC
  Filled 2022-08-12: qty 15, 30d supply, fill #0
  Filled 2022-09-12: qty 15, 30d supply, fill #1
  Filled 2023-05-01: qty 15, 30d supply, fill #2

## 2022-08-12 MED ORDER — VALSARTAN 40 MG PO TABS
40.0000 mg | ORAL_TABLET | Freq: Every day | ORAL | 3 refills | Status: DC
Start: 2022-08-12 — End: 2023-08-13
  Filled 2022-08-12: qty 90, 90d supply, fill #0
  Filled 2023-01-08: qty 90, 90d supply, fill #1
  Filled 2023-05-01: qty 90, 90d supply, fill #2

## 2022-08-14 ENCOUNTER — Other Ambulatory Visit: Payer: Self-pay

## 2022-08-20 ENCOUNTER — Other Ambulatory Visit: Payer: Self-pay | Admitting: Family Medicine

## 2022-08-20 ENCOUNTER — Other Ambulatory Visit: Payer: Self-pay

## 2022-08-20 ENCOUNTER — Other Ambulatory Visit (HOSPITAL_COMMUNITY): Payer: Self-pay

## 2022-08-20 DIAGNOSIS — E118 Type 2 diabetes mellitus with unspecified complications: Secondary | ICD-10-CM

## 2022-08-20 NOTE — Telephone Encounter (Signed)
Delivery instructions have been updated in [place], medication will be shipped to patient's home address on 08/21/22.  Rx has been processed in [facility] and there is a copay of $4.00. Payment information has been collected and forwarded to the pharmacy.

## 2022-08-28 ENCOUNTER — Telehealth: Payer: Self-pay | Admitting: *Deleted

## 2022-08-28 ENCOUNTER — Ambulatory Visit: Payer: Medicaid Other | Admitting: Family Medicine

## 2022-08-28 ENCOUNTER — Encounter: Payer: Self-pay | Admitting: Family Medicine

## 2022-08-28 VITALS — BP 136/77 | HR 80 | Temp 97.9°F | Ht 68.0 in | Wt 149.4 lb

## 2022-08-28 DIAGNOSIS — Z7184 Encounter for health counseling related to travel: Secondary | ICD-10-CM

## 2022-08-28 DIAGNOSIS — E118 Type 2 diabetes mellitus with unspecified complications: Secondary | ICD-10-CM | POA: Diagnosis not present

## 2022-08-28 MED ORDER — ATOVAQUONE-PROGUANIL HCL 250-100 MG PO TABS
1.0000 | ORAL_TABLET | Freq: Every day | ORAL | 0 refills | Status: AC
Start: 2022-08-28 — End: ?

## 2022-08-28 MED ORDER — ATORVASTATIN CALCIUM 80 MG PO TABS: 80.0000 mg | ORAL_TABLET | Freq: Every day | ORAL | 1 refills | Status: DC

## 2022-08-28 MED ORDER — METFORMIN HCL 1000 MG PO TABS: 1000.0000 mg | ORAL_TABLET | Freq: Two times a day (BID) | ORAL | 1 refills | Status: DC

## 2022-08-28 NOTE — Telephone Encounter (Signed)
S/w pt to set up for telephone clearance.  Will send to device clinic to review.     Patient Consent for Virtual Visit         [name] has provided verbal consent on 08/28/2022 for a virtual visit (video or telephone).   CONSENT FOR VIRTUAL VISIT FOR:  [name]  By participating in this virtual visit I agree to the following:  I hereby voluntarily request, consent and authorize [facility] and its employed or contracted physicians, physician assistants, nurse practitioners or other licensed health care professionals (the Practitioner), to provide me with telemedicine health care services (the "Services") as deemed necessary by the treating Practitioner. I acknowledge and consent to receive the Services by the Practitioner via telemedicine. I understand that the telemedicine visit will involve communicating with the Practitioner through live audiovisual communication technology and the disclosure of certain medical information by electronic transmission. I acknowledge that I have been given the opportunity to request an in-person assessment or other available alternative prior to the telemedicine visit and am voluntarily participating in the telemedicine visit.  I understand that I have the right to withhold or withdraw my consent to the use of telemedicine in the course of my care at any time, without affecting my right to future care or treatment, and that the Practitioner or I may terminate the telemedicine visit at any time. I understand that I have the right to inspect all information obtained and/or recorded in the course of the telemedicine visit and may receive copies of available information for a reasonable fee.  I understand that some of the potential risks of receiving the Services via telemedicine include:  Delay or interruption in medical evaluation due to technological equipment failure or disruption; Information transmitted may not be sufficient (e.g. poor  resolution of images) to allow for appropriate medical decision making by the Practitioner; and/or  In rare instances, security protocols could fail, causing a breach of personal health information.  Furthermore, I acknowledge that it is my responsibility to provide information about my medical history, conditions and care that is complete and accurate to the best of my ability. I acknowledge that Practitioner's advice, recommendations, and/or decision may be based on factors not within their control, such as incomplete or inaccurate data provided by me or distortions of diagnostic images or specimens that may result from electronic transmissions. I understand that the practice of medicine is not an exact science and that Practitioner makes no warranties or guarantees regarding treatment outcomes. I acknowledge that a copy of this consent can be made available to me via my patient portal [facility] MyChart), or I can request a printed copy by calling the office of [facility].    I understand that my insurance will be billed for this visit.   I have read or had this consent read to me. I understand the contents of this consent, which adequately explains the benefits and risks of the Services being provided via telemedicine.  I have been provided ample opportunity to ask questions regarding this consent and the Services and have had my questions answered to my satisfaction. I give my informed consent for the services to be provided through the use of telemedicine in my medical care

## 2022-08-28 NOTE — Telephone Encounter (Signed)
Not sure if you need to see this or not. Routing as FYI.

## 2022-08-28 NOTE — Progress Notes (Signed)
    SUBJECTIVE:   CHIEF COMPLAINT / HPI:   Travel Advice Will be traveling to [place] next month for the Hajj. Would like to discuss vaccination and malaria prophylaxis. Was seen for this on 08/04/22.  Malaria prophylaxis sent to his pharmacy but patient states he has not been able to get this.  Was also advised to go to health department for vaccines but patient unaware of how to do this.  Plans to follow-up with his cardiologist prior to his trip as well.  Medication Refills Needs refills on his metformin 1000mg  twice daily and atorvastatin 80mg  daily.  Request 87-month supply as he will be out of the country for 1 month.  PERTINENT  PMH / PSH: steroid-dependent asthma/COPD, history of MI, ischemic cardiomyopathy, CAD, HFrEF, HTN  OBJECTIVE:   BP 136/77   Pulse 80   Temp 97.9 F (36.6 C)   Ht 5\' 8"  (1.727 m)   Wt 149 lb 6.4 oz (67.8 kg)   SpO2 95%   BMI 22.72 kg/m   General: NAD, pleasant, able to participate in exam Respiratory: No respiratory distress Skin: warm and dry, no rashes noted Psych: Normal affect and mood Neuro: grossly intact  ASSESSMENT/PLAN:   Travel advice encounter Traveling to [place] for the Hajj next month. Spoke with pharmacy- they did not fill prior Rx for malarone for unclear reasons. New Rx sent and advised to start 2 days prior to leaving and continue for 1 week upon return. Patient was given information for Health Department to get yellow fever and inactivated typhoid vaccines (inactivated due to immunosuppression from Dupixent and chronic steroids).   Medication refills Refill sent on metformin 1000 mg twice daily and atorvastatin 80 mg daily.   Follow-up in approximately 2 months for diabetes visit.  Maury Dus, MD [facility]

## 2022-08-28 NOTE — Assessment & Plan Note (Signed)
Traveling to [place] for the Hajj next month. Spoke with pharmacy- they did not fill prior Rx for malarone for unclear reasons. New Rx sent and advised to start 2 days prior to leaving and continue for 1 week upon return. Patient was given information for Health Department to get yellow fever and inactivated typhoid vaccines (inactivated due to immunosuppression from Dupixent and chronic steroids).

## 2022-08-28 NOTE — Telephone Encounter (Signed)
I s/w the DDS to clarify how many teeth are being extracted and if they are simple or surgical extractions.     Pre-operative Risk Assessment    Patient Name: [name]  DOB: 01/23/1961 MRN: [id number]     Request for Surgical Clearance    Procedure:   EXTRACTION OF  24 TEETH; PER SUSIE IN DDS OFFICE 3 TEETH ARE SURGICAL EXTRACTION AND 21 TEETH ARE SIMPLE EXTRACTIONS  Date of Surgery:  Clearance TBD                                 Surgeon:  DR. Warden Fillers, DDS Surgeon's Group or Practice Name:  [facility] Phone number:  [phone] Fax number:  [phone]   Type of Clearance Requested:   - Medical ; ASA    Type of Anesthesia:  Local  WITH EPINEPHRINE   Additional requests/questions:    Elpidio Anis   08/28/2022, 11:42 AM

## 2022-08-28 NOTE — Telephone Encounter (Signed)
   Name: [name]  DOB: 1961/03/22  MRN: [id number]  Primary Cardiologist: Verne Carrow, MD  Chart reviewed as part of pre-operative protocol coverage. Because of [name] past medical history and time since last visit, he will require a follow-up telephone visit in order to better assess preoperative cardiovascular risk.  Pre-op covering staff: - Please schedule appointment and call patient to inform them. If patient already had an upcoming appointment within acceptable timeframe, please add "pre-op clearance" to the appointment notes so provider is aware. - Please contact requesting surgeon's office via preferred method (i.e, phone, fax) to inform them of need for appointment prior to surgery.  As long as asymptomatic during telephone visit, can hold aspirin x 7 days prior to multiple extractions.  Please resume when medically safe to do so.  Sharlene Dory, PA-C  08/28/2022, 12:08 PM

## 2022-08-28 NOTE — Patient Instructions (Addendum)
It was great to see you!  There should be 3 medications at your pharmacy: -Metformin (diabetes medicine) -Atorvastatin (cholesterol medicine) -Atovaquone-Proguanil, also called malarone (malaria prevention medication).   Start the malaria medication 2 days before you leave. Continue it throughout your trip and for 1 week when you return.  Schedule an appointment when you get back to re-check your diabetes numbers and discuss whether we need a once weekly injection medication.  Contact the Health Department regarding the yellow fever and inactivated typhoid vaccines. [address] [place] [phone] to schedule an appointment   Enjoy your trip! It was a pleasure taking care of you. You will meet your new doctor at your next visit.  Dr Anner Crete

## 2022-09-03 ENCOUNTER — Telehealth: Payer: Self-pay

## 2022-09-03 ENCOUNTER — Encounter: Payer: Self-pay | Admitting: Nurse Practitioner

## 2022-09-03 ENCOUNTER — Encounter: Payer: Self-pay | Admitting: Internal Medicine

## 2022-09-03 ENCOUNTER — Ambulatory Visit: Payer: Medicaid Other | Attending: Cardiovascular Disease | Admitting: Nurse Practitioner

## 2022-09-03 DIAGNOSIS — Z01810 Encounter for preprocedural cardiovascular examination: Secondary | ICD-10-CM | POA: Diagnosis not present

## 2022-09-03 NOTE — Telephone Encounter (Signed)
ERRONEOUS ENCOUNTER

## 2022-09-03 NOTE — Telephone Encounter (Signed)
Unsure as to whether clearance is needed for simple and surgical dental extractions but wanted to send to you guys for review to ensure patient safety.  Thank you, [name]

## 2022-09-03 NOTE — Progress Notes (Signed)
Virtual Visit via Telephone Note   Because of [name]'s co-morbid illnesses, he is at least at moderate risk for complications without adequate follow up.  This format is felt to be most appropriate for this patient at this time.  The patient did not have access to video technology/had technical difficulties with video requiring transitioning to audio format only (telephone).  All issues noted in this document were discussed and addressed.  No physical exam could be performed with this format.  Please refer to the patient's chart for his consent to telehealth for [facility].  Evaluation Performed:  Preoperative cardiovascular risk assessment _____________   Date:  09/03/2022   Patient ID:  [name], DOB 04-19-1961, MRN [id number] Patient Location:  Home Provider location:   Office  Primary Care Provider:  Maury Dus, MD Primary Cardiologist:  Verne Carrow, MD  Chief Complaint / Patient Profile   62 y.o. y/o male with a h/o CAD s/p anterior MI 2008 treated with DES to LAD, microcytic anemia, HFrEF, ICM, asthma, HLD, HTN, COPD, VT s/p ICD 01/2021 who is pending extraction of 24 teeth, 3 are surgical extraction and 21 simple extractions and presents today for telephonic preoperative cardiovascular risk assessment.  History of Present Illness    [name] is a 62 y.o. male who presents via audio/video conferencing for a telehealth visit today.  Pt was last seen in cardiology clinic on 05/23/22 by Robin Searing, NP.  At that time [name] was doing well.  The patient is now pending procedure as outlined above. Since his last visit, he  denies chest pain, shortness of breath, lower extremity edema, fatigue, palpitations, melena, hematuria, hemoptysis, diaphoresis, weakness, presyncope, syncope, orthopnea, and PND. He remains active with walking for exercise and is able to achieve > 4 METS activity without concerning cardiac symptoms.   Past Medical  History    Past Medical History:  Diagnosis Date   Asthma    CAD (coronary artery disease)    a. CAD s/p anterior MI in 2008 treated with DES to the LAD.   Cataract 2018   both eyes   Cellulitis of leg, right 10/2014   COPD (chronic obstructive pulmonary disease) (HCC)    Dental caries    Diabetes mellitus    TYPE 2   GERD (gastroesophageal reflux disease)    Hyperlipidemia    Hypertension 2017   Ischemic cardiomyopathy    Leukocytosis    noted on labs   Microcytic anemia    Myocardial infarction [facility]) 2008   Reactive airway disease    Thyroid nodule    biopsy negative 2006   Past Surgical History:  Procedure Laterality Date   CORONARY STENT INTERVENTION N/A 09/20/2020   Procedure: CORONARY STENT INTERVENTION;  Surgeon: Runell Gess, MD;  Location: [facility] INVASIVE CV LAB;  Service: Cardiovascular;  Laterality: N/A;   CORONARY STENT PLACEMENT     ESOPHAGOGASTRODUODENOSCOPY N/A 03/29/2017   Procedure: ESOPHAGOGASTRODUODENOSCOPY (EGD);  Surgeon: Rachael Fee, MD;  Location[facility] ENDOSCOPY;  Service: Endoscopy;  Laterality: N/A;   ESOPHAGOGASTRODUODENOSCOPY N/A 03/31/2018   Procedure: ESOPHAGOGASTRODUODENOSCOPY (EGD);  Surgeon: Sherrilyn Rist, MD;  Location: [facility] ENDOSCOPY;  Service: Gastroenterology;  Laterality: N/A;   FOREIGN BODY REMOVAL N/A 03/31/2018   Procedure: FOREIGN BODY REMOVAL;  Surgeon: Sherrilyn Rist, MD;  Location: [facility] ENDOSCOPY;  Service: Gastroenterology;  Laterality: N/A;   ICD IMPLANT N/A 02/04/2021   Procedure: ICD IMPLANT;  Surgeon: Marinus Maw, MD;  Location: [facility] INVASIVE  CV LAB;  Service: Cardiovascular;  Laterality: N/A;   LEFT HEART CATH AND CORONARY ANGIOGRAPHY N/A 09/20/2020   Procedure: LEFT HEART CATH AND CORONARY ANGIOGRAPHY;  Surgeon: Runell Gess, MD;  Location: [facility] INVASIVE CV LAB;  Service: Cardiovascular;  Laterality: N/A;   solitary pulm and thryoid nodules      Allergies  Allergies  Allergen Reactions   Doxycycline Rash     Home Medications    Prior to Admission medications   Medication Sig Start Date End Date Taking? Authorizing Provider  albuterol (PROAIR HFA) 108 (90 Base) MCG/ACT inhaler INHALE 2 PUFFS INTO THE LUNGS EVERY 4 HOURS AS NEEDED FOR WHEEZING OR SHORTNESS OF BREATH (PLAN B). 03/29/21   Nyoka Cowden, MD  albuterol (PROVENTIL) (2.5 MG/3ML) 0.083% nebulizer solution Take 3 mLs (2.5 mg total) by nebulization every 6 (six) hours as needed for wheezing or shortness of breath. 03/26/21   Merrilee Jansky, MD  aspirin EC 81 MG tablet Take 1 tablet (81 mg total) by mouth daily. 11/14/20   Kathleene Hazel, MD  atorvastatin (LIPITOR) 80 MG tablet Take 1 tablet (80 mg total) by mouth daily. 08/28/22   Maury Dus, MD  atovaquone-proguanil (MALARONE) 250-100 MG TABS tablet Take 1 tablet by mouth daily. 08/28/22   Maury Dus, MD  bisoprolol (ZEBETA) 5 MG tablet Take 0.5 tablets (2.5 mg total) by mouth daily. Please call our office to schedule an overdue appointment with Dr. Clifton James before anymore refills. [phone].  08/12/22   Kathleene Hazel, MD  calcium-vitamin D (OSCAL WITH D) 250-125 MG-UNIT tablet Take 1 tablet by mouth 2 (two) times daily.    [provider]  Dupilumab (DUPIXENT) 300 MG/2ML SOPN Inject 300 mg into the skin every 14 (fourteen) days. 08/08/22   Nyoka Cowden, MD  FARXIGA 10 MG TABS tablet TAKE 1 TABLET (10MG  TOTAL) BY MOUTH DAILY. 07/10/22   Maury Dus, MD  ferrous sulfate 325 (65 FE) MG tablet Take 325 mg by mouth daily with breakfast.    [provider]  fluticasone (FLONASE) 50 MCG/ACT nasal spray Place 1 spray into both nostrils daily. 02/28/22   Cobb, Ruby Cola, NP  Fluticasone-Umeclidin-Vilant (TRELEGY ELLIPTA) 100-62.5-25 MCG/ACT AEPB Inhale 1 puff into the lungs daily. 06/30/22   Nyoka Cowden, MD  Fluticasone-Umeclidin-Vilant (TRELEGY ELLIPTA) 200-62.5-25 MCG/ACT AEPB Inhale 1 puff into the lungs daily. 06/02/22   Martina Sinner,  MD  loratadine (CLARITIN) 10 MG tablet Take 1 tablet (10 mg total) by mouth daily. Patient not taking: Reported on 07/28/2022 04/04/22   Glenford Bayley, NP  metFORMIN (GLUCOPHAGE) 1000 MG tablet Take 1 tablet (1,000 mg total) by mouth 2 (two) times daily with a meal. 08/28/22   Maury Dus, MD  Multiple Vitamin (MULTIVITAMIN WITH MINERALS) TABS tablet Take 1 tablet by mouth daily.    [provider]  nitroGLYCERIN (NITROSTAT) 0.4 MG SL tablet Place 1 tablet (0.4 mg total) under the tongue every 5 (five) minutes x 3 doses as needed for chest pain. 09/22/20   Corrin Parker, PA-C  omeprazole (PRILOSEC OTC) 20 MG tablet Take 20 mg by mouth daily.    [provider]  predniSONE (DELTASONE) 10 MG tablet Take 10 mg by mouth daily with breakfast.    [provider]  valsartan (DIOVAN) 40 MG tablet Take 1 tablet (40 mg total) by mouth daily. 08/12/22   Kathleene Hazel, MD    Physical Exam    Vital Signs:  Joud Kinslow does  not have vital signs available for review today.  Given telephonic nature of communication, physical exam is limited. AAOx3. NAD. Normal affect.  Speech and respirations are unlabored.  Accessory Clinical Findings    None  Assessment & Plan    1.  Preoperative Cardiovascular Risk Assessment: According to the Revised Cardiac Risk Index (RCRI), his Perioperative Risk of Major Cardiac Event is (%): 6.6. His Functional Capacity in METs is: 6.7 according to the Duke Activity Status Index (DASI). The patient is doing well from a cardiac perspective. Therefore, based on ACC/AHA guidelines, the patient would be at acceptable risk for the planned procedure without further cardiovascular testing.    The patient was advised that if he develops new symptoms prior to surgery to contact our office to arrange for a follow-up visit, and he verbalized understanding.  Per office protocol, he may hold aspirin for 7 days prior to procedure and should  resume as soon as hemodynamically stable postoperatively.   A copy of this note will be routed to requesting surgeon.  Time:   Today, I have spent 7 minutes with the patient with telehealth technology discussing medical history, symptoms, and management plan.     Levi Aland, NP-C  09/03/2022, 10:33 AM [address] Office [phone] Fax [phone]

## 2022-09-03 NOTE — Progress Notes (Signed)
PERIOPERATIVE PRESCRIPTION FOR IMPLANTED CARDIAC DEVICE PROGRAMMING  Patient Information: Name:  [name]  DOB:  July 04, 1960  MRN:  [id number]    I s/w the DDS to clarify how many teeth are being extracted and if they are simple or surgical extractions.  Procedure:   EXTRACTION OF  24 TEETH; PER SUSIE IN DDS OFFICE 3 TEETH ARE SURGICAL EXTRACTION AND 21 TEETH ARE SIMPLE EXTRACTIONS   Date of Surgery:  Clearance TBD                                 Surgeon:  DR. Warden Fillers, DDS Surgeon's Group or Practice Name:  [facility] Phone number:  [phone] Fax number:  [phone]   Type of Clearance Requested:   - Medical ; ASA    Type of Anesthesia:  Local  WITH EPINEPHRINE     Device Information:  Clinic EP Physician:  Lewayne Bunting, MD   Device Type:  Defibrillator Manufacturer and Phone #:  [employer]: [phone] Pacemaker Dependent?:  No. Date of Last Device Check:  08/11/2022 Remote/  05/16/22 IN OFFICE  Normal Device Function?:  Yes.    Electrophysiologist's Recommendations: Procedure should not interfere with device function.  No device programming or magnet placement needed. Unless using electrical wave current equipment, if so, recommend use of magnet to disable tachy therapies during procedure.   Per Device Clinic Standing Orders, Kizzie Ide, RN  12:43 PM 09/03/2022

## 2022-09-03 NOTE — Telephone Encounter (Signed)
Clearance sent by device clinic 09/03/22.

## 2022-09-09 ENCOUNTER — Telehealth: Payer: Self-pay | Admitting: Internal Medicine

## 2022-09-09 NOTE — Telephone Encounter (Signed)
Patient is going to be out of the country starting [place]th for a month. Patient wants to speak with the nurse about his device for when he is gone. Please advise.

## 2022-09-09 NOTE — Progress Notes (Signed)
Remote ICD transmission.   

## 2022-09-11 NOTE — Telephone Encounter (Signed)
No answer no voice mail  

## 2022-09-11 NOTE — Telephone Encounter (Signed)
Pt came to the clinic and I let him know that if he is going to be gone for more than 7 days, he needs to take the monitor with him. Pt verbalized understanding.

## 2022-09-12 ENCOUNTER — Other Ambulatory Visit: Payer: Self-pay

## 2022-09-16 ENCOUNTER — Other Ambulatory Visit: Payer: Self-pay

## 2022-10-01 ENCOUNTER — Other Ambulatory Visit: Payer: Self-pay | Admitting: Family Medicine

## 2022-10-01 DIAGNOSIS — E118 Type 2 diabetes mellitus with unspecified complications: Secondary | ICD-10-CM

## 2022-10-31 ENCOUNTER — Telehealth: Payer: Self-pay | Admitting: Pharmacist

## 2022-10-31 NOTE — Telephone Encounter (Signed)
Submitted a Prior Authorization renewal request to [place] [employer] for DUPIXENT via CoverMyMeds. Will update once we receive a response.  Key: [id number]  Chesley Mires, PharmD, MPH, BCPS, CPP Clinical Pharmacist (Rheumatology and Pulmonology)

## 2022-10-31 NOTE — Telephone Encounter (Signed)
Received notification from [facility] regarding a prior authorization for DUPIXENT. Authorization has been APPROVED from 10/31/2022 to 04/29/2023. Approval letter sent to scan center.  Authorization # [id number]

## 2022-11-04 ENCOUNTER — Ambulatory Visit: Payer: Medicaid Other

## 2022-11-04 ENCOUNTER — Other Ambulatory Visit (HOSPITAL_COMMUNITY): Payer: Self-pay

## 2022-11-04 DIAGNOSIS — I255 Ischemic cardiomyopathy: Secondary | ICD-10-CM

## 2022-11-06 LAB — CUP PACEART REMOTE DEVICE CHECK
Battery Remaining Longevity: 156 mo
Battery Remaining Percentage: 100 %
Brady Statistic RV Percent Paced: 0 %
Date Time Interrogation Session: 20240716042200
HighPow Impedance: 79 Ohm
Implantable Lead Connection Status: 753985
Implantable Lead Implant Date: 20221017
Implantable Lead Location: 753860
Implantable Lead Model: 138
Implantable Lead Serial Number: 304145
Implantable Pulse Generator Implant Date: 20221017
Lead Channel Impedance Value: 437 Ohm
Lead Channel Setting Pacing Amplitude: 2.5 V
Lead Channel Setting Pacing Pulse Width: 0.4 ms
Lead Channel Setting Sensing Sensitivity: 0.5 mV
Pulse Gen Serial Number: 213143

## 2022-11-07 ENCOUNTER — Other Ambulatory Visit (HOSPITAL_COMMUNITY): Payer: Self-pay

## 2022-11-09 ENCOUNTER — Encounter (HOSPITAL_COMMUNITY): Payer: Self-pay

## 2022-11-09 ENCOUNTER — Other Ambulatory Visit: Payer: Self-pay

## 2022-11-09 ENCOUNTER — Emergency Department (HOSPITAL_COMMUNITY)
Admission: EM | Admit: 2022-11-09 | Discharge: 2022-11-09 | Disposition: A | Discharge status: Home / Self Care | Payer: Medicaid Other | Attending: Emergency Medicine | Admitting: Emergency Medicine

## 2022-11-09 DIAGNOSIS — J449 Chronic obstructive pulmonary disease, unspecified: Secondary | ICD-10-CM | POA: Insufficient documentation

## 2022-11-09 DIAGNOSIS — J45909 Unspecified asthma, uncomplicated: Secondary | ICD-10-CM | POA: Insufficient documentation

## 2022-11-09 DIAGNOSIS — E119 Type 2 diabetes mellitus without complications: Secondary | ICD-10-CM | POA: Insufficient documentation

## 2022-11-09 DIAGNOSIS — Z79899 Other long term (current) drug therapy: Secondary | ICD-10-CM | POA: Insufficient documentation

## 2022-11-09 DIAGNOSIS — Z95810 Presence of automatic (implantable) cardiac defibrillator: Secondary | ICD-10-CM | POA: Diagnosis not present

## 2022-11-09 DIAGNOSIS — T18128A Food in esophagus causing other injury, initial encounter: Secondary | ICD-10-CM | POA: Diagnosis not present

## 2022-11-09 DIAGNOSIS — Z7951 Long term (current) use of inhaled steroids: Secondary | ICD-10-CM | POA: Insufficient documentation

## 2022-11-09 DIAGNOSIS — X58XXXA Exposure to other specified factors, initial encounter: Secondary | ICD-10-CM | POA: Insufficient documentation

## 2022-11-09 DIAGNOSIS — Z7982 Long term (current) use of aspirin: Secondary | ICD-10-CM | POA: Insufficient documentation

## 2022-11-09 DIAGNOSIS — Z7984 Long term (current) use of oral hypoglycemic drugs: Secondary | ICD-10-CM | POA: Diagnosis not present

## 2022-11-09 DIAGNOSIS — I10 Essential (primary) hypertension: Secondary | ICD-10-CM | POA: Insufficient documentation

## 2022-11-09 DIAGNOSIS — W44F3XA Food entering into or through a natural orifice, initial encounter: Secondary | ICD-10-CM

## 2022-11-09 DIAGNOSIS — I2510 Atherosclerotic heart disease of native coronary artery without angina pectoris: Secondary | ICD-10-CM | POA: Insufficient documentation

## 2022-11-09 NOTE — Discharge Instructions (Signed)
Thank you for coming to [facility] Emergency Department. You were seen for an esophageal food impaction that resolved with coca cola. Please follow up with your gastroenterologist as needed. If this occurs again, you can try drinking carbonated beverage to try to help it to go down, or jumping up and down. If those things to do not work, please come back to the emergency department.   Do not hesitate to return to the ED or call 911 if you experience: -Worsening symptoms -Lightheadedness, passing out -Fevers/chills -Anything else that concerns you

## 2022-11-09 NOTE — ED Triage Notes (Signed)
Pt feels like a piece of meat is stuck in his throat. Happened at 9 pm. Has not been able to drink or eat anything since then.

## 2022-11-09 NOTE — ED Provider Notes (Signed)
[place] EMERGENCY DEPARTMENT AT [facility] Provider Note   CSN: [id number] Arrival date & time: 11/09/22  8657     History  Chief Complaint  Patient presents with   Foreign Body    [name] is a 62 y.o. male with CAD status post stent in 2008 and repeated 09/20/2020 on Plavix, also flagged for difficult airway, ischemic cardiomyopathy with chronic heart failure, status post ICD implantation 02/04/2021, LVEF at that time 30%, COPD, GERD and multiple others listed below who presents feeling like a piece of meat is stuck in his throat. Happened at 9 pm after eating steak. Has not been able to drink or eat anything since then or he vomits it.  No blood in his vomit.Marland Kitchen He is able to swallow his saliva but he cannot swallow anything more, if he does he vomits it back up. Feels the bolus sensation in his throat. This has happened 3 times before and he has required endoscopy to remove the food bolus.  He has also had esophageal dilations before.  He does not eat meat for that reason typically but he was at a party last night so ate meat.  He denies any chest pain, shortness of breath, difficulty breathing, nausea or abdominal pain.  Otherwise was in his normal state of health.  Per chart review had esophageal obstruction last in October 2022 requiring endoscopy, and in 2019.   Past Medical History:  Diagnosis Date   Asthma    CAD (coronary artery disease)    a. CAD s/p anterior MI in 2008 treated with DES to the LAD.   Cataract 2018   both eyes   Cellulitis of leg, right 10/2014   COPD (chronic obstructive pulmonary disease) (HCC)    Dental caries    Diabetes mellitus    TYPE 2   GERD (gastroesophageal reflux disease)    Hyperlipidemia    Hypertension 2017   Ischemic cardiomyopathy    Leukocytosis    noted on labs   Microcytic anemia    Myocardial infarction [facility]) 2008   Reactive airway disease    Thyroid nodule    biopsy negative 2006       Home  Medications Prior to Admission medications   Medication Sig Start Date End Date Taking? Authorizing Provider  albuterol (PROAIR HFA) 108 (90 Base) MCG/ACT inhaler INHALE 2 PUFFS INTO THE LUNGS EVERY 4 HOURS AS NEEDED FOR WHEEZING OR SHORTNESS OF BREATH (PLAN B). 03/29/21   Nyoka Cowden, MD  albuterol (PROVENTIL) (2.5 MG/3ML) 0.083% nebulizer solution Take 3 mLs (2.5 mg total) by nebulization every 6 (six) hours as needed for wheezing or shortness of breath. 03/26/21   Merrilee Jansky, MD  aspirin EC 81 MG tablet Take 1 tablet (81 mg total) by mouth daily. 11/14/20   Kathleene Hazel, MD  atorvastatin (LIPITOR) 80 MG tablet Take 1 tablet (80 mg total) by mouth daily. 08/28/22   Maury Dus, MD  atovaquone-proguanil (MALARONE) 250-100 MG TABS tablet Take 1 tablet by mouth daily. 08/28/22   Maury Dus, MD  bisoprolol (ZEBETA) 5 MG tablet Take 0.5 tablets (2.5 mg total) by mouth daily. Please call our office to schedule an overdue appointment with Dr. Clifton James before anymore refills. [phone]. 08/12/22   Kathleene Hazel, MD  calcium-vitamin D (OSCAL WITH D) 250-125 MG-UNIT tablet Take 1 tablet by mouth 2 (two) times daily.    [provider]  Dupilumab (DUPIXENT) 300 MG/2ML SOPN Inject 300 mg into the skin every  14 (fourteen) days. 08/08/22   Nyoka Cowden, MD  FARXIGA 10 MG TABS tablet TAKE 1 TABLET (10MG  TOTAL) BY MOUTH DAILY. 10/02/22   Maury Dus, MD  ferrous sulfate 325 (65 FE) MG tablet Take 325 mg by mouth daily with breakfast.    [provider]  fluticasone (FLONASE) 50 MCG/ACT nasal spray Place 1 spray into both nostrils daily. 02/28/22   Cobb, Ruby Cola, NP  Fluticasone-Umeclidin-Vilant (TRELEGY ELLIPTA) 100-62.5-25 MCG/ACT AEPB Inhale 1 puff into the lungs daily. 06/30/22   Nyoka Cowden, MD  Fluticasone-Umeclidin-Vilant (TRELEGY ELLIPTA) 200-62.5-25 MCG/ACT AEPB Inhale 1 puff into the lungs daily. 06/02/22   Martina Sinner, MD  loratadine  (CLARITIN) 10 MG tablet Take 1 tablet (10 mg total) by mouth daily. Patient not taking: Reported on 07/28/2022 04/04/22   Glenford Bayley, NP  metFORMIN (GLUCOPHAGE) 1000 MG tablet Take 1 tablet (1,000 mg total) by mouth 2 (two) times daily with a meal. 08/28/22   Maury Dus, MD  Multiple Vitamin (MULTIVITAMIN WITH MINERALS) TABS tablet Take 1 tablet by mouth daily.    [provider]  nitroGLYCERIN (NITROSTAT) 0.4 MG SL tablet Place 1 tablet (0.4 mg total) under the tongue every 5 (five) minutes x 3 doses as needed for chest pain. 09/22/20   Corrin Parker, PA-C  omeprazole (PRILOSEC OTC) 20 MG tablet Take 20 mg by mouth daily.    [provider]  predniSONE (DELTASONE) 10 MG tablet Take 10 mg by mouth daily with breakfast.    [provider]  valsartan (DIOVAN) 40 MG tablet Take 1 tablet (40 mg total) by mouth daily. 08/12/22   Kathleene Hazel, MD      Allergies    Doxycycline    Review of Systems   Review of Systems A 10 point review of systems was performed and is negative unless otherwise reported in HPI.  Physical Exam Updated Vital Signs BP (!) 142/79 (BP Location: Right Arm)   Pulse 88   Temp 98.2 F (36.8 C)   Resp 18   Ht 5\' 8"  (1.727 m)   Wt 68.9 kg   SpO2 96%   BMI 23.11 kg/m  Physical Exam General: Normal appearing male, lying in bed.  HEENT: Sclera anicteric, MMM, trachea midline. Clear oropharynx. Cardiology: RRR, no murmurs/rubs/gallops.  Resp: Normal respiratory rate and effort. CTAB, no wheezes, rhonchi, crackles.  Abd: Soft, non-tender, non-distended. No rebound tenderness or guarding.  GU: Deferred. MSK: No peripheral edema or signs of trauma. Skin: warm, dry. Neuro: A&Ox4, CNs II-XII grossly intact. MAEs. Psych: Normal mood and affect.   ED Results / Procedures / Treatments   Labs (all labs ordered are listed, but only abnormal results are displayed) Labs Reviewed - No data to  display  EKG None  Radiology No results found.  Procedures Procedures    Medications Ordered in ED Medications - No data to display  ED Course/ Medical Decision Making/ A&P                          Medical Decision Making   This patient presents to the ED for concern of esophageal food impaction, this involves an extensive number of treatment options, and is a complaint that carries with it a high risk of complications and morbidity.    MDM:    Patient with concern for esophageal food impaction.  He has no chest pain and the globus sensation is in his throat.  He  is not in any pain just uncomfortable.  I have overall low concern for esophageal rupture or laceration, no blood in any of his vomit.  He is able to swallow his saliva which indicates likely a partial obstruction, also supported by the fact that he has had this since 9 PM last night.  Patient is given a Coca[employer] and on reevaluation states he has had complete resolution of his symptoms.  He states that the food bolus has passed and he feels much better, back to normal.  I do not believe patient requires further workup, he is hemodynamically stable and very well-appearing.  I encouraged patient to follow-up with his gastroenterologist as needed, chew his food well, and gave him discharge instructions, return precautions, all questions answered to patient and his wife satisfaction.     Additional history obtained from chart review, wife at bedside.    Reevaluation: After the interventions noted above, I reevaluated the patient and found that they have :resolved  Social Determinants of Health: Lives independently  Disposition:  DC w/ discharge instructions/return precautions. All questions answered to patient's satisfaction.    Co morbidities that complicate the patient evaluation  Past Medical History:  Diagnosis Date   Asthma    CAD (coronary artery disease)    a. CAD s/p anterior MI in 2008 treated with DES to  the LAD.   Cataract 2018   both eyes   Cellulitis of leg, right 10/2014   COPD (chronic obstructive pulmonary disease) (HCC)    Dental caries    Diabetes mellitus    TYPE 2   GERD (gastroesophageal reflux disease)    Hyperlipidemia    Hypertension 2017   Ischemic cardiomyopathy    Leukocytosis    noted on labs   Microcytic anemia    Myocardial infarction [facility]) 2008   Reactive airway disease    Thyroid nodule    biopsy negative 2006     Medicines No orders of the defined types were placed in this encounter.   I have reviewed the patients home medicines and have made adjustments as needed  Problem List / ED Course: Problem List Items Addressed This Visit   None Visit Diagnoses     Food impaction of esophagus, initial encounter    -  Primary                   This note was created using dictation software, which may contain spelling or grammatical errors.    Loetta Rough, MD 11/09/22 [phone]

## 2022-11-10 ENCOUNTER — Other Ambulatory Visit (HOSPITAL_COMMUNITY): Payer: Self-pay

## 2022-11-10 ENCOUNTER — Encounter (HOSPITAL_COMMUNITY): Payer: Self-pay

## 2022-11-20 NOTE — Progress Notes (Signed)
Remote ICD transmission.   

## 2022-12-17 ENCOUNTER — Other Ambulatory Visit (HOSPITAL_COMMUNITY): Payer: Self-pay

## 2022-12-17 ENCOUNTER — Telehealth: Payer: Self-pay | Admitting: Primary Care

## 2022-12-17 ENCOUNTER — Other Ambulatory Visit: Payer: Self-pay

## 2022-12-17 DIAGNOSIS — J4489 Other specified chronic obstructive pulmonary disease: Secondary | ICD-10-CM

## 2022-12-17 MED ORDER — DUPIXENT 300 MG/2ML ~~LOC~~ SOAJ
300.0000 mg | SUBCUTANEOUS | 0 refills | Status: DC
Start: 2022-12-17 — End: 2023-03-04
  Filled 2022-12-17: qty 12, 84d supply, fill #0
  Filled 2023-01-01: qty 8, 56d supply, fill #0

## 2022-12-17 NOTE — Telephone Encounter (Signed)
Refill for Dupixent sent to [facility]. Patient scheduled for f/u appt with Buelah Manis, NP on 01/20/23  Chesley Mires, PharmD, MPH, BCPS, CPP Clinical Pharmacist (Rheumatology and Pulmonology)

## 2022-12-17 NOTE — Telephone Encounter (Signed)
PT calling for more Dupixant. Has 14 days left.  [facility] at [phone]

## 2022-12-17 NOTE — Telephone Encounter (Signed)
Cancel.

## 2022-12-17 NOTE — Telephone Encounter (Signed)
[facility]  Dupixant refill is needed.

## 2022-12-19 ENCOUNTER — Other Ambulatory Visit (HOSPITAL_COMMUNITY): Payer: Self-pay

## 2022-12-23 ENCOUNTER — Other Ambulatory Visit (HOSPITAL_COMMUNITY): Payer: Self-pay

## 2022-12-23 ENCOUNTER — Encounter (HOSPITAL_COMMUNITY): Payer: Self-pay

## 2023-01-01 ENCOUNTER — Other Ambulatory Visit (HOSPITAL_COMMUNITY): Payer: Self-pay

## 2023-01-02 ENCOUNTER — Other Ambulatory Visit (HOSPITAL_COMMUNITY): Payer: Self-pay

## 2023-01-02 ENCOUNTER — Other Ambulatory Visit: Payer: Self-pay

## 2023-01-05 ENCOUNTER — Other Ambulatory Visit (HOSPITAL_COMMUNITY): Payer: Self-pay

## 2023-01-09 ENCOUNTER — Other Ambulatory Visit: Payer: Self-pay

## 2023-01-20 ENCOUNTER — Ambulatory Visit: Payer: Medicaid Other | Admitting: Primary Care

## 2023-01-20 ENCOUNTER — Encounter: Payer: Self-pay | Admitting: Primary Care

## 2023-01-20 VITALS — BP 130/62 | HR 98 | Ht 68.0 in | Wt 150.0 lb

## 2023-01-20 DIAGNOSIS — J4489 Other specified chronic obstructive pulmonary disease: Secondary | ICD-10-CM

## 2023-01-20 MED ORDER — PREDNISONE 10 MG PO TABS: 10.0000 mg | ORAL_TABLET | Freq: Every day | ORAL | 5 refills | Status: DC

## 2023-01-20 MED ORDER — PROMETHAZINE-DM 6.25-15 MG/5ML PO SYRP: 5.0000 mL | ORAL_SOLUTION | Freq: Four times a day (QID) | ORAL | 0 refills | Status: AC | PRN

## 2023-01-20 NOTE — Progress Notes (Signed)
@Patient  ID: [name], male    DOB: [birth year], 62 y.o.   MRN: [id number]  Chief Complaint  Patient presents with   Cough    Present for three weeks, making throat sore.  Cough is producing white mucous.  Denies fever.    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 He is a patient of Dr. Thurston Hole and last seen in office 09/23/2021. Past medical history significant for CAD, HFrEF, cardiomyopathy s/p ICD, HTN, GERD, chronic sinusitis, HLD, recurrent otitis with perforated tympanic membrane.   Previous [facility] pulmonary encounter:  09/23/2021: Ov with Dr. Sherene Sires. Maintained on Trelegy and prednisone 10 mg daily. Doing well. Has not had to use albuterol recently. Advised to start tapering prednisone to target of 2.5 mg per day. Continue dupixent.   02/28/2022: Today-follow-up Patient presents today for follow-up.  He is also having some acute symptoms.  He has had increased nasal congestion and white to yellow drainage over the past month to month and a half.  He is also had some pus colored drainage coming from his right ear.  He has had issues in the past with recurrent otitis and was previously followed by Dr. Ezzard Standing but has not seen him in quite some time.  Fortunately, his breathing is stable.  He does not have any increased shortness of breath, wheezing or chest congestion.  Does have a minimal cough, which is unchanged from his baseline.  Denies any fevers, chills, headaches, sore throats, changes in hearing.  No viral testing or known sick exposures.  He continues on Trelegy daily.  He was only having to use his albuterol once a week but since he had increased nasal congestion, he has been using it once a day in the morning.  Not currently on any nasal sprays.  Dupixent injection is due today.  He tried to decrease prednisone to 5 mg.  Felt like his breathing got worse so he is back at 10 mg daily.   He tries at least once a month to start tapering.  04/04/2022  He is feeling better but still has nasal congestion, symptoms are worse in the morning. He has difficulty breathing through his nose at night. He is fine during the daytime. He still has a small amount of drainage from right ear. He has no ear pain. He uses saline nasal rinses 1-2 times a day. He is taking trelegy, prednisone 10mg  daily and dupixent as directed.   01/20/2023-Interim hx  Patient presents today for acute OV. He has had a dry cough for 3 weeks. Not currently taking anything over the counter for cough. No associated PND, reflux symptoms, shortness of breath, chest tightness or wheezing. He is getting up clear mucus mostly in the morning. He is taking Trelegy once daily and dupixent injections as directed. He ran our of prednisone 1 week ago.    Allergies  Allergen Reactions   Doxycycline Rash    Immunization History  Administered Date(s) Administered   Hepatitis A, Adult 03/21/2015   Hepatitis B, ADULT 03/21/2015   Influenza Split 03/21/2011, 12/21/2011, 01/19/2013, 01/20/2015   Influenza Whole 01/20/2008, 01/30/2009   Influenza,inj,Quad PF,6+ Mos 12/21/2015, 05/06/2016, 01/05/2018, 04/27/2019, 01/10/2020   MMR 03/21/2015   Meningococcal Mcv4o 05/06/2016   PFIZER Comirnaty(Gray Top)Covid-19 Tri-Sucrose Vaccine 08/07/2020   PFIZER(Purple Top)SARS-COV-2 Vaccination 01/10/2020, 02/07/2020   Pneumococcal Polysaccharide-23 01/30/2009, 08/14/2017   Td 05/27/2004   Tdap 02/27/2015  Past Medical History:  Diagnosis Date   Asthma    CAD (coronary artery disease)    a. CAD s/p anterior MI in 2008 treated with DES to the LAD.   Cataract 2018   both eyes   Cellulitis of leg, right 10/2014   COPD (chronic obstructive pulmonary disease) (HCC)    Dental caries    Diabetes mellitus    TYPE 2   GERD (gastroesophageal reflux disease)    Hyperlipidemia    Hypertension 2017   Ischemic cardiomyopathy     Leukocytosis    noted on labs   Microcytic anemia    Myocardial infarction [facility]) 2008   Reactive airway disease    Thyroid nodule    biopsy negative 2006    Tobacco History: Social History   Tobacco Use  Smoking Status Former   Current packs/day: 0.00   Average packs/day: 0.5 packs/day for 22.0 years (11.0 ttl pk-yrs)   Types: Cigarettes   Start date: 04/21/1984   Quit date: 04/21/2006   Years since quitting: 16.7  Smokeless Tobacco Never  Tobacco Comments   quit 10 years ago   Counseling given: Not Answered Tobacco comments: quit 10 years ago  Review of Systems  Review of Systems  Constitutional: Negative.   HENT: Negative.  Negative for postnasal drip.   Respiratory:  Positive for cough. Negative for chest tightness, shortness of breath and wheezing.   Cardiovascular: Negative.    Physical Exam  BP 130/62   Pulse 98   Ht 5\' 8"  (1.727 m)   Wt 150 lb (68 kg)   SpO2 96%   BMI 22.81 kg/m  Physical Exam Constitutional:      Appearance: Normal appearance.  HENT:     Head: Normocephalic and atraumatic.     Right Ear: Tympanic membrane normal.     Left Ear: Tympanic membrane normal.     Mouth/Throat:     Mouth: Mucous membranes are moist.     Pharynx: Oropharynx is clear.  Cardiovascular:     Rate and Rhythm: Normal rate and regular rhythm.  Pulmonary:     Effort: Pulmonary effort is normal.     Breath sounds: Normal breath sounds.     Comments: Isolated wheeze right lower lobe; otherwise clear  Musculoskeletal:     Cervical back: Normal range of motion and neck supple.  Skin:    General: Skin is warm and dry.  Neurological:     General: No focal deficit present.     Mental Status: He is alert and oriented to person, place, and time. Mental status is at baseline.  Psychiatric:        Mood and Affect: Mood normal.        Behavior: Behavior normal.        Thought Content: Thought content normal.        Judgment: Judgment normal.      Lab  Results:  CBC    Component Value Date/Time   WBC 15.7 (H) 01/08/2022 1238   WBC 15.7 (H) 02/11/2021 0851   RBC 6.34 (H) 01/08/2022 1238   RBC 5.95 (H) 02/11/2021 0851   HGB 15.4 01/08/2022 1238   HGB 13.3 07/05/2014 1431   HCT 48.1 01/08/2022 1238   HCT 40.1 07/05/2014 1431   PLT 239 01/08/2022 1238   MCV 76 (L) 01/08/2022 1238   MCV 72 (L) 07/05/2014 1431   MCH 24.3 (L) 01/08/2022 1238   MCH 23.7 (L) 02/11/2021 0851   MCHC 32.0 01/08/2022 1238  MCHC 30.9 02/11/2021 0851   RDW 14.1 01/08/2022 1238   RDW 14.4 07/05/2014 1431   LYMPHSABS 3.3 02/11/2021 0851   LYMPHSABS 1.5 02/01/2021 1405   LYMPHSABS 1.3 07/05/2014 1431   MONOABS 1.2 (H) 02/11/2021 0851   EOSABS 0.7 (H) 02/11/2021 0851   EOSABS 0.2 02/01/2021 1405   EOSABS 0.1 07/05/2014 1431   BASOSABS 0.1 02/11/2021 0851   BASOSABS 0.1 02/01/2021 1405   BASOSABS 0.1 07/05/2014 1431    BMET    Component Value Date/Time   NA 145 (H) 01/08/2022 1238   K 4.7 01/08/2022 1238   CL 102 01/08/2022 1238   CO2 19 (L) 01/08/2022 1238   GLUCOSE 159 (H) 01/08/2022 1238   GLUCOSE 97 02/11/2021 0851   BUN 17 01/08/2022 1238   CREATININE 1.01 01/08/2022 1238   CREATININE 0.98 09/17/2012 1602   CALCIUM 9.9 01/08/2022 1238   GFRNONAA >60 02/11/2021 0851   GFRAA 83 03/21/2020 1019    BNP No results found for: "BNP"  ProBNP    Component Value Date/Time   PROBNP 131.0 (H) 02/20/2007 0425    Imaging: No results found.   Assessment & Plan:   COPD with asthma (HCC) - Patient has had a dry cough for 3 weeks. No fevers, purulent mucus or shortness of breath. Maintained on Trelegy , Dupixent injections and prednisone 10mg  daily. He ran out of prednisone 1 week ago. Exam was benign today. He had mild isolated wheezing right lower lobe. Recommend he take promethazine-DM 5ml q6 hours for cough, increased prednisone 20mg  daily x 5 days and then resume 10mg  daily. Advised patient contact office in 1 week if symptoms do not  improve or worsen.   Sinusitis, chronic - No acute symptoms - Continue Claritin 10mg  daily, Flonase nasal spray prn    Glenford Bayley, NP 01/20/2023

## 2023-01-20 NOTE — Assessment & Plan Note (Addendum)
-   Patient has had a dry cough for 3 weeks. No fevers, purulent mucus or shortness of breath. Maintained on Trelegy , Dupixent injections and prednisone 10mg  daily. He ran out of prednisone 1 week ago. Exam was benign today. He had mild isolated wheezing right lower lobe. Recommend he take promethazine-DM 5ml q6 hours for cough, increased prednisone 20mg  daily x 5 days and then resume 10mg  daily. Advised patient contact office in 1 week if symptoms do not improve or worsen.

## 2023-01-20 NOTE — Patient Instructions (Addendum)
Recomemndations: Take promethazine DM for cough Increased prednisone 20mg  x 5 days; then resume 10mg  daily Continue Trelegy one puff daily  Continue Claritin 10mg  daily   Follow-up If not better in 1 week notify our office by mychart or call

## 2023-01-20 NOTE — Assessment & Plan Note (Signed)
-   No acute symptoms - Continue Claritin 10mg  daily, Flonase nasal spray prn

## 2023-02-03 ENCOUNTER — Ambulatory Visit: Payer: Medicaid Other

## 2023-02-26 ENCOUNTER — Other Ambulatory Visit: Payer: Self-pay

## 2023-03-02 ENCOUNTER — Other Ambulatory Visit (HOSPITAL_COMMUNITY): Payer: Self-pay

## 2023-03-02 ENCOUNTER — Encounter (HOSPITAL_COMMUNITY): Payer: Self-pay

## 2023-03-04 ENCOUNTER — Other Ambulatory Visit: Payer: Self-pay | Admitting: Internal Medicine

## 2023-03-04 ENCOUNTER — Other Ambulatory Visit (HOSPITAL_COMMUNITY): Payer: Self-pay

## 2023-03-04 ENCOUNTER — Other Ambulatory Visit (HOSPITAL_COMMUNITY): Payer: Self-pay | Admitting: Pharmacy Technician

## 2023-03-04 ENCOUNTER — Other Ambulatory Visit: Payer: Self-pay

## 2023-03-04 DIAGNOSIS — J4489 Other specified chronic obstructive pulmonary disease: Secondary | ICD-10-CM

## 2023-03-04 MED ORDER — DUPIXENT 300 MG/2ML ~~LOC~~ SOAJ
300.0000 mg | SUBCUTANEOUS | 1 refills | Status: DC
Start: 2023-03-04 — End: 2023-10-12
  Filled 2023-03-04: qty 4, 28d supply, fill #0
  Filled 2023-04-15 – 2023-05-01 (×2): qty 4, 28d supply, fill #1
  Filled 2023-06-09 – 2023-06-10 (×3): qty 4, 28d supply, fill #2
  Filled 2023-07-01: qty 4, 28d supply, fill #3
  Filled 2023-08-20: qty 4, 28d supply, fill #4
  Filled 2023-09-07 – 2023-09-09 (×2): qty 4, 28d supply, fill #5

## 2023-03-04 NOTE — Progress Notes (Signed)
Specialty Pharmacy Refill Coordination Note  [name] is a 62 y.o. male contacted today regarding refills of specialty medication(s) No medications found.   Patient requested Delivery   Delivery date: 03/17/23   Verified address: [name]   Medication will be filled on 03/16/23.  Refill Request sent to MD patient wants 3 months; only 1 month remain on rx.  Call if any delays. Change out reach if Md sends in for 3 months

## 2023-03-04 NOTE — Telephone Encounter (Signed)
Refill sent for DUPIXENT to [facility]: [phone]   Dose: 300mg  SQ every 2 weeks  Last OV: 01/20/2023 Provider: Dr. Dayna Ramus, PharmD, MPH, BCPS Clinical Pharmacist (Rheumatology and Pulmonology)

## 2023-03-16 ENCOUNTER — Other Ambulatory Visit: Payer: Self-pay

## 2023-03-16 ENCOUNTER — Other Ambulatory Visit (HOSPITAL_COMMUNITY): Payer: Self-pay

## 2023-03-23 ENCOUNTER — Other Ambulatory Visit (HOSPITAL_COMMUNITY): Payer: Self-pay

## 2023-04-02 ENCOUNTER — Other Ambulatory Visit (HOSPITAL_COMMUNITY): Payer: Self-pay

## 2023-04-03 ENCOUNTER — Telehealth: Payer: Self-pay | Admitting: Pharmacist

## 2023-04-03 NOTE — Telephone Encounter (Signed)
Received notification from [employer] regarding a prior authorization for DUPIXENT. Authorization has been APPROVED from 04/03/23 to 09/30/2023. Approval letter sent to scan center.  Patient can continue to fill through [facility]: [phone]   Authorization # [id number]  Chesley Mires, PharmD, MPH, BCPS, CPP Clinical Pharmacist (Rheumatology and Pulmonology)

## 2023-04-03 NOTE — Telephone Encounter (Signed)
Submitted a Prior Authorization RENEWAL request to [facility] for DUPIXENT via CoverMyMeds. Will update once we receive a response.  Key: [id number]   Last and only visit in 2024 was acute visit  Chesley Mires, PharmD, MPH, BCPS, CPP Clinical Pharmacist (Rheumatology and Pulmonology)

## 2023-04-07 ENCOUNTER — Other Ambulatory Visit (HOSPITAL_COMMUNITY): Payer: Self-pay

## 2023-04-07 ENCOUNTER — Encounter (HOSPITAL_COMMUNITY): Payer: Self-pay

## 2023-04-09 ENCOUNTER — Other Ambulatory Visit: Payer: Self-pay

## 2023-04-16 ENCOUNTER — Other Ambulatory Visit: Payer: Self-pay

## 2023-04-24 ENCOUNTER — Other Ambulatory Visit: Payer: Self-pay

## 2023-04-24 DIAGNOSIS — E118 Type 2 diabetes mellitus with unspecified complications: Secondary | ICD-10-CM

## 2023-04-25 MED ORDER — METFORMIN HCL 1000 MG PO TABS
1000.0000 mg | ORAL_TABLET | Freq: Two times a day (BID) | ORAL | 1 refills | Status: DC
Start: 2023-04-25 — End: 2023-11-30

## 2023-05-01 ENCOUNTER — Other Ambulatory Visit: Payer: Self-pay

## 2023-05-04 ENCOUNTER — Other Ambulatory Visit: Payer: Self-pay

## 2023-05-04 ENCOUNTER — Other Ambulatory Visit: Payer: Self-pay | Admitting: Pulmonary Disease

## 2023-05-04 ENCOUNTER — Other Ambulatory Visit (HOSPITAL_COMMUNITY): Payer: Self-pay

## 2023-05-04 DIAGNOSIS — J4489 Other specified chronic obstructive pulmonary disease: Secondary | ICD-10-CM

## 2023-05-05 ENCOUNTER — Ambulatory Visit (INDEPENDENT_AMBULATORY_CARE_PROVIDER_SITE_OTHER): Payer: Medicaid Other

## 2023-05-05 DIAGNOSIS — I255 Ischemic cardiomyopathy: Secondary | ICD-10-CM

## 2023-05-05 LAB — CUP PACEART REMOTE DEVICE CHECK
Battery Remaining Longevity: 138 mo
Battery Remaining Percentage: 98 %
Brady Statistic RV Percent Paced: 0 %
Date Time Interrogation Session: 20250114050600
HighPow Impedance: 83 Ohm
Implantable Lead Connection Status: 753985
Implantable Lead Implant Date: 20221017
Implantable Lead Location: 753860
Implantable Lead Model: 138
Implantable Lead Serial Number: 304145
Implantable Pulse Generator Implant Date: 20221017
Lead Channel Impedance Value: 461 Ohm
Lead Channel Setting Pacing Amplitude: 2.5 V
Lead Channel Setting Pacing Pulse Width: 0.4 ms
Lead Channel Setting Sensing Sensitivity: 0.5 mV
Pulse Gen Serial Number: 213143

## 2023-05-07 ENCOUNTER — Ambulatory Visit: Payer: Medicaid Other | Admitting: Student

## 2023-05-08 ENCOUNTER — Other Ambulatory Visit: Payer: Self-pay

## 2023-05-14 ENCOUNTER — Ambulatory Visit: Payer: Medicaid Other | Admitting: Student

## 2023-05-22 ENCOUNTER — Encounter: Payer: Self-pay | Admitting: Student

## 2023-05-22 ENCOUNTER — Ambulatory Visit (INDEPENDENT_AMBULATORY_CARE_PROVIDER_SITE_OTHER): Payer: Medicaid Other | Admitting: Student

## 2023-05-22 VITALS — BP 118/64 | HR 99 | Wt 150.0 lb

## 2023-05-22 DIAGNOSIS — Z1211 Encounter for screening for malignant neoplasm of colon: Secondary | ICD-10-CM | POA: Diagnosis not present

## 2023-05-22 DIAGNOSIS — E119 Type 2 diabetes mellitus without complications: Secondary | ICD-10-CM

## 2023-05-22 DIAGNOSIS — E118 Type 2 diabetes mellitus with unspecified complications: Secondary | ICD-10-CM

## 2023-05-22 DIAGNOSIS — M25512 Pain in left shoulder: Secondary | ICD-10-CM

## 2023-05-22 DIAGNOSIS — Z23 Encounter for immunization: Secondary | ICD-10-CM | POA: Diagnosis not present

## 2023-05-22 LAB — POCT GLYCOSYLATED HEMOGLOBIN (HGB A1C): HbA1c, POC (controlled diabetic range): 8.1 % — AB (ref 0.0–7.0)

## 2023-05-22 MED ORDER — ATORVASTATIN CALCIUM 80 MG PO TABS: 80.0000 mg | ORAL_TABLET | Freq: Every day | ORAL | 3 refills | Status: AC

## 2023-05-22 MED ORDER — DAPAGLIFLOZIN PROPANEDIOL 10 MG PO TABS: 10.0000 mg | ORAL_TABLET | Freq: Every day | ORAL | 3 refills | Status: AC

## 2023-05-22 NOTE — Patient Instructions (Addendum)
For your shoulder, let's give it time. I don't think anything is irreparably torn or broken. We could consider a steroid injection but I also appreciate you not wanting that at this time. We may consider PT in the future if you don't bounce back.  We are checking some diabetes labs today. I  will call you if we need to make any changes based on results. I am refilling your Marcelline Deist and Lipitor. Please see your eye doctor and have them fax [place] their notes. [phone].  The GI doctors will call you to schedule a colonoscopy.   Eliezer Mccoy, MD

## 2023-05-23 NOTE — Progress Notes (Cosign Needed)
    SUBJECTIVE:   CHIEF COMPLAINT / HPI:   L Shoulder Pain Pain in his left shoulder since he was helping a neighbor move a box 3 weeks ago.  Tells me that immediately after this event he had severe pain of his trapezius and into his neck and extending all the way down to about the middle of his arm.  He could not lie on that side.  It is slowly getting better with time but given that it has been 3 weeks and he still having pain he comes in today for further assessment.3  DM2 Requesting refills of his Marcelline Deist and Lipitor.  Does not really check his sugars at home.  Last A1c was 7.3% but this was 9 months ago.  His only medications at this time are metformin 1000 mg twice daily, and Farxiga 10 mg daily.  Of note, he is on chronic prednisone therapy, takes 10 mg daily as prescribed by his pulmonologist for COPD-asthma overlap.   OBJECTIVE:   BP 118/64   Pulse 99   Wt 150 lb (68 kg)   SpO2 99%   BMI 22.81 kg/m   Gen: Well-appearing and NAD  HEENT: normocephalic, atraumatic, EOM grossly intact, oral mucosa moist, neck supple Respiratory: normal respiratory effort GI: non-distended MSK: There is tenderness along the posterior aspect of the left shoulder extending up into the trapezius and down into the lateral deltoid.  He has full range of motion about the joint, though does have mild pain with abduction.  + painful arc, - Hawkins, - Empty Can, - Lift off test.   ASSESSMENT/PLAN:   Assessment & Plan Acute pain of left shoulder Pain seems to be improving over the past 3 weeks.  No evidence of full-thickness rotator cuff tear on my exam today.  Suspect this may have been a muscle strain versus some component of age-related rotator cuff arthropathy/impingement.  Discussed options for trial of steroid injection today, versus physical therapy, versus watchful waiting.  He opts to watch this for the next 3 weeks or so and will return if it is still bothering him at that point and consider  steroid injection at that time. -Relative rest, conservative care Diabetes mellitus without complication (HCC) Tolerating current regimen of metformin and farxiga without issue.  - A1c, BMP, UACR today - Refilled Farxiga and Lipitor - Is on a statin (Atorva 80mg )  Healthcare Maintenance -Flu shot today -Referral made to GI for colonoscopy   J Dorothyann Gibbs, MD [facility]

## 2023-05-24 LAB — MICROALBUMIN / CREATININE URINE RATIO

## 2023-05-25 ENCOUNTER — Telehealth: Payer: Self-pay

## 2023-05-25 LAB — BASIC METABOLIC PANEL
BUN/Creatinine Ratio: 12 (ref 10–24)
BUN: 12 mg/dL (ref 8–27)
CO2: 24 mmol/L (ref 20–29)
Calcium: 9.4 mg/dL (ref 8.6–10.2)
Chloride: 101 mmol/L (ref 96–106)
Creatinine, Ser: 1.01 mg/dL (ref 0.76–1.27)
Glucose: 113 mg/dL — ABNORMAL HIGH (ref 70–99)
Potassium: 4.3 mmol/L (ref 3.5–5.2)
Sodium: 139 mmol/L (ref 134–144)
eGFR: 83 mL/min/{1.73_m2} (ref >59)

## 2023-05-25 LAB — MICROALBUMIN / CREATININE URINE RATIO

## 2023-05-25 NOTE — Telephone Encounter (Signed)
Pharmacy Patient Advocate Encounter   Received notification from CoverMyMeds that prior authorization for [facility] is required/requested.   Insurance verification completed.   The patient is insured through [facility] .   Per test claim: PA required; PA submitted to above mentioned insurance via CoverMyMeds Key/confirmation #/EOC [id number]. Status is pending

## 2023-05-27 NOTE — Telephone Encounter (Signed)
Pharmacy Patient Advocate Encounter  Received notification from [facility] that Prior Authorization for [name] has been APPROVED from 05/25/23 to 05/24/24   PA #/Case ID/Reference #: [id number]

## 2023-06-03 ENCOUNTER — Other Ambulatory Visit (HOSPITAL_COMMUNITY): Payer: Self-pay

## 2023-06-03 ENCOUNTER — Telehealth: Payer: Self-pay

## 2023-06-03 NOTE — Telephone Encounter (Signed)
Pharmacy Patient Advocate Encounter   Received notification from CoverMyMeds that prior authorization for Trelegy Ellipta 200-62.5-25MCG/ACT aerosol powder  is required/requested.   Insurance verification completed.   The patient is insured through [facility] .   Per test claim: Refill too soon. PA is not needed at this time. Medication was filled 02/09. Next eligible fill date is 03/04.

## 2023-06-09 ENCOUNTER — Other Ambulatory Visit: Payer: Self-pay

## 2023-06-10 ENCOUNTER — Other Ambulatory Visit: Payer: Self-pay

## 2023-06-10 ENCOUNTER — Other Ambulatory Visit (HOSPITAL_COMMUNITY): Payer: Self-pay

## 2023-06-10 NOTE — Progress Notes (Addendum)
Specialty Pharmacy Refill Coordination Note  [name] is a 63 y.o. male contacted today regarding refills of specialty medication(s) Dupilumab (Dupixent)   Patient requested [name] at [facility] at [place] date: 06/10/23   Medication will be filled on 06/10/23.    Filled through outpatient by mistake for January fill. Will update patient of new pick up date of 06/11/23 pending weather conditions.

## 2023-06-17 NOTE — Progress Notes (Signed)
 Remote ICD transmission.

## 2023-06-19 ENCOUNTER — Telehealth: Payer: Self-pay

## 2023-06-19 NOTE — Telephone Encounter (Signed)
*  Pulm  *renewal request   Pharmacy Patient Advocate Encounter  Received notification from [facility] that Prior Authorization for Trelegy Ellipta 200-62.5-25MCG/ACT aerosol powder  has been CANCELLED due to : Available without authorization.    PA #/Case ID/Reference #: [id number]

## 2023-07-01 ENCOUNTER — Other Ambulatory Visit: Payer: Self-pay

## 2023-07-01 NOTE — Progress Notes (Signed)
 Specialty Pharmacy Refill Coordination Note  [name] is a 63 y.o. male contacted today regarding refills of specialty medication(s) Dupilumab (Dupixent)   Patient requested Daryll Drown at [facility] at [place] date: 07/09/23   Medication will be filled on 07/08/23.

## 2023-07-01 NOTE — Progress Notes (Signed)
 Specialty Pharmacy Ongoing Clinical Assessment Note  [name] is a 63 y.o. male who is being followed by the specialty pharmacy service for RxSp Asthma/COPD   Patient's specialty medication(s) reviewed today: Dupilumab (Dupixent)   Missed doses in the last 4 weeks: 0   Patient/Caregiver did not have any additional questions or concerns.   Therapeutic benefit summary: Patient is achieving benefit   Adverse events/side effects summary: No adverse events/side effects   Patient's therapy is appropriate to: Continue    Goals Addressed             This Visit's Progress    Reduce disease symptoms including coughing and shortness of breath       Patient is on track. Patient will maintain adherence         Follow up:  6 months  Otto Herb Specialty Pharmacist

## 2023-07-08 ENCOUNTER — Other Ambulatory Visit: Payer: Self-pay

## 2023-07-11 ENCOUNTER — Other Ambulatory Visit (HOSPITAL_COMMUNITY): Payer: Self-pay

## 2023-07-14 ENCOUNTER — Other Ambulatory Visit (HOSPITAL_COMMUNITY): Payer: Self-pay

## 2023-08-04 ENCOUNTER — Ambulatory Visit: Payer: Self-pay

## 2023-08-04 DIAGNOSIS — I255 Ischemic cardiomyopathy: Secondary | ICD-10-CM | POA: Diagnosis not present

## 2023-08-05 LAB — CUP PACEART REMOTE DEVICE CHECK
Battery Remaining Longevity: 144 mo
Battery Remaining Percentage: 100 %
Brady Statistic RV Percent Paced: 0 %
Date Time Interrogation Session: 20250415070800
HighPow Impedance: 88 Ohm
Implantable Lead Connection Status: 753985
Implantable Lead Implant Date: 20221017
Implantable Lead Location: 753860
Implantable Lead Model: 138
Implantable Lead Serial Number: 304145
Implantable Pulse Generator Implant Date: 20221017
Lead Channel Impedance Value: 441 Ohm
Lead Channel Setting Pacing Amplitude: 2.5 V
Lead Channel Setting Pacing Pulse Width: 0.4 ms
Lead Channel Setting Sensing Sensitivity: 0.5 mV
Pulse Gen Serial Number: 213143

## 2023-08-07 ENCOUNTER — Encounter: Payer: Self-pay | Admitting: Internal Medicine

## 2023-08-10 ENCOUNTER — Other Ambulatory Visit: Payer: Self-pay

## 2023-08-12 ENCOUNTER — Other Ambulatory Visit: Payer: Self-pay

## 2023-08-13 ENCOUNTER — Other Ambulatory Visit: Payer: Self-pay

## 2023-08-13 ENCOUNTER — Other Ambulatory Visit: Payer: Self-pay | Admitting: Cardiovascular Disease

## 2023-08-13 DIAGNOSIS — I10 Essential (primary) hypertension: Secondary | ICD-10-CM

## 2023-08-13 DIAGNOSIS — I5020 Unspecified systolic (congestive) heart failure: Secondary | ICD-10-CM

## 2023-08-13 MED ORDER — VALSARTAN 40 MG PO TABS
40.0000 mg | ORAL_TABLET | Freq: Every day | ORAL | 0 refills | Status: DC
  Filled 2023-08-13: qty 30, 30d supply, fill #0

## 2023-08-17 ENCOUNTER — Other Ambulatory Visit: Payer: Self-pay

## 2023-08-20 ENCOUNTER — Other Ambulatory Visit: Payer: Self-pay

## 2023-08-20 NOTE — Progress Notes (Signed)
 Specialty Pharmacy Refill Coordination Note  [name] is a 63 y.o. male contacted today regarding refills of specialty medication(s) Dupilumab  (Dupixent )   Patient requested [name] at [facility] at [place] date: 08/21/23   Medication will be filled on 08/20/23.

## 2023-09-02 ENCOUNTER — Telehealth: Payer: Self-pay

## 2023-09-02 NOTE — Telephone Encounter (Addendum)
 Received PA renewal request from Cover My Meds for Dupixent . Patient overdue for f/u appt  Prior authorization expires on 09/30/2023  CMM key: [id number]  [name], PharmD, MPH, BCPS, CPP Clinical Pharmacist (Rheumatology and Pulmonology)

## 2023-09-04 NOTE — Telephone Encounter (Signed)
 Appt scheduled for 11/11/23

## 2023-09-07 ENCOUNTER — Other Ambulatory Visit: Payer: Self-pay

## 2023-09-09 ENCOUNTER — Other Ambulatory Visit: Payer: Self-pay | Admitting: Pharmacy Technician

## 2023-09-09 ENCOUNTER — Other Ambulatory Visit: Payer: Self-pay

## 2023-09-09 NOTE — Progress Notes (Signed)
 Specialty Pharmacy Refill Coordination Note  [name] is a 63 y.o. male contacted today regarding refills of specialty medication(s) Dupilumab  (Dupixent )   Patient requested [name] at [facility] at [place] date: 09/15/23   Medication will be filled on 09/11/23.

## 2023-09-11 ENCOUNTER — Other Ambulatory Visit: Payer: Self-pay

## 2023-09-18 NOTE — Addendum Note (Signed)
 Addended by: Lott Rouleau A on: 09/18/2023 04:18 PM   Modules accepted: Orders

## 2023-09-18 NOTE — Progress Notes (Signed)
 Remote ICD transmission.

## 2023-09-29 ENCOUNTER — Encounter: Payer: Self-pay | Admitting: *Deleted

## 2023-09-30 ENCOUNTER — Other Ambulatory Visit: Payer: Self-pay

## 2023-10-06 ENCOUNTER — Other Ambulatory Visit: Payer: Self-pay

## 2023-10-06 ENCOUNTER — Telehealth: Payer: Self-pay | Admitting: Cardiovascular Disease

## 2023-10-06 ENCOUNTER — Other Ambulatory Visit: Payer: Self-pay | Admitting: Student

## 2023-10-06 DIAGNOSIS — I10 Essential (primary) hypertension: Secondary | ICD-10-CM

## 2023-10-06 DIAGNOSIS — I5020 Unspecified systolic (congestive) heart failure: Secondary | ICD-10-CM

## 2023-10-06 NOTE — Telephone Encounter (Signed)
*  STAT* If patient is at the pharmacy, call can be transferred to refill team.   1. Which medications need to be refilled? (please list name of each medication and dose if known) atorvastatin  (LIPITOR ) 80 MG tablet   2. Which pharmacy/location (including street and city if l [employer] [facility] - [place], [place] - [address] Phone: [phone]  Fax: [phone]    [facility]) is medication to be sent to?  3. Do they need a 30 day or 90 day supply? 90

## 2023-10-06 NOTE — Telephone Encounter (Signed)
 RX was already sent in for a year from PCP. Verified with the Pharmacy. Called Pt and he verbalized understanding.

## 2023-10-07 ENCOUNTER — Other Ambulatory Visit: Payer: Self-pay

## 2023-10-07 ENCOUNTER — Other Ambulatory Visit: Payer: Self-pay | Admitting: *Deleted

## 2023-10-07 DIAGNOSIS — Z7184 Encounter for health counseling related to travel: Secondary | ICD-10-CM

## 2023-10-07 MED ORDER — VALSARTAN 40 MG PO TABS
40.0000 mg | ORAL_TABLET | Freq: Every day | ORAL | 0 refills | Status: DC
  Filled 2023-10-07: qty 30, 30d supply, fill #0

## 2023-10-08 ENCOUNTER — Other Ambulatory Visit: Payer: Self-pay

## 2023-10-09 ENCOUNTER — Encounter (HOSPITAL_BASED_OUTPATIENT_CLINIC_OR_DEPARTMENT_OTHER): Payer: Self-pay

## 2023-10-11 NOTE — Progress Notes (Unsigned)
 Cardiology Office Note    Date:  10/12/2023  ID:  Osmin, Welz [birth year], MRN [id number] PCP:  Marlee Lynwood NOVAK, MD  Cardiologist:  Lonni Cash, MD  Electrophysiologist:  Danelle Birmingham, MD   Chief Complaint: Follow up for CAD and HFrEF   History of Present Illness: SABRA    [name] is a 63 y.o. male with visit-pertinent history of CAD s/p anterior MI in 2008 treated with DES to LAD, microcytic anemia, HFrEF, ICM, asthma, hyperlipidemia, hypertension, COPD, VT s/p ICD in 01/2021.  Mr. Chalker hsa been followed by Dr. Cash since 2012 for management of CAD.  In 2016 echo indicated EF of 35 to 40% with akinesis of LV thrombus and aneurysm forming with G1 DD.  GDMT was optimized and 2D echo completed in 2020 showed reduced EF of 25 to 30%.  He presented the ED on 09/20/2020 for complaints of chest pain that reminded him of his MI in 2008.  He also had complaints of palpitations with lightheadedness and EMS was contacted and patient was found to be in wide-complex tachycardia, he was given amiodarone  while en route to the hospital.  When he arrived he was unstable and blood pressures were in the 70s requiring DCCV at 100 J and return to sinus rhythm.  LHC was performed and showed widely patent LAD with minimal stenosis in proximal RCA that was treated with DES x 1.  EP consult was placed, patient was evaluated by Dr. Birmingham for VT and ICM.  Echo was completed and revealed EF 30 to 35% with anterior septal/inferior septal lateral wall abnormalities.  He was placed on a LifeVest and repeat echo completed 3 months later showed reduced EF of 30%, giving indication for ICD implant.  He has not had any recurrent VT since implantation.  Patient was last seen on 05/23/2022 by Jackee Alberts, NP.  Patient reported he been doing well with no new cardiac complaints.  He been compliant with his right medication regimen and denied any adverse reactions.  His vitals were stable and he was euvolemic and  well compensated on exam.  Echocardiogram on 06/16/2022 indicated LVEF of 25 to 30%, LV demonstrates regional wall motion abnormalities with mid to apical anterior/anteroseptal/inferoseptal akinesis, akinesis of the apical inferior and apical lateral walls, akinesis of the true apex with LAD infarction pattern.  No LV thrombus noted, G1 DD, RV systolic function and size was normal, normal pulmonary artery systolic pressures, no significant valvular abnormalities were noted.  Patient was referred to EP to discuss possible BiV ICD due to severely decreased LV function.  Today he presents for follow-up.  He reports that he has been doing well overall. He denies chest pain, significant shortness of breath, orthponea or pnd.  He denies any palpitations, presyncope or syncope.  He does note for the last two months he has had increased lower ankle edema, denies any significant weight  changes. He reports adherence with all medications.   ROS: .   Today he denies chest pain, fatigue, palpitations, melena, hematuria, hemoptysis, diaphoresis, weakness, presyncope, syncope, orthopnea, and PND.  All other systems are reviewed and otherwise negative. Studies Reviewed: SABRA   EKG:  EKG is ordered today, personally reviewed, demonstrating  EKG Interpretation Date/Time:  Monday October 12 2023 13:47:57 EDT Ventricular Rate:  85 PR Interval:  166 QRS Duration:  104 QT Interval:  360 QTC Calculation: 428 R Axis:   -66  Text Interpretation: Normal sinus rhythm Left axis deviation Inferior infarct (cited on  or before 20-Sep-2020) Anterolateral infarct (cited on or before 20-Sep-2020) Inferior Q waves noted Repolarization abnormality Confirmed by Iori Gigante [phone]) on 10/12/2023 7:03:11 PM   CV Studies: Cardiac studies reviewed are outlined and summarized above. Otherwise please see EMR for full report. Cardiac Studies & Procedures    ______________________________________________________________________________________________ CARDIAC CATHETERIZATION  CARDIAC CATHETERIZATION 09/20/2020  Conclusion Images from the original result were not included.   Prox RCA lesion is 80% stenosed.  Previously placed Prox LAD to Mid LAD stent (unknown type) is widely patent.  Dist Cx lesion is 100% stenosed.  Prox Cx lesion is 30% stenosed.  1st Mrg lesion is 90% stenosed.  2nd Diag lesion is 90% stenosed.  A drug-eluting stent was successfully placed.  Post intervention, there is a 0% residual stenosis.  Eames Dibiasio is a 63 y.o. male   [id number] LOCATION:  FACILITY: [facility] PHYSICIAN: Dorn Lesches, M.D. 16-May-1960   DATE OF PROCEDURE:  09/20/2020  DATE OF DISCHARGE:     CARDIAC CATHETERIZATION / PCI DES RCA    History obtained from chart review.Gregory Barrick is a 63 y.o. male with CAD, GERD, DM, asthma, hyperlipidemia. In 2008 he had an anterior MI treated with a drug eluting stent in the LAD. His ejection fraction was 45% at that time. Myoview 2010 showed no ischemia and showed an ejection fraction of 40%. Echo January 2016 with LVEF=40%. Apical hypokinesis. No LV thrombus seen. Echo March 2021 with LVEF=35-40%. He has been on Entresto .  He is being seen 09/20/2020 for the evaluation of VT and chest pain.  He underwent DC cardioversion to sinus rhythm.  He was placed on amiodarone .  His post cardioversion rhythm/EKG revealed Q waves across his precordium with persistent ST segment elevation consistent with LV apical thrombus.  He presents now for diagnostic coronary angiography to rule out an ischemic etiology.  There is intention to implant a ICD.   PROCEDURE DESCRIPTION:  The patient was brought to the second floor [place] [facility] cath lab in the postabsorptive state. He was not premedicated . His right wrist was prepped and shaved in usual sterile fashion. Xylocaine  1% was used for local anesthesia. A 6  French sheath was inserted into the right radial artery using standard Seldinger technique. The patient received 3500 units  of heparin  intravenously.  5 [place] TIG catheter and pigtail catheters were used for selective coronary angiography and obtain left heart pressures.  Isovue dye is used for the entirety of the case.  Retrograde aortic, ventricular and pullback pressures were recorded.  Radial cocktail was administered via the SideArm sheath.  Patient received a total of 7500 units of heparin  per ACT of 279.  He received 60 mg of p.o. Plavix  followed by 20 mg of IV Pepcid .  Isovue dye was used for the entirety of the intervention.  Retrograde aortic pressures monitored during the case.  A total of 65 cc of contrast was administered the patient.  Radial cocktail was administered via the SideArm sheath.  Using a 6 [place] JR4 guide catheter along with 0.14/190 cm long [place] guidewire and a 2 mm x 12 mm balloon in the proximal RCA was predilated.  Following this a 2.25 x 18 mm long Medtronic resolute Onyx drug-eluting stent was then carefully positioned and deployed at 14 atm in the proximal RCA across the disease segment.  It was postdilated with a 2.5 mm x 15 mm long noncompliant balloon at 16 atm (2.6 mm) resulting reduction of a 80 to 90% ulcerated proximal RCA stenosis  to 0% residual.  Patient taught procedure well.  The guidewire and catheter were removed.  The sheath was removed and a TR band was placed on the right wrist to achieve patent hemostasis.  The patient left lab in stable condition.    Impression Mr Misch presented with VT and chest pain.  His pain resolved with cardioversion.  His enzymes are negative initially.  He does have queues across his precordium with persistent ST segment elevation consistent with apical thrombus suggesting that his VT may be scar mediated.  His cath revealed widely patent proximal LAD stent with TIMI II flow down the LAD.  He does have a high-grade first  marginal branch stenosis at the ostium which was described in the cath report in 2008 and was treated medically at that time.  His distal circumflex involving small posterolateral branches is subtotally occluded as well.  I believe his culprit lesion is an ulcerated proximal dominant RCA which was successfully stented.  I reviewed the films with both with Drs. Croitoru, his attending physician and Dr. Waddell, the electrophysiologist.  He will need implantation of an ICD for secondary prevention of sudden cardiac death given his presentation with ventricular tachycardia.  Dorn Lesches. MD, [facility] 09/20/2020 3:24 PM  Findings Coronary Findings Diagnostic  Dominance: Right  Left Anterior Descending Previously placed Prox LAD to Mid LAD stent (unknown type) is widely patent. Previously placed stent displays no rethrombosis. Previously placed stent displays no restenosis.  Second Diagonal Branch 2nd Diag lesion is 90% stenosed.  Left Circumflex Collaterals Dist Cx filled by collaterals from Mid Cx.  Prox Cx lesion is 30% stenosed. Dist Cx lesion is 100% stenosed.  First Obtuse Marginal Branch 1st Mrg lesion is 90% stenosed.  Right Coronary Artery Prox RCA lesion is 80% stenosed. Vessel is the culprit lesion. The lesion is ulcerative.  Intervention  Prox RCA lesion Stent Lesion crossed with guidewire. Pre-stent angioplasty was performed. A drug-eluting stent was successfully placed. Stent strut is well apposed. Post-stent angioplasty was performed. Post-Intervention Lesion Assessment The intervention was successful. Pre-interventional TIMI flow is 3. Post-intervention TIMI flow is 3. No complications occurred at this lesion. There is a 0% residual stenosis post intervention.     ECHOCARDIOGRAM  ECHOCARDIOGRAM COMPLETE 06/16/2022  Narrative ECHOCARDIOGRAM REPORT    Patient Name:   MCGWIRE DASARO Date of Exam: 06/16/2022 Medical Rec #:  [id number]      Height:       68.0  in Accession #:    [id number]     Weight:       144.4 lb Date of Birth:  [birth year]      BSA:          1.780 m Patient Age:    61 years       BP:           134/62 mmHg Patient Gender: M              HR:           64 bpm. Exam Location:  [address]  Procedure: 2D Echo, Cardiac Doppler, Color Doppler and Intracardiac Opacification Agent  Indications:    I50.9 Congestive Heart Failure  History:        Patient has prior history of Echocardiogram examinations, most recent 12/28/2020. CAD and Previous Myocardial Infarction, Defibrillator, COPD; Risk Factors:Hypertension, Diabetes and Dyslipidemia. Ischemic cardiomyopathy.  Sonographer:    Carl Coma RDCS Referring Phys: [id number] JACKEE VEAR RADDLE DICK  IMPRESSIONS   1. Left ventricular ejection  fraction, by estimation, is 25 to 30%. The left ventricle has severely decreased function. The left ventricle demonstrates regional wall motion abnormalities with mid to apical anterior/anteroseptal/inferoseptal akinesis, akinesis of the apical inferior and apical lateral walls, akinesis of the true apex. LAD infarction pattern. No LV thrombus noted. The left ventricular internal cavity size was mildly dilated. Left ventricular diastolic parameters are consistent with Grade I diastolic dysfunction (impaired relaxation). 2. Right ventricular systolic function is normal. The right ventricular size is normal. There is normal pulmonary artery systolic pressure. The estimated right ventricular systolic pressure is 32.4 mmHg. 3. The mitral valve is normal in structure. Trivial mitral valve regurgitation. No evidence of mitral stenosis. 4. The aortic valve is tricuspid. Aortic valve regurgitation is not visualized. No aortic stenosis is present. 5. The inferior vena cava is normal in size with greater than 50% respiratory variability, suggesting right atrial pressure of 3 mmHg.  FINDINGS Left Ventricle: Left ventricular ejection fraction, by  estimation, is 25 to 30%. The left ventricle has severely decreased function. The left ventricle demonstrates regional wall motion abnormalities. Definity  contrast agent was given IV to delineate the left ventricular endocardial borders. The left ventricular internal cavity size was mildly dilated. There is no left ventricular hypertrophy. Left ventricular diastolic parameters are consistent with Grade I diastolic dysfunction (impaired relaxation).  Right Ventricle: The right ventricular size is normal. No increase in right ventricular wall thickness. Right ventricular systolic function is normal. There is normal pulmonary artery systolic pressure. The tricuspid regurgitant velocity is 2.71 m/s, and with an assumed right atrial pressure of 3 mmHg, the estimated right ventricular systolic pressure is 32.4 mmHg.  Left Atrium: Left atrial size was normal in size.  Right Atrium: Right atrial size was normal in size.  Pericardium: Trivial pericardial effusion is present.  Mitral Valve: The mitral valve is normal in structure. Trivial mitral valve regurgitation. No evidence of mitral valve stenosis.  Tricuspid Valve: The tricuspid valve is normal in structure. Tricuspid valve regurgitation is mild.  Aortic Valve: The aortic valve is tricuspid. Aortic valve regurgitation is not visualized. No aortic stenosis is present.  Pulmonic Valve: The pulmonic valve was normal in structure. Pulmonic valve regurgitation is not visualized.  Aorta: The aortic root is normal in size and structure.  Venous: The inferior vena cava is normal in size with greater than 50% respiratory variability, suggesting right atrial pressure of 3 mmHg.  IAS/Shunts: No atrial level shunt detected by color flow Doppler.  Additional Comments: A device lead is visualized in the right ventricle.   LEFT VENTRICLE PLAX 2D LVIDd:         5.60 cm   Diastology LVIDs:         3.90 cm   LV e' medial:    6.25 cm/s LV PW:         0.80  cm   LV E/e' medial:  11.9 LV IVS:        0.80 cm   LV e' lateral:   6.09 cm/s LVOT diam:     2.10 cm   LV E/e' lateral: 12.2 LV SV:         55 LV SV Index:   31 LVOT Area:     3.46 cm   RIGHT VENTRICLE            IVC RV Basal diam:  3.80 cm    IVC diam: 1.10 cm RV S prime:     9.79 cm/s TAPSE (M-mode): 1.4 cm  LEFT  ATRIUM             Index        RIGHT ATRIUM           Index LA diam:        4.30 cm 2.42 cm/m   RA Area:     12.50 cm LA Vol (A2C):   46.1 ml 25.91 ml/m  RA Volume:   31.80 ml  17.87 ml/m LA Vol (A4C):   32.6 ml 18.32 ml/m LA Biplane Vol: 41.6 ml 23.38 ml/m AORTIC VALVE LVOT Vmax:   71.25 cm/s LVOT Vmean:  45.050 cm/s LVOT VTI:    0.159 m  AORTA Ao Root diam: 3.50 cm Ao Asc diam:  3.00 cm  MITRAL VALVE               TRICUSPID VALVE MV Area (PHT): 3.42 cm    TR Peak grad:   29.4 mmHg MV Decel Time: 222 msec    TR Vmax:        271.00 cm/s MV E velocity: 74.15 cm/s MV A velocity: 97.90 cm/s  SHUNTS MV E/A ratio:  0.76        Systemic VTI:  0.16 m Systemic Diam: 2.10 cm  Dalton McleanMD Electronically signed by Ezra Kanner Signature Date/Time: 06/16/2022/9:04:00 AM    Final          ______________________________________________________________________________________________       Current Reported Medications:.    Current Meds  Medication Sig   albuterol  (PROAIR  HFA) 108 (90 Base) MCG/ACT inhaler INHALE 2 PUFFS INTO THE LUNGS EVERY 4 HOURS AS NEEDED FOR WHEEZING OR SHORTNESS OF BREATH (PLAN B).   albuterol  (PROVENTIL ) (2.5 MG/3ML) 0.083% nebulizer solution Take 3 mLs (2.5 mg total) by nebulization every 6 (six) hours as needed for wheezing or shortness of breath.   aspirin  EC 81 MG tablet Take 1 tablet (81 mg total) by mouth daily.   atorvastatin  (LIPITOR ) 80 MG tablet Take 1 tablet (80 mg total) by mouth daily.   atovaquone -proguanil (MALARONE ) 250-100 MG TABS tablet Take 1 tablet by mouth daily.   bisoprolol  (ZEBETA ) 5 MG tablet Take  0.5 tablets (2.5 mg total) by mouth daily.   calcium -vitamin D  (OSCAL WITH D) 250-125 MG-UNIT tablet Take 1 tablet by mouth 2 (two) times daily.   dapagliflozin  propanediol (FARXIGA ) 10 MG TABS tablet Take 1 tablet (10 mg total) by mouth daily.   ferrous sulfate  325 (65 FE) MG tablet Take 325 mg by mouth daily with breakfast.   fluticasone  (FLONASE ) 50 MCG/ACT nasal spray Place 1 spray into both nostrils daily.   Fluticasone -Umeclidin-Vilant (TRELEGY ELLIPTA ) 200-62.5-25 MCG/ACT AEPB INHALE 1 PUFF ONCE DAILY   loratadine  (CLARITIN ) 10 MG tablet Take 1 tablet (10 mg total) by mouth daily.   metFORMIN  (GLUCOPHAGE ) 1000 MG tablet Take 1 tablet (1,000 mg total) by mouth 2 (two) times daily with a meal.   Multiple Vitamin (MULTIVITAMIN WITH MINERALS) TABS tablet Take 1 tablet by mouth daily.   nitroGLYCERIN  (NITROSTAT ) 0.4 MG SL tablet Place 1 tablet (0.4 mg total) under the tongue every 5 (five) minutes x 3 doses as needed for chest pain.   omeprazole  (PRILOSEC OTC) 20 MG tablet Take 20 mg by mouth daily.   predniSONE  (DELTASONE ) 10 MG tablet Take 1 tablet (10 mg total) by mouth daily with breakfast.   promethazine -dextromethorphan (PROMETHAZINE -DM) 6.25-15 MG/5ML syrup Take 5 mLs by mouth 4 (four) times daily as needed for cough.   [DISCONTINUED] Dupilumab  (DUPIXENT ) 300 MG/2ML SOAJ Inject  300 mg into the skin every 14 (fourteen) days.   [DISCONTINUED] valsartan  (DIOVAN ) 40 MG tablet Take 1 tablet (40 mg total) by mouth daily.Please have patient follow up with office to make appointment for any further [phone]. Thank you 1 attempt    Physical Exam:    VS:  BP 134/70   Pulse 85   Ht 5' 8 (1.727 m)   Wt 152 lb (68.9 kg)   SpO2 97%   BMI 23.11 kg/m    Wt Readings from Last 3 Encounters:  10/12/23 152 lb (68.9 kg)  05/22/23 150 lb (68 kg)  01/20/23 150 lb (68 kg)    GEN: Well nourished, well developed in no acute distress NECK: No JVD; No carotid bruits CARDIAC: RRR, no  murmurs, rubs, gallops RESPIRATORY:  Clear to auscultation without rales, wheezing or rhonchi  ABDOMEN: Soft, non-tender, non-distended EXTREMITIES:  +1 bilateral ankle edema; No acute deformity     Asessement and Plan:.    CAD: s/p anterior MI in 2008 treated with DES to LAD. LHC on 09/20/2020 in setting of VT and chest pain revealed widely patent proximal LAD stent with TIMI II flow down the LAD, noted to have high-grade first marginal branch stenosis at the ostium which was described in cath report in 2008 was treated medically at that time.  His distal circumflex involving the small posterior lateral branches was subtotally occluded.  It was felt that his culprit lesion was an ulcerated proximal dominant RCA which was successfully stented. Today patient reports that he is doing well, he denies any chest pain or significant shortness of breath.  Patient notes that he has some dyspnea with ongoing exertion specifically when lifting heavy objects, he feels this is related to his history of asthma and COPD.  He denies any significant changes.  EKG today indicates normal sinus rhythm at 85 bpm, Q waves noted as well as repolarization abnormality.  Reviewed ED precautions with patient.  Continue aspirin  81 mg daily, Lipitor  80 mg daily, bisoprolol  2.5 mg daily, Farxiga  10 mg daily and valsartan  40 mg daily. Check CBC and BMET.   Ischemic cardiomyopathy/VT: S/p ICD implantation for history of sustained VT and secondary prevention.  Last interrogation on/15/25 showed normal device function, presenting rhythm VS, irregular.  Heart failure diagnostics have been normal in the monitoring period. Patient recommended to follow-up with EP.  HFrEF: Echo prior to ICD placement on 12/2020 indicated EF 30 to 35%. Echocardiogram on 06/16/2022 indicated LVEF of 25 to 30%, LV demonstrates regional wall motion abnormalities with mid to apical anterior/anteroseptal/inferoseptal akinesis, akinesis of the apical inferior and  apical lateral walls, akinesis of the true apex with LAD infarction pattern.  No LV thrombus noted, G1 DD, RV systolic function and size was normal, normal pulmonary artery systolic pressures, no significant valvular abnormalities were noted. Entresto  previously discontinued given hypotension.  Today patient reports that he is doing well overall, he notes some stable dyspnea with ongoing exertion that he feels is related to his history of asthma and COPD.  He denies any orthopnea or PND.  He does endorse some mild bilateral ankle edema that he notes has been present the last 2 months.  On exam he has +1 bilateral ankle edema, otherwise he appears euvolemic and well compensated on exam.  Patient denies any significant weight changes.  Will have patient take Lasix 20 mg daily for the next 3 days, then to take only as needed for increased lower extremity edema, increased shortness of breath or  weight gain of 2 to 3 pounds overnight or 5 pounds in a week.  Encouraged patient to monitor his weights at home and notify the office of weight gain of 2 to 3 pounds overnight or 5 pounds in a week. GDMT has previously been limited by hypotension. Check BMET today, if renal function and potassium are stable consider starting spironolactone 12.5 mg daily. Check echocardiogram.  Continue bisoprolol  2.5 mg daily, Farxiga  10 mg daily and valsartan  40 mg daily.  Hypertension: Blood pressure today 134/70.  Continue current antihypertensive regimen, refill of valsartan  provided.  Consider adding spironolactone 12.5 mg daily as noted above.  Hyperlipidemia: Patient without recent fasting lipid profile.  Patient will return in 2 weeks for fasting lipid profile and LFTs.   Type 2 DM: Last hemoglobin A1C 8.1 on 05/22/23.  Monitor managed per PCP.    Disposition: F/u with Jayon Matton, NP in 8 weeks.   Signed, Deeann Servidio D Stefana Lodico, NP

## 2023-10-12 ENCOUNTER — Other Ambulatory Visit: Payer: Self-pay

## 2023-10-12 ENCOUNTER — Ambulatory Visit: Attending: Cardiology | Admitting: Cardiology

## 2023-10-12 ENCOUNTER — Encounter: Payer: Self-pay | Admitting: Cardiology

## 2023-10-12 ENCOUNTER — Other Ambulatory Visit: Payer: Self-pay | Admitting: Internal Medicine

## 2023-10-12 VITALS — BP 134/70 | HR 85 | Ht 68.0 in | Wt 152.0 lb

## 2023-10-12 DIAGNOSIS — Z95810 Presence of automatic (implantable) cardiac defibrillator: Secondary | ICD-10-CM | POA: Insufficient documentation

## 2023-10-12 DIAGNOSIS — I10 Essential (primary) hypertension: Secondary | ICD-10-CM | POA: Diagnosis not present

## 2023-10-12 DIAGNOSIS — I2510 Atherosclerotic heart disease of native coronary artery without angina pectoris: Secondary | ICD-10-CM | POA: Diagnosis not present

## 2023-10-12 DIAGNOSIS — J4489 Other specified chronic obstructive pulmonary disease: Secondary | ICD-10-CM

## 2023-10-12 DIAGNOSIS — I472 Ventricular tachycardia, unspecified: Secondary | ICD-10-CM | POA: Insufficient documentation

## 2023-10-12 DIAGNOSIS — I255 Ischemic cardiomyopathy: Secondary | ICD-10-CM | POA: Diagnosis not present

## 2023-10-12 DIAGNOSIS — I5020 Unspecified systolic (congestive) heart failure: Secondary | ICD-10-CM | POA: Diagnosis not present

## 2023-10-12 MED ORDER — VALSARTAN 40 MG PO TABS: 40.0000 mg | ORAL_TABLET | Freq: Every day | ORAL | 3 refills | Status: DC

## 2023-10-12 MED ORDER — DUPIXENT 300 MG/2ML ~~LOC~~ SOAJ
300.0000 mg | SUBCUTANEOUS | 1 refills | Status: DC
  Filled 2023-10-12: qty 12, 84d supply, fill #0
  Filled 2023-10-14 (×2): qty 4, 28d supply, fill #0
  Filled 2023-11-10: qty 4, 28d supply, fill #1
  Filled 2023-12-09 – 2023-12-22 (×4): qty 4, 28d supply, fill #2
  Filled 2024-01-14: qty 4, 28d supply, fill #3
  Filled 2024-02-09: qty 4, 28d supply, fill #4
  Filled 2024-03-07: qty 4, 28d supply, fill #5

## 2023-10-12 MED ORDER — VALSARTAN 40 MG PO TABS
40.0000 mg | ORAL_TABLET | Freq: Every day | ORAL | 3 refills | Status: DC
Start: 2023-10-12 — End: 2023-10-12

## 2023-10-12 NOTE — Patient Instructions (Addendum)
 Medication Instructions:  Start, lasix 20 mg once a day for the next 3 days then take as needed for Lower extremity edema, SOB, or a weight gain of 2-3 lbs overnight or 5 lbs in a week *If you need a refill on your cardiac medications before your next appointment, please call your pharmacy*  Lab Work: Today we are going to draw a CBC and Bmet In 2 weeks we are going to need a fasting lipid panel and Cmet If you have labs (blood work) drawn today and your tests are completely normal, you will receive your results only by: MyChart Message (if you have MyChart) OR A paper copy in the mail If you have any lab test that is abnormal or we need to change your treatment, we will call you to review the results.  Testing/Procedures: Your physician has requested that you have an echocardiogram. Echocardiography is a painless test that uses sound waves to create images of your heart. It provides your doctor with information about the size and shape of your heart and how well your heart's chambers and valves are working. This procedure takes approximately one hour. There are no restrictions for this procedure. Please do NOT wear cologne, perfume, aftershave, or lotions (deodorant is allowed). Please arrive 15 minutes prior to your appointment time.  Please note: We ask at that you not bring children with you during ultrasound (echo/ vascular) testing. Due to room size and safety concerns, children are not allowed in the ultrasound rooms during exams. Our front office staff cannot provide observation of children in our lobby area while testing is being conducted. An adult accompanying a patient to their appointment will only be allowed in the ultrasound room at the discretion of the ultrasound technician under special circumstances. We apologize for any inconvenience.  Follow-Up: At [facility], you and your health needs are our priority.  As part of our continuing mission to provide you with  exceptional heart care, our providers are all part of one team.  This team includes your primary Cardiologist (physician) and Advanced Practice Providers or APPs (Physician Assistants and Nurse Practitioners) who all work together to provide you with the care you need, when you need it.  Your next appointment:   8 week(s)  Provider:   Katlyn West, NP Then, Lonni Cash, MD will plan to see you again in 3 month(s).    We recommend signing up for the patient portal called MyChart.  Sign up information is provided on this After Visit Summary.  MyChart is used to connect with patients for Virtual Visits (Telemedicine).  Patients are able to view lab/test results, encounter notes, upcoming appointments, etc.  Non-urgent messages can be sent to your provider as well.   To learn more about what you can do with MyChart, go to [email or web address].   Other Instructions We are going to have you follow up with EP after Echocardiogram

## 2023-10-13 LAB — CBC
Hematocrit: 48.6 % (ref 37.5–51.0)
Hemoglobin: 15 g/dL (ref 13.0–17.7)
MCH: 24.8 pg — ABNORMAL LOW (ref 26.6–33.0)
MCHC: 30.9 g/dL — ABNORMAL LOW (ref 31.5–35.7)
MCV: 80 fL (ref 79–97)
Platelets: 201 10*3/uL (ref 150–450)
RBC: 6.06 x10E6/uL — ABNORMAL HIGH (ref 4.14–5.80)
RDW: 13.9 % (ref 11.6–15.4)
WBC: 11.8 10*3/uL — ABNORMAL HIGH (ref 3.4–10.8)

## 2023-10-13 LAB — BASIC METABOLIC PANEL WITH GFR
BUN/Creatinine Ratio: 13 (ref 10–24)
BUN: 13 mg/dL (ref 8–27)
CO2: 18 mmol/L — ABNORMAL LOW (ref 20–29)
Calcium: 9.9 mg/dL (ref 8.6–10.2)
Chloride: 100 mmol/L (ref 96–106)
Creatinine, Ser: 0.97 mg/dL (ref 0.76–1.27)
Glucose: 92 mg/dL (ref 70–99)
Potassium: 5.2 mmol/L (ref 3.5–5.2)
Sodium: 142 mmol/L (ref 134–144)
eGFR: 87 mL/min/{1.73_m2} (ref >59)

## 2023-10-14 ENCOUNTER — Other Ambulatory Visit: Payer: Self-pay

## 2023-10-14 ENCOUNTER — Ambulatory Visit: Payer: Self-pay | Admitting: Cardiology

## 2023-10-14 ENCOUNTER — Telehealth: Payer: Self-pay | Admitting: Pharmacist

## 2023-10-14 ENCOUNTER — Other Ambulatory Visit (HOSPITAL_COMMUNITY): Payer: Self-pay

## 2023-10-14 NOTE — Telephone Encounter (Signed)
 Received notification from [facility] regarding a prior authorization for DUPIXENT . Authorization has been APPROVED from 10/14/2023 to 04/11/2024. Approval letter sent to scan center.  Patient can continue to fill through [facility]: [phone]   Authorization # [id number]  Sherry Pennant, PharmD, MPH, BCPS, CPP Clinical Pharmacist (Rheumatology and Pulmonology)

## 2023-10-14 NOTE — Telephone Encounter (Signed)
 Submitted a Prior Authorization RENEWAL request to [facility] for DUPIXENT  via CoverMyMeds. Will update once we receive a response.  Key: [id number]

## 2023-10-14 NOTE — Progress Notes (Signed)
 Submitted a Prior Authorization RENEWAL request to [facility] for DUPIXENT  via CoverMyMeds. Will update once we receive a response.  Key: [id number]

## 2023-10-14 NOTE — Progress Notes (Signed)
 Specialty Pharmacy Refill Coordination Note  Spoke with [name], [name] (Self).   [name] is a 63 y.o. male contacted today regarding refills of specialty medication(s) Dupilumab  (Dupixent )  Injection date: 10/19/23.   Patient requested: Pickup at [facility] at [facility] date: 10/15/23 (or 10/16/23)  Medication will be filled on 10/14/23 or 10/15/23.  This medication requires a new prior authorization, and is currently being processed by the PA team. Specialty Pharmacy team will contact patient if any delays arise.

## 2023-10-14 NOTE — Progress Notes (Signed)
PA for Dupixent approved

## 2023-10-22 ENCOUNTER — Other Ambulatory Visit (HOSPITAL_COMMUNITY): Payer: Self-pay

## 2023-10-26 NOTE — Telephone Encounter (Signed)
 Called patient advised of below they verbalized understanding.

## 2023-10-26 NOTE — Telephone Encounter (Signed)
-----   Message from Katlyn D [place] sent at 10/14/2023  7:29 AM EDT ----- Please let Mr. Koeppen know that his CBC showed minimally elevated white blood cell count, improved from prior labs. His hemoglobin and hematocrit are normal. His kidney function is normal, his  potassium is normal however on the higher range. He has lab work in two weeks (CMET and fasting lipid profile) will re-evaluate at that time. Overall good results, continue current medications and  follow up as planned.  ----- Message ----- From: Rebecka Memos Lab Results In Sent: 10/12/2023  11:36 PM EDT To: Katlyn D West, NP

## 2023-10-28 DIAGNOSIS — I255 Ischemic cardiomyopathy: Secondary | ICD-10-CM | POA: Diagnosis not present

## 2023-10-28 DIAGNOSIS — Z95810 Presence of automatic (implantable) cardiac defibrillator: Secondary | ICD-10-CM | POA: Diagnosis not present

## 2023-10-28 DIAGNOSIS — I5020 Unspecified systolic (congestive) heart failure: Secondary | ICD-10-CM | POA: Diagnosis not present

## 2023-10-28 DIAGNOSIS — I10 Essential (primary) hypertension: Secondary | ICD-10-CM | POA: Diagnosis not present

## 2023-10-28 DIAGNOSIS — I2510 Atherosclerotic heart disease of native coronary artery without angina pectoris: Secondary | ICD-10-CM | POA: Diagnosis not present

## 2023-10-28 DIAGNOSIS — I472 Ventricular tachycardia, unspecified: Secondary | ICD-10-CM | POA: Diagnosis not present

## 2023-10-29 LAB — COMPREHENSIVE METABOLIC PANEL WITH GFR
ALT: 21 IU/L (ref 0–44)
AST: 21 IU/L (ref 0–40)
Albumin: 4.3 g/dL (ref 3.9–4.9)
Alkaline Phosphatase: 51 IU/L (ref 44–121)
BUN/Creatinine Ratio: 15 (ref 10–24)
BUN: 16 mg/dL (ref 8–27)
Bilirubin Total: 0.4 mg/dL (ref 0.0–1.2)
CO2: 21 mmol/L (ref 20–29)
Calcium: 9.3 mg/dL (ref 8.6–10.2)
Chloride: 104 mmol/L (ref 96–106)
Creatinine, Ser: 1.1 mg/dL (ref 0.76–1.27)
Globulin, Total: 2.4 g/dL (ref 1.5–4.5)
Glucose: 104 mg/dL — ABNORMAL HIGH (ref 70–99)
Potassium: 4.2 mmol/L (ref 3.5–5.2)
Sodium: 141 mmol/L (ref 134–144)
Total Protein: 6.7 g/dL (ref 6.0–8.5)
eGFR: 75 mL/min/1.73 (ref >59)

## 2023-10-29 LAB — LIPID PANEL
Chol/HDL Ratio: 3 ratio (ref 0.0–5.0)
Cholesterol, Total: 139 mg/dL (ref 100–199)
HDL: 47 mg/dL (ref >39)
LDL Chol Calc (NIH): 68 mg/dL (ref 0–99)
Triglycerides: 136 mg/dL (ref 0–149)
VLDL Cholesterol Cal: 24 mg/dL (ref 5–40)

## 2023-11-03 ENCOUNTER — Ambulatory Visit

## 2023-11-03 DIAGNOSIS — I255 Ischemic cardiomyopathy: Secondary | ICD-10-CM | POA: Diagnosis not present

## 2023-11-03 LAB — CUP PACEART REMOTE DEVICE CHECK
Battery Remaining Longevity: 138 mo
Battery Remaining Percentage: 96 %
Brady Statistic RV Percent Paced: 0 %
Date Time Interrogation Session: 20250715051700
HighPow Impedance: 79 Ohm
Implantable Lead Connection Status: 753985
Implantable Lead Implant Date: 20221017
Implantable Lead Location: 753860
Implantable Lead Model: 138
Implantable Lead Serial Number: 304145
Implantable Pulse Generator Implant Date: 20221017
Lead Channel Impedance Value: 456 Ohm
Lead Channel Setting Pacing Amplitude: 2.5 V
Lead Channel Setting Pacing Pulse Width: 0.4 ms
Lead Channel Setting Sensing Sensitivity: 0.5 mV
Pulse Gen Serial Number: 213143

## 2023-11-04 ENCOUNTER — Ambulatory Visit: Payer: Self-pay | Admitting: Internal Medicine

## 2023-11-10 ENCOUNTER — Other Ambulatory Visit (HOSPITAL_COMMUNITY): Payer: Self-pay

## 2023-11-11 ENCOUNTER — Ambulatory Visit: Admitting: Primary Care

## 2023-11-11 ENCOUNTER — Encounter: Payer: Self-pay | Admitting: Primary Care

## 2023-11-11 VITALS — BP 130/68 | HR 62 | Temp 98.0°F | Ht 68.0 in | Wt 150.2 lb

## 2023-11-11 DIAGNOSIS — J4489 Other specified chronic obstructive pulmonary disease: Secondary | ICD-10-CM

## 2023-11-11 MED ORDER — ALBUTEROL SULFATE HFA 108 (90 BASE) MCG/ACT IN AERS: 2.0000 | INHALATION_SPRAY | RESPIRATORY_TRACT | 6 refills | Status: DC | PRN

## 2023-11-11 NOTE — Telephone Encounter (Signed)
 Also he was requesting 90 day supply if possible

## 2023-11-11 NOTE — Progress Notes (Signed)
 @Patient  ID: [name], male    DOB: 07/19/1960, 63 y.o.   MRN: [id number]  No chief complaint on file.   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 He is a patient of Dr. Chari and last seen in office 09/23/2021. Past medical history significant for CAD, HFrEF, cardiomyopathy s/p ICD, HTN, GERD, chronic sinusitis, HLD, recurrent otitis with perforated tympanic membrane.   Previous [facility] pulmonary encounter:  09/23/2021: Ov with Dr. Darlean. Maintained on Trelegy and prednisone  10 mg daily. Doing well. Has not had to use albuterol  recently. Advised to start tapering prednisone  to target of 2.5 mg per day. Continue dupixent .   02/28/2022: Today-follow-up Patient presents today for follow-up.  He is also having some acute symptoms.  He has had increased nasal congestion and white to yellow drainage over the past month to month and a half.  He is also had some pus colored drainage coming from his right ear.  He has had issues in the past with recurrent otitis and was previously followed by Dr. Ethyl but has not seen him in quite some time.  Fortunately, his breathing is stable.  He does not have any increased shortness of breath, wheezing or chest congestion.  Does have a minimal cough, which is unchanged from his baseline.  Denies any fevers, chills, headaches, sore throats, changes in hearing.  No viral testing or known sick exposures.  He continues on Trelegy daily.  He was only having to use his albuterol  once a week but since he had increased nasal congestion, he has been using it once a day in the morning.  Not currently on any nasal sprays.  Dupixent  injection is due today.  He tried to decrease prednisone  to 5 mg.  Felt like his breathing got worse so he is back at 10 mg daily.  He tries at least once a month to start tapering.  04/04/2022  He is feeling better but still has nasal congestion,  symptoms are worse in the morning. He has difficulty breathing through his nose at night. He is fine during the daytime. He still has a small amount of drainage from right ear. He has no ear pain. He uses saline nasal rinses 1-2 times a day. He is taking trelegy, prednisone  10mg  daily and dupixent  as directed.   01/20/2023 Patient presents today for acute OV. He has had a dry cough for 3 weeks. Not currently taking anything over the counter for cough. No associated PND, reflux symptoms, shortness of breath, chest tightness or wheezing. He is getting up clear mucus mostly in the morning. He is taking Trelegy 200mcg once daily and dupixent  injections as directed. He ran our of prednisone  1 week ago.    11/11/2023- Interim hx  Discussed the use of AI scribe software for clinical note transcription with the patient, who gave verbal consent to proceed.  History of Present Illness   [name] is a 63 year old male with COPD and asthma who presents for a check-in regarding his Dupixent  treatment. Prior auth expired on June 11th, medication has been approved through 04/11/24.   He missed a month of Dupixent  injections but restarted the treatment approximately two weeks ago. His last injection was on November 02, 2023, and he is on a bi-weekly schedule, with the next dose due on the upcoming Monday. When he misses a dose of Dupixent , he experiences increased wheezing and chest tightness.  He has not experienced any asthma flare-ups requiring prednisone  and reports that his symptoms are well controlled with the current regimen.  He is currently using Trelegy inhaler once daily and has not needed to use his albuterol  rescue inhaler for the past six months. He confirms receiving assistance for the Trelegy medication. In the past four weeks, asthma has only slightly limited his activities, with no shortness of breath, nighttime awakenings, or need for rescue inhalers. He rates his asthma control as 'completely  controlled'.  He mentions a previous heart stress test where he needed to use his inhaler due to difficulty breathing during the test. He is no longer taking Farxiga  as per his cardiologist's advice and is currently only on aspirin  for cardiac management. No recent hospitalizations, cough, or need for rescue inhalers in the past four weeks. No shortness of breath or asthma symptoms waking him at night.      Allergies  Allergen Reactions   Doxycycline  Rash    Immunization History  Administered Date(s) Administered   Hepatitis A, Adult 03/21/2015   Hepatitis B, ADULT 03/21/2015   Influenza Split 03/21/2011, 12/21/2011, 01/19/2013, 01/20/2015   Influenza Whole 01/20/2008, 01/30/2009   Influenza, Seasonal, Injecte, Preservative Fre 05/22/2023   Influenza,inj,Quad PF,6+ Mos 12/21/2015, 05/06/2016, 01/05/2018, 04/27/2019, 01/10/2020   MMR 03/21/2015   Meningococcal Mcv4o 05/06/2016   PFIZER Comirnaty(Gray Top)Covid-19 Tri-Sucrose Vaccine 08/07/2020   PFIZER(Purple Top)SARS-COV-2 Vaccination 01/10/2020, 02/07/2020   Pneumococcal Polysaccharide-23 01/30/2009, 08/14/2017   Td 05/27/2004   Tdap 02/27/2015    Past Medical History:  Diagnosis Date   Asthma    CAD (coronary artery disease)    a. CAD s/p anterior MI in 2008 treated with DES to the LAD.   Cataract 2018   both eyes   Cellulitis of leg, right 10/2014   COPD (chronic obstructive pulmonary disease) (HCC)    Dental caries    Diabetes mellitus    TYPE 2   GERD (gastroesophageal reflux disease)    Hyperlipidemia    Hypertension 2017   Ischemic cardiomyopathy    Leukocytosis    noted on labs   Microcytic anemia    Myocardial infarction [facility]) 2008   Reactive airway disease    Thyroid  nodule    biopsy negative 2006    Tobacco History: Social History   Tobacco Use  Smoking Status Former   Current packs/day: 0.00   Average packs/day: 0.5 packs/day for 22.0 years (11.0 ttl pk-yrs)   Types: Cigarettes   Start date:  04/21/1984   Quit date: 04/21/2006   Years since quitting: 17.5   Passive exposure: Past  Smokeless Tobacco Never  Tobacco Comments   quit 10 years ago   Counseling given: Not Answered Tobacco comments: quit 10 years ago    Review of Systems  Review of Systems  Constitutional: Negative.   HENT: Negative.    Respiratory: Negative.  Negative for cough, chest tightness, shortness of breath and wheezing.      Physical Exam  There were no vitals taken for this visit. Physical Exam Constitutional:      General: He is not in acute distress.    Appearance: Normal appearance. He is not ill-appearing.  HENT:     Head: Normocephalic and atraumatic.     Mouth/Throat:     Mouth: Mucous membranes are moist.     Pharynx: Oropharynx is clear.  Cardiovascular:     Rate and Rhythm: Normal rate and regular rhythm.  Pulmonary:     Effort: Pulmonary effort is normal.  Breath sounds: Normal breath sounds.     Comments: CTA Skin:    General: Skin is warm and dry.  Neurological:     General: No focal deficit present.     Mental Status: He is alert and oriented to person, place, and time. Mental status is at baseline.  Psychiatric:        Mood and Affect: Mood normal.        Behavior: Behavior normal.        Thought Content: Thought content normal.        Judgment: Judgment normal.      Lab Results:  CBC    Component Value Date/Time   WBC 11.8 (H) 10/12/2023 1452   WBC 15.7 (H) 02/11/2021 0851   RBC 6.06 (H) 10/12/2023 1452   RBC 5.95 (H) 02/11/2021 0851   HGB 15.0 10/12/2023 1452   HGB 13.3 07/05/2014 1431   HCT 48.6 10/12/2023 1452   HCT 40.1 07/05/2014 1431   PLT 201 10/12/2023 1452   MCV 80 10/12/2023 1452   MCV 72 (L) 07/05/2014 1431   MCH 24.8 (L) 10/12/2023 1452   MCH 23.7 (L) 02/11/2021 0851   MCHC 30.9 (L) 10/12/2023 1452   MCHC 30.9 02/11/2021 0851   RDW 13.9 10/12/2023 1452   RDW 14.4 07/05/2014 1431   LYMPHSABS 3.3 02/11/2021 0851   LYMPHSABS 1.5  02/01/2021 1405   LYMPHSABS 1.3 07/05/2014 1431   MONOABS 1.2 (H) 02/11/2021 0851   EOSABS 0.7 (H) 02/11/2021 0851   EOSABS 0.2 02/01/2021 1405   EOSABS 0.1 07/05/2014 1431   BASOSABS 0.1 02/11/2021 0851   BASOSABS 0.1 02/01/2021 1405   BASOSABS 0.1 07/05/2014 1431    BMET    Component Value Date/Time   NA 141 10/28/2023 1126   K 4.2 10/28/2023 1126   CL 104 10/28/2023 1126   CO2 21 10/28/2023 1126   GLUCOSE 104 (H) 10/28/2023 1126   GLUCOSE 97 02/11/2021 0851   BUN 16 10/28/2023 1126   CREATININE 1.10 10/28/2023 1126   CREATININE 0.98 09/17/2012 1602   CALCIUM  9.3 10/28/2023 1126   GFRNONAA >60 02/11/2021 0851   GFRAA 83 03/21/2020 1019    BNP No results found for: BNP  ProBNP    Component Value Date/Time   PROBNP 131.0 (H) 02/20/2007 0425    Imaging: CUP PACEART REMOTE DEVICE CHECK Result Date: 11/03/2023 ICD Scheduled remote reviewed. Normal device function.  Presenting rhythm: Regular VS Next remote 91 days. LA, CVRS    Assessment & Plan:   1. COPD with asthma (HCC) (Primary)  Assessment and Plan    COPD with asthma COPD with asthma is well-controlled with Dupixent  and Trelegy. Dupixent  is administered biweekly, and Trelegy is taken daily. He reports increased wheezing and chest tightness if Dupixent  dose is missed. No recent asthma exacerbations requiring prednisone  or hospitalizations. Asthma control test (24) shows no shortness of breath, nocturnal symptoms, or need for rescue inhaler in the past four weeks. He rates his asthma control as completely controlled. - Continue Dupixent  every two weeks. - Continue Trelegy 200mcg once daily. - Refill prescription for Albuterol  rescue inhaler. - FU in 6 months or sooner if needed    Almarie LELON Ferrari, NP 11/11/2023

## 2023-11-11 NOTE — Telephone Encounter (Signed)
 ATC X1. LMTCB

## 2023-11-11 NOTE — Patient Instructions (Signed)
  VISIT SUMMARY: You had a follow-up appointment to check on your COPD and asthma management, particularly focusing on your Dupixent  treatment. You mentioned missing a month of Dupixent  injections but have since restarted the treatment. Your symptoms are well controlled with your current regimen, and you have not needed to use your rescue inhaler for the past six months.  YOUR PLAN: -COPD WITH ASTHMA: COPD with asthma is a chronic condition that affects your lungs, making it hard to breathe. Your condition is well-controlled with your current medications, Dupixent  and Trelegy. Dupixent  is an injection you take every two weeks, and Trelegy is an inhaler you use once daily. You should continue with these medications as prescribed. A refill for your rescue inhaler has also been provided in case you need it.  INSTRUCTIONS: Please continue taking Dupixent  every two weeks and Trelegy once daily as prescribed. Your next Dupixent  dose is due on the upcoming Monday. A refill for your rescue inhaler has been provided. If you experience any worsening symptoms or have any concerns, please schedule a follow-up appointment.  Follow-up 6 months with Beth NP in person or sooner if needed

## 2023-11-11 NOTE — Telephone Encounter (Signed)
 Every year is fine as long as he is compliant with appointments  Medicaid hasn't approved 90 day supply but we can try again  Sherry Pennant, PharmD, MPH, BCPS, CPP Clinical Pharmacist (Rheumatology and Pulmonology)

## 2023-11-11 NOTE — Telephone Encounter (Signed)
 Will I need to see him before 04/11/24 or would 6 months be ok?

## 2023-11-11 NOTE — Telephone Encounter (Signed)
 [name] let patient know 6 month fu is fine and previously medicaid did not approve 90 day supply but our pharmacist will check again

## 2023-11-12 ENCOUNTER — Other Ambulatory Visit: Payer: Self-pay

## 2023-11-12 ENCOUNTER — Other Ambulatory Visit: Payer: Self-pay | Admitting: Pharmacy Technician

## 2023-11-12 NOTE — Progress Notes (Signed)
 Specialty Pharmacy Refill Coordination Note  [name] is a 63 y.o. male contacted today regarding refills of specialty medication(s) Dupilumab  (Dupixent )   Patient requested [name] at [facility] at [place] date: 11/12/23   Medication will be filled on 11/12/23.

## 2023-11-18 ENCOUNTER — Other Ambulatory Visit: Payer: Self-pay | Admitting: Student

## 2023-11-18 ENCOUNTER — Other Ambulatory Visit (HOSPITAL_COMMUNITY): Payer: Self-pay

## 2023-11-18 DIAGNOSIS — I10 Essential (primary) hypertension: Secondary | ICD-10-CM

## 2023-11-18 DIAGNOSIS — I5020 Unspecified systolic (congestive) heart failure: Secondary | ICD-10-CM

## 2023-11-19 ENCOUNTER — Other Ambulatory Visit: Payer: Self-pay

## 2023-11-20 NOTE — Telephone Encounter (Signed)
 I called and spoke to pt. Pt informed of Beth's note and verbalized understanding. NFN

## 2023-11-30 ENCOUNTER — Other Ambulatory Visit: Payer: Self-pay | Admitting: *Deleted

## 2023-11-30 ENCOUNTER — Ambulatory Visit (HOSPITAL_COMMUNITY)
Admission: RE | Admit: 2023-11-30 | Discharge: 2023-11-30 | Disposition: A | Discharge status: Home / Self Care | Source: Ambulatory Visit | Attending: Cardiology | Admitting: Cardiology

## 2023-11-30 DIAGNOSIS — E118 Type 2 diabetes mellitus with unspecified complications: Secondary | ICD-10-CM

## 2023-11-30 DIAGNOSIS — I5020 Unspecified systolic (congestive) heart failure: Secondary | ICD-10-CM | POA: Diagnosis present

## 2023-11-30 LAB — ECHOCARDIOGRAM COMPLETE
Area-P 1/2: 3.81 cm2
Est EF: 30
S' Lateral: 3.79 cm

## 2023-11-30 MED ORDER — PERFLUTREN LIPID MICROSPHERE
1.0000 mL | INTRAVENOUS | Status: AC | PRN
  Administered 2023-11-30 (×2): 2 mL via INTRAVENOUS

## 2023-12-01 MED ORDER — METFORMIN HCL 1000 MG PO TABS: 1000.0000 mg | ORAL_TABLET | Freq: Two times a day (BID) | ORAL | 1 refills | Status: DC

## 2023-12-06 NOTE — Progress Notes (Unsigned)
  Electrophysiology Office Note:   ID:  [name], DOB 12/22/1960, MRN [id number]  Primary Cardiologist: Lonni Cash, MD Electrophysiologist: Danelle Birmingham, MD  {Click to update primary MD,subspecialty MD or APP then REFRESH:1}    History of Present Illness:   [name] is a 63 y.o. male with h/o HTN, CAD, VT, ICM, HFrEF s/p ICD seen today for routine electrophysiology followup.   Since last being seen in our clinic the patient reports doing ***.  he denies chest pain, palpitations, dyspnea, PND, orthopnea, nausea, vomiting, dizziness, syncope, edema, weight gain, or early satiety.   Review of systems complete and found to be negative unless listed in HPI.   EP Information / Studies Reviewed:    EKG is not ordered today. EKG from 10/12/2023 reviewed which showed NSR at 85 bpm with QRS of 104 ms       ICD Interrogation-  reviewed in detail today,  See PACEART report.  Arrhythmia/Device History BSX ICD LATITUDE-GT   Physical Exam:   VS:  There were no vitals taken for this visit.   Wt Readings from Last 3 Encounters:  11/11/23 150 lb 3.2 oz (68.1 kg)  10/12/23 152 lb (68.9 kg)  05/22/23 150 lb (68 kg)     GEN: No acute distress *** NECK: No JVD; No carotid bruits CARDIAC: {EPRHYTHM:28826}, no murmurs, rubs, gallops RESPIRATORY:  Clear to auscultation without rales, wheezing or rhonchi  ABDOMEN: Soft, non-tender, non-distended EXTREMITIES:  {EDEMA LEVEL:28147::No} edema; No deformity   ASSESSMENT AND PLAN:    Chronic systolic CHF  s/p [employer] single chamber ICD  Ventricular Tachycardia euvolemic today Stable on an appropriate medical regimen Normal ICD function See Pace Art report No changes today  *** NYHA symptoms.   Would not currently qualify for CCM therapy with lack of insurance; but suspect he would benefit from long term ***  HTN Stable on current regimen  GDMT previously limited by hypotension  CAD No s/s of ischemia.        Disposition:   Follow up with {EPPROVIDERS:28135::EP Team} {EPFOLLOW UP:28173}   Signed, Ozell Prentice Passey, PA-C

## 2023-12-07 ENCOUNTER — Encounter: Payer: Self-pay | Admitting: Student

## 2023-12-07 ENCOUNTER — Ambulatory Visit: Payer: Self-pay | Attending: Student | Admitting: Student

## 2023-12-07 VITALS — BP 125/79 | HR 82 | Ht 68.0 in | Wt 150.0 lb

## 2023-12-07 DIAGNOSIS — I255 Ischemic cardiomyopathy: Secondary | ICD-10-CM | POA: Insufficient documentation

## 2023-12-07 DIAGNOSIS — I2510 Atherosclerotic heart disease of native coronary artery without angina pectoris: Secondary | ICD-10-CM | POA: Diagnosis not present

## 2023-12-07 DIAGNOSIS — I5020 Unspecified systolic (congestive) heart failure: Secondary | ICD-10-CM | POA: Insufficient documentation

## 2023-12-07 DIAGNOSIS — Z95810 Presence of automatic (implantable) cardiac defibrillator: Secondary | ICD-10-CM | POA: Insufficient documentation

## 2023-12-07 LAB — CUP PACEART INCLINIC DEVICE CHECK
Date Time Interrogation Session: 20250818094233
HighPow Impedance: 69 Ohm
HighPow Impedance: 79 Ohm
Implantable Lead Connection Status: 753985
Implantable Lead Implant Date: 20221017
Implantable Lead Location: 753860
Implantable Lead Model: 138
Implantable Lead Serial Number: 304145
Implantable Pulse Generator Implant Date: 20221017
Lead Channel Impedance Value: 464 Ohm
Lead Channel Pacing Threshold Amplitude: 1.3 V
Lead Channel Pacing Threshold Pulse Width: 0.4 ms
Lead Channel Sensing Intrinsic Amplitude: 21.1 mV
Lead Channel Setting Pacing Amplitude: 2.5 V
Lead Channel Setting Pacing Pulse Width: 0.4 ms
Lead Channel Setting Sensing Sensitivity: 0.5 mV
Pulse Gen Serial Number: 213143

## 2023-12-07 NOTE — Patient Instructions (Signed)
 Medication Instructions:  Your physician recommends that you continue on your current medications as directed. Please refer to the Current Medication list given to you today.  *If you need a refill on your cardiac medications before your next appointment, please call your pharmacy*  Lab Work: None ordered If you have labs (blood work) drawn today and your tests are completely normal, you will receive your results only by: MyChart Message (if you have MyChart) OR A paper copy in the mail If you have any lab test that is abnormal or we need to change your treatment, we will call you to review the results.  Follow-Up: At [facility], you and your health needs are our priority.  As part of our continuing mission to provide you with exceptional heart care, our providers are all part of one team.  This team includes your primary Cardiologist (physician) and Advanced Practice Providers or APPs (Physician Assistants and Nurse Practitioners) who all work together to provide you with the care you need, when you need it.  Your next appointment:   1 year(s)  Provider:   You will see one of the following Advanced Practice Providers on your designated Care Team:   Mertha Abrahams, Kennard Pea [address] [place], PA-C Suzann Riddle, NP Creighton Doffing, NP

## 2023-12-09 ENCOUNTER — Other Ambulatory Visit: Payer: Self-pay

## 2023-12-09 ENCOUNTER — Other Ambulatory Visit (HOSPITAL_COMMUNITY): Payer: Self-pay

## 2023-12-09 ENCOUNTER — Telehealth: Payer: Self-pay

## 2023-12-09 ENCOUNTER — Other Ambulatory Visit: Payer: Self-pay | Admitting: Pharmacy Technician

## 2023-12-09 NOTE — Progress Notes (Deleted)
 Cardiology Office Note    Date:  12/09/2023  ID:  Abhijot, Straughter [birth year], MRN [id number] PCP:  Nicholas Bar, MD  Cardiologist:  Lonni Cash, MD  Electrophysiologist:  Danelle Birmingham, MD   Chief Complaint: ***  History of Present Illness: Theodore    [name] is a 63 y.o. male with visit-pertinent history of CAD s/p anterior MI in 2008 treated with DES to LAD, microcytic anemia, HFrEF, ICM, asthma, hyperlipidemia, hypertension, COPD, VT s/p ICD in 01/2021.  [name] hsa been followed by Dr. Cash since 2012 for management of CAD.  In 2016 echo indicated EF of 35 to 40% with akinesis of LV thrombus and aneurysm forming with G1 DD.  GDMT was optimized and 2D echo completed in 2020 showed reduced EF of 25 to 30%.  He presented the ED on 09/20/2020 for complaints of chest pain that reminded him of his MI in 2008.  He also had complaints of palpitations with lightheadedness and EMS was contacted and Theodore was found to be in wide-complex tachycardia, he was given amiodarone  while en route to the hospital.  When he arrived he was unstable and blood pressures were in the 70s requiring DCCV at 100 J and return to sinus rhythm.  LHC was performed and showed widely patent LAD with minimal stenosis in proximal RCA that was treated with DES x 1.  EP consult was placed, Theodore was evaluated by Dr. Birmingham for VT and ICM.  Echo was completed and revealed EF 30 to 35% with anterior septal/inferior septal lateral wall abnormalities.  He was placed on a LifeVest and repeat echo completed 3 months later showed reduced EF of 30%, giving indication for ICD implant.  He has not had any recurrent VT since implantation.  Theodore was last seen on 05/23/2022 by Jackee Alberts, NP.  Theodore reported he been doing well with no new cardiac complaints.  He been compliant with his right medication regimen and denied any adverse reactions.  His vitals were stable and he was euvolemic and well compensated on exam.   Echocardiogram on 06/16/2022 indicated LVEF of 25 to 30%, LV demonstrates regional wall motion abnormalities with mid to apical anterior/anteroseptal/inferoseptal akinesis, akinesis of the apical inferior and apical lateral walls, akinesis of the true apex with LAD infarction pattern.  No LV thrombus noted, G1 DD, RV systolic function and size was normal, normal pulmonary artery systolic pressures, no significant valvular abnormalities were noted.  Theodore was referred to EP to discuss possible BiV ICD due to severely decreased LV function.  Theodore was last seen in clinic on 10/12/2022 for follow-up, he reported that he been doing very well.  He noted that for the prior 2 months he had increased lower extremity edema, had Theodore increase his Lasix to 20 mg daily for the next 3 days then to resume only as needed.  Theodore was referred to EP and echocardiogram ordered.   Echocardiogram on 11/30/2023 indicated LVEF 30%, LV moderate to severely decreased function, basal to mid septal thinning and akinesis, apical anterior/inferior/lateral thinning and akinesis, thinning/akinesis of true apex.  No LV thrombus noted, G1 DD, RV systolic function and size was normal, no evidence of mitral valve regurgitation or stenosis, aortic valve regurgitation was not visualized no stenosis present.  Theodore was seen by EP on 12/07/2023, noted to have NYHA I symptoms and not to be a candidate for small device therapy.  Barostim and CCM was discussed in the event he has more symptoms in the future.  Today he presents for follow-up.  He reports that he  Labwork independently reviewed:   ROS: .   *** denies chest pain, shortness of breath, lower extremity edema, fatigue, palpitations, melena, hematuria, hemoptysis, diaphoresis, weakness, presyncope, syncope, orthopnea, and PND.  All other systems are reviewed and otherwise negative.  Studies Reviewed: Theodore    EKG:  EKG is ordered today, personally reviewed, demonstrating  ***     CV Studies: Cardiac studies reviewed are outlined and summarized above. Otherwise please see EMR for full report. Cardiac Studies & Procedures   ______________________________________________________________________________________________ CARDIAC CATHETERIZATION  CARDIAC CATHETERIZATION 09/20/2020  Conclusion Images from the original result were not included.   Prox RCA lesion is 80% stenosed.  Previously placed Prox LAD to Mid LAD stent (unknown type) is widely patent.  Dist Cx lesion is 100% stenosed.  Prox Cx lesion is 30% stenosed.  1st Mrg lesion is 90% stenosed.  2nd Diag lesion is 90% stenosed.  A drug-eluting stent was successfully placed.  Post intervention, there is a 0% residual stenosis.  [name] is a 63 y.o. male   [id number] LOCATION:  FACILITY: [facility] PHYSICIAN: Dorn Lesches, M.D. [birth year]   DATE OF PROCEDURE:  09/20/2020  DATE OF DISCHARGE:     CARDIAC CATHETERIZATION / PCI DES RCA    History obtained from chart review.[name] is a 63 y.o. male with CAD, GERD, DM, asthma, hyperlipidemia. In 2008 he had an anterior MI treated with a drug eluting stent in the LAD. His ejection fraction was 45% at that time. Myoview 2010 showed no ischemia and showed an ejection fraction of 40%. Echo January 2016 with LVEF=40%. Apical hypokinesis. No LV thrombus seen. Echo March 2021 with LVEF=35-40%. He has been on Entresto .  He is being seen 09/20/2020 for the evaluation of VT and chest pain.  He underwent DC cardioversion to sinus rhythm.  He was placed on amiodarone .  His post cardioversion rhythm/EKG revealed Q waves across his precordium with persistent ST segment elevation consistent with LV apical thrombus.  He presents now for diagnostic coronary angiography to rule out an ischemic etiology.  There is intention to implant a ICD.   PROCEDURE DESCRIPTION:  The Theodore was brought to the second floor [facility]iac cath lab in the  postabsorptive state. He was not premedicated . His right wrist was prepped and shaved in usual sterile fashion. Xylocaine  1% was used for local anesthesia. A 6 French sheath was inserted into the right radial artery using standard Seldinger technique. The Theodore received 3500 units  of heparin  intravenously.  5 [place] TIG catheter and pigtail catheters were used for selective coronary angiography and obtain left heart pressures.  Isovue dye is used for the entirety of the case.  Retrograde aortic, ventricular and pullback pressures were recorded.  Radial cocktail was administered via the SideArm sheath.  Theodore received a total of 7500 units of heparin  per ACT of 279.  He received 60 mg of p.o. Plavix  followed by 20 mg of IV Pepcid .  Isovue dye was used for the entirety of the intervention.  Retrograde aortic pressures monitored during the case.  A total of 65 cc of contrast was administered the Theodore.  Radial cocktail was administered via the SideArm sheath.  Using a 6 [place] JR4 guide catheter along with 0.14/190 cm long [place] guidewire and a 2 mm x 12 mm balloon in the proximal RCA was predilated.  Following this a 2.25 x 18 mm long Medtronic resolute Onyx drug-eluting stent was then  carefully positioned and deployed at 14 atm in the proximal RCA across the disease segment.  It was postdilated with a 2.5 mm x 15 mm long noncompliant balloon at 16 atm (2.6 mm) resulting reduction of a 80 to 90% ulcerated proximal RCA stenosis to 0% residual.  Theodore taught procedure well.  The guidewire and catheter were removed.  The sheath was removed and a TR band was placed on the right wrist to achieve patent hemostasis.  The Theodore left lab in stable condition.    Impression Mr Cott presented with VT and chest pain.  His pain resolved with cardioversion.  His enzymes are negative initially.  He does have queues across his precordium with persistent ST segment elevation consistent with apical thrombus  suggesting that his VT may be scar mediated.  His cath revealed widely patent proximal LAD stent with TIMI II flow down the LAD.  He does have a high-grade first marginal branch stenosis at the ostium which was described in the cath report in 2008 and was treated medically at that time.  His distal circumflex involving small posterolateral branches is subtotally occluded as well.  I believe his culprit lesion is an ulcerated proximal dominant RCA which was successfully stented.  I reviewed the films with both with Drs. Croitoru, his attending physician and Dr. Waddell, the electrophysiologist.  He will need implantation of an ICD for secondary prevention of sudden cardiac death given his presentation with ventricular tachycardia.  Dorn Lesches. MD, [facility] 09/20/2020 3:24 PM  Findings Coronary Findings Diagnostic  Dominance: Right  Left Anterior Descending Previously placed Prox LAD to Mid LAD stent (unknown type) is widely patent. Previously placed stent displays no rethrombosis. Previously placed stent displays no restenosis.  Second Diagonal Branch 2nd Diag lesion is 90% stenosed.  Left Circumflex Collaterals Dist Cx filled by collaterals from Mid Cx.  Prox Cx lesion is 30% stenosed. Dist Cx lesion is 100% stenosed.  First Obtuse Marginal Branch 1st Mrg lesion is 90% stenosed.  Right Coronary Artery Prox RCA lesion is 80% stenosed. Vessel is the culprit lesion. The lesion is ulcerative.  Intervention  Prox RCA lesion Stent Lesion crossed with guidewire. Pre-stent angioplasty was performed. A drug-eluting stent was successfully placed. Stent strut is well apposed. Post-stent angioplasty was performed. Post-Intervention Lesion Assessment The intervention was successful. Pre-interventional TIMI flow is 3. Post-intervention TIMI flow is 3. No complications occurred at this lesion. There is a 0% residual stenosis post intervention.     ECHOCARDIOGRAM  ECHOCARDIOGRAM COMPLETE  11/30/2023  Narrative ECHOCARDIOGRAM REPORT    Theodore Name:   BROGHAN Mills Date of Exam: 11/30/2023 Medical Rec #:  [id number]      Height:       68.0 in Accession #:    [id number]     Weight:       150.2 lb Date of Birth:  07-29-1960      BSA:          1.810 m Theodore Age:    62 years       BP:           130/68 mmHg Theodore Gender: M              HR:           68 bpm. Exam Location:  [address]  Procedure: 2D Echo and Intracardiac Opacification Agent (Both Spectral and Color Flow Doppler were utilized during procedure).  Indications:    I50.20* Unspecified systolic (congestive) heart failure  History:  Theodore has prior history of Echocardiogram examinations, most recent 06/16/2022. CAD and Previous Myocardial Infarction, Defibrillator; Risk Factors:Former Smoker, Hypertension, Diabetes and Dyslipidemia. Ischemic cardiomyopathy. Asthma. Ventricular fibrillation.  Sonographer:    Jon Hacker RCS Referring Phys: [id number] Aradia Estey D Tristine Langi  IMPRESSIONS   1. Left ventricular ejection fraction, by estimation, is 30%. The left ventricle has moderate to severely decreased function. The left ventricle demonstrates regional wall motion abnormalities with basal to mid septal thinning and akinesis, apical anterior/inferior/lateral thinning and akinesis, and thinning/akinesis of the true apex. No LV thrombus was noted. Left ventricular diastolic parameters are consistent with Grade I diastolic dysfunction (impaired relaxation). 2. Right ventricular systolic function is normal. The right ventricular size is normal. Tricuspid regurgitation signal is inadequate for assessing PA pressure. 3. The mitral valve is normal in structure. No evidence of mitral valve regurgitation. No evidence of mitral stenosis. 4. The aortic valve is tricuspid. Aortic valve regurgitation is not visualized. No aortic stenosis is present. 5. The inferior vena cava is normal in size with greater than 50%  respiratory variability, suggesting right atrial pressure of 3 mmHg.  FINDINGS Left Ventricle: Left ventricular ejection fraction, by estimation, is 30%. The left ventricle has moderate to severely decreased function. The left ventricle demonstrates regional wall motion abnormalities. The left ventricular internal cavity size was normal in size. There is no left ventricular hypertrophy. Left ventricular diastolic parameters are consistent with Grade I diastolic dysfunction (impaired relaxation).  Right Ventricle: The right ventricular size is normal. No increase in right ventricular wall thickness. Right ventricular systolic function is normal. Tricuspid regurgitation signal is inadequate for assessing PA pressure.  Left Atrium: Left atrial size was normal in size.  Right Atrium: Right atrial size was normal in size.  Pericardium: There is no evidence of pericardial effusion.  Mitral Valve: The mitral valve is normal in structure. No evidence of mitral valve regurgitation. No evidence of mitral valve stenosis.  Tricuspid Valve: The tricuspid valve is normal in structure. Tricuspid valve regurgitation is trivial.  Aortic Valve: The aortic valve is tricuspid. Aortic valve regurgitation is not visualized. No aortic stenosis is present.  Pulmonic Valve: The pulmonic valve was normal in structure. Pulmonic valve regurgitation is not visualized.  Aorta: The aortic root is normal in size and structure.  Venous: The inferior vena cava is normal in size with greater than 50% respiratory variability, suggesting right atrial pressure of 3 mmHg.  IAS/Shunts: No atrial level shunt detected by color flow Doppler.  Additional Comments: A device lead is visualized in the right ventricle.   LEFT VENTRICLE PLAX 2D LVIDd:         5.36 cm   Diastology LVIDs:         3.79 cm   LV e' medial:    6.74 cm/s LV PW:         1.10 cm   LV E/e' medial:  16.3 LV IVS:        0.92 cm   LV e' lateral:   7.07  cm/s LVOT diam:     2.10 cm   LV E/e' lateral: 15.6 LV SV:         66 LV SV Index:   37 LVOT Area:     3.46 cm   RIGHT VENTRICLE RV Basal diam:  2.47 cm RV S prime:     10.40 cm/s TAPSE (M-mode): 0.8 cm  LEFT ATRIUM             Index  RIGHT ATRIUM           Index LA diam:        2.20 cm 1.22 cm/m   RA Area:     11.70 cm LA Vol (A2C):   24.4 ml 13.48 ml/m  RA Volume:   30.20 ml  16.69 ml/m LA Vol (A4C):   23.5 ml 12.99 ml/m LA Biplane Vol: 25.2 ml 13.93 ml/m AORTIC VALVE LVOT Vmax:   90.10 cm/s LVOT Vmean:  59.200 cm/s LVOT VTI:    0.191 m  AORTA Ao Root diam: 3.30 cm Ao Asc diam:  3.00 cm  MITRAL VALVE MV Area (PHT): 3.81 cm     SHUNTS MV Decel Time: 199 msec     Systemic VTI:  0.19 m MV E velocity: 110.00 cm/s  Systemic Diam: 2.10 cm MV A velocity: 119.00 cm/s MV E/A ratio:  0.92  Dalton McleanMD Electronically signed by Ezra Kanner Signature Date/Time: 11/30/2023/4:59:01 PM    Final          ______________________________________________________________________________________________       Current Reported Medications:.    No outpatient medications have been marked as taking for the 12/10/23 encounter (Appointment) with Jolynn Bajorek D, NP.    Physical Exam:    VS:  There were no vitals taken for this visit.   Wt Readings from Last 3 Encounters:  12/07/23 150 lb (68 kg)  11/11/23 150 lb 3.2 oz (68.1 kg)  10/12/23 152 lb (68.9 kg)    GEN: Well nourished, well developed in no acute distress NECK: No JVD; No carotid bruits CARDIAC: ***RRR, no murmurs, rubs, gallops RESPIRATORY:  Clear to auscultation without rales, wheezing or rhonchi  ABDOMEN: Soft, non-tender, non-distended EXTREMITIES:  No edema; No acute deformity     Asessement and Plan:.     ***     Disposition: F/u with ***  Signed, Shawnice Tilmon D Toshi Ishii, NP

## 2023-12-09 NOTE — Telephone Encounter (Signed)
 Received notification from [place] at [facility] that pt's Rx claim is coming back as filled after termination of coverage. Eligibility check brings back nothing for commercial, however for Medicare it states that Coverage is found but states that it is outside of service date. Pt turns 63 on 01/24/24, so presumably his Medicare will become active on that date. We will provide pt a sample to bridge the month in between.  Attempted to contact pt to discuss, left VoiceMail requesting a return call. Direct office number provided.

## 2023-12-10 ENCOUNTER — Ambulatory Visit: Attending: Cardiology | Admitting: Cardiology

## 2023-12-10 DIAGNOSIS — Z95810 Presence of automatic (implantable) cardiac defibrillator: Secondary | ICD-10-CM

## 2023-12-10 DIAGNOSIS — I5020 Unspecified systolic (congestive) heart failure: Secondary | ICD-10-CM

## 2023-12-10 DIAGNOSIS — I2510 Atherosclerotic heart disease of native coronary artery without angina pectoris: Secondary | ICD-10-CM

## 2023-12-10 DIAGNOSIS — I255 Ischemic cardiomyopathy: Secondary | ICD-10-CM

## 2023-12-10 DIAGNOSIS — I10 Essential (primary) hypertension: Secondary | ICD-10-CM

## 2023-12-22 ENCOUNTER — Other Ambulatory Visit: Payer: Self-pay

## 2023-12-22 ENCOUNTER — Other Ambulatory Visit (HOSPITAL_COMMUNITY): Payer: Self-pay

## 2023-12-22 NOTE — Progress Notes (Signed)
 Specialty Pharmacy Refill Coordination Note  [name] is a 63 y.o. male contacted today regarding refills of specialty medication(s) Dupilumab  (Dupixent )   Patient requested [name] at [facility] at [place] date: 12/22/23   Medication will be filled on 12/22/23.

## 2023-12-24 ENCOUNTER — Other Ambulatory Visit: Payer: Self-pay

## 2024-01-11 ENCOUNTER — Other Ambulatory Visit (HOSPITAL_COMMUNITY): Payer: Self-pay

## 2024-01-14 ENCOUNTER — Other Ambulatory Visit: Payer: Self-pay

## 2024-01-14 NOTE — Progress Notes (Signed)
 Specialty Pharmacy Refill Coordination Note  [name] is a 63 y.o. male contacted today regarding refills of specialty medication(s) Dupilumab  (Dupixent )   Patient requested [name] at [facility] at [place] date: 01/15/24   Medication will be filled on 09.25.25.

## 2024-01-18 ENCOUNTER — Encounter: Payer: Self-pay | Admitting: Cardiovascular Disease

## 2024-01-18 ENCOUNTER — Ambulatory Visit: Attending: Cardiovascular Disease | Admitting: Cardiovascular Disease

## 2024-01-18 VITALS — BP 132/72 | HR 68 | Ht 68.0 in | Wt 150.0 lb

## 2024-01-18 DIAGNOSIS — I255 Ischemic cardiomyopathy: Secondary | ICD-10-CM | POA: Diagnosis not present

## 2024-01-18 DIAGNOSIS — I10 Essential (primary) hypertension: Secondary | ICD-10-CM

## 2024-01-18 DIAGNOSIS — I2510 Atherosclerotic heart disease of native coronary artery without angina pectoris: Secondary | ICD-10-CM

## 2024-01-18 DIAGNOSIS — E7800 Pure hypercholesterolemia, unspecified: Secondary | ICD-10-CM

## 2024-01-18 DIAGNOSIS — I5020 Unspecified systolic (congestive) heart failure: Secondary | ICD-10-CM

## 2024-01-18 MED ORDER — VALSARTAN 40 MG PO TABS: 40.0000 mg | ORAL_TABLET | Freq: Every day | ORAL | 3 refills | Status: AC

## 2024-01-18 NOTE — Patient Instructions (Signed)
 Medication Instructions:  Your physician has recommended you make the following change in your medication: Start Valsartan  40 mg by mouth daily   *If you need a refill on your cardiac medications before your next appointment, please call your pharmacy*  Lab Work: none If you have labs (blood work) drawn today and your tests are completely normal, you will receive your results only by: MyChart Message (if you have MyChart) OR A paper copy in the mail If you have any lab test that is abnormal or we need to change your treatment, we will call you to review the results.  Testing/Procedures: none  Follow-Up: At [facility], you and your health needs are our priority.  As part of our continuing mission to provide you with exceptional heart care, our providers are all part of one team.  This team includes your primary Cardiologist (physician) and Advanced Practice Providers or APPs (Physician Assistants and Nurse Practitioners) who all work together to provide you with the care you need, when you need it.  Your next appointment:   12 month(s)  Provider:   Lonni Cash, MD    We recommend signing up for the patient portal called MyChart.  Sign up information is provided on this After Visit Summary.  MyChart is used to connect with patients for Virtual Visits (Telemedicine).  Patients are able to view lab/test results, encounter notes, upcoming appointments, etc.  Non-urgent messages can be sent to your provider as well.   To learn more about what you can do with MyChart, go to [email or web address].   Other Instructions

## 2024-01-18 NOTE — Progress Notes (Signed)
 Chief Complaint  Patient presents with   Follow-up    CAD   History of Present Illness: 63 yo male with history of CAD, VT, HFrEF, ischemic cardiomyopathy, GERD, DM, asthma and hyperlipidemia here today for cardiac follow up. In 2008 he had an anterior MI treated with a drug eluting stent in the LAD. His ejection fraction was 45% at that time. Myoview 2010 showed no ischemia and showed an ejection fraction of 40%. Echo January 2016 with LVEF=40%. Apical hypokinesis. No LV thrombus seen. Echo March 2021 with LVEF=35-40%.  Admitted to [facility] 2022 with sustained VT in the setting of a NSTEMI. The severe RCA stenosis was treated with a drug eluting stent. The LAD stent was patent. Echo 09/21/20 with LVEF=30-35%. No valve disease and no improvement in LVEF several months later. ICD placed in October 2022.  Echo February 2024 with LVEF=25-30%.   He is here today for follow up. The patient denies any chest pain, dyspnea, palpitations, lower extremity edema, orthopnea, PND, dizziness, near syncope or syncope.    Primary Care Physician: Nicholas Bar, MD   Past Medical History:  Diagnosis Date   Asthma    CAD (coronary artery disease)    a. CAD s/p anterior MI in 2008 treated with DES to the LAD.   Cataract 2018   both eyes   Cellulitis of leg, right 10/2014   COPD (chronic obstructive pulmonary disease) (HCC)    Dental caries    Diabetes mellitus    TYPE 2   GERD (gastroesophageal reflux disease)    Hyperlipidemia    Hypertension 2017   Ischemic cardiomyopathy    Leukocytosis    noted on labs   Microcytic anemia    Myocardial infarction [facility]) 2008   Reactive airway disease    Thyroid  nodule    biopsy negative 2006    Past Surgical History:  Procedure Laterality Date   CORONARY STENT INTERVENTION N/A 09/20/2020   Procedure: CORONARY STENT INTERVENTION;  Surgeon: Court Dorn PARAS, MD;  Location: [facility] INVASIVE CV LAB;  Service: Cardiovascular;  Laterality: N/A;   CORONARY STENT  PLACEMENT     ESOPHAGOGASTRODUODENOSCOPY N/A 03/29/2017   Procedure: ESOPHAGOGASTRODUODENOSCOPY (EGD);  Surgeon: Teressa Toribio SQUIBB, MD;  Location[facility] ENDOSCOPY;  Service: Endoscopy;  Laterality: N/A;   ESOPHAGOGASTRODUODENOSCOPY N/A 03/31/2018   Procedure: ESOPHAGOGASTRODUODENOSCOPY (EGD);  Surgeon: Legrand Victory LITTIE DOUGLAS, MD;  Location: [facility] ENDOSCOPY;  Service: Gastroenterology;  Laterality: N/A;   FOREIGN BODY REMOVAL N/A 03/31/2018   Procedure: FOREIGN BODY REMOVAL;  Surgeon: Legrand Victory LITTIE DOUGLAS, MD;  Location: [facility] ENDOSCOPY;  Service: Gastroenterology;  Laterality: N/A;   ICD IMPLANT N/A 02/04/2021   Procedure: ICD IMPLANT;  Surgeon: Waddell Danelle ORN, MD;  Location[facility] INVASIVE CV LAB;  Service: Cardiovascular;  Laterality: N/A;   LEFT HEART CATH AND CORONARY ANGIOGRAPHY N/A 09/20/2020   Procedure: LEFT HEART CATH AND CORONARY ANGIOGRAPHY;  Surgeon: Court Dorn PARAS, MD;  Location: [facility] INVASIVE CV LAB;  Service: Cardiovascular;  Laterality: N/A;   solitary pulm and thryoid nodules        Current Outpatient Medications:    albuterol  (PROVENTIL ) (2.5 MG/3ML) 0.083% nebulizer solution, Take 3 mLs (2.5 mg total) by nebulization every 6 (six) hours as needed for wheezing or shortness of breath., Disp: 75 mL, Rfl: 3   aspirin  EC 81 MG tablet, Take 1 tablet (81 mg total) by mouth daily., Disp: 30 tablet, Rfl: 11   atorvastatin  (LIPITOR ) 80 MG tablet, Take 1 tablet (80 mg total) by  mouth daily., Disp: 90 tablet, Rfl: 3   atovaquone -proguanil (MALARONE ) 250-100 MG TABS tablet, Take 1 tablet by mouth daily., Disp: 60 tablet, Rfl: 0   bisoprolol  (ZEBETA ) 5 MG tablet, Take 0.5 tablets (2.5 mg total) by mouth daily. Disp: 45 tablet, Rfl: 3   calcium -vitamin D  (OSCAL WITH D) 250-125 MG-UNIT tablet, Take 1 tablet by mouth 2 (two) times daily., Disp: , Rfl:    dapagliflozin  propanediol (FARXIGA ) 10 MG TABS tablet, Take 1 tablet (10 mg total) by mouth daily., Disp: 90 tablet, Rfl: 3   Dupilumab  (DUPIXENT ) 300 MG/2ML  SOAJ, Inject 300 mg into the skin every 14 (fourteen) days., Disp: 12 mL, Rfl: 1   ferrous sulfate  325 (65 FE) MG tablet, Take 325 mg by mouth daily with breakfast., Disp: , Rfl:    fluticasone  (FLONASE ) 50 MCG/ACT nasal spray, Place 1 spray into both nostrils daily., Disp: 18.2 mL, Rfl: 2   Fluticasone -Umeclidin-Vilant (TRELEGY ELLIPTA ) 200-62.5-25 MCG/ACT AEPB, INHALE 1 PUFF ONCE DAILY, Disp: 60 each, Rfl: 5   metFORMIN  (GLUCOPHAGE ) 1000 MG tablet, Take 1 tablet (1,000 mg total) by mouth 2 (two) times daily with a meal., Disp: 180 tablet, Rfl: 1   Multiple Vitamin (MULTIVITAMIN WITH MINERALS) TABS tablet, Take 1 tablet by mouth daily., Disp: , Rfl:    nitroGLYCERIN  (NITROSTAT ) 0.4 MG SL tablet, Place 1 tablet (0.4 mg total) under the tongue every 5 (five) minutes x 3 doses as needed for chest pain., Disp: 25 tablet, Rfl: 2   omeprazole  (PRILOSEC OTC) 20 MG tablet, Take 20 mg by mouth daily., Disp: , Rfl:    predniSONE  (DELTASONE ) 10 MG tablet, Take 1 tablet (10 mg total) by mouth daily with breakfast., Disp: 30 tablet, Rfl: 5   albuterol  (PROAIR  HFA) 108 (90 Base) MCG/ACT inhaler, Inhale 2 puffs into the lungs every 4 (four) hours as needed for wheezing or shortness of breath. (Patient not taking: Reported on 01/18/2024), Disp: 1 each, Rfl: 6   loratadine  (CLARITIN ) 10 MG tablet, Take 1 tablet (10 mg total) by mouth daily. (Patient not taking: Reported on 01/18/2024), Disp: 30 tablet, Rfl: 1   promethazine -dextromethorphan (PROMETHAZINE -DM) 6.25-15 MG/5ML syrup, Take 5 mLs by mouth 4 (four) times daily as needed for cough. (Patient not taking: Reported on 01/18/2024), Disp: 180 mL, Rfl: 0   valsartan  (DIOVAN ) 40 MG tablet, Take 1 tablet (40 mg total) by mouth daily., Disp: 90 tablet, Rfl: 3    Allergies  Allergen Reactions   Doxycycline  Rash    Social History   Socioeconomic History   Marital status: Married    Spouse name: Not on file   Number of children: 3   Years of education: Not on  file   Highest education level: Not on file  Occupational History   Not on file  Tobacco Use   Smoking status: Former    Current packs/day: 0.00    Average packs/day: 0.5 packs/day for 22.0 years (11.0 ttl pk-yrs)    Types: Cigarettes    Start date: 04/21/1984    Quit date: 04/21/2006    Years since quitting: 17.7    Passive exposure: Past   Smokeless tobacco: Never   Tobacco comments:    quit 10 years ago  Vaping Use   Vaping status: Never Used  Substance and Sexual Activity   Alcohol use: No    Alcohol/week: 0.0 standard drinks of alcohol   Drug use: No   Sexual activity: Yes    Birth control/protection: Condom  Other Topics Concern  Not on file  Social History Narrative   Lives with wife; recently moved to [place] from [place] due to health problems. Has no means of affording meds currently + tobacco 1/2ppd x 48yrs, no ETOH no drugs.   Social Drivers of Corporate investment banker Strain: High Risk (10/18/2020)   Overall Financial Resource Strain (CARDIA)    Difficulty of Paying Living Expenses: Hard  Food Insecurity: No Food Insecurity (10/18/2020)   Hunger Vital Sign    Worried About Running Out of Food in the Last Year: Never true    Ran Out of Food in the Last Year: Never true  Transportation Needs: No Transportation Needs (10/18/2020)   PRAPARE - Administrator, Civil Service (Medical): No    Lack of Transportation (Non-Medical): No  Physical Activity: Not on file  Stress: Not on file  Social Connections: Not on file  Intimate Partner Violence: Not on file    Family History  Problem Relation Age of Onset   Diabetes Mother    Heart attack Brother 68   Colon cancer Neg Hx    Esophageal cancer Neg Hx    Rectal cancer Neg Hx    Stomach cancer Neg Hx     Review of Systems:  As stated in the HPI and otherwise negative.   BP 132/72   Pulse 68   Ht 5' 8 (1.727 m)   Wt 150 lb (68 kg)   SpO2 95%   BMI 22.81 kg/m   Physical Examination: General: Well  developed, well nourished, NAD  HEENT: OP clear, mucus membranes moist  SKIN: warm, dry. No rashes. Neuro: No focal deficits  Musculoskeletal: Muscle strength 5/5 all ext  Psychiatric: Mood and affect normal  Neck: No JVD, no carotid bruits, no thyromegaly, no lymphadenopathy.  Lungs:Clear bilaterally, no wheezes, rhonci, crackles Cardiovascular: Regular rate and rhythm. No murmurs, gallops or rubs. Abdomen:Soft. Bowel sounds present. Non-tender.  Extremities: No lower extremity edema. Pulses are 2 + in the bilateral DP/PT.   EKG:  EKG is not  ordered today. The ekg ordered today demonstrates   Recent Labs: 10/12/2023: Hemoglobin 15.0; Platelets 201 10/28/2023: ALT 21; BUN 16; Creatinine, Ser 1.10; Potassium 4.2; Sodium 141   Lipid Panel    Component Value Date/Time   CHOL 139 10/28/2023 1126   TRIG 136 10/28/2023 1126   HDL 47 10/28/2023 1126   CHOLHDL 3.0 10/28/2023 1126   CHOLHDL 3.3 09/21/2020 0300   VLDL 20 09/21/2020 0300   LDLCALC 68 10/28/2023 1126   LDLDIRECT 91 08/14/2017 1643   LDLDIRECT 98 07/28/2012 1701     Wt Readings from Last 3 Encounters:  01/18/24 150 lb (68 kg)  12/07/23 150 lb (68 kg)  11/11/23 150 lb 3.2 oz (68.1 kg)    Assessment and Plan:   1. CAD without angina: No chest pain. Continue Continue ASA, statin and beta blocker.       2. Cardiomyopathy, ischemic/HFrEF:  LVEF=30% by echo in August 2025. ICD in place. Continue bisoprolol . He did not tolerate Entresto  due to hypotension. Adding back Diovan  today. Consider adding aldactone at f/u if BP tolerates.   3. HTN: BP is controlled. Continue current meds  4. Hyperlipidemia: LDL 68 in July 2025. Continue statin.    Labs/ tests ordered today include:  No orders of the defined types were placed in this encounter.  Disposition:   F/U with me in 12  months  Signed, Lonni Cash, MD 01/18/2024 11:01 AM    [facility] [address] [place], [place], [place]  [address] Phone:  [phone]; Fax: [phone]

## 2024-01-27 ENCOUNTER — Other Ambulatory Visit (HOSPITAL_COMMUNITY): Payer: Self-pay

## 2024-01-27 NOTE — Telephone Encounter (Signed)
 Patient's insurance has populated  Submitted an URGENT Prior Authorization request to [employer] for DUPIXENT  via CoverMyMeds. Will update once we receive a response.  Key: [id number]

## 2024-01-27 NOTE — Telephone Encounter (Signed)
 Received notification from [employer] regarding a prior authorization for DUPIXENT . Authorization has been APPROVED from 01/27/24 to 07/27/24. Approval letter sent to scan center.  Per test claim, copay for 28 days supply is $1379.85  Patient can fill through [facility]: [phone]   Authorization # [id number]  Patient enrolled into asthma grant through PAF: Award Period: 07/31/2023 - 01/26/2025 ID: [id number] BIN: [id number] PCN: [id number] Group: [id number] For pharmacy inquiries, contact PDMI at [phone]. For patient inquiries, contact PAF at [phone].  Sherry Pennant, PharmD, MPH, BCPS, CPP Clinical Pharmacist [facility])

## 2024-01-27 NOTE — Telephone Encounter (Signed)
 Lorrene info has been placed into [place].

## 2024-01-27 NOTE — Progress Notes (Signed)
 Remote ICD Transmission

## 2024-02-02 ENCOUNTER — Ambulatory Visit

## 2024-02-02 DIAGNOSIS — I255 Ischemic cardiomyopathy: Secondary | ICD-10-CM | POA: Diagnosis not present

## 2024-02-04 ENCOUNTER — Ambulatory Visit: Payer: Self-pay | Admitting: Internal Medicine

## 2024-02-04 LAB — CUP PACEART REMOTE DEVICE CHECK
Battery Remaining Longevity: 138 mo
Battery Remaining Percentage: 98 %
Brady Statistic RV Percent Paced: 0 %
Date Time Interrogation Session: 20251014041200
HighPow Impedance: 78 Ohm
Implantable Lead Connection Status: 753985
Implantable Lead Implant Date: 20221017
Implantable Lead Location: 753860
Implantable Lead Model: 138
Implantable Lead Serial Number: 304145
Implantable Pulse Generator Implant Date: 20221017
Lead Channel Impedance Value: 439 Ohm
Lead Channel Setting Pacing Amplitude: 2.5 V
Lead Channel Setting Pacing Pulse Width: 0.4 ms
Lead Channel Setting Sensing Sensitivity: 0.5 mV
Pulse Gen Serial Number: 213143

## 2024-02-05 NOTE — Progress Notes (Signed)
 Remote ICD Transmission

## 2024-02-09 ENCOUNTER — Other Ambulatory Visit: Payer: Self-pay

## 2024-02-11 ENCOUNTER — Other Ambulatory Visit (HOSPITAL_COMMUNITY): Payer: Self-pay

## 2024-02-11 ENCOUNTER — Other Ambulatory Visit: Payer: Self-pay

## 2024-02-11 NOTE — Progress Notes (Signed)
 Specialty Pharmacy Refill Coordination Note  Spoke with [name]  [name] is a 63 y.o. male contacted today regarding refills of specialty medication(s) Dupilumab  (Dupixent )  Doses on hand: 0   Injection date: 02/16/24  Patient requested: [name] at [facility] at [place] date: 02/12/24  Medication will be filled on 02/11/24.

## 2024-03-02 ENCOUNTER — Other Ambulatory Visit: Payer: Self-pay | Admitting: Pulmonary Disease

## 2024-03-02 DIAGNOSIS — J4489 Other specified chronic obstructive pulmonary disease: Secondary | ICD-10-CM

## 2024-03-02 MED ORDER — TRELEGY ELLIPTA 200-62.5-25 MCG/ACT IN AEPB: 1.0000 | INHALATION_SPRAY | Freq: Every day | RESPIRATORY_TRACT | 2 refills | Status: DC

## 2024-03-02 NOTE — Telephone Encounter (Signed)
 Copied from CRM [id number]. Topic: Clinical - Medication Refill >> Mar 02, 2024  3:49 PM Isabell A wrote: Medication: Fluticasone -Umeclidin-Vilant (TRELEGY ELLIPTA ) 200-62.5-25 MCG/ACT AEPB [[id number]]   predniSONE  (DELTASONE ) 10 MG tablet [[id number]]   Has the patient contacted their pharmacy? Yes (Agent: If no, request that the patient contact the pharmacy for the refill. If patient does not wish to contact the pharmacy document the reason why and proceed with request.) (Agent: If yes, when and what did the pharmacy advise?)  This is the patient's preferred pharmacy:  [facility] [address] [place] - [address] [place] [address] Phone: [phone] Fax: [phone]   Is this the correct pharmacy for this prescription? Yes If no, delete pharmacy and type the correct one.   Has the prescription been filled recently? Yes  Is the patient out of the medication? Yes  Has the patient been seen for an appointment in the last year OR does the patient have an upcoming appointment? Yes  Can we respond through MyChart? No  Agent: Please be advised that Rx refills may take up to 3 business days. We ask that you follow-up with your pharmacy.

## 2024-03-04 ENCOUNTER — Other Ambulatory Visit (HOSPITAL_COMMUNITY): Payer: Self-pay

## 2024-03-07 ENCOUNTER — Other Ambulatory Visit (HOSPITAL_COMMUNITY): Payer: Self-pay

## 2024-03-09 ENCOUNTER — Other Ambulatory Visit: Payer: Self-pay | Admitting: Pharmacy Technician

## 2024-03-09 ENCOUNTER — Other Ambulatory Visit: Payer: Self-pay

## 2024-03-09 NOTE — Progress Notes (Signed)
 Specialty Pharmacy Refill Coordination Note  [name] is a 63 y.o. male contacted today regarding refills of specialty medication(s) Dupilumab  (Dupixent )   Patient requested [name] at [facility] at [place] date: 03/11/24   Medication will be filled on: 03/10/24

## 2024-03-14 ENCOUNTER — Telehealth: Payer: Self-pay

## 2024-03-14 DIAGNOSIS — J4489 Other specified chronic obstructive pulmonary disease: Secondary | ICD-10-CM

## 2024-03-14 MED ORDER — ALBUTEROL SULFATE HFA 108 (90 BASE) MCG/ACT IN AERS: 2.0000 | INHALATION_SPRAY | RESPIRATORY_TRACT | 6 refills | Status: AC | PRN

## 2024-03-14 NOTE — Telephone Encounter (Signed)
 Copied from CRM #[id number]. Topic: Clinical - Prescription Issue >> Mar 14, 2024  2:33 PM [name] wrote: Reason for CRM: Patient states his pharmacy still does not have the prescription for TRELEGY, please send this to: [facility] [address] [place] - [address] [place] [address] Phone: [phone]  Fax: [phone] Patient is also inquiring about samples today as he is completely out of this medication.   Called and spoke with the pt and advised we do not have any samples, and pt also stated insurance will not cover Trelegy inhaler. Albuterol  refilled per pt request. Pharmacy please advise PA for Trelegy.

## 2024-03-15 ENCOUNTER — Other Ambulatory Visit (HOSPITAL_BASED_OUTPATIENT_CLINIC_OR_DEPARTMENT_OTHER): Payer: Self-pay

## 2024-03-15 ENCOUNTER — Other Ambulatory Visit (HOSPITAL_COMMUNITY): Payer: Self-pay

## 2024-03-15 ENCOUNTER — Telehealth: Payer: Self-pay

## 2024-03-15 MED ORDER — TRELEGY ELLIPTA 200-62.5-25 MCG/ACT IN AEPB
1.0000 | INHALATION_SPRAY | Freq: Every day | RESPIRATORY_TRACT | 2 refills | Status: DC
  Filled 2024-03-15: qty 60, 30d supply, fill #0
  Filled 2024-04-20: qty 60, 30d supply, fill #1

## 2024-03-15 NOTE — Telephone Encounter (Signed)
 Pt is aware and rx sent to [facility]. Nfn

## 2024-03-15 NOTE — Telephone Encounter (Signed)
*  Pulm  Pharmacy Patient Advocate Encounter   Received notification from Pt Calls Messages that prior authorization for Trelegy is required/requested.   Insurance verification completed.   The patient is insured through [facility].   Per test claim: The current 30 day co-pay is, $0.00.  No PA needed at this time. This test claim was processed through [facility]- copay amounts may vary at other pharmacies due to pharmacy/plan contracts, or as the patient moves through the different stages of their insurance plan.

## 2024-03-16 NOTE — Telephone Encounter (Signed)
 PA not needed

## 2024-03-31 ENCOUNTER — Other Ambulatory Visit: Payer: Self-pay | Admitting: Internal Medicine

## 2024-03-31 ENCOUNTER — Other Ambulatory Visit: Payer: Self-pay

## 2024-03-31 ENCOUNTER — Other Ambulatory Visit: Payer: Self-pay | Admitting: Pharmacist

## 2024-03-31 DIAGNOSIS — J4489 Other specified chronic obstructive pulmonary disease: Secondary | ICD-10-CM

## 2024-03-31 NOTE — Progress Notes (Signed)
 Specialty Pharmacy Ongoing Clinical Assessment Note  [name] is a 63 y.o. male who is being followed by the specialty pharmacy service for RxSp Asthma/COPD   Patient's specialty medication(s) reviewed today: Dupilumab  (Dupixent )   Missed doses in the last 4 weeks: 0   Patient/Caregiver did not have any additional questions or concerns.   Therapeutic benefit summary: Patient is achieving benefit   Adverse events/side effects summary: No adverse events/side effects   Patient's therapy is appropriate to: Continue    Goals Addressed             This Visit's Progress    Reduce disease symptoms including coughing and shortness of breath   On track    Patient is on track. Patient will maintain adherence         Follow up: 12 months  Lyle LELON Chalk Specialty Pharmacist

## 2024-03-31 NOTE — Progress Notes (Signed)
 Specialty Pharmacy Refill Coordination Note  [name] is a 63 y.o. male contacted today regarding refills of specialty medication(s) Dupilumab  (Dupixent )   Patient requested [name] at [facility] at [place] date: 04/05/24   Medication will be filled on: 04/04/24   This fill date is pending response to refill request from provider. Patient is aware and if they have not received fill by intended date they must follow up with pharmacy.

## 2024-04-01 ENCOUNTER — Other Ambulatory Visit: Payer: Self-pay

## 2024-04-01 MED ORDER — DUPIXENT 300 MG/2ML ~~LOC~~ SOAJ
300.0000 mg | SUBCUTANEOUS | 1 refills | Status: AC
  Filled 2024-04-01 – 2024-04-20 (×2): qty 4, 28d supply, fill #0
  Filled 2024-05-13 – 2024-05-26 (×7): qty 4, 28d supply, fill #1

## 2024-04-01 NOTE — Telephone Encounter (Signed)
 Refill sent for DUPIXENT  to [facility]: [phone]   Dose: 300mg  [name] every 14 days  Last OV: 11/11/23 Provider: Dr. Darlean  Next OV: 05/09/24  [name], PharmD, BCPS Clinical Pharmacist  [facility]

## 2024-04-04 ENCOUNTER — Other Ambulatory Visit: Payer: Self-pay

## 2024-04-11 ENCOUNTER — Other Ambulatory Visit (HOSPITAL_COMMUNITY): Payer: Self-pay

## 2024-04-20 ENCOUNTER — Other Ambulatory Visit (HOSPITAL_COMMUNITY): Payer: Self-pay

## 2024-04-20 ENCOUNTER — Other Ambulatory Visit: Payer: Self-pay

## 2024-04-20 NOTE — Progress Notes (Signed)
 Specialty Pharmacy Refill Coordination Note  [name] is a 63 y.o. male contacted today regarding refills of specialty medication(s) Dupilumab  (Dupixent )   Patient requested [name] at [facility] at [place] date: 04/22/24   Medication will be filled on: 04/20/24

## 2024-05-03 ENCOUNTER — Ambulatory Visit: Attending: Internal Medicine

## 2024-05-03 DIAGNOSIS — I255 Ischemic cardiomyopathy: Secondary | ICD-10-CM | POA: Diagnosis not present

## 2024-05-04 LAB — CUP PACEART REMOTE DEVICE CHECK
Battery Remaining Longevity: 126 mo
Battery Remaining Percentage: 91 %
Brady Statistic RV Percent Paced: 0 %
Date Time Interrogation Session: 20260113041100
HighPow Impedance: 67 Ohm
Implantable Lead Connection Status: 753985
Implantable Lead Implant Date: 20221017
Implantable Lead Location: 753860
Implantable Lead Model: 138
Implantable Lead Serial Number: 304145
Implantable Pulse Generator Implant Date: 20221017
Lead Channel Impedance Value: 416 Ohm
Lead Channel Setting Pacing Amplitude: 2.5 V
Lead Channel Setting Pacing Pulse Width: 0.4 ms
Lead Channel Setting Sensing Sensitivity: 0.5 mV
Pulse Gen Serial Number: 213143

## 2024-05-04 NOTE — Progress Notes (Deleted)
 Remote ICD Transmission

## 2024-05-09 ENCOUNTER — Other Ambulatory Visit (HOSPITAL_COMMUNITY): Payer: Self-pay

## 2024-05-09 ENCOUNTER — Telehealth: Payer: Self-pay | Admitting: Primary Care

## 2024-05-09 ENCOUNTER — Ambulatory Visit: Admitting: Primary Care

## 2024-05-09 ENCOUNTER — Encounter: Payer: Self-pay | Admitting: Primary Care

## 2024-05-09 VITALS — BP 120/70 | HR 77 | Temp 97.6°F | Ht 68.0 in | Wt 155.2 lb

## 2024-05-09 DIAGNOSIS — J4489 Other specified chronic obstructive pulmonary disease: Secondary | ICD-10-CM

## 2024-05-09 DIAGNOSIS — Z87891 Personal history of nicotine dependence: Secondary | ICD-10-CM

## 2024-05-09 DIAGNOSIS — Z23 Encounter for immunization: Secondary | ICD-10-CM

## 2024-05-09 MED ORDER — TRELEGY ELLIPTA 200-62.5-25 MCG/ACT IN AEPB: 1.0000 | INHALATION_SPRAY | Freq: Every day | RESPIRATORY_TRACT | 5 refills | Status: AC

## 2024-05-09 MED ORDER — PREDNISONE 10 MG PO TABS: 10.0000 mg | ORAL_TABLET | Freq: Every day | ORAL | 5 refills | Status: AC

## 2024-05-09 MED ORDER — ALBUTEROL SULFATE (2.5 MG/3ML) 0.083% IN NEBU: 2.5000 mg | INHALATION_SOLUTION | Freq: Four times a day (QID) | RESPIRATORY_TRACT | 3 refills | Status: AC | PRN

## 2024-05-09 NOTE — Addendum Note (Signed)
 Addended by: Laureen Frederic T on: 05/09/2024 11:38 AM   Modules accepted: Orders

## 2024-05-09 NOTE — Progress Notes (Signed)
 "  @Patient  ID: [name], male    DOB: [birth year], 64 y.o.   MRN: [id number]  No chief complaint on file.   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 He is a patient of Dr. Chari and last seen in office 09/23/2021. Past medical history significant for CAD, HFrEF, cardiomyopathy s/p ICD, HTN, GERD, chronic sinusitis, HLD, recurrent otitis with perforated tympanic membrane.   Previous [facility] pulmonary encounter:  09/23/2021: Ov with Dr. Darlean. Maintained on Trelegy and prednisone  10 mg daily. Doing well. Has not had to use albuterol  recently. Advised to start tapering prednisone  to target of 2.5 mg per day. Continue dupixent .   02/28/2022: Today-follow-up Patient presents today for follow-up.  He is also having some acute symptoms.  He has had increased nasal congestion and white to yellow drainage over the past month to month and a half.  He is also had some pus colored drainage coming from his right ear.  He has had issues in the past with recurrent otitis and was previously followed by Dr. Ethyl but has not seen him in quite some time.  Fortunately, his breathing is stable.  He does not have any increased shortness of breath, wheezing or chest congestion.  Does have a minimal cough, which is unchanged from his baseline.  Denies any fevers, chills, headaches, sore throats, changes in hearing.  No viral testing or known sick exposures.  He continues on Trelegy daily.  He was only having to use his albuterol  once a week but since he had increased nasal congestion, he has been using it once a day in the morning.  Not currently on any nasal sprays.  Dupixent  injection is due today.  He tried to decrease prednisone  to 5 mg.  Felt like his breathing got worse so he is back at 10 mg daily.  He tries at least once a month to start tapering.  04/04/2022  He is feeling better but still has nasal congestion,  symptoms are worse in the morning. He has difficulty breathing through his nose at night. He is fine during the daytime. He still has a small amount of drainage from right ear. He has no ear pain. He uses saline nasal rinses 1-2 times a day. He is taking trelegy, prednisone  10mg  daily and dupixent  as directed.   01/20/2023 Patient presents today for acute OV. He has had a dry cough for 3 weeks. Not currently taking anything over the counter for cough. No associated PND, reflux symptoms, shortness of breath, chest tightness or wheezing. He is getting up clear mucus mostly in the morning. He is taking Trelegy 200mcg once daily and dupixent  injections as directed. He ran our of prednisone  1 week ago.    11/11/2023 Discussed the use of AI scribe software for clinical note transcription with the patient, who gave verbal consent to proceed.  History of Present Illness   [name] is a 64 year old male with COPD and asthma who presents for a check-in regarding his Dupixent  treatment. Prior auth expired on June 11th, medication has been approved through 04/11/24.   He missed a month of Dupixent  injections but restarted the treatment approximately two weeks ago. His last injection was on November 02, 2023, and he is on a bi-weekly schedule, with the next dose due on the upcoming Monday. When he misses a dose of Dupixent , he experiences increased wheezing and chest tightness. He has not  experienced any asthma flare-ups requiring prednisone  and reports that his symptoms are well controlled with the current regimen.  He is currently using Trelegy inhaler once daily and has not needed to use his albuterol  rescue inhaler for the past six months. He confirms receiving assistance for the Trelegy medication. In the past four weeks, asthma has only slightly limited his activities, with no shortness of breath, nighttime awakenings, or need for rescue inhalers. He rates his asthma control as 'completely controlled'.  He  mentions a previous heart stress test where he needed to use his inhaler due to difficulty breathing during the test. He is no longer taking Farxiga  as per his cardiologist's advice and is currently only on aspirin  for cardiac management. No recent hospitalizations, cough, or need for rescue inhalers in the past four weeks. No shortness of breath or asthma symptoms waking him at night.       05/09/2024- Interim hx  Discussed the use of AI scribe software for clinical note transcription with the patient, who gave verbal consent to proceed.  History of Present Illness [name] is a 64 year old male with COPD and asthma who presents for follow-up of his respiratory conditions.  He has COPD and asthma, managed with Dupixent  injections every two weeks and Trelegy inhaler 200mcg. Approximately one month ago, he experienced a respiratory infection characterized by cough and shortness of breath, which he managed with over-the-counter medications. He ran out of prednisone , which he takes every other day at a dose of 10 mg, and used his rescue inhaler twice during this period, along with steam inhalation for relief.  Currently, he has no wheezing, chest tightness, or cough. He experiences shortness of breath when walking up hills or stairs, which limits his activities at home, though he can perform tasks such as laundry and making his bed. No fear of leaving his home due to his lung condition and he reports sleeping well at night. His energy levels are described as 'okay'.  He receives his Dupixent  injections at [facility] and has experienced some delays in his injection schedule due to changes in his medication supply, which has shifted from a 90-day to a 30-day supply. This has occasionally resulted in him being late for his injections by three to four days.  He has not received his flu shot this year. He typically receives his flu shot annually.  ACT score 22;CAT score  9  Allergies[1]  Immunization History  Administered Date(s) Administered   Hepatitis A, Adult 03/21/2015   Hepatitis B, ADULT 03/21/2015   Influenza Split 03/21/2011, 12/21/2011, 01/19/2013, 01/20/2015   Influenza Whole 01/20/2008, 01/30/2009   Influenza, Seasonal, Injecte, Preservative Fre 05/22/2023   Influenza,inj,Quad PF,6+ Mos 12/21/2015, 05/06/2016, 01/05/2018, 04/27/2019, 01/10/2020   MMR 03/21/2015   Meningococcal Mcv4o 05/06/2016   PFIZER Comirnaty(Gray Top)Covid-19 Tri-Sucrose Vaccine 08/07/2020   PFIZER(Purple Top)SARS-COV-2 Vaccination 01/10/2020, 02/07/2020   Pneumococcal Polysaccharide-23 01/30/2009, 08/14/2017   Td 05/27/2004   Tdap 02/27/2015    Past Medical History:  Diagnosis Date   Asthma    CAD (coronary artery disease)    a. CAD s/p anterior MI in 2008 treated with DES to the LAD.   Cataract 2018   both eyes   Cellulitis of leg, right 10/2014   COPD (chronic obstructive pulmonary disease) (HCC)    Dental caries    Diabetes mellitus    TYPE 2   GERD (gastroesophageal reflux disease)    Hyperlipidemia    Hypertension 2017   Ischemic cardiomyopathy  Leukocytosis    noted on labs   Microcytic anemia    Myocardial infarction [facility]) 2008   Reactive airway disease    Thyroid  nodule    biopsy negative 2006    Tobacco History: Tobacco Use History[2] Counseling given: Not Answered Tobacco comments: quit 10 years ago  Review of Systems  Review of Systems  Constitutional: Negative.   Respiratory:  Negative for cough, shortness of breath and wheezing.    Physical Exam  There were no vitals taken for this visit. Physical Exam Constitutional:      Appearance: Normal appearance. He is well-developed.  HENT:     Head: Normocephalic and atraumatic.     Mouth/Throat:     Mouth: Mucous membranes are moist.     Pharynx: Oropharynx is clear.  Cardiovascular:     Rate and Rhythm: Normal rate and regular rhythm.     Heart sounds: Normal heart sounds.   Pulmonary:     Effort: Pulmonary effort is normal. No respiratory distress.     Breath sounds: Normal breath sounds. No wheezing or rhonchi.  Musculoskeletal:        General: Normal range of motion.     Cervical back: Normal range of motion and neck supple.  Skin:    General: Skin is warm and dry.     Findings: No erythema or rash.  Neurological:     General: No focal deficit present.     Mental Status: He is alert and oriented to person, place, and time. Mental status is at baseline.  Psychiatric:        Mood and Affect: Mood normal.        Behavior: Behavior normal.        Thought Content: Thought content normal.        Judgment: Judgment normal.    Lab Results:  CBC    Component Value Date/Time   WBC 11.8 (H) 10/12/2023 1452   WBC 15.7 (H) 02/11/2021 0851   RBC 6.06 (H) 10/12/2023 1452   RBC 5.95 (H) 02/11/2021 0851   HGB 15.0 10/12/2023 1452   HGB 13.3 07/05/2014 1431   HCT 48.6 10/12/2023 1452   HCT 40.1 07/05/2014 1431   PLT 201 10/12/2023 1452   MCV 80 10/12/2023 1452   MCV 72 (L) 07/05/2014 1431   MCH 24.8 (L) 10/12/2023 1452   MCH 23.7 (L) 02/11/2021 0851   MCHC 30.9 (L) 10/12/2023 1452   MCHC 30.9 02/11/2021 0851   RDW 13.9 10/12/2023 1452   RDW 14.4 07/05/2014 1431   LYMPHSABS 3.3 02/11/2021 0851   LYMPHSABS 1.5 02/01/2021 1405   LYMPHSABS 1.3 07/05/2014 1431   MONOABS 1.2 (H) 02/11/2021 0851   EOSABS 0.7 (H) 02/11/2021 0851   EOSABS 0.2 02/01/2021 1405   EOSABS 0.1 07/05/2014 1431   BASOSABS 0.1 02/11/2021 0851   BASOSABS 0.1 02/01/2021 1405   BASOSABS 0.1 07/05/2014 1431    BMET    Component Value Date/Time   NA 141 10/28/2023 1126   K 4.2 10/28/2023 1126   CL 104 10/28/2023 1126   CO2 21 10/28/2023 1126   GLUCOSE 104 (H) 10/28/2023 1126   GLUCOSE 97 02/11/2021 0851   BUN 16 10/28/2023 1126   CREATININE 1.10 10/28/2023 1126   CREATININE 0.98 09/17/2012 1602   CALCIUM  9.3 10/28/2023 1126   GFRNONAA >60 02/11/2021 0851   GFRAA 83  03/21/2020 1019    BNP No results found for: BNP  ProBNP    Component Value Date/Time   PROBNP  131.0 (H) 02/20/2007 0425    Imaging: CUP PACEART REMOTE DEVICE CHECK Result Date: 05/04/2024 ICD Scheduled remote reviewed. Normal device function.  Presenting rhythm: Regular VS Next remote transmission per protocol. LA, CVRS    Assessment & Plan:   1. COPD with asthma (HCC) (Primary)  Assessment and Plan Assessment & Plan Asthma-COPD overlap syndrome Well-controlled with Dupixent  and Trelegy inhaler. Recent respiratory infection in December resolved with over-the-counter medication and prednisone . No current wheezing, chest tightness, or cough. Mild symptom burden with CAT score of 9 and ACT score of 22. Dupixent  administered every two weeks, but supply issues have led to delays of 3-4 days. Prednisone  taken every other day to manage symptoms. - Refilled prednisone  10 mg every other day. - Refilled Trelegy inhaler 200 mcg, one puff daily. - Prescribed nebulized albuterol  q6 hours to use during flare-ups. - Will discuss with pharmacist about obtaining a 90-day supply of Dupixent .  Immunization management Has not received the flu shot this year. Typically receives the flu shot in the fall. - Administered high dose flu shot today. - Advised on potential side effects such as arm soreness, low-grade fever, and flu-like symptoms. - Recommended Tylenol  for soreness and heat application for relief.  Almarie LELON Ferrari, NP 05/09/2024     [1]  Allergies Allergen Reactions   Doxycycline  Rash  [2]  Social History Tobacco Use  Smoking Status Former   Current packs/day: 0.00   Average packs/day: 0.5 packs/day for 22.0 years (11.0 ttl pk-yrs)   Types: Cigarettes   Start date: 04/21/1984   Quit date: 04/21/2006   Years since quitting: 18.0   Passive exposure: Past  Smokeless Tobacco Never  Tobacco Comments   quit 10 years ago   "

## 2024-05-09 NOTE — Telephone Encounter (Signed)
 Can patient receive 90 day supple of dupixent  or will insurance only cover 30 days?

## 2024-05-09 NOTE — Patient Instructions (Addendum)
" °  VISIT SUMMARY: During your visit today, we discussed the management of your asthma-COPD overlap syndrome and addressed your recent respiratory infection. We also reviewed your immunization status and administered your flu shot.  YOUR PLAN: -ASTHMA-COPD OVERLAP SYNDROME: Asthma-COPD overlap syndrome is a condition where a person has symptoms of both asthma and chronic obstructive pulmonary disease (COPD). Your condition is well-controlled with Dupixent  injections and Trelegy inhaler. We refilled your prednisone  prescription to be taken every other day and your Trelegy inhaler to be used once daily. We also prescribed nebulized albuterol  for flare-ups. We will discuss with your pharmacist about obtaining a 90-day supply of Dupixent  to avoid delays in your injections.  -IMMUNIZATION MANAGEMENT: You have not received your flu shot this year, which is important for preventing influenza. We administered your flu shot today and advised you on potential side effects such as arm soreness, low-grade fever, and flu-like symptoms. We recommended taking Tylenol  for soreness and applying heat for relief.  INSTRUCTIONS: Please continue taking your medications as prescribed. Use your Trelegy inhaler once daily and take prednisone  every other day. Use nebulized albuterol  for any flare-ups. We will work on obtaining a 90-day supply of Dupixent  to avoid delays. If you experience any side effects from the flu shot, such as arm soreness or low-grade fever, you can take Tylenol  and apply heat for relief. Please schedule a follow-up appointment in three months to review your condition and treatment plan.  Orders: High dose flu shot Refill sent to pharmacy   Follow-up 6 months with Dr. Darlean  "

## 2024-05-09 NOTE — Progress Notes (Signed)
 Remote ICD Transmission

## 2024-05-10 ENCOUNTER — Other Ambulatory Visit (HOSPITAL_COMMUNITY): Payer: Self-pay

## 2024-05-10 NOTE — Telephone Encounter (Signed)
 Insurance will only cover 1 month at a time

## 2024-05-13 ENCOUNTER — Other Ambulatory Visit: Payer: Self-pay

## 2024-05-17 ENCOUNTER — Other Ambulatory Visit: Payer: Self-pay

## 2024-05-18 ENCOUNTER — Other Ambulatory Visit: Payer: Self-pay

## 2024-05-19 ENCOUNTER — Other Ambulatory Visit: Payer: Self-pay | Admitting: Family Medicine

## 2024-05-19 DIAGNOSIS — E118 Type 2 diabetes mellitus with unspecified complications: Secondary | ICD-10-CM

## 2024-05-24 ENCOUNTER — Other Ambulatory Visit: Payer: Self-pay

## 2024-05-24 NOTE — Progress Notes (Unsigned)
 LVM with pt to discuss M3P

## 2024-05-25 ENCOUNTER — Other Ambulatory Visit (HOSPITAL_COMMUNITY): Payer: Self-pay

## 2024-05-26 ENCOUNTER — Other Ambulatory Visit (HOSPITAL_COMMUNITY): Payer: Self-pay

## 2024-05-26 ENCOUNTER — Other Ambulatory Visit: Payer: Self-pay

## 2024-05-26 NOTE — Progress Notes (Signed)
 Specialty Pharmacy Refill Coordination Note  [name] is a 64 y.o. male contacted today regarding refills of specialty medication(s) Dupilumab  (Dupixent )   Patient requested [name] at [facility] [place] date: 05/27/24   Medication will be filled on: 05/27/24

## 2024-08-02 ENCOUNTER — Encounter

## 2024-11-01 ENCOUNTER — Encounter
